# Patient Record
Sex: Female | Born: 1950
Health system: Southern US, Community
[De-identification: ages and names within clinical notes are randomized; demographics above are authoritative.]

## PROBLEM LIST (undated history)

## (undated) DIAGNOSIS — R011 Cardiac murmur, unspecified: Secondary | ICD-10-CM

## (undated) DIAGNOSIS — I10 Essential (primary) hypertension: Secondary | ICD-10-CM

## (undated) DIAGNOSIS — I480 Paroxysmal atrial fibrillation: Secondary | ICD-10-CM

## (undated) DIAGNOSIS — Z860100 Personal history of colon polyps, unspecified: Secondary | ICD-10-CM

## (undated) DIAGNOSIS — C189 Malignant neoplasm of colon, unspecified: Secondary | ICD-10-CM

## (undated) DIAGNOSIS — E785 Hyperlipidemia, unspecified: Secondary | ICD-10-CM

## (undated) DIAGNOSIS — R945 Abnormal results of liver function studies: Secondary | ICD-10-CM

## (undated) HISTORY — DX: Abnormal results of liver function studies: R94.5

## (undated) HISTORY — PX: COLON SURGERY: SHX602

## (undated) HISTORY — PX: APPENDECTOMY: SHX54

## (undated) HISTORY — DX: Paroxysmal atrial fibrillation: I48.0

## (undated) HISTORY — DX: Cardiac murmur, unspecified: R01.1

## (undated) HISTORY — DX: Essential (primary) hypertension: I10

## (undated) HISTORY — PX: CORONARY ANGIOPLASTY: SHX604

## (undated) HISTORY — DX: Hyperlipidemia, unspecified: E78.5

## (undated) HISTORY — DX: Malignant neoplasm of colon, unspecified: C18.9

---
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
## 1982-05-05 HISTORY — PX: ABDOMINAL HYSTERECTOMY: SHX81

## 1999-05-06 HISTORY — PX: COLONOSCOPY W/ POLYPECTOMY: SHX1380

## 2004-06-25 ENCOUNTER — Ambulatory Visit: Payer: Self-pay | Admitting: Internal Medicine

## 2005-08-27 ENCOUNTER — Ambulatory Visit: Payer: Self-pay | Admitting: Internal Medicine

## 2005-09-19 ENCOUNTER — Ambulatory Visit: Payer: Self-pay | Admitting: Oncology

## 2005-09-26 ENCOUNTER — Ambulatory Visit: Payer: Self-pay | Admitting: Oncology

## 2005-10-03 ENCOUNTER — Ambulatory Visit: Payer: Self-pay | Admitting: Oncology

## 2005-10-14 ENCOUNTER — Ambulatory Visit: Payer: Self-pay | Admitting: Gastroenterology

## 2005-11-02 ENCOUNTER — Ambulatory Visit: Payer: Self-pay | Admitting: Oncology

## 2005-12-03 ENCOUNTER — Inpatient Hospital Stay: Payer: Self-pay | Admitting: Surgery

## 2006-01-19 ENCOUNTER — Ambulatory Visit: Payer: Self-pay | Admitting: Oncology

## 2006-02-02 ENCOUNTER — Ambulatory Visit: Payer: Self-pay | Admitting: Oncology

## 2006-07-16 ENCOUNTER — Ambulatory Visit: Payer: Self-pay | Admitting: Oncology

## 2006-08-04 ENCOUNTER — Ambulatory Visit: Payer: Self-pay | Admitting: Oncology

## 2006-09-01 ENCOUNTER — Ambulatory Visit: Payer: Self-pay | Admitting: Internal Medicine

## 2007-01-04 ENCOUNTER — Ambulatory Visit: Payer: Self-pay | Admitting: Oncology

## 2007-01-14 ENCOUNTER — Ambulatory Visit: Payer: Self-pay | Admitting: Oncology

## 2007-02-03 ENCOUNTER — Ambulatory Visit: Payer: Self-pay | Admitting: Oncology

## 2007-07-04 ENCOUNTER — Ambulatory Visit: Payer: Self-pay | Admitting: Oncology

## 2007-07-12 ENCOUNTER — Ambulatory Visit: Payer: Self-pay | Admitting: Internal Medicine

## 2007-07-13 ENCOUNTER — Ambulatory Visit: Payer: Self-pay | Admitting: Oncology

## 2007-08-04 ENCOUNTER — Ambulatory Visit: Payer: Self-pay | Admitting: Oncology

## 2007-10-04 ENCOUNTER — Ambulatory Visit: Payer: Self-pay | Admitting: Oncology

## 2007-11-03 ENCOUNTER — Ambulatory Visit: Payer: Self-pay | Admitting: Oncology

## 2007-11-12 ENCOUNTER — Ambulatory Visit: Payer: Self-pay | Admitting: Internal Medicine

## 2007-12-13 ENCOUNTER — Ambulatory Visit: Payer: Self-pay | Admitting: Gastroenterology

## 2008-02-02 ENCOUNTER — Ambulatory Visit: Payer: Self-pay | Admitting: Oncology

## 2008-02-03 ENCOUNTER — Ambulatory Visit: Payer: Self-pay | Admitting: Oncology

## 2008-09-02 ENCOUNTER — Ambulatory Visit: Payer: Self-pay | Admitting: Oncology

## 2008-09-07 ENCOUNTER — Ambulatory Visit: Payer: Self-pay | Admitting: Oncology

## 2008-10-03 ENCOUNTER — Ambulatory Visit: Payer: Self-pay | Admitting: Oncology

## 2008-11-14 ENCOUNTER — Ambulatory Visit: Payer: Self-pay | Admitting: Internal Medicine

## 2009-03-05 ENCOUNTER — Ambulatory Visit: Payer: Self-pay | Admitting: Oncology

## 2009-03-15 ENCOUNTER — Ambulatory Visit: Payer: Self-pay | Admitting: Oncology

## 2009-04-04 ENCOUNTER — Ambulatory Visit: Payer: Self-pay | Admitting: Oncology

## 2009-09-02 ENCOUNTER — Ambulatory Visit: Payer: Self-pay | Admitting: Oncology

## 2009-09-12 ENCOUNTER — Ambulatory Visit: Payer: Self-pay | Admitting: Oncology

## 2009-10-03 ENCOUNTER — Ambulatory Visit: Payer: Self-pay | Admitting: Oncology

## 2009-11-16 ENCOUNTER — Ambulatory Visit: Payer: Self-pay | Admitting: Internal Medicine

## 2009-11-29 ENCOUNTER — Ambulatory Visit: Payer: Self-pay | Admitting: Internal Medicine

## 2010-01-18 ENCOUNTER — Ambulatory Visit: Payer: Self-pay | Admitting: Gastroenterology

## 2010-01-21 LAB — PATHOLOGY REPORT

## 2010-03-15 ENCOUNTER — Ambulatory Visit: Payer: Self-pay | Admitting: Oncology

## 2010-03-17 LAB — CEA: CEA: 4.4 ng/mL (ref 0.0–4.7)

## 2010-04-04 ENCOUNTER — Ambulatory Visit: Payer: Self-pay | Admitting: Oncology

## 2010-09-13 ENCOUNTER — Ambulatory Visit: Payer: Self-pay | Admitting: Oncology

## 2010-09-14 LAB — CEA: CEA: 5.1 ng/mL — ABNORMAL HIGH (ref 0.0–4.7)

## 2010-10-04 ENCOUNTER — Ambulatory Visit: Payer: Self-pay | Admitting: Oncology

## 2010-11-18 ENCOUNTER — Ambulatory Visit: Payer: Self-pay | Admitting: Internal Medicine

## 2010-11-18 LAB — HM MAMMOGRAPHY

## 2011-09-15 ENCOUNTER — Ambulatory Visit: Payer: Self-pay | Admitting: Oncology

## 2011-09-15 LAB — HEPATIC FUNCTION PANEL A (ARMC)
Albumin: 3.9 g/dL (ref 3.4–5.0)
Alkaline Phosphatase: 84 U/L (ref 50–136)
Bilirubin, Direct: 0.1 mg/dL (ref 0.00–0.20)
Bilirubin,Total: 0.5 mg/dL (ref 0.2–1.0)
SGOT(AST): 20 U/L (ref 15–37)
SGPT (ALT): 46 U/L
Total Protein: 7.2 g/dL (ref 6.4–8.2)

## 2011-09-16 LAB — CEA: CEA: 4.2 ng/mL (ref 0.0–4.7)

## 2011-10-04 ENCOUNTER — Ambulatory Visit: Payer: Self-pay | Admitting: Oncology

## 2011-11-19 ENCOUNTER — Ambulatory Visit: Payer: Self-pay | Admitting: Internal Medicine

## 2012-06-25 ENCOUNTER — Encounter: Payer: Self-pay | Admitting: *Deleted

## 2012-06-25 DIAGNOSIS — C189 Malignant neoplasm of colon, unspecified: Secondary | ICD-10-CM

## 2012-06-28 ENCOUNTER — Ambulatory Visit (INDEPENDENT_AMBULATORY_CARE_PROVIDER_SITE_OTHER): Payer: BC Managed Care – PPO | Admitting: Internal Medicine

## 2012-06-28 ENCOUNTER — Encounter: Payer: Self-pay | Admitting: Internal Medicine

## 2012-06-28 VITALS — BP 120/74 | HR 94 | Temp 98.2°F | Ht 63.0 in | Wt 167.2 lb

## 2012-06-28 DIAGNOSIS — I10 Essential (primary) hypertension: Secondary | ICD-10-CM

## 2012-06-28 DIAGNOSIS — D72819 Decreased white blood cell count, unspecified: Secondary | ICD-10-CM

## 2012-06-28 DIAGNOSIS — E7800 Pure hypercholesterolemia, unspecified: Secondary | ICD-10-CM

## 2012-06-28 DIAGNOSIS — K769 Liver disease, unspecified: Secondary | ICD-10-CM

## 2012-06-28 DIAGNOSIS — R945 Abnormal results of liver function studies: Secondary | ICD-10-CM

## 2012-07-05 ENCOUNTER — Other Ambulatory Visit (INDEPENDENT_AMBULATORY_CARE_PROVIDER_SITE_OTHER): Payer: BC Managed Care – PPO

## 2012-07-05 DIAGNOSIS — R945 Abnormal results of liver function studies: Secondary | ICD-10-CM

## 2012-07-05 DIAGNOSIS — I10 Essential (primary) hypertension: Secondary | ICD-10-CM

## 2012-07-05 DIAGNOSIS — K769 Liver disease, unspecified: Secondary | ICD-10-CM

## 2012-07-05 DIAGNOSIS — E7800 Pure hypercholesterolemia, unspecified: Secondary | ICD-10-CM

## 2012-07-05 LAB — HEPATIC FUNCTION PANEL
ALT: 30 U/L (ref 0–35)
AST: 23 U/L (ref 0–37)
Albumin: 4.1 g/dL (ref 3.5–5.2)
Alkaline Phosphatase: 59 U/L (ref 39–117)
Bilirubin, Direct: 0.1 mg/dL (ref 0.0–0.3)
Total Bilirubin: 1.2 mg/dL (ref 0.3–1.2)
Total Protein: 7.3 g/dL (ref 6.0–8.3)

## 2012-07-05 LAB — BASIC METABOLIC PANEL
BUN: 14 mg/dL (ref 6–23)
CO2: 26 mEq/L (ref 19–32)
Calcium: 9 mg/dL (ref 8.4–10.5)
Chloride: 103 mEq/L (ref 96–112)
Creatinine, Ser: 0.8 mg/dL (ref 0.4–1.2)
GFR: 97.48 mL/min (ref >60.00)
Glucose, Bld: 102 mg/dL — ABNORMAL HIGH (ref 70–99)
Potassium: 4.4 mEq/L (ref 3.5–5.1)
Sodium: 138 mEq/L (ref 135–145)

## 2012-07-05 LAB — LDL CHOLESTEROL, DIRECT: Direct LDL: 142 mg/dL

## 2012-07-05 LAB — LIPID PANEL
Cholesterol: 216 mg/dL — ABNORMAL HIGH (ref 0–200)
HDL: 54 mg/dL (ref >39.00)
Total CHOL/HDL Ratio: 4
Triglycerides: 102 mg/dL (ref 0.0–149.0)
VLDL: 20.4 mg/dL (ref 0.0–40.0)

## 2012-07-05 LAB — TSH: TSH: 1.12 u[IU]/mL (ref 0.35–5.50)

## 2012-07-07 ENCOUNTER — Other Ambulatory Visit: Payer: Self-pay | Admitting: Internal Medicine

## 2012-07-07 DIAGNOSIS — R945 Abnormal results of liver function studies: Secondary | ICD-10-CM

## 2012-07-07 DIAGNOSIS — E7800 Pure hypercholesterolemia, unspecified: Secondary | ICD-10-CM

## 2012-07-07 DIAGNOSIS — I10 Essential (primary) hypertension: Secondary | ICD-10-CM

## 2012-07-07 NOTE — Progress Notes (Signed)
Order placed for follow up labs 

## 2012-07-11 ENCOUNTER — Encounter: Payer: Self-pay | Admitting: Internal Medicine

## 2012-07-11 DIAGNOSIS — D72819 Decreased white blood cell count, unspecified: Secondary | ICD-10-CM | POA: Insufficient documentation

## 2012-07-11 DIAGNOSIS — I10 Essential (primary) hypertension: Secondary | ICD-10-CM | POA: Insufficient documentation

## 2012-07-11 DIAGNOSIS — E7800 Pure hypercholesterolemia, unspecified: Secondary | ICD-10-CM | POA: Insufficient documentation

## 2012-07-11 DIAGNOSIS — R945 Abnormal results of liver function studies: Secondary | ICD-10-CM | POA: Insufficient documentation

## 2012-07-11 NOTE — Progress Notes (Signed)
Subjective:    [name]: Susan Weeks, female    DOB: 1950-12-14, 62 y.o.   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  She comes in today to follow up on these issues as well as for a complete physical exam.  She states she is doing relatively well.  Previously went to acute care.  Respiratory symptoms have resolved.  Breathing stable.  No chest pain or tightness.  No acid reflux.  Bowels stable.  Seeing Dr Doylene Canning.  Has follow up in 5/14.  Seeing him q year now.     Past Medical History  Diagnosis Date  . Hypertension   . Hyperlipidemia   . Colon cancer     adenocarcinoma of the cecum s/p partial colon resection  . Abnormal liver function     Current Outpatient Prescriptions on File Prior to Visit  Medication Sig Dispense Refill  . amLODipine (NORVASC) 5 MG tablet Take 5 mg by mouth daily.      [name] lisinopril (PRINIVIL,ZESTRIL) 30 MG tablet Take 30 mg by mouth daily.      . vitamin E 400 UNIT capsule Take 400 Units by mouth 2 (two) times daily.      . calcium carbonate (OS-CAL) 600 MG TABS Take 600 mg by mouth 2 (two) times daily with a meal.       No current facility-administered medications on file prior to visit.    Review of Systems Patient denies any headache, lightheadedness or dizziness.  No significant sinus or allergy symptoms now.  No chest pain, tightness or palpitations.  No increased shortness of breath, cough or congestion.  No nausea or vomiting.  No acid reflux.  No abdominal pain or cramping.  No bowel change, such as diarrhea, constipation, BRBPR or melana.  No urine change.  Overall she feels she is doing relatively well.       Objective:   Physical Exam Filed Vitals:   06/28/12 1416  BP: 120/74  Pulse: 94  Temp: 98.2 F (36.8 C)   Blood pressure recheck:  122/78, pulse 68  62 year old female in no acute distress.   HEENT:  Nares-  clear.  Oropharynx - without lesions. NECK:  Supple.  Nontender.  No audible bruit.  HEART:  Appears to be regular. LUNGS:  No crackles or wheezing audible.  Respirations even and unlabored.  RADIAL PULSE:  Equal bilaterally.    BREASTS:  No nipple discharge or nipple retraction present.  Could not appreciate any distinct nodules or axillary adenopathy.  ABDOMEN:  Soft, nontender.  Bowel sounds present and normal.  No audible abdominal bruit.  GU:  Normal external genitalia.  Vaginal vault without lesions.  S/p hysterectomy.   Could not appreciate any adnexal masses or tenderness.   RECTAL:  Heme negative.   EXTREMITIES:  No increased edema present.  DP pulses palpable and equal bilaterally.          Assessment & Plan:  CARDIOVASCULAR.  Has a known murmur.  ECHO 09/01/06 revealed EF 55%, moderate mitral regurgitation and mild tricuspid insufficiency.  Asymptomatic.    HEALTH MAINTENANCE.  Physical today.  She is s/p hysterectomy.  Colonoscopy 01/18/10 - fair prep. Four 1-2 mm polyps in the rectosigmoid colon and one 2mm polyp in the rectum.  Pathology negative for dysplasia or malignancy.  Due follow up colonoscopy (per report) in 01/2013.  Mammogram 11/19/11 [facility].

## 2012-07-11 NOTE — Assessment & Plan Note (Signed)
CBC followed by Dr Choksi.  Obtain records.    

## 2012-07-11 NOTE — Assessment & Plan Note (Signed)
Low cholesterol diet and exercise.  Check lipid panel.   

## 2012-07-11 NOTE — Assessment & Plan Note (Signed)
Recheck liver panel.  

## 2012-07-11 NOTE — Assessment & Plan Note (Signed)
Blood pressure is doing well.  Same medication regimen.  Check metabolic panel.  

## 2012-07-15 ENCOUNTER — Other Ambulatory Visit: Payer: Self-pay | Admitting: *Deleted

## 2012-07-16 MED ORDER — AMLODIPINE BESYLATE 5 MG PO TABS: 5.0000 mg | ORAL_TABLET | Freq: Every day | ORAL | Status: DC

## 2012-07-16 NOTE — Telephone Encounter (Signed)
Sent in to pharmacy.  

## 2012-09-28 ENCOUNTER — Other Ambulatory Visit: Payer: Self-pay | Admitting: *Deleted

## 2012-09-28 ENCOUNTER — Telehealth: Payer: Self-pay | Admitting: *Deleted

## 2012-09-28 MED ORDER — LISINOPRIL 30 MG PO TABS: 30.0000 mg | ORAL_TABLET | Freq: Every day | ORAL | Status: DC

## 2012-09-28 NOTE — Telephone Encounter (Signed)
Lisinopril , patient states needs refill last OV 06/28/12

## 2012-09-28 NOTE — Telephone Encounter (Signed)
Lisinopril refilled x 6 mths-sent to [facility]

## 2012-10-03 ENCOUNTER — Ambulatory Visit: Payer: Self-pay | Admitting: Oncology

## 2012-10-21 LAB — CBC CANCER CENTER
Basophil #: 0 x10 3/mm (ref 0.0–0.1)
Basophil %: 0.9 %
Eosinophil #: 0.1 x10 3/mm (ref 0.0–0.7)
Eosinophil %: 1.1 %
HCT: 45.5 % (ref 35.0–47.0)
HGB: 15.5 g/dL (ref 12.0–16.0)
Lymphocyte #: 1.8 x10 3/mm (ref 1.0–3.6)
Lymphocyte %: 38.9 %
MCH: 29 pg (ref 26.0–34.0)
MCHC: 34 g/dL (ref 32.0–36.0)
MCV: 85 fL (ref 80–100)
Monocyte #: 0.3 x10 3/mm (ref 0.2–0.9)
Monocyte %: 6.8 %
Neutrophil #: 2.4 x10 3/mm (ref 1.4–6.5)
Neutrophil %: 52.3 %
Platelet: 228 x10 3/mm (ref 150–440)
RBC: 5.34 10*6/uL — ABNORMAL HIGH (ref 3.80–5.20)
RDW: 13.3 % (ref 11.5–14.5)
WBC: 4.5 x10 3/mm (ref 3.6–11.0)

## 2012-10-21 LAB — COMPREHENSIVE METABOLIC PANEL
Albumin: 4.1 g/dL (ref 3.4–5.0)
Alkaline Phosphatase: 102 U/L (ref 50–136)
Anion Gap: 6 — ABNORMAL LOW (ref 7–16)
BUN: 18 mg/dL (ref 7–18)
Bilirubin,Total: 0.6 mg/dL (ref 0.2–1.0)
Calcium, Total: 9.2 mg/dL (ref 8.5–10.1)
Chloride: 105 mmol/L (ref 98–107)
Co2: 31 mmol/L (ref 21–32)
Creatinine: 0.98 mg/dL (ref 0.60–1.30)
EGFR (African American): >60
EGFR (Non-African Amer.): >60
Glucose: 61 mg/dL — ABNORMAL LOW (ref 65–99)
Osmolality: 283 (ref 275–301)
Potassium: 4.2 mmol/L (ref 3.5–5.1)
SGOT(AST): 35 U/L (ref 15–37)
SGPT (ALT): 94 U/L — ABNORMAL HIGH (ref 12–78)
Sodium: 142 mmol/L (ref 136–145)
Total Protein: 7.8 g/dL (ref 6.4–8.2)

## 2012-10-22 LAB — CEA: CEA: 5.3 ng/mL — ABNORMAL HIGH (ref 0.0–4.7)

## 2012-11-01 ENCOUNTER — Other Ambulatory Visit (INDEPENDENT_AMBULATORY_CARE_PROVIDER_SITE_OTHER): Payer: BC Managed Care – PPO

## 2012-11-01 DIAGNOSIS — I10 Essential (primary) hypertension: Secondary | ICD-10-CM

## 2012-11-01 DIAGNOSIS — K769 Liver disease, unspecified: Secondary | ICD-10-CM

## 2012-11-01 DIAGNOSIS — E7800 Pure hypercholesterolemia, unspecified: Secondary | ICD-10-CM

## 2012-11-01 DIAGNOSIS — R945 Abnormal results of liver function studies: Secondary | ICD-10-CM

## 2012-11-01 LAB — LIPID PANEL
Cholesterol: 221 mg/dL — ABNORMAL HIGH (ref 0–200)
HDL: 58.4 mg/dL (ref >39.00)
Total CHOL/HDL Ratio: 4
Triglycerides: 97 mg/dL (ref 0.0–149.0)
VLDL: 19.4 mg/dL (ref 0.0–40.0)

## 2012-11-01 LAB — BASIC METABOLIC PANEL
BUN: 16 mg/dL (ref 6–23)
CO2: 23 mEq/L (ref 19–32)
Calcium: 9.1 mg/dL (ref 8.4–10.5)
Chloride: 107 mEq/L (ref 96–112)
Creatinine, Ser: 0.8 mg/dL (ref 0.4–1.2)
GFR: 89.3 mL/min (ref >60.00)
Glucose, Bld: 111 mg/dL — ABNORMAL HIGH (ref 70–99)
Potassium: 4.5 mEq/L (ref 3.5–5.1)
Sodium: 140 mEq/L (ref 135–145)

## 2012-11-01 LAB — HEPATIC FUNCTION PANEL
ALT: 52 U/L — ABNORMAL HIGH (ref 0–35)
AST: 30 U/L (ref 0–37)
Albumin: 4.1 g/dL (ref 3.5–5.2)
Alkaline Phosphatase: 65 U/L (ref 39–117)
Bilirubin, Direct: 0.1 mg/dL (ref 0.0–0.3)
Total Bilirubin: 0.8 mg/dL (ref 0.3–1.2)
Total Protein: 7 g/dL (ref 6.0–8.3)

## 2012-11-01 LAB — LDL CHOLESTEROL, DIRECT: Direct LDL: 144.2 mg/dL

## 2012-11-02 ENCOUNTER — Ambulatory Visit: Payer: Self-pay | Admitting: Oncology

## 2012-11-02 ENCOUNTER — Ambulatory Visit (INDEPENDENT_AMBULATORY_CARE_PROVIDER_SITE_OTHER): Payer: BC Managed Care – PPO | Admitting: Internal Medicine

## 2012-11-02 ENCOUNTER — Encounter: Payer: Self-pay | Admitting: Internal Medicine

## 2012-11-02 VITALS — BP 120/80 | HR 80 | Temp 98.4°F | Ht 63.0 in | Wt 167.0 lb

## 2012-11-02 DIAGNOSIS — R945 Abnormal results of liver function studies: Secondary | ICD-10-CM

## 2012-11-02 DIAGNOSIS — E7800 Pure hypercholesterolemia, unspecified: Secondary | ICD-10-CM

## 2012-11-02 DIAGNOSIS — D72819 Decreased white blood cell count, unspecified: Secondary | ICD-10-CM

## 2012-11-02 DIAGNOSIS — I10 Essential (primary) hypertension: Secondary | ICD-10-CM

## 2012-11-02 DIAGNOSIS — K769 Liver disease, unspecified: Secondary | ICD-10-CM

## 2012-11-02 DIAGNOSIS — R7989 Other specified abnormal findings of blood chemistry: Secondary | ICD-10-CM

## 2012-11-03 ENCOUNTER — Encounter: Payer: Self-pay | Admitting: Internal Medicine

## 2012-11-03 NOTE — Assessment & Plan Note (Signed)
Liver panel just checked and revealed a slightly elevated ALT (52).  Recheck liver panel to confirm stable.

## 2012-11-03 NOTE — Assessment & Plan Note (Signed)
Low cholesterol diet and exercise.  Lipid panel just checked and revealed total cholesterol 221, triglycerides 97, HDL 58 and LDL 144.  Had elevated liver function with previous statin.  Hold on medication.  Diet and exercise.  Follow.

## 2012-11-03 NOTE — Assessment & Plan Note (Signed)
CBC followed by Dr Choksi.  Obtain records.    

## 2012-11-03 NOTE — Progress Notes (Signed)
  Subjective:    [name]: Susan Weeks, female    DOB: 1950/09/01, 62 y.o.   MRN: [id number]  HPI 62 year old female with past history of hypertension, hypercholesterolemia and partial colon resection secondary to adenocarcinoma of the cecum.  She comes in today for a scheduled follow up.  She states she is doing relatively well.  Breathing stable. No increased cough or congestion.  No chest pain or tightness.  No acid reflux.  Bowels stable.  Is walking.  Just saw GI.  Is planning for f/u colonoscopy in 9/14.  Just saw Dr Doylene Canning.  States everything checked out fine.       Past Medical History  Diagnosis Date  . Hypertension   . Hyperlipidemia   . Colon cancer     adenocarcinoma of the cecum s/p partial colon resection  . Abnormal liver function     Current Outpatient Prescriptions on File Prior to Visit  Medication Sig Dispense Refill  . amLODipine (NORVASC) 5 MG tablet Take 1 tablet (5 mg total) by mouth daily.  30 tablet  5  . calcium carbonate (OS-CAL) 600 MG TABS Take 600 mg by mouth 2 (two) times daily with a meal.      . lisinopril (PRINIVIL,ZESTRIL) 30 MG tablet Take 1 tablet (30 mg total) by mouth daily.  30 tablet  5  . vitamin E 400 UNIT capsule Take 400 Units by mouth 2 (two) times daily.       No current facility-administered medications on file prior to visit.    Review of Systems Patient denies any headache, lightheadedness or dizziness.  No significant sinus or allergy symptoms now.  No chest pain, tightness or palpitations.  No increased shortness of breath, cough or congestion.  No nausea or vomiting.  No acid reflux.  No abdominal pain or cramping.  No bowel change, such as diarrhea, constipation, BRBPR or melana.  No urine change.  Overall she feels she is doing relatively well.       Objective:   Physical Exam  Filed Vitals:   11/02/12 1017  BP: 120/80  Pulse: 80  Temp: 98.4 F (36.9 C)   Pulse recheck 51  62 year old female in no acute distress.    HEENT:  Nares- clear.  Oropharynx - without lesions. NECK:  Supple.  Nontender.  No audible bruit.  HEART:  Appears to be regular. LUNGS:  No crackles or wheezing audible.  Respirations even and unlabored.  RADIAL PULSE:  Equal bilaterally. ABDOMEN:  Soft, nontender.  Bowel sounds present and normal.  No audible abdominal bruit.   EXTREMITIES:  No increased edema present.  DP pulses palpable and equal bilaterally.          Assessment & Plan:  CARDIOVASCULAR.  Has a known murmur.  ECHO 09/01/06 revealed EF 55%, moderate mitral regurgitation and mild tricuspid insufficiency.  Asymptomatic.    HEALTH MAINTENANCE.  Physical last visit.  She is s/p hysterectomy.  Colonoscopy 01/18/10 - fair prep. Four 1-2 mm polyps in the rectosigmoid colon and one 2mm polyp in the rectum.  Pathology negative for dysplasia or malignancy.  Due follow up colonoscopy (per report) in 01/2013.  Scheduled. Mammogram 11/19/11 [facility].  Scheduled for a follow up mammogram.

## 2012-11-03 NOTE — Assessment & Plan Note (Signed)
Blood pressure is doing well.  Same medication regimen.  Follow metabolic panel.  

## 2012-11-23 ENCOUNTER — Ambulatory Visit: Payer: Self-pay | Admitting: Internal Medicine

## 2012-11-23 LAB — HM MAMMOGRAPHY

## 2012-12-03 ENCOUNTER — Other Ambulatory Visit: Payer: BC Managed Care – PPO

## 2012-12-03 ENCOUNTER — Other Ambulatory Visit (INDEPENDENT_AMBULATORY_CARE_PROVIDER_SITE_OTHER): Payer: BC Managed Care – PPO

## 2012-12-03 DIAGNOSIS — R7989 Other specified abnormal findings of blood chemistry: Secondary | ICD-10-CM

## 2012-12-03 LAB — HEPATIC FUNCTION PANEL
ALT: 26 U/L (ref 0–35)
AST: 20 U/L (ref 0–37)
Albumin: 4.5 g/dL (ref 3.5–5.2)
Alkaline Phosphatase: 63 U/L (ref 39–117)
Bilirubin, Direct: 0.2 mg/dL (ref 0.0–0.3)
Indirect Bilirubin: 0.8 mg/dL (ref 0.0–0.9)
Total Bilirubin: 1 mg/dL (ref 0.3–1.2)
Total Protein: 7.1 g/dL (ref 6.0–8.3)

## 2012-12-04 ENCOUNTER — Other Ambulatory Visit: Payer: Self-pay | Admitting: Internal Medicine

## 2012-12-04 DIAGNOSIS — R945 Abnormal results of liver function studies: Secondary | ICD-10-CM

## 2012-12-04 DIAGNOSIS — R7989 Other specified abnormal findings of blood chemistry: Secondary | ICD-10-CM

## 2012-12-04 DIAGNOSIS — E7800 Pure hypercholesterolemia, unspecified: Secondary | ICD-10-CM

## 2012-12-04 DIAGNOSIS — I10 Essential (primary) hypertension: Secondary | ICD-10-CM

## 2012-12-04 NOTE — Progress Notes (Signed)
Order placed for f/u labs.  

## 2012-12-13 ENCOUNTER — Encounter: Payer: Self-pay | Admitting: Internal Medicine

## 2013-01-11 ENCOUNTER — Other Ambulatory Visit: Payer: Self-pay | Admitting: Internal Medicine

## 2013-01-11 NOTE — Telephone Encounter (Signed)
Eprescribed.

## 2013-01-21 ENCOUNTER — Ambulatory Visit: Payer: Self-pay | Admitting: Gastroenterology

## 2013-02-04 ENCOUNTER — Ambulatory Visit: Payer: Self-pay | Admitting: Gastroenterology

## 2013-02-15 DIAGNOSIS — Z85038 Personal history of other malignant neoplasm of large intestine: Secondary | ICD-10-CM | POA: Insufficient documentation

## 2013-03-01 ENCOUNTER — Encounter: Payer: Self-pay | Admitting: Internal Medicine

## 2013-03-02 ENCOUNTER — Other Ambulatory Visit (INDEPENDENT_AMBULATORY_CARE_PROVIDER_SITE_OTHER): Payer: BC Managed Care – PPO

## 2013-03-02 DIAGNOSIS — R7989 Other specified abnormal findings of blood chemistry: Secondary | ICD-10-CM

## 2013-03-02 DIAGNOSIS — E7800 Pure hypercholesterolemia, unspecified: Secondary | ICD-10-CM

## 2013-03-02 DIAGNOSIS — I10 Essential (primary) hypertension: Secondary | ICD-10-CM

## 2013-03-02 DIAGNOSIS — R945 Abnormal results of liver function studies: Secondary | ICD-10-CM

## 2013-03-02 LAB — BASIC METABOLIC PANEL
BUN: 20 mg/dL (ref 6–23)
CO2: 28 mEq/L (ref 19–32)
Calcium: 9.2 mg/dL (ref 8.4–10.5)
Chloride: 104 mEq/L (ref 96–112)
Creatinine, Ser: 0.8 mg/dL (ref 0.4–1.2)
GFR: 94.44 mL/min (ref >60.00)
Glucose, Bld: 103 mg/dL — ABNORMAL HIGH (ref 70–99)
Potassium: 4.7 mEq/L (ref 3.5–5.1)
Sodium: 140 mEq/L (ref 135–145)

## 2013-03-02 LAB — LIPID PANEL
Cholesterol: 223 mg/dL — ABNORMAL HIGH (ref 0–200)
HDL: 58.1 mg/dL (ref >39.00)
Total CHOL/HDL Ratio: 4
Triglycerides: 79 mg/dL (ref 0.0–149.0)
VLDL: 15.8 mg/dL (ref 0.0–40.0)

## 2013-03-02 LAB — HEPATIC FUNCTION PANEL
ALT: 36 U/L — ABNORMAL HIGH (ref 0–35)
AST: 23 U/L (ref 0–37)
Albumin: 4.2 g/dL (ref 3.5–5.2)
Alkaline Phosphatase: 61 U/L (ref 39–117)
Bilirubin, Direct: 0.1 mg/dL (ref 0.0–0.3)
Total Bilirubin: 0.9 mg/dL (ref 0.3–1.2)
Total Protein: 7.1 g/dL (ref 6.0–8.3)

## 2013-03-02 LAB — LDL CHOLESTEROL, DIRECT: Direct LDL: 150.8 mg/dL

## 2013-03-07 ENCOUNTER — Encounter: Payer: Self-pay | Admitting: Internal Medicine

## 2013-03-07 ENCOUNTER — Ambulatory Visit (INDEPENDENT_AMBULATORY_CARE_PROVIDER_SITE_OTHER): Payer: BC Managed Care – PPO | Admitting: Internal Medicine

## 2013-03-07 VITALS — BP 120/90 | HR 80 | Temp 98.5°F | Ht 63.0 in | Wt 169.0 lb

## 2013-03-07 DIAGNOSIS — K769 Liver disease, unspecified: Secondary | ICD-10-CM

## 2013-03-07 DIAGNOSIS — D72819 Decreased white blood cell count, unspecified: Secondary | ICD-10-CM

## 2013-03-07 DIAGNOSIS — R945 Abnormal results of liver function studies: Secondary | ICD-10-CM

## 2013-03-07 DIAGNOSIS — I10 Essential (primary) hypertension: Secondary | ICD-10-CM

## 2013-03-07 DIAGNOSIS — E7800 Pure hypercholesterolemia, unspecified: Secondary | ICD-10-CM

## 2013-03-07 DIAGNOSIS — C189 Malignant neoplasm of colon, unspecified: Secondary | ICD-10-CM

## 2013-03-12 ENCOUNTER — Encounter: Payer: Self-pay | Admitting: Internal Medicine

## 2013-03-12 NOTE — Assessment & Plan Note (Signed)
Liver panel just checked and revealed a slightly elevated ALT (36).  Follow liver panel to confirm stable.  Obtain records from previous abdominal ultrasound.

## 2013-03-12 NOTE — Assessment & Plan Note (Signed)
Low cholesterol diet and exercise.  Lipid panel just checked and revealed total cholesterol 223, triglycerides 79, HDL 58 and LDL 150.  Had elevated liver function with previous statin.  Hold on medication.  Diet and exercise.  Follow.

## 2013-03-12 NOTE — Assessment & Plan Note (Signed)
CBC followed by Dr Choksi.  Obtain records.    

## 2013-03-12 NOTE — Progress Notes (Signed)
  Subjective:    [name]: Susan Weeks, female    DOB: 1951-01-08, 62 y.o.   MRN: [id number]  HPI 62 year old female with past history of hypertension, hypercholesterolemia and partial colon resection secondary to adenocarcinoma of the cecum.  She comes in today for a scheduled follow up.  She states she is doing relatively well.  Breathing stable. No increased cough or congestion.  No chest pain or tightness.  No acid reflux.  Bowels stable.  Is walking.  Just saw GI.  Had colonoscopy in 10/3//14.  No polyps.  Recommended follow up colonoscopy in three years.  Sees Dr Doylene Canning.  States everything checked out fine.  Overall feels good.       Past Medical History  Diagnosis Date  . Hypertension   . Hyperlipidemia   . Colon cancer     adenocarcinoma of the cecum s/p partial colon resection  . Abnormal liver function     Current Outpatient Prescriptions on File Prior to Visit  Medication Sig Dispense Refill  . amLODipine (NORVASC) 5 MG tablet TAKE ONE TABLET BY MOUTH ONCE DAILY  30 tablet  3  . calcium carbonate (OS-CAL) 600 MG TABS Take 600 mg by mouth 2 (two) times daily with a meal.      . lisinopril (PRINIVIL,ZESTRIL) 30 MG tablet Take 1 tablet (30 mg total) by mouth daily.  30 tablet  5  . vitamin E 400 UNIT capsule Take 400 Units by mouth daily.        No current facility-administered medications on file prior to visit.    Review of Systems Patient denies any headache, lightheadedness or dizziness.  No significant sinus or allergy symptoms now.  No chest pain, tightness or palpitations.  No increased shortness of breath, cough or congestion.  No nausea or vomiting.  No acid reflux.  No abdominal pain or cramping.  No bowel change, such as diarrhea, constipation, BRBPR or melana.  No urine change.  Overall she feels she is doing relatively well.       Objective:   Physical Exam  Filed Vitals:   03/07/13 1634  BP: 120/90  Pulse: 80  Temp: 98.5 F (36.9 C)   Blood pressure  recheck:  7/17  62 year old female in no acute distress.   HEENT:  Nares- clear.  Oropharynx - without lesions. NECK:  Supple.  Nontender.  No audible bruit.  HEART:  Appears to be regular. LUNGS:  No crackles or wheezing audible.  Respirations even and unlabored.  RADIAL PULSE:  Equal bilaterally. ABDOMEN:  Soft, nontender.  Bowel sounds present and normal.  No audible abdominal bruit.   EXTREMITIES:  No increased edema present.  DP pulses palpable and equal bilaterally.          Assessment & Plan:  CARDIOVASCULAR.  Has a known murmur.  ECHO 09/01/06 revealed EF 55%, moderate mitral regurgitation and mild tricuspid insufficiency.  Asymptomatic.    HEALTH MAINTENANCE.  Physical 06/28/12.   She is s/p hysterectomy.  Colonoscopy 01/18/10 - fair prep. Four 1-2 mm polyps in the rectosigmoid colon and one 2mm polyp in the rectum.  Pathology negative for dysplasia or malignancy.  Follow up colonoscopy 02/04/13 - no polypd.  Recommended f/u colonoscopy in three years.   Mammogram 11/23/12 [facility] I.

## 2013-03-12 NOTE — Assessment & Plan Note (Signed)
Colonoscopy as outlined.  Followed by Dr Doylene Canning.

## 2013-03-12 NOTE — Assessment & Plan Note (Signed)
Blood pressure is doing well.  Same medication regimen.  Follow metabolic panel.  

## 2013-03-26 ENCOUNTER — Other Ambulatory Visit: Payer: Self-pay | Admitting: Internal Medicine

## 2013-03-29 ENCOUNTER — Other Ambulatory Visit: Payer: Self-pay | Admitting: Internal Medicine

## 2013-03-29 NOTE — Telephone Encounter (Signed)
Eprescribed.

## 2013-04-07 ENCOUNTER — Encounter: Payer: Self-pay | Admitting: Internal Medicine

## 2013-04-07 ENCOUNTER — Ambulatory Visit (INDEPENDENT_AMBULATORY_CARE_PROVIDER_SITE_OTHER): Payer: BC Managed Care – PPO | Admitting: Internal Medicine

## 2013-04-07 ENCOUNTER — Encounter: Payer: Self-pay | Admitting: *Deleted

## 2013-04-07 ENCOUNTER — Encounter (INDEPENDENT_AMBULATORY_CARE_PROVIDER_SITE_OTHER): Payer: Self-pay

## 2013-04-07 VITALS — BP 110/80 | HR 75 | Temp 98.5°F | Ht 63.0 in | Wt 166.5 lb

## 2013-04-07 DIAGNOSIS — M79609 Pain in unspecified limb: Secondary | ICD-10-CM

## 2013-04-07 DIAGNOSIS — I10 Essential (primary) hypertension: Secondary | ICD-10-CM

## 2013-04-07 DIAGNOSIS — M898X1 Other specified disorders of bone, shoulder: Secondary | ICD-10-CM

## 2013-04-07 NOTE — Progress Notes (Signed)
Pre-visit discussion using our clinic review tool. No additional management support is needed unless otherwise documented below in the visit note.  

## 2013-04-10 ENCOUNTER — Encounter: Payer: Self-pay | Admitting: Internal Medicine

## 2013-04-10 DIAGNOSIS — M898X1 Other specified disorders of bone, shoulder: Secondary | ICD-10-CM | POA: Insufficient documentation

## 2013-04-10 NOTE — Progress Notes (Signed)
Subjective:    [name]: Susan Weeks, female    DOB: 1950-12-28, 62 y.o.   MRN: [id number]  Neck Pain   62 year old female with past history of hypertension, hypercholesterolemia and partial colon resection secondary to adenocarcinoma of the cecum.  She comes in today as a work in with concerns regarding some pain over the right clavicle.   She states she is doing relatively well.  Breathing stable. No increased cough or congestion.  No chest pain or tightness.  No acid reflux.  Bowels stable.  Is walking. She has been doing a different job at work.  Lifting her arms and using them more.  Some discomfort in her right shoulder and neck.  Minimal.  She comes in because she noticed some tenderness to palpation over the right clavicle (proximal end).  Just notices the tenderness to palpation.  No rash.  No fever.  Otherwise doing well.  Has adjusted her diet.       Past Medical History  Diagnosis Date  . Hypertension   . Hyperlipidemia   . Colon cancer     adenocarcinoma of the cecum s/p partial colon resection  . Abnormal liver function     Current Outpatient Prescriptions on File Prior to Visit  Medication Sig Dispense Refill  . amLODipine (NORVASC) 5 MG tablet TAKE ONE TABLET BY MOUTH ONCE DAILY  30 tablet  3  . calcium carbonate (OS-CAL) 600 MG TABS Take 600 mg by mouth 2 (two) times daily with a meal.      . lisinopril (PRINIVIL,ZESTRIL) 30 MG tablet TAKE ONE TABLET BY MOUTH ONCE DAILY  30 tablet  5  . vitamin E 400 UNIT capsule Take 400 Units by mouth daily.        No current facility-administered medications on file prior to visit.    Review of Systems  Musculoskeletal: Positive for neck pain.  Patient denies any headache, lightheadedness or dizziness.  No significant sinus or allergy symptoms.   No chest pain, tightness or palpitations.  No increased shortness of breath, cough or congestion.  No nausea or vomiting.  No acid reflux.  No abdominal pain or cramping.  Pain over the  right clavicle as outlined.       Objective:   Physical Exam  Filed Vitals:   04/07/13 1454  BP: 110/80  Pulse: 75  Temp: 98.5 F (36.9 C)   Blood pressure recheck:  24/59  62 year old female in no acute distress.   HEENT:  Nares- clear.  Oropharynx - without lesions. NECK:  Supple.  Nontender.  No audible bruit.  HEART:  Appears to be regular. LUNGS:  No crackles or wheezing audible.  Respirations even and unlabored.  RADIAL PULSE:  Equal bilaterally. MSK:  Minimal discomfort with raising her arm - abduction and full extension.  Minimal.  No limited rom.  Minimal tenderness to palpation over the proximal end of the clavicle.  No increased erythema or warmth.           Assessment & Plan:  CARDIOVASCULAR.  Has a known murmur.  ECHO 09/01/06 revealed EF 55%, moderate mitral regurgitation and mild tricuspid insufficiency.  Asymptomatic.    HEALTH MAINTENANCE.  Physical 06/28/12.   She is s/p hysterectomy.  Colonoscopy 01/18/10 - fair prep. Four 1-2 mm polyps in the rectosigmoid colon and one 2mm polyp in the rectum.  Pathology negative for dysplasia or malignancy.  Follow up colonoscopy 02/04/13 - no polypd.  Recommended f/u colonoscopy in three years.  Mammogram 11/23/12 [facility] I.

## 2013-04-10 NOTE — Assessment & Plan Note (Signed)
Blood pressure is doing well.  Same medication regimen.  Follow metabolic panel.  

## 2013-04-10 NOTE — Assessment & Plan Note (Signed)
No known injury or trauma.  Minimal pain with palpation.  See exam.  Tylenol as directed.  She will call with update over the next 7-10 days.  If persistent pain, will need further w/up and evaluation.

## 2013-05-17 ENCOUNTER — Other Ambulatory Visit: Payer: Self-pay | Admitting: Internal Medicine

## 2013-07-04 ENCOUNTER — Other Ambulatory Visit (INDEPENDENT_AMBULATORY_CARE_PROVIDER_SITE_OTHER): Payer: BC Managed Care – PPO

## 2013-07-04 ENCOUNTER — Telehealth: Payer: Self-pay | Admitting: *Deleted

## 2013-07-04 DIAGNOSIS — E7800 Pure hypercholesterolemia, unspecified: Secondary | ICD-10-CM

## 2013-07-04 DIAGNOSIS — R945 Abnormal results of liver function studies: Secondary | ICD-10-CM

## 2013-07-04 DIAGNOSIS — I10 Essential (primary) hypertension: Secondary | ICD-10-CM

## 2013-07-04 DIAGNOSIS — K769 Liver disease, unspecified: Secondary | ICD-10-CM

## 2013-07-04 DIAGNOSIS — D72819 Decreased white blood cell count, unspecified: Secondary | ICD-10-CM

## 2013-07-04 NOTE — Telephone Encounter (Signed)
What labs and dx?  

## 2013-07-05 ENCOUNTER — Telehealth: Payer: Self-pay | Admitting: *Deleted

## 2013-07-05 DIAGNOSIS — C189 Malignant neoplasm of colon, unspecified: Secondary | ICD-10-CM

## 2013-07-05 DIAGNOSIS — E7800 Pure hypercholesterolemia, unspecified: Secondary | ICD-10-CM

## 2013-07-05 DIAGNOSIS — R945 Abnormal results of liver function studies: Secondary | ICD-10-CM

## 2013-07-05 DIAGNOSIS — D72819 Decreased white blood cell count, unspecified: Secondary | ICD-10-CM

## 2013-07-05 DIAGNOSIS — I10 Essential (primary) hypertension: Secondary | ICD-10-CM

## 2013-07-05 LAB — CBC WITH DIFFERENTIAL/PLATELET
Basophils Absolute: 0 10*3/uL (ref 0.0–0.1)
Basophils Relative: 0.2 % (ref 0.0–3.0)
Eosinophils Absolute: 0 10*3/uL (ref 0.0–0.7)
Eosinophils Relative: 0.8 % (ref 0.0–5.0)
HCT: 46.7 % — ABNORMAL HIGH (ref 36.0–46.0)
Hemoglobin: 15.5 g/dL — ABNORMAL HIGH (ref 12.0–15.0)
Lymphocytes Relative: 39.4 % (ref 12.0–46.0)
Lymphs Abs: 1.7 10*3/uL (ref 0.7–4.0)
MCHC: 33.2 g/dL (ref 30.0–36.0)
MCV: 88 fl (ref 78.0–100.0)
Monocytes Absolute: 0.2 10*3/uL (ref 0.1–1.0)
Monocytes Relative: 5.5 % (ref 3.0–12.0)
Neutro Abs: 2.4 10*3/uL (ref 1.4–7.7)
Neutrophils Relative %: 54.1 % (ref 43.0–77.0)
Platelets: 278 10*3/uL (ref 150.0–400.0)
RBC: 5.3 Mil/uL — ABNORMAL HIGH (ref 3.87–5.11)
RDW: 13.7 % (ref 11.5–14.6)
WBC: 4.4 10*3/uL — ABNORMAL LOW (ref 4.5–10.5)

## 2013-07-05 LAB — LIPID PANEL
Cholesterol: 213 mg/dL — ABNORMAL HIGH (ref 0–200)
HDL: 53.3 mg/dL (ref >39.00)
LDL Cholesterol: 137 mg/dL — ABNORMAL HIGH (ref 0–99)
Total CHOL/HDL Ratio: 4
Triglycerides: 114 mg/dL (ref 0.0–149.0)
VLDL: 22.8 mg/dL (ref 0.0–40.0)

## 2013-07-05 LAB — HEPATIC FUNCTION PANEL
ALT: 43 U/L — ABNORMAL HIGH (ref 0–35)
AST: 22 U/L (ref 0–37)
Albumin: 4.2 g/dL (ref 3.5–5.2)
Alkaline Phosphatase: 66 U/L (ref 39–117)
Bilirubin, Direct: 0.1 mg/dL (ref 0.0–0.3)
Total Bilirubin: 0.9 mg/dL (ref 0.3–1.2)
Total Protein: 7 g/dL (ref 6.0–8.3)

## 2013-07-05 LAB — BASIC METABOLIC PANEL
BUN: 16 mg/dL (ref 6–23)
CO2: 29 mEq/L (ref 19–32)
Calcium: 9.4 mg/dL (ref 8.4–10.5)
Chloride: 104 mEq/L (ref 96–112)
Creatinine, Ser: 0.8 mg/dL (ref 0.4–1.2)
GFR: 87.89 mL/min (ref >60.00)
Glucose, Bld: 85 mg/dL (ref 70–99)
Potassium: 5 mEq/L (ref 3.5–5.1)
Sodium: 139 mEq/L (ref 135–145)

## 2013-07-05 LAB — TSH: TSH: 1.45 u[IU]/mL (ref 0.35–5.50)

## 2013-07-05 NOTE — Telephone Encounter (Signed)
What labs and dx?  

## 2013-07-05 NOTE — Telephone Encounter (Signed)
Orders placed for labs

## 2013-07-06 ENCOUNTER — Encounter: Payer: Self-pay | Admitting: *Deleted

## 2013-07-20 ENCOUNTER — Other Ambulatory Visit: Payer: BC Managed Care – PPO

## 2013-07-25 ENCOUNTER — Ambulatory Visit (INDEPENDENT_AMBULATORY_CARE_PROVIDER_SITE_OTHER): Payer: BC Managed Care – PPO | Admitting: Internal Medicine

## 2013-07-25 ENCOUNTER — Encounter: Payer: Self-pay | Admitting: Internal Medicine

## 2013-07-25 VITALS — BP 110/70 | HR 85 | Temp 98.1°F | Ht 63.0 in | Wt 168.2 lb

## 2013-07-25 DIAGNOSIS — K769 Liver disease, unspecified: Secondary | ICD-10-CM

## 2013-07-25 DIAGNOSIS — D72819 Decreased white blood cell count, unspecified: Secondary | ICD-10-CM

## 2013-07-25 DIAGNOSIS — E7800 Pure hypercholesterolemia, unspecified: Secondary | ICD-10-CM

## 2013-07-25 DIAGNOSIS — I10 Essential (primary) hypertension: Secondary | ICD-10-CM

## 2013-07-25 DIAGNOSIS — C189 Malignant neoplasm of colon, unspecified: Secondary | ICD-10-CM

## 2013-07-25 DIAGNOSIS — R945 Abnormal results of liver function studies: Secondary | ICD-10-CM

## 2013-07-25 NOTE — Assessment & Plan Note (Addendum)
Colonoscopy as outlined.  Followed by Dr Choksi.  Recommended f/u colonoscopy in three years.  Last 02/04/13.    

## 2013-07-25 NOTE — Assessment & Plan Note (Addendum)
Low cholesterol diet and exercise.  Lipid panel just checked and revealed total cholesterol 213, triglycerides 114, HDL 53 and LDL 137.  Had elevated liver function with previous statin.  Hold on medication.  Diet and exercise.  Cholesterol slightly improved.  Follow.

## 2013-07-25 NOTE — Assessment & Plan Note (Addendum)
Liver panel just checked and revealed a slightly elevated ALT (43).  Follow liver panel to confirm stable.  Schedule a f/u abdominal ultrasound.

## 2013-07-25 NOTE — Progress Notes (Signed)
Subjective:    [name]: Susan Weeks, female    DOB: May 17, 1950, 63 y.o.   MRN: [id number]  HPI 63 year old female with past history of hypertension, hypercholesterolemia and partial colon resection secondary to adenocarcinoma of the cecum.  She comes in today to follow up on these issues as well as for a complete physical exam.   She states she is doing relatively well.  Breathing stable.  No increased cough or congestion.  No chest pain or tightness.  No acid reflux.  Bowels stable.  Is walking.  Just saw GI.  Had colonoscopy in 10/3//14.  No polyps.  Recommended follow up colonoscopy in three years.  Sees Dr Oliva Bustard.  Last seen in May 2014.  Overall feels good.  Blood pressure has been doing well.       Past Medical History  Diagnosis Date  . Hypertension   . Hyperlipidemia   . Colon cancer     adenocarcinoma of the cecum s/p partial colon resection  . Abnormal liver function     Current Outpatient Prescriptions on File Prior to Visit  Medication Sig Dispense Refill  . amLODipine (NORVASC) 5 MG tablet TAKE ONE TABLET BY MOUTH ONCE DAILY  30 tablet  5  . calcium carbonate (OS-CAL) 600 MG TABS Take 600 mg by mouth 2 (two) times daily with a meal.      . lisinopril (PRINIVIL,ZESTRIL) 30 MG tablet TAKE ONE TABLET BY MOUTH ONCE DAILY  30 tablet  5  . vitamin E 400 UNIT capsule Take 400 Units by mouth daily.        No current facility-administered medications on file prior to visit.    Review of Systems Patient denies any headache, lightheadedness or dizziness.  No significant sinus or allergy symptoms now.  No chest pain, tightness or palpitations.  No increased shortness of breath, cough or congestion.  No nausea or vomiting.  No acid reflux.  No abdominal pain or cramping.  No bowel change, such as diarrhea, constipation, BRBPR or melana.  No urine change.  Overall she feels she is doing relatively well.       Objective:   Physical Exam  Filed Vitals:   07/25/13 1335  BP:  110/70  Pulse: 85  Temp: 98.1 F (36.7 C)   Blood pressure recheck:   3/82  63 year old female in no acute distress.   HEENT:  Nares- clear.  Oropharynx - without lesions. NECK:  Supple.  Nontender.  No audible bruit.  HEART:  Appears to be regular. LUNGS:  No crackles or wheezing audible.  Respirations even and unlabored.  RADIAL PULSE:  Equal bilaterally.    BREASTS:  No nipple discharge or nipple retraction present.  Could not appreciate any distinct nodules or axillary adenopathy.  ABDOMEN:  Soft, nontender.  Bowel sounds present and normal.  No audible abdominal bruit.  GU:  Not performed.    EXTREMITIES:  No increased edema present.  DP pulses palpable and equal bilaterally.         Assessment & Plan:  CARDIOVASCULAR.  Has a known murmur.  ECHO 09/01/06 revealed EF 55%, moderate mitral regurgitation and mild tricuspid insufficiency.  Asymptomatic.    HEALTH MAINTENANCE.  Physical today.   She is s/p hysterectomy.  Colonoscopy 01/18/10 - fair prep. Four 1-2 mm polyps in the rectosigmoid colon and one 43mm polyp in the rectum.  Pathology negative for dysplasia or malignancy.  Follow up colonoscopy 02/04/13 - no polyp.  Recommended f/u colonoscopy  in three years.   Mammogram 11/23/12 [facility] I.

## 2013-07-25 NOTE — Assessment & Plan Note (Addendum)
Blood pressure is doing well.  Same medication regimen.  Follow metabolic panel.  

## 2013-07-25 NOTE — Progress Notes (Signed)
Pre-visit discussion using our clinic review tool. No additional management support is needed unless otherwise documented below in the visit note.  

## 2013-07-25 NOTE — Assessment & Plan Note (Addendum)
CBC followed by Dr Oliva Bustard.  Obtain records.

## 2013-07-30 ENCOUNTER — Encounter: Payer: Self-pay | Admitting: Internal Medicine

## 2013-08-05 ENCOUNTER — Ambulatory Visit: Payer: Self-pay | Admitting: Internal Medicine

## 2013-08-17 ENCOUNTER — Encounter: Payer: Self-pay | Admitting: Internal Medicine

## 2013-08-17 ENCOUNTER — Telehealth: Payer: Self-pay | Admitting: *Deleted

## 2013-08-17 DIAGNOSIS — R945 Abnormal results of liver function studies: Secondary | ICD-10-CM

## 2013-08-17 NOTE — Telephone Encounter (Signed)
Left voicemail to notify patient that her Abdominal U/S from 08/05/13 was normal.

## 2013-10-21 ENCOUNTER — Ambulatory Visit: Payer: Self-pay | Admitting: Oncology

## 2013-10-24 LAB — CBC CANCER CENTER
Basophil #: 0.1 x10 3/mm (ref 0.0–0.1)
Basophil %: 1.1 %
Eosinophil #: 0.1 x10 3/mm (ref 0.0–0.7)
Eosinophil %: 1.3 %
HCT: 45 % (ref 35.0–47.0)
HGB: 15.3 g/dL (ref 12.0–16.0)
Lymphocyte #: 1.8 x10 3/mm (ref 1.0–3.6)
Lymphocyte %: 37.4 %
MCH: 29.5 pg (ref 26.0–34.0)
MCHC: 33.9 g/dL (ref 32.0–36.0)
MCV: 87 fL (ref 80–100)
Monocyte #: 0.3 x10 3/mm (ref 0.2–0.9)
Monocyte %: 5.9 %
Neutrophil #: 2.6 x10 3/mm (ref 1.4–6.5)
Neutrophil %: 54.3 %
Platelet: 231 x10 3/mm (ref 150–440)
RBC: 5.17 10*6/uL (ref 3.80–5.20)
RDW: 13.1 % (ref 11.5–14.5)
WBC: 4.8 x10 3/mm (ref 3.6–11.0)

## 2013-10-24 LAB — COMPREHENSIVE METABOLIC PANEL
Albumin: 4 g/dL (ref 3.4–5.0)
Alkaline Phosphatase: 80 U/L
Anion Gap: 7 (ref 7–16)
BUN: 19 mg/dL — ABNORMAL HIGH (ref 7–18)
Bilirubin,Total: 0.4 mg/dL (ref 0.2–1.0)
Calcium, Total: 9.4 mg/dL (ref 8.5–10.1)
Chloride: 104 mmol/L (ref 98–107)
Co2: 30 mmol/L (ref 21–32)
Creatinine: 0.96 mg/dL (ref 0.60–1.30)
EGFR (African American): >60
EGFR (Non-African Amer.): >60
Glucose: 97 mg/dL (ref 65–99)
Osmolality: 283 (ref 275–301)
Potassium: 4.1 mmol/L (ref 3.5–5.1)
SGOT(AST): 20 U/L (ref 15–37)
SGPT (ALT): 49 U/L (ref 12–78)
Sodium: 141 mmol/L (ref 136–145)
Total Protein: 7.7 g/dL (ref 6.4–8.2)

## 2013-10-25 LAB — CANCER ANTIGEN 27.29: CA 27.29: 20.1 U/mL (ref 0.0–38.6)

## 2013-10-27 ENCOUNTER — Other Ambulatory Visit: Payer: Self-pay | Admitting: Internal Medicine

## 2013-10-28 ENCOUNTER — Telehealth: Payer: Self-pay | Admitting: Internal Medicine

## 2013-10-28 ENCOUNTER — Telehealth: Payer: Self-pay

## 2013-10-28 ENCOUNTER — Other Ambulatory Visit: Payer: Self-pay | Admitting: *Deleted

## 2013-10-28 DIAGNOSIS — Z1239 Encounter for other screening for malignant neoplasm of breast: Secondary | ICD-10-CM

## 2013-10-28 MED ORDER — AMLODIPINE BESYLATE 5 MG PO TABS: ORAL_TABLET | ORAL | Status: DC

## 2013-10-28 MED ORDER — LISINOPRIL 30 MG PO TABS: ORAL_TABLET | ORAL | Status: DC

## 2013-10-28 NOTE — Telephone Encounter (Signed)
lisinopril (PRINIVIL,ZESTRIL) 30 MG tablet  amLODipine (NORVASC) 5 MG tablet

## 2013-10-28 NOTE — Telephone Encounter (Signed)
Order placed for mammogram with notation to schedule for 11/30/13.

## 2013-10-28 NOTE — Telephone Encounter (Signed)
The patient called and is hoping to schedule a mammogram for July 29th.  Can the order be placed in her chart?   Thanks!

## 2013-10-28 NOTE — Telephone Encounter (Signed)
Rx refilled until September

## 2013-10-31 LAB — CEA: CEA: 5 ng/mL — ABNORMAL HIGH (ref 0.0–4.7)

## 2013-11-02 ENCOUNTER — Other Ambulatory Visit (INDEPENDENT_AMBULATORY_CARE_PROVIDER_SITE_OTHER): Payer: BC Managed Care – PPO

## 2013-11-02 ENCOUNTER — Ambulatory Visit: Payer: Self-pay | Admitting: Oncology

## 2013-11-02 DIAGNOSIS — R945 Abnormal results of liver function studies: Secondary | ICD-10-CM

## 2013-11-02 DIAGNOSIS — E7800 Pure hypercholesterolemia, unspecified: Secondary | ICD-10-CM

## 2013-11-02 DIAGNOSIS — K769 Liver disease, unspecified: Secondary | ICD-10-CM

## 2013-11-02 DIAGNOSIS — I10 Essential (primary) hypertension: Secondary | ICD-10-CM

## 2013-11-02 LAB — BASIC METABOLIC PANEL
BUN: 16 mg/dL (ref 6–23)
CO2: 26 mEq/L (ref 19–32)
Calcium: 9.3 mg/dL (ref 8.4–10.5)
Chloride: 106 mEq/L (ref 96–112)
Creatinine, Ser: 0.7 mg/dL (ref 0.4–1.2)
GFR: 101.62 mL/min (ref >60.00)
Glucose, Bld: 106 mg/dL — ABNORMAL HIGH (ref 70–99)
Potassium: 4 mEq/L (ref 3.5–5.1)
Sodium: 141 mEq/L (ref 135–145)

## 2013-11-02 LAB — HEPATIC FUNCTION PANEL
ALT: 46 U/L — ABNORMAL HIGH (ref 0–35)
AST: 32 U/L (ref 0–37)
Albumin: 4.2 g/dL (ref 3.5–5.2)
Alkaline Phosphatase: 64 U/L (ref 39–117)
Bilirubin, Direct: 0.1 mg/dL (ref 0.0–0.3)
Total Bilirubin: 0.7 mg/dL (ref 0.2–1.2)
Total Protein: 7.1 g/dL (ref 6.0–8.3)

## 2013-11-02 LAB — LIPID PANEL
Cholesterol: 243 mg/dL — ABNORMAL HIGH (ref 0–200)
HDL: 65.3 mg/dL (ref >39.00)
LDL Cholesterol: 159 mg/dL — ABNORMAL HIGH (ref 0–99)
NonHDL: 177.7
Total CHOL/HDL Ratio: 4
Triglycerides: 94 mg/dL (ref 0.0–149.0)
VLDL: 18.8 mg/dL (ref 0.0–40.0)

## 2013-11-03 ENCOUNTER — Encounter: Payer: Self-pay | Admitting: *Deleted

## 2013-11-25 ENCOUNTER — Ambulatory Visit: Payer: BC Managed Care – PPO | Admitting: Internal Medicine

## 2013-11-28 ENCOUNTER — Ambulatory Visit: Payer: Self-pay | Admitting: Internal Medicine

## 2013-11-28 LAB — HM MAMMOGRAPHY: HM Mammogram: NEGATIVE

## 2013-11-29 ENCOUNTER — Encounter: Payer: Self-pay | Admitting: Internal Medicine

## 2014-02-07 ENCOUNTER — Ambulatory Visit (INDEPENDENT_AMBULATORY_CARE_PROVIDER_SITE_OTHER): Payer: BC Managed Care – PPO | Admitting: Internal Medicine

## 2014-02-07 ENCOUNTER — Encounter: Payer: Self-pay | Admitting: Internal Medicine

## 2014-02-07 VITALS — BP 102/80 | HR 90 | Temp 98.6°F | Ht 63.0 in | Wt 172.5 lb

## 2014-02-07 DIAGNOSIS — I10 Essential (primary) hypertension: Secondary | ICD-10-CM

## 2014-02-07 DIAGNOSIS — C189 Malignant neoplasm of colon, unspecified: Secondary | ICD-10-CM

## 2014-02-07 DIAGNOSIS — K7689 Other specified diseases of liver: Secondary | ICD-10-CM

## 2014-02-07 DIAGNOSIS — E7800 Pure hypercholesterolemia, unspecified: Secondary | ICD-10-CM

## 2014-02-07 DIAGNOSIS — R945 Abnormal results of liver function studies: Secondary | ICD-10-CM

## 2014-02-07 DIAGNOSIS — R252 Cramp and spasm: Secondary | ICD-10-CM

## 2014-02-07 DIAGNOSIS — D72819 Decreased white blood cell count, unspecified: Secondary | ICD-10-CM

## 2014-02-07 NOTE — Progress Notes (Signed)
Pre visit review using our clinic review tool, if applicable. No additional management support is needed unless otherwise documented below in the visit note. 

## 2014-02-08 ENCOUNTER — Other Ambulatory Visit: Payer: Self-pay | Admitting: Internal Medicine

## 2014-02-08 DIAGNOSIS — I10 Essential (primary) hypertension: Secondary | ICD-10-CM

## 2014-02-08 LAB — BASIC METABOLIC PANEL
BUN: 20 mg/dL (ref 6–23)
CO2: 27 mEq/L (ref 19–32)
Calcium: 9.5 mg/dL (ref 8.4–10.5)
Chloride: 104 mEq/L (ref 96–112)
Creatinine, Ser: 1.1 mg/dL (ref 0.4–1.2)
GFR: 63.59 mL/min (ref >60.00)
Glucose, Bld: 87 mg/dL (ref 70–99)
Potassium: 4.8 mEq/L (ref 3.5–5.1)
Sodium: 139 mEq/L (ref 135–145)

## 2014-02-08 LAB — MAGNESIUM: Magnesium: 2.4 mg/dL (ref 1.5–2.5)

## 2014-02-08 NOTE — Progress Notes (Signed)
Order placed for f/u lab.   

## 2014-02-09 ENCOUNTER — Encounter: Payer: Self-pay | Admitting: *Deleted

## 2014-02-10 ENCOUNTER — Other Ambulatory Visit: Payer: Self-pay | Admitting: Internal Medicine

## 2014-02-12 ENCOUNTER — Encounter: Payer: Self-pay | Admitting: Internal Medicine

## 2014-02-12 DIAGNOSIS — R252 Cramp and spasm: Secondary | ICD-10-CM | POA: Insufficient documentation

## 2014-02-12 NOTE — Assessment & Plan Note (Signed)
Colonoscopy as outlined.  Followed by Dr Oliva Bustard.  Recommended f/u colonoscopy in three years.  Last 02/04/13.

## 2014-02-12 NOTE — Assessment & Plan Note (Signed)
Low cholesterol diet and exercise.  Lipid panel just checked and revealed total cholesterol 243, triglycerides 94, HDL 65 and LDL 159.  Had elevated liver function with previous statin.  Hold on medication.  Diet and exercise.  Follow.

## 2014-02-12 NOTE — Assessment & Plan Note (Signed)
Liver panel just checked and revealed a slightly elevated ALT (46).  Follow liver panel to confirm stable.  Abdominal ultrasound 08/05/13 - negative.

## 2014-02-12 NOTE — Assessment & Plan Note (Signed)
Blood pressure is doing well.  Same medication regimen.  Follow metabolic panel.  

## 2014-02-12 NOTE — Assessment & Plan Note (Signed)
CBC followed by Dr Oliva Bustard.

## 2014-02-12 NOTE — Assessment & Plan Note (Signed)
Stay hydrated.  Stretches.  Support hose.  Support shoes.  Check electrolytes, calcium and magnesium.

## 2014-02-12 NOTE — Progress Notes (Signed)
  Subjective:    [name]: Susan Weeks, female    DOB: 1950/06/28, 63 y.o.   MRN: [id number]  HPI 63 year old female with past history of hypertension, hypercholesterolemia and partial colon resection secondary to adenocarcinoma of the cecum.  She comes in today for a scheduled follow up.  She states she is doing relatively well.  Breathing stable.  No increased cough or congestion.  No chest pain or tightness.  No acid reflux.  Bowels stable.  Is walking.  Followed by GI.  Had colonoscopy in 10/3//14.  No polyps.  Recommended follow up colonoscopy in three years.  Sees Dr Oliva Bustard.  Overall feels good.  Blood pressure has been doing well.  Has noticed cramps in her thighs.  Had 2-3 days last week.  Discussed stretches and staying hydrated.  No other leg pain.       Past Medical History  Diagnosis Date  . Hypertension   . Hyperlipidemia   . Colon cancer     adenocarcinoma of the cecum s/p partial colon resection  . Abnormal liver function     Current Outpatient Prescriptions on File Prior to Visit  Medication Sig Dispense Refill  . calcium carbonate (OS-CAL) 600 MG TABS Take 600 mg by mouth 2 (two) times daily with a meal.      . lisinopril (PRINIVIL,ZESTRIL) 30 MG tablet TAKE ONE TABLET BY MOUTH ONCE DAILY  30 tablet  2  . vitamin E 400 UNIT capsule Take 400 Units by mouth daily.        No current facility-administered medications on file prior to visit.    Review of Systems Patient denies any headache, lightheadedness or dizziness.  No significant sinus or allergy symptoms now.  No chest pain, tightness or palpitations.  No increased shortness of breath, cough or congestion.  No nausea or vomiting.  No acid reflux.  No abdominal pain or cramping.  No bowel change, such as diarrhea, constipation, BRBPR or melana.  No urine change.  Overall she feels she is doing relatively well.  Thigh cramps as outlined.   Not constant.       Objective:   Physical Exam  Filed Vitals:   02/07/14  1515  BP: 102/80  Pulse: 90  Temp: 98.6 F (37 C)   Blood pressure recheck:   17/80  63 year old female in no acute distress.   HEENT:  Nares- clear.  Oropharynx - without lesions. NECK:  Supple.  Nontender.  No audible bruit.  HEART:  Appears to be regular. LUNGS:  No crackles or wheezing audible.  Respirations even and unlabored.  RADIAL PULSE:  Equal bilaterally.   ABDOMEN:  Soft, nontender.  Bowel sounds present and normal.  No audible abdominal bruit.    EXTREMITIES:  No increased edema present.  DP pulses palpable and equal bilaterally.     MSK:  No pain to palpation over her legs.       Assessment & Plan:  CARDIOVASCULAR.  Has a known murmur.  ECHO 09/01/06 revealed EF 55%, moderate mitral regurgitation and mild tricuspid insufficiency.  Asymptomatic.    HEALTH MAINTENANCE.  Physical 07/25/13.    She is s/p hysterectomy.  Colonoscopy 01/18/10 - fair prep. Four 1-2 mm polyps in the rectosigmoid colon and one 63mm polyp in the rectum.  Pathology negative for dysplasia or malignancy.  Follow up colonoscopy 02/04/13 - no polyp.  Recommended f/u colonoscopy in three years.   Mammogram 11/28/13- Birads I.

## 2014-02-15 ENCOUNTER — Other Ambulatory Visit: Payer: Self-pay | Admitting: Internal Medicine

## 2014-03-01 ENCOUNTER — Telehealth: Payer: Self-pay | Admitting: Internal Medicine

## 2014-03-01 NOTE — Telephone Encounter (Signed)
Patient Information:  Caller Name: Susan Weeks  Phone: [phone]  Patient: Susan Weeks  Gender: Female  DOB: 18-Aug-1950  Age: 63 Years  PCP: [name]  Office Follow Up:  Does the office need to follow up with this patient?: No  Instructions For The Office: N/A   Symptoms  Reason For Call & Symptoms: Pt reports she has nausea and diarrhea.  Reviewed Health History In EMR: Yes  Reviewed Medications In EMR: Yes  Reviewed Allergies In EMR: Yes  Reviewed Surgeries / Procedures: Yes  Date of Onset of Symptoms: 03/01/2014  Guideline(s) Used:  Diarrhea  Disposition Per Guideline:   Home Care  Reason For Disposition Reached:   Mild diarrhea  Advice Given:  Reassurance:  In healthy adults, new-onset diarrhea is usually caused by a viral infection of the intestines, which you can treat at home. Diarrhea is the body's way of getting rid of the infection. Here are some tips on how to keep ahead of the fluid losses.  Here is some care advice that should help.  Fluids:  Drink more fluids, at least 8-10 glasses (8 oz or 240 ml) daily.  For example: sports drinks, diluted fruit juices, soft drinks.  Supplement this with saltine crackers or soups to make certain that you are getting sufficient fluid and salt to meet your body's needs.  Avoid caffeinated beverages (Reason: caffeine is mildly dehydrating).  Nutrition:  Maintaining some food intake during episodes of diarrhea is important.  Other acceptable foods include: bananas, yogurt, crackers, soup.  As your stools return to normal consistency, resume a normal diet.  Expected Course:  Viral diarrhea lasts 4-7 days. Always worse on days 1 and 2.  Call Back If:  Signs of dehydration occur (e.g., no urine for more than 12 hours, very dry mouth, lightheaded, etc.)  Diarrhea lasts over 7 days  You become worse.  Patient Will Follow Care Advice:  YES

## 2014-03-02 ENCOUNTER — Encounter: Payer: Self-pay | Admitting: Internal Medicine

## 2014-03-02 ENCOUNTER — Telehealth: Payer: Self-pay | Admitting: Internal Medicine

## 2014-03-02 NOTE — Telephone Encounter (Signed)
Pt states it's ok to reveal to her employer that she called yesterday in regards to her symptoms and was told she has a viral infection. Pt stopped by the office.

## 2014-03-13 ENCOUNTER — Other Ambulatory Visit (INDEPENDENT_AMBULATORY_CARE_PROVIDER_SITE_OTHER): Payer: BC Managed Care – PPO

## 2014-03-13 ENCOUNTER — Other Ambulatory Visit: Payer: BC Managed Care – PPO

## 2014-03-13 DIAGNOSIS — I10 Essential (primary) hypertension: Secondary | ICD-10-CM

## 2014-03-13 NOTE — Addendum Note (Signed)
Addended by: Johnsie Cancel on: 03/13/2014 03:17 PM   Modules accepted: Orders

## 2014-03-14 LAB — BASIC METABOLIC PANEL
BUN: 20 mg/dL (ref 6–23)
CO2: 27 mEq/L (ref 19–32)
Calcium: 9.6 mg/dL (ref 8.4–10.5)
Chloride: 107 mEq/L (ref 96–112)
Creatinine, Ser: 1 mg/dL (ref 0.4–1.2)
GFR: 74.28 mL/min (ref >60.00)
Glucose, Bld: 119 mg/dL — ABNORMAL HIGH (ref 70–99)
Potassium: 4.3 mEq/L (ref 3.5–5.1)
Sodium: 144 mEq/L (ref 135–145)

## 2014-03-16 ENCOUNTER — Encounter: Payer: Self-pay | Admitting: *Deleted

## 2014-04-12 ENCOUNTER — Other Ambulatory Visit: Payer: Self-pay | Admitting: Internal Medicine

## 2014-04-26 ENCOUNTER — Other Ambulatory Visit: Payer: Self-pay | Admitting: Internal Medicine

## 2014-04-27 ENCOUNTER — Other Ambulatory Visit: Payer: Self-pay | Admitting: Internal Medicine

## 2014-05-02 ENCOUNTER — Ambulatory Visit: Payer: BC Managed Care – PPO | Admitting: Internal Medicine

## 2014-06-12 ENCOUNTER — Ambulatory Visit (INDEPENDENT_AMBULATORY_CARE_PROVIDER_SITE_OTHER): Payer: BLUE CROSS/BLUE SHIELD | Admitting: Nurse Practitioner

## 2014-06-12 ENCOUNTER — Encounter: Payer: Self-pay | Admitting: Nurse Practitioner

## 2014-06-12 VITALS — BP 110/72 | HR 87 | Temp 97.6°F | Resp 14 | Ht 63.0 in | Wt 173.8 lb

## 2014-06-12 DIAGNOSIS — R21 Rash and other nonspecific skin eruption: Secondary | ICD-10-CM

## 2014-06-12 MED ORDER — TRIAMCINOLONE ACETONIDE 0.025 % EX OINT: 1.0000 "application " | TOPICAL_OINTMENT | Freq: Two times a day (BID) | CUTANEOUS | Status: DC

## 2014-06-12 NOTE — Progress Notes (Signed)
Pre visit review using our clinic review tool, if applicable. No additional management support is needed unless otherwise documented below in the visit note. 

## 2014-06-12 NOTE — Assessment & Plan Note (Signed)
Worsening. Rx for kenalog 0.025% ointment she can apply up to twice daily. FU failure to improve/worsening.

## 2014-06-12 NOTE — Patient Instructions (Signed)
Try the ointment- a thin layer up to twice a day on the spots that are most bothersome.   Call us if failure to improve or worsening.

## 2014-06-12 NOTE — Progress Notes (Signed)
Subjective:    [name]: Susan Weeks, female    DOB: 1951-03-26, 64 y.o.   MRN: [id number]  HPI  Susan Weeks is a 64 yo female with a CC of rash on arms and thighs.   1) Began in Dec. With Left arm itching, spreading from elbow down, right arm came later and was less rashy, bumpy, and itchy.  Treatment to date:  Changed lotions- trying to find one that was helpful Alcohol on spots- burns with alcohol  Vasaline- helpful   No rashes on anyone else in the family No detergent changes Washed new microfiber sheets that recently bought No change of medications  No changes to anything at work  No new supplements or dietary changes  not affected by hot shower   Review of Systems  Constitutional: Negative for fever, chills, diaphoresis and fatigue.  Respiratory: Negative for chest tightness, shortness of breath and wheezing.   Cardiovascular: Negative for chest pain, palpitations and leg swelling.  Gastrointestinal: Negative for nausea, vomiting, diarrhea and rectal pain.  Skin: Positive for rash.  Neurological: Negative for dizziness, weakness, numbness and headaches.  Psychiatric/Behavioral: The patient is not nervous/anxious.    Past Medical History  Diagnosis Date  . Hypertension   . Hyperlipidemia   . Colon cancer     adenocarcinoma of the cecum s/p partial colon resection  . Abnormal liver function     History   Social History  . Marital Status: Married    Spouse Name: N/A    Number of Children: 1  . Years of Education: N/A   Occupational History  . Not on file.   Social History Main Topics  . Smoking status: Never Smoker   . Smokeless tobacco: Never Used  . Alcohol Use: No  . Drug Use: No  . Sexual Activity: Not on file   Other Topics Concern  . Not on file   Social History Narrative    Past Surgical History  Procedure Laterality Date  . Appendectomy    . Abdominal hysterectomy  1984    secondary to bleeding and fibroid tumors    Family  History  Problem Relation Age of Onset  . Stroke Mother   . Hypertension Mother   . Cancer Father     Prostate   . Heart disease Brother     myocardial infarction  . Hypertension Brother   . Hypertension Sister     x4    Allergies  Allergen Reactions  . Z-Pak [Azithromycin] Other (See Comments)    Caused Emesis    Current Outpatient Prescriptions on File Prior to Visit  Medication Sig Dispense Refill  . amLODipine (NORVASC) 5 MG tablet TAKE ONE TABLET BY MOUTH ONCE DAILY 30 tablet 0  . amLODipine (NORVASC) 5 MG tablet TAKE ONE TABLET BY MOUTH ONCE DAILY 30 tablet 5  . calcium carbonate (OS-CAL) 600 MG TABS Take 600 mg by mouth 2 (two) times daily with a meal.    . lisinopril (PRINIVIL,ZESTRIL) 30 MG tablet TAKE ONE TABLET BY MOUTH ONCE DAILY 30 tablet 0  . lisinopril (PRINIVIL,ZESTRIL) 30 MG tablet TAKE ONE TABLET BY MOUTH ONCE DAILY 30 tablet 5  . vitamin E 400 UNIT capsule Take 400 Units by mouth daily.      No current facility-administered medications on file prior to visit.      Objective:   Physical Exam  Constitutional: She is oriented to person, place, and time. She appears well-developed and well-nourished. No distress.  BP 110/72  mmHg  Pulse 87  Temp(Src) 97.6 F (36.4 C) (Oral)  Resp 14  Ht 5\' 3"  (1.6 m)  Wt 173 lb 12.8 oz (78.835 kg)  BMI 30.79 kg/m2  SpO2 95%  LMP 06/28/1982   HENT:  Head: Normocephalic and atraumatic.  Right Ear: External ear normal.  Left Ear: External ear normal.  Mouth/Throat: No oropharyngeal exudate.  Eyes: Conjunctivae and EOM are normal. Pupils are equal, round, and reactive to light. Right eye exhibits no discharge. Left eye exhibits no discharge. No scleral icterus.  Neck: Normal range of motion. Neck supple. No thyromegaly present.  Cardiovascular: Normal rate, regular rhythm, normal heart sounds and intact distal pulses.  Exam reveals no gallop and no friction rub.   No murmur heard. Pulmonary/Chest: Effort normal and  breath sounds normal. No respiratory distress. She has no wheezes. She has no rales. She exhibits no tenderness.  Lymphadenopathy:    She has no cervical adenopathy.  Neurological: She is alert and oriented to person, place, and time. No cranial nerve deficit. She exhibits normal muscle tone. Coordination normal.  Skin: Skin is warm and dry. Rash noted. She is not diaphoretic.     Psychiatric: She has a normal mood and affect. Her behavior is normal. Judgment and thought content normal.      Assessment & Plan:

## 2014-07-05 ENCOUNTER — Telehealth: Payer: Self-pay

## 2014-07-05 NOTE — Telephone Encounter (Signed)
The patient called and is hoping to get a refill of triamcinolone cream.  She states the cream is helping your rash, but she is out of the medicine.   [facility] [address]  Pt callback [phone]

## 2014-07-06 ENCOUNTER — Other Ambulatory Visit: Payer: Self-pay | Admitting: *Deleted

## 2014-07-06 MED ORDER — TRIAMCINOLONE ACETONIDE 0.025 % EX OINT: 1.0000 "application " | TOPICAL_OINTMENT | Freq: Two times a day (BID) | CUTANEOUS | Status: DC

## 2014-07-11 NOTE — Telephone Encounter (Signed)
Late entry-Completed on 07/06/14

## 2014-07-27 ENCOUNTER — Telehealth: Payer: Self-pay | Admitting: Internal Medicine

## 2014-07-27 NOTE — Telephone Encounter (Signed)
Form placed in red folder  

## 2014-07-27 NOTE — Telephone Encounter (Signed)
Forms needing to be filled out for [employer] benefits. Forms in Dr. Bary Leriche box.

## 2014-07-30 NOTE — Telephone Encounter (Signed)
Form in your box.  I marked the procedures and tests that have been performed, but there is nothing for me to sign.  She completes the forms.

## 2014-07-31 NOTE — Telephone Encounter (Signed)
Forms place up front for pick up & pt notified

## 2014-08-18 ENCOUNTER — Other Ambulatory Visit: Payer: Self-pay | Admitting: Internal Medicine

## 2014-08-18 DIAGNOSIS — Z1231 Encounter for screening mammogram for malignant neoplasm of breast: Secondary | ICD-10-CM

## 2014-08-21 ENCOUNTER — Encounter: Payer: Self-pay | Admitting: Internal Medicine

## 2014-08-21 ENCOUNTER — Telehealth: Payer: Self-pay | Admitting: *Deleted

## 2014-08-21 ENCOUNTER — Other Ambulatory Visit (INDEPENDENT_AMBULATORY_CARE_PROVIDER_SITE_OTHER): Payer: BLUE CROSS/BLUE SHIELD

## 2014-08-21 DIAGNOSIS — D72819 Decreased white blood cell count, unspecified: Secondary | ICD-10-CM | POA: Diagnosis not present

## 2014-08-21 DIAGNOSIS — E7800 Pure hypercholesterolemia, unspecified: Secondary | ICD-10-CM

## 2014-08-21 DIAGNOSIS — I10 Essential (primary) hypertension: Secondary | ICD-10-CM | POA: Diagnosis not present

## 2014-08-21 DIAGNOSIS — K7689 Other specified diseases of liver: Secondary | ICD-10-CM | POA: Diagnosis not present

## 2014-08-21 DIAGNOSIS — R945 Abnormal results of liver function studies: Secondary | ICD-10-CM

## 2014-08-21 LAB — HEPATIC FUNCTION PANEL
ALT: 34 U/L (ref 0–35)
AST: 21 U/L (ref 0–37)
Albumin: 4.3 g/dL (ref 3.5–5.2)
Alkaline Phosphatase: 62 U/L (ref 39–117)
Bilirubin, Direct: 0.1 mg/dL (ref 0.0–0.3)
Total Bilirubin: 0.7 mg/dL (ref 0.2–1.2)
Total Protein: 6.9 g/dL (ref 6.0–8.3)

## 2014-08-21 LAB — CBC WITH DIFFERENTIAL/PLATELET
Basophils Absolute: 0 10*3/uL (ref 0.0–0.1)
Basophils Relative: 0.4 % (ref 0.0–3.0)
Eosinophils Absolute: 0 10*3/uL (ref 0.0–0.7)
Eosinophils Relative: 1.4 % (ref 0.0–5.0)
HCT: 45.2 % (ref 36.0–46.0)
Hemoglobin: 15.5 g/dL — ABNORMAL HIGH (ref 12.0–15.0)
Lymphocytes Relative: 40.1 % (ref 12.0–46.0)
Lymphs Abs: 1.4 10*3/uL (ref 0.7–4.0)
MCHC: 34.3 g/dL (ref 30.0–36.0)
MCV: 85.1 fl (ref 78.0–100.0)
Monocytes Absolute: 0.2 10*3/uL (ref 0.1–1.0)
Monocytes Relative: 5.6 % (ref 3.0–12.0)
Neutro Abs: 1.8 10*3/uL (ref 1.4–7.7)
Neutrophils Relative %: 52.5 % (ref 43.0–77.0)
Platelets: 230 10*3/uL (ref 150.0–400.0)
RBC: 5.3 Mil/uL — ABNORMAL HIGH (ref 3.87–5.11)
RDW: 13.1 % (ref 11.5–15.5)
WBC: 3.4 10*3/uL — ABNORMAL LOW (ref 4.0–10.5)

## 2014-08-21 LAB — BASIC METABOLIC PANEL
BUN: 19 mg/dL (ref 6–23)
CO2: 29 mEq/L (ref 19–32)
Calcium: 9.3 mg/dL (ref 8.4–10.5)
Chloride: 104 mEq/L (ref 96–112)
Creatinine, Ser: 0.86 mg/dL (ref 0.40–1.20)
GFR: 85.23 mL/min (ref >60.00)
Glucose, Bld: 104 mg/dL — ABNORMAL HIGH (ref 70–99)
Potassium: 4.4 mEq/L (ref 3.5–5.1)
Sodium: 137 mEq/L (ref 135–145)

## 2014-08-21 LAB — LIPID PANEL
Cholesterol: 223 mg/dL — ABNORMAL HIGH (ref 0–200)
HDL: 53.4 mg/dL (ref >39.00)
LDL Cholesterol: 148 mg/dL — ABNORMAL HIGH (ref 0–99)
NonHDL: 169.6
Total CHOL/HDL Ratio: 4
Triglycerides: 108 mg/dL (ref 0.0–149.0)
VLDL: 21.6 mg/dL (ref 0.0–40.0)

## 2014-08-21 LAB — TSH: TSH: 1.49 u[IU]/mL (ref 0.35–4.50)

## 2014-08-21 NOTE — Telephone Encounter (Signed)
Orders placed for labs

## 2014-08-21 NOTE — Telephone Encounter (Signed)
Labs and dx?  

## 2014-08-23 ENCOUNTER — Encounter: Payer: Self-pay | Admitting: Internal Medicine

## 2014-08-23 ENCOUNTER — Ambulatory Visit (INDEPENDENT_AMBULATORY_CARE_PROVIDER_SITE_OTHER): Payer: BLUE CROSS/BLUE SHIELD | Admitting: Internal Medicine

## 2014-08-23 VITALS — BP 120/80 | HR 76 | Temp 98.4°F | Ht 63.0 in | Wt 172.1 lb

## 2014-08-23 DIAGNOSIS — C189 Malignant neoplasm of colon, unspecified: Secondary | ICD-10-CM | POA: Diagnosis not present

## 2014-08-23 DIAGNOSIS — I10 Essential (primary) hypertension: Secondary | ICD-10-CM | POA: Diagnosis not present

## 2014-08-23 DIAGNOSIS — R21 Rash and other nonspecific skin eruption: Secondary | ICD-10-CM | POA: Diagnosis not present

## 2014-08-23 DIAGNOSIS — K7689 Other specified diseases of liver: Secondary | ICD-10-CM | POA: Diagnosis not present

## 2014-08-23 DIAGNOSIS — D72819 Decreased white blood cell count, unspecified: Secondary | ICD-10-CM

## 2014-08-23 DIAGNOSIS — R945 Abnormal results of liver function studies: Secondary | ICD-10-CM

## 2014-08-23 DIAGNOSIS — Z0000 Encounter for general adult medical examination without abnormal findings: Secondary | ICD-10-CM

## 2014-08-23 DIAGNOSIS — E7800 Pure hypercholesterolemia, unspecified: Secondary | ICD-10-CM

## 2014-08-23 MED ORDER — LISINOPRIL 30 MG PO TABS: 30.0000 mg | ORAL_TABLET | Freq: Every day | ORAL | Status: DC

## 2014-08-23 MED ORDER — AMLODIPINE BESYLATE 5 MG PO TABS: 5.0000 mg | ORAL_TABLET | Freq: Every day | ORAL | Status: DC

## 2014-08-23 NOTE — Progress Notes (Signed)
[name]: Susan Weeks, female   DOB: Jun 13, 1950, 64 y.o.   MRN: [id number]   Subjective:    [name]: Susan Weeks, female    DOB: 02/15/1951, 64 y.o.   MRN: [id number]  HPI  Patient here for her physical exam.  Has had a rash on her arms and legs.  Was seen here on 06/12/14 and given triamcinolone.  Rash persistent despite triamcinolone.  Some itching, but no significant itching.  No fever.  No facial or trunk involvement.  Stays active.  No cardiac symptoms with increased activity or exertion.  Breathing stable.  Bowels stable.     Past Medical History  Diagnosis Date  . Hypertension   . Hyperlipidemia   . Colon cancer     adenocarcinoma of the cecum s/p partial colon resection  . Abnormal liver function     Outpatient Encounter Prescriptions as of 08/23/2014  Medication Sig  . amLODipine (NORVASC) 5 MG tablet Take 1 tablet (5 mg total) by mouth daily.  . calcium carbonate (OS-CAL) 600 MG TABS Take 600 mg by mouth 2 (two) times daily with a meal.  . lisinopril (PRINIVIL,ZESTRIL) 30 MG tablet Take 1 tablet (30 mg total) by mouth daily.  [name] triamcinolone (KENALOG) 0.025 % ointment Apply 1 application topically 2 (two) times daily.  . vitamin E 400 UNIT capsule Take 400 Units by mouth daily.   . [DISCONTINUED] amLODipine (NORVASC) 5 MG tablet TAKE ONE TABLET BY MOUTH ONCE DAILY  . [DISCONTINUED] amLODipine (NORVASC) 5 MG tablet TAKE ONE TABLET BY MOUTH ONCE DAILY  . [DISCONTINUED] lisinopril (PRINIVIL,ZESTRIL) 30 MG tablet TAKE ONE TABLET BY MOUTH ONCE DAILY  . [DISCONTINUED] lisinopril (PRINIVIL,ZESTRIL) 30 MG tablet TAKE ONE TABLET BY MOUTH ONCE DAILY    Review of Systems  Constitutional: Negative for appetite change and unexpected weight change.  HENT: Negative for congestion, sinus pressure and sore throat.   Eyes: Negative for pain and visual disturbance.  Respiratory: Negative for cough, chest tightness and shortness of breath.   Cardiovascular: Negative for chest pain,  palpitations and leg swelling.  Gastrointestinal: Negative for nausea, vomiting, abdominal pain and diarrhea.  Genitourinary: Negative for frequency and difficulty urinating.  Musculoskeletal: Negative for back pain and joint swelling.  Skin: Positive for rash (persistent rash arms and legs.  some mild intermittent itching. ). Negative for color change.  Neurological: Negative for dizziness, light-headedness and headaches.  Hematological: Negative for adenopathy. Does not bruise/bleed easily.  Psychiatric/Behavioral: Negative for dysphoric mood and agitation.       Objective:    Physical Exam  Constitutional: She is oriented to person, place, and time. She appears well-developed and well-nourished.  HENT:  Nose: Nose normal.  Mouth/Throat: Oropharynx is clear and moist.  Eyes: Right eye exhibits no discharge. Left eye exhibits no discharge. No scleral icterus.  Neck: Neck supple. No thyromegaly present.  Cardiovascular: Normal rate and regular rhythm.   Pulmonary/Chest: Breath sounds normal. No accessory muscle usage. No tachypnea. No respiratory distress. She has no decreased breath sounds. She has no wheezes. She has no rhonchi. Right breast exhibits no inverted nipple, no mass, no nipple discharge and no tenderness (no axillary adenopathy). Left breast exhibits no inverted nipple, no mass, no nipple discharge and no tenderness (no axilarry adenopathy).  Abdominal: Soft. Bowel sounds are normal. There is no tenderness.  Musculoskeletal: She exhibits no edema or tenderness.  Lymphadenopathy:    She has no cervical adenopathy.  Neurological: She is alert and oriented to person, place,  and time.  Skin: Skin is warm. No rash noted.  Psychiatric: She has a normal mood and affect. Her behavior is normal.    BP 120/80 mmHg  Pulse 76  Temp(Src) 98.4 F (36.9 C) (Oral)  Ht 5\' 3"  (1.6 m)  Wt 172 lb 2 oz (78.075 kg)  BMI 30.50 kg/m2  SpO2 98%  LMP 06/28/1982 Wt Readings from Last 3  Encounters:  08/23/14 172 lb 2 oz (78.075 kg)  06/12/14 173 lb 12.8 oz (78.835 kg)  02/07/14 172 lb 8 oz (78.245 kg)     Lab Results  Component Value Date   WBC 3.4* 08/21/2014   HGB 15.5* 08/21/2014   HCT 45.2 08/21/2014   PLT 230.0 08/21/2014   GLUCOSE 104* 08/21/2014   CHOL 223* 08/21/2014   TRIG 108.0 08/21/2014   HDL 53.40 08/21/2014   LDLDIRECT 150.8 03/02/2013   LDLCALC 148* 08/21/2014   ALT 34 08/21/2014   AST 21 08/21/2014   NA 137 08/21/2014   K 4.4 08/21/2014   CL 104 08/21/2014   CREATININE 0.86 08/21/2014   BUN 19 08/21/2014   CO2 29 08/21/2014   TSH 1.49 08/21/2014       Assessment & Plan:   Problem List Items Addressed This Visit    Abnormal liver function    Abdominal ultrasound 08/05/13 - negative.  Recent liver panel wnl.       Relevant Orders   Hepatic function panel   Colon cancer    Colonoscopy 02/04/13 - diverticulosis.  Recommended f/u colonoscopy in 3 years.        Essential hypertension, benign    Blood pressure doing well.  Same medication regimen.  Follow pressures.  Follow metabolic panel.       Relevant Medications   amLODipine (NORVASC) 5 MG tablet   lisinopril (PRINIVIL,ZESTRIL) 30 MG tablet   Other Relevant Orders   Basic metabolic panel   Health care maintenance    Physical today.  Mammogram 11/28/13 - birads I.  Colonoscopy 02/08/13.  Recommended f/u colonoscopy in 3 years.        Leukopenia    Cbc followed by Dr Oliva Bustard.       Pure hypercholesterolemia    Low cholesterol diet and exercise.  Follow lipid panel.  LDL recently improved some.        Relevant Medications   amLODipine (NORVASC) 5 MG tablet   lisinopril (PRINIVIL,ZESTRIL) 30 MG tablet   Other Relevant Orders   Lipid panel   Rash and nonspecific skin eruption    Unclear etiology.  Some mild itching intermittently.  Did not resolve with triamcinolone cream.  Refer to dermatology.         Other Visit Diagnoses    Rash    -  Primary    Relevant Orders     Ambulatory referral to Dermatology      I spent 25 minutes with the patient and more than 50% of the time was spent in consultation regarding the above.     [name], MD

## 2014-08-23 NOTE — Progress Notes (Signed)
Pre visit review using our clinic review tool, if applicable. No additional management support is needed unless otherwise documented below in the visit note. 

## 2014-08-23 NOTE — Assessment & Plan Note (Signed)
Blood pressure doing well.  Same medication regimen.  Follow pressures.  Follow metabolic panel.   

## 2014-08-23 NOTE — Assessment & Plan Note (Signed)
Physical today.  Mammogram 11/28/13 - birads I.  Colonoscopy 02/08/13.  Recommended f/u colonoscopy in 3 years.

## 2014-08-23 NOTE — Assessment & Plan Note (Signed)
Low cholesterol diet and exercise.  Follow lipid panel.  LDL recently improved some.

## 2014-08-23 NOTE — Assessment & Plan Note (Signed)
Colonoscopy 02/04/13 - diverticulosis.  Recommended f/u colonoscopy in 3 years.

## 2014-08-23 NOTE — Assessment & Plan Note (Signed)
Unclear etiology.  Some mild itching intermittently.  Did not resolve with triamcinolone cream.  Refer to dermatology.

## 2014-08-23 NOTE — Assessment & Plan Note (Signed)
Abdominal ultrasound 08/05/13 - negative.  Recent liver panel wnl.

## 2014-08-23 NOTE — Assessment & Plan Note (Signed)
Cbc followed by Dr Oliva Bustard.

## 2014-10-27 ENCOUNTER — Other Ambulatory Visit: Payer: Self-pay | Admitting: *Deleted

## 2014-10-27 DIAGNOSIS — C189 Malignant neoplasm of colon, unspecified: Secondary | ICD-10-CM

## 2014-10-30 ENCOUNTER — Inpatient Hospital Stay: Payer: BLUE CROSS/BLUE SHIELD | Attending: Oncology

## 2014-10-30 ENCOUNTER — Inpatient Hospital Stay (HOSPITAL_BASED_OUTPATIENT_CLINIC_OR_DEPARTMENT_OTHER): Payer: BLUE CROSS/BLUE SHIELD | Admitting: Oncology

## 2014-10-30 VITALS — BP 138/82 | HR 89 | Temp 97.3°F | Wt 174.2 lb

## 2014-10-30 DIAGNOSIS — Z79899 Other long term (current) drug therapy: Secondary | ICD-10-CM

## 2014-10-30 DIAGNOSIS — R945 Abnormal results of liver function studies: Secondary | ICD-10-CM | POA: Insufficient documentation

## 2014-10-30 DIAGNOSIS — Z85038 Personal history of other malignant neoplasm of large intestine: Secondary | ICD-10-CM

## 2014-10-30 DIAGNOSIS — I10 Essential (primary) hypertension: Secondary | ICD-10-CM

## 2014-10-30 DIAGNOSIS — E785 Hyperlipidemia, unspecified: Secondary | ICD-10-CM | POA: Diagnosis not present

## 2014-10-30 DIAGNOSIS — C189 Malignant neoplasm of colon, unspecified: Secondary | ICD-10-CM

## 2014-10-30 LAB — CBC WITH DIFFERENTIAL/PLATELET
Basophils Absolute: 0.1 10*3/uL (ref 0–0.1)
Basophils Relative: 1 %
Eosinophils Absolute: 0.1 10*3/uL (ref 0–0.7)
Eosinophils Relative: 1 %
HCT: 46.3 % (ref 35.0–47.0)
Hemoglobin: 15.1 g/dL (ref 12.0–16.0)
Lymphocytes Relative: 35 %
Lymphs Abs: 1.7 10*3/uL (ref 1.0–3.6)
MCH: 28.4 pg (ref 26.0–34.0)
MCHC: 32.7 g/dL (ref 32.0–36.0)
MCV: 86.9 fL (ref 80.0–100.0)
Monocytes Absolute: 0.4 10*3/uL (ref 0.2–0.9)
Monocytes Relative: 8 %
Neutro Abs: 2.6 10*3/uL (ref 1.4–6.5)
Neutrophils Relative %: 55 %
Platelets: 237 10*3/uL (ref 150–440)
RBC: 5.32 MIL/uL — ABNORMAL HIGH (ref 3.80–5.20)
RDW: 13.4 % (ref 11.5–14.5)
WBC: 4.8 10*3/uL (ref 3.6–11.0)

## 2014-10-30 LAB — COMPREHENSIVE METABOLIC PANEL
ALT: 42 U/L (ref 14–54)
AST: 26 U/L (ref 15–41)
Albumin: 4.3 g/dL (ref 3.5–5.0)
Alkaline Phosphatase: 73 U/L (ref 38–126)
Anion gap: 4 — ABNORMAL LOW (ref 5–15)
BUN: 19 mg/dL (ref 6–20)
CO2: 32 mmol/L (ref 22–32)
Calcium: 9.3 mg/dL (ref 8.9–10.3)
Chloride: 103 mmol/L (ref 101–111)
Creatinine, Ser: 0.91 mg/dL (ref 0.44–1.00)
GFR calc Af Amer: >60 mL/min (ref >60)
GFR calc non Af Amer: >60 mL/min (ref >60)
Glucose, Bld: 105 mg/dL — ABNORMAL HIGH (ref 65–99)
Potassium: 4.3 mmol/L (ref 3.5–5.1)
Sodium: 139 mmol/L (ref 135–145)
Total Bilirubin: 0.5 mg/dL (ref 0.3–1.2)
Total Protein: 7.6 g/dL (ref 6.5–8.1)

## 2014-10-30 NOTE — Progress Notes (Signed)
Patient does not have living will.  Former smoker. 

## 2014-10-31 ENCOUNTER — Telehealth: Payer: Self-pay | Admitting: *Deleted

## 2014-10-31 LAB — CEA: CEA: 5.1 ng/mL — ABNORMAL HIGH (ref 0.0–4.7)

## 2014-10-31 NOTE — Telephone Encounter (Signed)
CEA WNL, okay to discharge pt from Dr. Oliva Bustard care. Pt needs to continue routine followup with PCP.

## 2014-10-31 NOTE — Telephone Encounter (Signed)
Called patient and left message that CEA was within normal limits.  Dr. Oliva Bustard is discharging patient and is advising her to follow up with her PCP.

## 2014-11-03 ENCOUNTER — Encounter: Payer: Self-pay | Admitting: Oncology

## 2014-11-03 NOTE — Progress Notes (Signed)
Susan Weeks @ [facility] [phone]  Fax:(336) [place]: 1950/09/20  MR#: [id number]  IHK#:[id number]  Patient Care Team: [name], MD as PCP - General (Internal Medicine)  CHIEF COMPLAINT:  Chief Complaint  Patient presents with  . Follow-up    Oncology History   Previous history of carcinoma of colon status post resection, has an intermittent rising CEA.  Status post resection, no evidence of malignancy. Patient had exploratory laparotomy because of abnormal PET scan following rising CEA and was negative for any malignancy. CEA  remains stable in the range of 5.0        Colon cancer   02/15/2013 Initial Diagnosis Colon cancer    No flowsheet data found.  INTERVAL HISTORY:  64 year old lady came today further follow-up regarding carcinoma of colon.  Is rising CEA.  Appetite has been stable.  No rectal bleeding.  Patient is getting regular colonoscopy. Patient CEA remains stable in the range of 5.0 for several years now.  Previously patient had a PET scan done as well as exploratory laparotomy and there was no evidence of recurrent or progressive disease.  REVIEW OF SYSTEMS:   GENERAL:  Feels good.  Active.  No fevers, sweats or weight loss. PERFORMANCE STATUS (ECOG): 0 HEENT:  No visual changes, runny nose, sore throat, mouth sores or tenderness. Lungs: No shortness of breath or cough.  No hemoptysis. Cardiac:  No chest pain, palpitations, orthopnea, or PND. GI:  No nausea, vomiting, diarrhea, constipation, melena or hematochezia. GU:  No urgency, frequency, dysuria, or hematuria. Musculoskeletal:  No back pain.  No joint pain.  No muscle tenderness. Extremities:  No pain or swelling. Skin:  No rashes or skin changes. Neuro:  No headache, numbness or weakness, balance or coordination issues. Endocrine:  No diabetes, thyroid issues, hot flashes or night sweats. Psych:  No mood changes, depression or anxiety. Pain:  No focal  pain. Review of systems:  All other systems reviewed and found to be negative. As per HPI. Otherwise, a complete review of systems is negatve.  PAST MEDICAL HISTORY: Past Medical History  Diagnosis Date  . Hypertension   . Hyperlipidemia   . Colon cancer     adenocarcinoma of the cecum s/p partial colon resection  . Abnormal liver function     PAST SURGICAL HISTORY: Past Surgical History  Procedure Laterality Date  . Appendectomy    . Abdominal hysterectomy  1984    secondary to bleeding and fibroid tumors    FAMILY HISTORY Family History  Problem Relation Age of Onset  . Stroke Mother   . Hypertension Mother   . Cancer Father     Prostate   . Heart disease Brother     myocardial infarction  . Hypertension Brother   . Hypertension Sister     x4    ADVANCED DIRECTIVES:   patient does have   LIVING WILL  HEALTH MAINTENANCE: History  Substance Use Topics  . Smoking status: Never Smoker   . Smokeless tobacco: Never Used  . Alcohol Use: No      Allergies  Allergen Reactions  . Z-Pak [Azithromycin] Other (See Comments)    Caused Emesis    Current Outpatient Prescriptions  Medication Sig Dispense Refill  . amLODipine (NORVASC) 5 MG tablet Take 1 tablet (5 mg total) by mouth daily. 90 tablet 1  . calcium carbonate (OS-CAL) 600 MG TABS Take 600 mg by mouth 2 (two) times daily with a meal.    .  fexofenadine (ALLEGRA) 30 MG tablet Take 30 mg by mouth 2 (two) times daily.    [name] lisinopril (PRINIVIL,ZESTRIL) 30 MG tablet Take 1 tablet (30 mg total) by mouth daily. 90 tablet 1  . vitamin E 400 UNIT capsule Take 400 Units by mouth daily.     [name] triamcinolone (KENALOG) 0.025 % ointment Apply 1 application topically 2 (two) times daily. (Patient not taking: Reported on 10/30/2014) 15 g 1   No current facility-administered medications for this visit.    OBJECTIVE:  Filed Vitals:   10/30/14 1548  BP: 138/82  Pulse: 89  Temp: 97.3 F (36.3 C)     Body mass index is  30.86 kg/(m^2).    ECOG FS:0 - Asymptomatic  PHYSICAL EXAM: GENERAL:  Well developed, well nourished, sitting comfortably in the exam room in no acute distress. MENTAL STATUS:  Alert and oriented to person, place and time. HEAD:    Normocephalic, atraumatic, face symmetric, no Cushingoid features. EYES:  .  Pupils equal round and reactive to light and accomodation.  No conjunctivitis or scleral icterus. ENT:  Oropharynx clear without lesion.  Tongue normal. Mucous membranes moist.  RESPIRATORY:  Clear to auscultation without rales, wheezes or rhonchi. CARDIOVASCULAR:  Regular rate and rhythm without murmur, rub or gallop.  ABDOMEN:  Soft, non-tender, with active bowel sounds, and no hepatosplenomegaly.  No masses. BACK:  No CVA tenderness.  No tenderness on percussion of the back or rib cage. SKIN:  No rashes, ulcers or lesions. EXTREMITIES: No edema, no skin discoloration or tenderness.  No palpable cords. LYMPH NODES: No palpable cervical, supraclavicular, axillary or inguinal adenopathy  NEUROLOGICAL: Unremarkable. PSYCH:  Appropriate.   LAB RESULTS:  Appointment on 10/30/2014  Component Date Value Ref Range Status  . WBC 10/30/2014 4.8  3.6 - 11.0 K/uL Final  . RBC 10/30/2014 5.32* 3.80 - 5.20 MIL/uL Final  . Hemoglobin 10/30/2014 15.1  12.0 - 16.0 g/dL Final  . HCT 10/30/2014 46.3  35.0 - 47.0 % Final  . MCV 10/30/2014 86.9  80.0 - 100.0 fL Final  . MCH 10/30/2014 28.4  26.0 - 34.0 pg Final  . MCHC 10/30/2014 32.7  32.0 - 36.0 g/dL Final  . RDW 10/30/2014 13.4  11.5 - 14.5 % Final  . Platelets 10/30/2014 237  150 - 440 K/uL Final  . Neutrophils Relative % 10/30/2014 55   Final  . Neutro Abs 10/30/2014 2.6  1.4 - 6.5 K/uL Final  . Lymphocytes Relative 10/30/2014 35   Final  . Lymphs Abs 10/30/2014 1.7  1.0 - 3.6 K/uL Final  . Monocytes Relative 10/30/2014 8   Final  . Monocytes Absolute 10/30/2014 0.4  0.2 - 0.9 K/uL Final  . Eosinophils Relative 10/30/2014 1   Final  .  Eosinophils Absolute 10/30/2014 0.1  0 - 0.7 K/uL Final  . Basophils Relative 10/30/2014 1   Final  . Basophils Absolute 10/30/2014 0.1  0 - 0.1 K/uL Final  . Sodium 10/30/2014 139  135 - 145 mmol/L Final  . Potassium 10/30/2014 4.3  3.5 - 5.1 mmol/L Final  . Chloride 10/30/2014 103  101 - 111 mmol/L Final  . CO2 10/30/2014 32  22 - 32 mmol/L Final  . Glucose, Bld 10/30/2014 105* 65 - 99 mg/dL Final  . BUN 10/30/2014 19  6 - 20 mg/dL Final  . Creatinine, Ser 10/30/2014 0.91  0.44 - 1.00 mg/dL Final  . Calcium 10/30/2014 9.3  8.9 - 10.3 mg/dL Final  . Total Protein 10/30/2014 7.6  6.5 - 8.1 g/dL Final  . Albumin 10/30/2014 4.3  3.5 - 5.0 g/dL Final  . AST 10/30/2014 26  15 - 41 U/L Final  . ALT 10/30/2014 42  14 - 54 U/L Final  . Alkaline Phosphatase 10/30/2014 73  38 - 126 U/L Final  . Total Bilirubin 10/30/2014 0.5  0.3 - 1.2 mg/dL Final  . GFR calc non Af Amer 10/30/2014 >60  >60 mL/min Final  . GFR calc Af Amer 10/30/2014 >60  >60 mL/min Final   Comment: (NOTE) The eGFR has been calculated using the CKD EPI equation. This calculation has not been validated in all clinical situations. eGFR's persistently <60 mL/min signify possible Chronic Kidney Disease.   . Anion gap 10/30/2014 4* 5 - 15 Final  . CEA 10/30/2014 5.1* 0.0 - 4.7 ng/mL Final   Comment: (NOTE)       Roche ECLIA methodology       Nonsmokers  <3.9                                     Smokers     <5.6 Performed At: [facility] [place], [place] [id number] Lindon Romp MD TM:[id number]       STUDIES: No results found.  ASSESSMENT: Carcinoma: Status post resection CEA remains stable No evidence of recurrent disease  MEDICAL DECISION MAKING:  All lab data has been reviewed.  As CEA has remained stable over pleural many years patient was discharged from my care and will be followed by primary care physician Regular mammogram as well as colonoscopy would be planned by primary care  physician.  Patient expressed understanding and was in agreement with this plan. She also understands that She can call clinic at any time with any questions, concerns, or complaints.    No matching staging information was found for the patient.  Forest Gleason, MD   11/03/2014 9:33 AM

## 2014-11-30 ENCOUNTER — Ambulatory Visit
Admission: RE | Admit: 2014-11-30 | Discharge: 2014-11-30 | Disposition: A | Discharge status: Home / Self Care | Payer: BLUE CROSS/BLUE SHIELD | Source: Ambulatory Visit | Attending: Internal Medicine | Admitting: Internal Medicine

## 2014-11-30 DIAGNOSIS — Z1231 Encounter for screening mammogram for malignant neoplasm of breast: Secondary | ICD-10-CM | POA: Insufficient documentation

## 2014-11-30 DIAGNOSIS — Z1239 Encounter for other screening for malignant neoplasm of breast: Secondary | ICD-10-CM

## 2015-02-19 ENCOUNTER — Other Ambulatory Visit: Payer: BLUE CROSS/BLUE SHIELD

## 2015-02-19 ENCOUNTER — Other Ambulatory Visit (INDEPENDENT_AMBULATORY_CARE_PROVIDER_SITE_OTHER): Payer: BLUE CROSS/BLUE SHIELD

## 2015-02-19 DIAGNOSIS — E7800 Pure hypercholesterolemia, unspecified: Secondary | ICD-10-CM | POA: Diagnosis not present

## 2015-02-19 DIAGNOSIS — K7689 Other specified diseases of liver: Secondary | ICD-10-CM

## 2015-02-19 DIAGNOSIS — R945 Abnormal results of liver function studies: Secondary | ICD-10-CM

## 2015-02-19 DIAGNOSIS — I10 Essential (primary) hypertension: Secondary | ICD-10-CM | POA: Diagnosis not present

## 2015-02-19 LAB — BASIC METABOLIC PANEL
BUN: 16 mg/dL (ref 6–23)
CO2: 28 mEq/L (ref 19–32)
Calcium: 9.5 mg/dL (ref 8.4–10.5)
Chloride: 105 mEq/L (ref 96–112)
Creatinine, Ser: 0.9 mg/dL (ref 0.40–1.20)
GFR: 80.75 mL/min (ref >60.00)
Glucose, Bld: 105 mg/dL — ABNORMAL HIGH (ref 70–99)
Potassium: 4.1 mEq/L (ref 3.5–5.1)
Sodium: 142 mEq/L (ref 135–145)

## 2015-02-19 LAB — HEPATIC FUNCTION PANEL
ALT: 25 U/L (ref 0–35)
AST: 21 U/L (ref 0–37)
Albumin: 4.4 g/dL (ref 3.5–5.2)
Alkaline Phosphatase: 55 U/L (ref 39–117)
Bilirubin, Direct: 0.1 mg/dL (ref 0.0–0.3)
Total Bilirubin: 0.9 mg/dL (ref 0.2–1.2)
Total Protein: 7.4 g/dL (ref 6.0–8.3)

## 2015-02-19 LAB — LIPID PANEL
Cholesterol: 233 mg/dL — ABNORMAL HIGH (ref 0–200)
HDL: 56.9 mg/dL (ref >39.00)
LDL Cholesterol: 156 mg/dL — ABNORMAL HIGH (ref 0–99)
NonHDL: 176.18
Total CHOL/HDL Ratio: 4
Triglycerides: 100 mg/dL (ref 0.0–149.0)
VLDL: 20 mg/dL (ref 0.0–40.0)

## 2015-02-22 ENCOUNTER — Encounter: Payer: Self-pay | Admitting: Internal Medicine

## 2015-02-22 ENCOUNTER — Ambulatory Visit (INDEPENDENT_AMBULATORY_CARE_PROVIDER_SITE_OTHER): Payer: BLUE CROSS/BLUE SHIELD | Admitting: Internal Medicine

## 2015-02-22 VITALS — BP 110/70 | HR 85 | Temp 98.2°F | Resp 18 | Ht 63.0 in | Wt 174.4 lb

## 2015-02-22 DIAGNOSIS — D72819 Decreased white blood cell count, unspecified: Secondary | ICD-10-CM

## 2015-02-22 DIAGNOSIS — K7689 Other specified diseases of liver: Secondary | ICD-10-CM

## 2015-02-22 DIAGNOSIS — R739 Hyperglycemia, unspecified: Secondary | ICD-10-CM

## 2015-02-22 DIAGNOSIS — R945 Abnormal results of liver function studies: Secondary | ICD-10-CM

## 2015-02-22 DIAGNOSIS — I10 Essential (primary) hypertension: Secondary | ICD-10-CM | POA: Diagnosis not present

## 2015-02-22 DIAGNOSIS — C189 Malignant neoplasm of colon, unspecified: Secondary | ICD-10-CM | POA: Diagnosis not present

## 2015-02-22 DIAGNOSIS — E7800 Pure hypercholesterolemia, unspecified: Secondary | ICD-10-CM

## 2015-02-22 MED ORDER — LISINOPRIL 30 MG PO TABS: 30.0000 mg | ORAL_TABLET | Freq: Every day | ORAL | Status: DC

## 2015-02-22 NOTE — Progress Notes (Signed)
[name]: Susan Weeks, female   DOB: Mar 08, 1951, 64 y.o.   MRN: [id number]   Subjective:    [name]: Susan Weeks, female    DOB: May 01, 1951, 64 y.o.   MRN: [id number]  HPI  Patient with past history of hypertension, colon cancer and hypercholesterolemia who comes in today to follow up on these issues.  She saw Dr Oliva Bustard in 10/2014.  He released her.  CEA stable at 5 for years.  Last colonoscopy 2014.  No bowel issues.  Due colonoscopy next year.  Eating and drinking well.  Just recently adjusted her diet.  Also has stopped soft drinks.  Drinking water.  Started walking.   Planning to do more.  No cardiac symptoms with increased activity or exertion.  No sob.  No acid reflux.  No abdominal pain or cramping.  Had a question about skin change at her ankles.  Appears to be c/w stasis changes.  Discussed compression hose.     Past Medical History  Diagnosis Date  . Hypertension   . Hyperlipidemia   . Colon cancer ([place])     adenocarcinoma of the cecum s/p partial colon resection  . Abnormal liver function    Past Surgical History  Procedure Laterality Date  . Appendectomy    . Abdominal hysterectomy  1984    secondary to bleeding and fibroid tumors   Family History  Problem Relation Age of Onset  . Stroke Mother   . Hypertension Mother   . Cancer Father     Prostate   . Heart disease Brother     myocardial infarction  . Hypertension Brother   . Hypertension Sister     x4   Social History   Social History  . Marital Status: Married    Spouse Name: N/A  . Number of Children: 1  . Years of Education: N/A   Social History Main Topics  . Smoking status: Never Smoker   . Smokeless tobacco: Never Used  . Alcohol Use: No  . Drug Use: No  . Sexual Activity: Not Asked   Other Topics Concern  . None   Social History Narrative    Outpatient Encounter Prescriptions as of 02/22/2015  Medication Sig  . amLODipine (NORVASC) 5 MG tablet Take 1 tablet (5 mg total) by  mouth daily.  . calcium carbonate (OS-CAL) 600 MG TABS Take 600 mg by mouth 2 (two) times daily with a meal.  . fexofenadine (ALLEGRA) 30 MG tablet Take 30 mg by mouth daily.   [name] lisinopril (PRINIVIL,ZESTRIL) 30 MG tablet Take 1 tablet (30 mg total) by mouth daily.  . vitamin E 400 UNIT capsule Take 400 Units by mouth daily.   . [DISCONTINUED] lisinopril (PRINIVIL,ZESTRIL) 30 MG tablet Take 1 tablet (30 mg total) by mouth daily.  . [DISCONTINUED] triamcinolone (KENALOG) 0.025 % ointment Apply 1 application topically 2 (two) times daily. (Patient not taking: Reported on 10/30/2014)   No facility-administered encounter medications on file as of 02/22/2015.    Review of Systems  Constitutional: Negative for appetite change and unexpected weight change.       Has adjusted her diet and has started exercising.   HENT: Negative for congestion and sinus pressure.   Eyes: Negative for discharge and visual disturbance.  Respiratory: Negative for cough, chest tightness and shortness of breath.   Cardiovascular: Negative for chest pain, palpitations and leg swelling.  Gastrointestinal: Negative for nausea, vomiting, abdominal pain and diarrhea.  Genitourinary: Negative for dysuria and difficulty urinating.  Musculoskeletal: Negative for back pain and joint swelling.  Skin:       Changes at her ankles that appear to be c/w stasis changes.  No increased erythema.    Neurological: Negative for dizziness, light-headedness and headaches.  Hematological: Negative for adenopathy. Does not bruise/bleed easily.  Psychiatric/Behavioral: Negative for dysphoric mood and agitation.       Objective:     Blood pressure rechecked by me:  122/78  Physical Exam  Constitutional: She appears well-developed and well-nourished. No distress.  HENT:  Nose: Nose normal.  Mouth/Throat: Oropharynx is clear and moist.  Eyes: Conjunctivae are normal. Right eye exhibits no discharge. Left eye exhibits no discharge.    Neck: Neck supple. No thyromegaly present.  Cardiovascular: Normal rate and regular rhythm.   Pulmonary/Chest: Breath sounds normal. No respiratory distress. She has no wheezes.  Abdominal: Soft. Bowel sounds are normal. There is no tenderness.  Musculoskeletal: She exhibits no edema or tenderness.  Lymphadenopathy:    She has no cervical adenopathy.  Skin: No erythema.  Changes at her ankles that appear to be c/w stasis changes.   Psychiatric: She has a normal mood and affect. Her behavior is normal.    BP 110/70 mmHg  Pulse 85  Temp(Src) 98.2 F (36.8 C) (Oral)  Resp 18  Ht 5' 3"  (1.6 m)  Wt 174 lb 6 oz (79.096 kg)  BMI 30.90 kg/m2  SpO2 94%  LMP 06/28/1982 Wt Readings from Last 3 Encounters:  02/22/15 174 lb 6 oz (79.096 kg)  10/30/14 174 lb 2.6 oz (79 kg)  08/23/14 172 lb 2 oz (78.075 kg)     Lab Results  Component Value Date   WBC 4.8 10/30/2014   HGB 15.1 10/30/2014   HCT 46.3 10/30/2014   PLT 237 10/30/2014   GLUCOSE 105* 02/19/2015   CHOL 233* 02/19/2015   TRIG 100.0 02/19/2015   HDL 56.90 02/19/2015   LDLDIRECT 150.8 03/02/2013   LDLCALC 156* 02/19/2015   ALT 25 02/19/2015   AST 21 02/19/2015   NA 142 02/19/2015   K 4.1 02/19/2015   CL 105 02/19/2015   CREATININE 0.90 02/19/2015   BUN 16 02/19/2015   CO2 28 02/19/2015   TSH 1.49 08/21/2014    Mm Digital Screening Bilateral  11/30/2014  CLINICAL DATA:  Screening. EXAM: DIGITAL SCREENING BILATERAL MAMMOGRAM WITH CAD COMPARISON:  Previous exam(s). ACR Breast Density Category b: There are scattered areas of fibroglandular density. FINDINGS: There are no findings suspicious for malignancy. Images were processed with CAD. IMPRESSION: No mammographic evidence of malignancy. A result letter of this screening mammogram will be mailed directly to the patient. RECOMMENDATION: Screening mammogram in one year. (Code:SM-B-01Y) BI-RADS CATEGORY  1: Negative. Electronically Signed   By: Nolon Nations M.D.   On:  11/30/2014 12:10       Assessment & Plan:   Problem List Items Addressed This Visit    Abnormal liver function    Abdominal ultrasound 08/05/13 - negative.  Recent liver function tests wnl.  Previous elevated liver enzymes noted with taking statin.  Follow.  Will hold on starting a statin medication.        Relevant Orders   Hepatic function panel   Colon cancer ([place])    Colonoscopy 02/04/13 - diverticulosis.  Recommended f/u colonoscopy in three years.  Was being followed by Dr Oliva Bustard.  Has released her.  CEA level at 5 - for years.        Essential hypertension, benign - Primary  Blood pressure under good control.  Continue same medication regimen.  Follow pressures.  Follow metabolic panel.        Relevant Medications   lisinopril (PRINIVIL,ZESTRIL) 30 MG tablet   Other Relevant Orders   Basic metabolic panel   Hyperglycemia    Low carb diet and exercise.  Sugar has been elevated on last several checks.  Follow met b and a1c.        Relevant Orders   Hemoglobin A1c   Leukopenia    White count 10/30/14 - wnl - 4.8.  Follow.        Pure hypercholesterolemia    Discussed recent cholesterol results.  Discussed starting cholesterol medication.  Previously felt to have elevated liver tests related to statin medication.  Will hold on starting cholesterol medication.  She has already made some adjustments in her diet.  Has started walking.  Continue diet and exercise.  Follow.   Lab Results  Component Value Date   CHOL 233* 02/19/2015   HDL 56.90 02/19/2015   LDLCALC 156* 02/19/2015   LDLDIRECT 150.8 03/02/2013   TRIG 100.0 02/19/2015   CHOLHDL 4 02/19/2015        Relevant Medications   lisinopril (PRINIVIL,ZESTRIL) 30 MG tablet   Other Relevant Orders   Lipid panel       [name], MD

## 2015-02-22 NOTE — Progress Notes (Signed)
Pre-visit discussion using our clinic review tool. No additional management support is needed unless otherwise documented below in the visit note.  

## 2015-02-25 ENCOUNTER — Encounter: Payer: Self-pay | Admitting: Internal Medicine

## 2015-02-25 DIAGNOSIS — R739 Hyperglycemia, unspecified: Secondary | ICD-10-CM | POA: Insufficient documentation

## 2015-02-25 NOTE — Assessment & Plan Note (Signed)
Low carb diet and exercise.  Sugar has been elevated on last several checks.  Follow met b and a1c.

## 2015-02-25 NOTE — Assessment & Plan Note (Signed)
Discussed recent cholesterol results.  Discussed starting cholesterol medication.  Previously felt to have elevated liver tests related to statin medication.  Will hold on starting cholesterol medication.  She has already made some adjustments in her diet.  Has started walking.  Continue diet and exercise.  Follow.   Lab Results  Component Value Date   CHOL 233* 02/19/2015   HDL 56.90 02/19/2015   LDLCALC 156* 02/19/2015   LDLDIRECT 150.8 03/02/2013   TRIG 100.0 02/19/2015   CHOLHDL 4 02/19/2015

## 2015-02-25 NOTE — Assessment & Plan Note (Signed)
Abdominal ultrasound 08/05/13 - negative.  Recent liver function tests wnl.  Previous elevated liver enzymes noted with taking statin.  Follow.  Will hold on starting a statin medication.

## 2015-02-25 NOTE — Assessment & Plan Note (Signed)
Colonoscopy 02/04/13 - diverticulosis.  Recommended f/u colonoscopy in three years.  Was being followed by Dr Oliva Bustard.  Has released her.  CEA level at 5 - for years.

## 2015-02-25 NOTE — Assessment & Plan Note (Signed)
White count 10/30/14 - wnl - 4.8.  Follow.

## 2015-02-25 NOTE — Assessment & Plan Note (Signed)
Blood pressure under good control.  Continue same medication regimen.  Follow pressures.  Follow metabolic panel.   

## 2015-04-06 ENCOUNTER — Other Ambulatory Visit: Payer: Self-pay | Admitting: Internal Medicine

## 2015-07-10 ENCOUNTER — Other Ambulatory Visit: Payer: Self-pay | Admitting: Internal Medicine

## 2015-08-01 ENCOUNTER — Other Ambulatory Visit: Payer: Self-pay | Admitting: Internal Medicine

## 2015-08-01 DIAGNOSIS — Z1231 Encounter for screening mammogram for malignant neoplasm of breast: Secondary | ICD-10-CM

## 2015-08-27 ENCOUNTER — Other Ambulatory Visit (INDEPENDENT_AMBULATORY_CARE_PROVIDER_SITE_OTHER): Payer: BLUE CROSS/BLUE SHIELD

## 2015-08-27 DIAGNOSIS — E7800 Pure hypercholesterolemia, unspecified: Secondary | ICD-10-CM | POA: Diagnosis not present

## 2015-08-27 DIAGNOSIS — I10 Essential (primary) hypertension: Secondary | ICD-10-CM

## 2015-08-27 DIAGNOSIS — R739 Hyperglycemia, unspecified: Secondary | ICD-10-CM | POA: Diagnosis not present

## 2015-08-27 DIAGNOSIS — R945 Abnormal results of liver function studies: Secondary | ICD-10-CM

## 2015-08-27 DIAGNOSIS — K7689 Other specified diseases of liver: Secondary | ICD-10-CM | POA: Diagnosis not present

## 2015-08-27 LAB — BASIC METABOLIC PANEL
BUN: 17 mg/dL (ref 6–23)
CO2: 29 mEq/L (ref 19–32)
Calcium: 9.3 mg/dL (ref 8.4–10.5)
Chloride: 103 mEq/L (ref 96–112)
Creatinine, Ser: 0.79 mg/dL (ref 0.40–1.20)
GFR: 93.7 mL/min (ref >60.00)
Glucose, Bld: 107 mg/dL — ABNORMAL HIGH (ref 70–99)
Potassium: 4.2 mEq/L (ref 3.5–5.1)
Sodium: 138 mEq/L (ref 135–145)

## 2015-08-27 LAB — HEPATIC FUNCTION PANEL
ALT: 30 U/L (ref 0–35)
AST: 21 U/L (ref 0–37)
Albumin: 4.3 g/dL (ref 3.5–5.2)
Alkaline Phosphatase: 58 U/L (ref 39–117)
Bilirubin, Direct: 0.1 mg/dL (ref 0.0–0.3)
Total Bilirubin: 0.7 mg/dL (ref 0.2–1.2)
Total Protein: 7.3 g/dL (ref 6.0–8.3)

## 2015-08-27 LAB — LIPID PANEL
Cholesterol: 238 mg/dL — ABNORMAL HIGH (ref 0–200)
HDL: 60 mg/dL (ref >39.00)
LDL Cholesterol: 157 mg/dL — ABNORMAL HIGH (ref 0–99)
NonHDL: 178.46
Total CHOL/HDL Ratio: 4
Triglycerides: 109 mg/dL (ref 0.0–149.0)
VLDL: 21.8 mg/dL (ref 0.0–40.0)

## 2015-08-27 LAB — HEMOGLOBIN A1C: Hgb A1c MFr Bld: 5.9 % (ref 4.6–6.5)

## 2015-08-28 ENCOUNTER — Encounter: Payer: Self-pay | Admitting: Internal Medicine

## 2015-08-28 ENCOUNTER — Telehealth: Payer: Self-pay | Admitting: *Deleted

## 2015-08-28 ENCOUNTER — Ambulatory Visit (INDEPENDENT_AMBULATORY_CARE_PROVIDER_SITE_OTHER): Payer: BLUE CROSS/BLUE SHIELD | Admitting: Internal Medicine

## 2015-08-28 VITALS — BP 134/86 | HR 82 | Temp 98.3°F | Ht 63.0 in | Wt 170.2 lb

## 2015-08-28 DIAGNOSIS — R739 Hyperglycemia, unspecified: Secondary | ICD-10-CM

## 2015-08-28 DIAGNOSIS — K7689 Other specified diseases of liver: Secondary | ICD-10-CM | POA: Diagnosis not present

## 2015-08-28 DIAGNOSIS — I10 Essential (primary) hypertension: Secondary | ICD-10-CM

## 2015-08-28 DIAGNOSIS — L989 Disorder of the skin and subcutaneous tissue, unspecified: Secondary | ICD-10-CM

## 2015-08-28 DIAGNOSIS — Z0000 Encounter for general adult medical examination without abnormal findings: Secondary | ICD-10-CM

## 2015-08-28 DIAGNOSIS — R945 Abnormal results of liver function studies: Secondary | ICD-10-CM

## 2015-08-28 DIAGNOSIS — C189 Malignant neoplasm of colon, unspecified: Secondary | ICD-10-CM

## 2015-08-28 DIAGNOSIS — E7800 Pure hypercholesterolemia, unspecified: Secondary | ICD-10-CM

## 2015-08-28 NOTE — Telephone Encounter (Signed)
Patient would like to get her labs, done over at [facility] in October before her follow up, however she stated that [facility] could not see her orders. Please advise

## 2015-08-28 NOTE — Telephone Encounter (Signed)
I have  Placed the order for the labs.  Per phone message, these need to be scheduled at [facility].  See phone message.   Thanks

## 2015-08-28 NOTE — Progress Notes (Signed)
Pre visit review using our clinic review tool, if applicable. No additional management support is needed unless otherwise documented below in the visit note. 

## 2015-08-28 NOTE — Progress Notes (Signed)
[name]: LOGEN FOWLE, female   DOB: [birth year], 65 y.o.   MRN: [id number]   Subjective:    [name]: [name], female    DOB: Aug 25, 1950, 65 y.o.   MRN: [id number]  HPI  Patient here for her physical exam.  She has adjusted her diet.  We discussed her labs.  Discussed decreased carbohydrate intake.  Information given.  Discussed her cholesterol.  She is not exercising.  Plans to start.  Has tried statin medication previously and had increased LFTs with the statin.  She does try to stay active.  No cardiac symptoms with increased activity or exertion.  No sob.  No acid reflux.  No abdominal pain or cramping.  Bowels stable.  Due colonoscopy in 02/2016.  Already scheduled for her mammogram.     Past Medical History  Diagnosis Date  . Hypertension   . Hyperlipidemia   . Colon cancer ([place])     adenocarcinoma of the cecum s/p partial colon resection  . Abnormal liver function    Past Surgical History  Procedure Laterality Date  . Appendectomy    . Abdominal hysterectomy  1984    secondary to bleeding and fibroid tumors   Family History  Problem Relation Age of Onset  . Stroke Mother   . Hypertension Mother   . Cancer Father     Prostate   . Heart disease Brother     myocardial infarction  . Hypertension Brother   . Hypertension Sister     x4   Social History   Social History  . Marital Status: Married    Spouse Name: [name]  . Number of Children: 1  . Years of Education: HS   Occupational History  .  Other    [employer]   Social History Main Topics  . Smoking status: Never Smoker   . Smokeless tobacco: Never Used  . Alcohol Use: No  . Drug Use: No  . Sexual Activity: Not Asked   Other Topics Concern  . None   Social History Narrative    Outpatient Encounter Prescriptions as of 08/28/2015  Medication Sig  . amLODipine (NORVASC) 5 MG tablet TAKE ONE TABLET BY MOUTH ONCE DAILY  . calcium carbonate (OS-CAL) 600 MG TABS Take 600 mg by mouth  daily.   [name] lisinopril (PRINIVIL,ZESTRIL) 30 MG tablet Take 1 tablet (30 mg total) by mouth daily.  . vitamin E 400 UNIT capsule Take 400 Units by mouth daily.   . [DISCONTINUED] fexofenadine (ALLEGRA) 30 MG tablet Take 30 mg by mouth daily. Reported on 08/28/2015   No facility-administered encounter medications on file as of 08/28/2015.    Review of Systems  Constitutional: Negative for appetite change and unexpected weight change.       She has adjusted her diet.  Has lost some weight.   HENT: Negative for congestion, sinus pressure and sore throat.   Eyes: Negative for pain and visual disturbance.  Respiratory: Negative for cough, chest tightness and shortness of breath.   Cardiovascular: Negative for chest pain, palpitations and leg swelling.  Gastrointestinal: Negative for nausea, vomiting, abdominal pain and diarrhea.  Genitourinary: Negative for dysuria and difficulty urinating.  Musculoskeletal: Negative for back pain and joint swelling.  Skin: Negative for color change and rash.  Neurological: Negative for dizziness, light-headedness and headaches.  Hematological: Negative for adenopathy. Does not bruise/bleed easily.  Psychiatric/Behavioral: Negative for dysphoric mood and agitation.       Objective:     Blood pressure  rechecked by me:  128/82  Physical Exam  Constitutional: She is oriented to person, place, and time. She appears well-developed and well-nourished. No distress.  HENT:  Nose: Nose normal.  Mouth/Throat: Oropharynx is clear and moist.  Eyes: Right eye exhibits no discharge. Left eye exhibits no discharge. No scleral icterus.  Neck: Neck supple. No thyromegaly present.  Cardiovascular: Normal rate and regular rhythm.   Pulmonary/Chest: Breath sounds normal. No accessory muscle usage. No tachypnea. No respiratory distress. She has no decreased breath sounds. She has no wheezes. She has no rhonchi. Right breast exhibits no inverted nipple, no mass, no nipple  discharge and no tenderness (no axillary adenopathy). Left breast exhibits no inverted nipple, no mass, no nipple discharge and no tenderness (no axilarry adenopathy).  Abdominal: Soft. Bowel sounds are normal. There is no tenderness.  Musculoskeletal: She exhibits no edema or tenderness.  Lymphadenopathy:    She has no cervical adenopathy.  Neurological: She is alert and oriented to person, place, and time.  Skin: Skin is warm. No rash noted. No erythema.  Psychiatric: She has a normal mood and affect. Her behavior is normal.    BP 134/86 mmHg  Pulse 82  Temp(Src) 98.3 F (36.8 C) (Oral)  Ht 5' 3"  (1.6 m)  Wt 170 lb 3.2 oz (77.202 kg)  BMI 30.16 kg/m2  SpO2 97%  LMP 06/28/1982 Wt Readings from Last 3 Encounters:  08/28/15 170 lb 3.2 oz (77.202 kg)  02/22/15 174 lb 6 oz (79.096 kg)  10/30/14 174 lb 2.6 oz (79 kg)     Lab Results  Component Value Date   WBC 4.8 10/30/2014   HGB 15.1 10/30/2014   HCT 46.3 10/30/2014   PLT 237 10/30/2014   GLUCOSE 107* 08/27/2015   CHOL 238* 08/27/2015   TRIG 109.0 08/27/2015   HDL 60.00 08/27/2015   LDLDIRECT 150.8 03/02/2013   LDLCALC 157* 08/27/2015   ALT 30 08/27/2015   AST 21 08/27/2015   NA 138 08/27/2015   K 4.2 08/27/2015   CL 103 08/27/2015   CREATININE 0.79 08/27/2015   BUN 17 08/27/2015   CO2 29 08/27/2015   TSH 1.49 08/21/2014   HGBA1C 5.9 08/27/2015    Mm Digital Screening Bilateral  11/30/2014  CLINICAL DATA:  Screening. EXAM: DIGITAL SCREENING BILATERAL MAMMOGRAM WITH CAD COMPARISON:  Previous exam(s). ACR Breast Density Category b: There are scattered areas of fibroglandular density. FINDINGS: There are no findings suspicious for malignancy. Images were processed with CAD. IMPRESSION: No mammographic evidence of malignancy. A result letter of this screening mammogram will be mailed directly to the patient. RECOMMENDATION: Screening mammogram in one year. (Code:SM-B-01Y) BI-RADS CATEGORY  1: Negative. Electronically  Signed   By: Nolon Nations M.D.   On: 11/30/2014 12:10       Assessment & Plan:   Problem List Items Addressed This Visit    Abnormal liver function    Abdominal ultrasound 08/05/13 - negative.  Last liver panel wnl.  Previous elevated liver enzymes with statin therapy.  Recheck liver panel next labs.      Relevant Orders   Hepatic function panel   Colon cancer ([place])    Colonoscopy 02/04/13.  Recommended a f/u colonoscopy in three years.       Essential hypertension, benign    Blood pressure under good control.  Continue same medication regimen.  Follow pressures.  Follow metabolic panel.        Relevant Orders   CBC with Differential/Platelet   Basic metabolic panel  TSH   Health care maintenance    Physical today 08/28/15.  S/p hysterectomy.  Mammogram 11/30/14 - Birads I.  Already scheduled for mammogram 12/04/15.  Colonoscopy 02/2013.  Due f/u 02/2016.        Hyperglycemia    Low carb diet and exercise.  Follow met b and a1c.       Relevant Orders   Hemoglobin A1c   Pure hypercholesterolemia    Low cholesterol diet and exercise.  Follow lipid panel.  Has a history of previous increased LFTs with statin medication.        Relevant Orders   Lipid panel    Other Visit Diagnoses    Routine general medical examination at a health care facility    -  Primary    Skin lesion        Relevant Orders    Ambulatory referral to Dermatology        [name], MD

## 2015-08-28 NOTE — Telephone Encounter (Signed)
Please place future orders-then send back to front staff. I am not able to schedule labs at [facility]. They will need to schedule them. Pt wants lab appt on 02/26/16 between 7 & 7:30.

## 2015-08-28 NOTE — Assessment & Plan Note (Signed)
Physical today 08/28/15.  S/p hysterectomy.  Mammogram 11/30/14 - Birads I.  Already scheduled for mammogram 12/04/15.  Colonoscopy 02/2013.  Due f/u 02/2016.

## 2015-09-10 ENCOUNTER — Encounter: Payer: Self-pay | Admitting: Internal Medicine

## 2015-09-10 NOTE — Assessment & Plan Note (Signed)
Low cholesterol diet and exercise.  Follow lipid panel.  Has a history of previous increased LFTs with statin medication.

## 2015-09-10 NOTE — Assessment & Plan Note (Signed)
Colonoscopy 02/04/13.  Recommended a f/u colonoscopy in three years.

## 2015-09-10 NOTE — Assessment & Plan Note (Signed)
Low carb diet and exercise.  Follow met b and a1c.  

## 2015-09-10 NOTE — Assessment & Plan Note (Signed)
Blood pressure under good control.  Continue same medication regimen.  Follow pressures.  Follow metabolic panel.   

## 2015-09-10 NOTE — Assessment & Plan Note (Signed)
Abdominal ultrasound 08/05/13 - negative.  Last liver panel wnl.  Previous elevated liver enzymes with statin therapy.  Recheck liver panel next labs.

## 2015-09-28 DIAGNOSIS — H6123 Impacted cerumen, bilateral: Secondary | ICD-10-CM | POA: Diagnosis not present

## 2015-10-22 DIAGNOSIS — L720 Epidermal cyst: Secondary | ICD-10-CM | POA: Diagnosis not present

## 2015-10-22 DIAGNOSIS — L821 Other seborrheic keratosis: Secondary | ICD-10-CM | POA: Diagnosis not present

## 2015-10-22 DIAGNOSIS — D1800 Hemangioma unspecified site: Secondary | ICD-10-CM | POA: Diagnosis not present

## 2015-11-20 DIAGNOSIS — L720 Epidermal cyst: Secondary | ICD-10-CM | POA: Diagnosis not present

## 2015-11-20 DIAGNOSIS — D485 Neoplasm of uncertain behavior of skin: Secondary | ICD-10-CM | POA: Diagnosis not present

## 2015-12-04 ENCOUNTER — Ambulatory Visit
Admission: RE | Admit: 2015-12-04 | Discharge: 2015-12-04 | Disposition: A | Discharge status: Home / Self Care | Payer: BLUE CROSS/BLUE SHIELD | Source: Ambulatory Visit | Attending: Internal Medicine | Admitting: Internal Medicine

## 2015-12-04 ENCOUNTER — Other Ambulatory Visit: Payer: Self-pay | Admitting: Internal Medicine

## 2015-12-04 DIAGNOSIS — R928 Other abnormal and inconclusive findings on diagnostic imaging of breast: Secondary | ICD-10-CM

## 2015-12-04 DIAGNOSIS — Z1231 Encounter for screening mammogram for malignant neoplasm of breast: Secondary | ICD-10-CM

## 2015-12-05 ENCOUNTER — Other Ambulatory Visit: Payer: Self-pay | Admitting: Internal Medicine

## 2015-12-06 ENCOUNTER — Other Ambulatory Visit: Payer: Self-pay | Admitting: Internal Medicine

## 2015-12-06 DIAGNOSIS — R928 Other abnormal and inconclusive findings on diagnostic imaging of breast: Secondary | ICD-10-CM

## 2015-12-10 ENCOUNTER — Ambulatory Visit
Admission: RE | Admit: 2015-12-10 | Discharge: 2015-12-10 | Disposition: A | Discharge status: Home / Self Care | Payer: BLUE CROSS/BLUE SHIELD | Source: Ambulatory Visit | Attending: Internal Medicine | Admitting: Internal Medicine

## 2015-12-10 DIAGNOSIS — R928 Other abnormal and inconclusive findings on diagnostic imaging of breast: Secondary | ICD-10-CM

## 2016-01-05 ENCOUNTER — Other Ambulatory Visit: Payer: Self-pay | Admitting: Internal Medicine

## 2016-02-26 ENCOUNTER — Other Ambulatory Visit (INDEPENDENT_AMBULATORY_CARE_PROVIDER_SITE_OTHER): Payer: BLUE CROSS/BLUE SHIELD

## 2016-02-26 DIAGNOSIS — K7689 Other specified diseases of liver: Secondary | ICD-10-CM

## 2016-02-26 DIAGNOSIS — I10 Essential (primary) hypertension: Secondary | ICD-10-CM

## 2016-02-26 DIAGNOSIS — R739 Hyperglycemia, unspecified: Secondary | ICD-10-CM | POA: Diagnosis not present

## 2016-02-26 DIAGNOSIS — E7800 Pure hypercholesterolemia, unspecified: Secondary | ICD-10-CM

## 2016-02-26 DIAGNOSIS — R945 Abnormal results of liver function studies: Secondary | ICD-10-CM

## 2016-02-26 LAB — BASIC METABOLIC PANEL
BUN: 21 mg/dL (ref 6–23)
CO2: 30 mEq/L (ref 19–32)
Calcium: 9.3 mg/dL (ref 8.4–10.5)
Chloride: 104 mEq/L (ref 96–112)
Creatinine, Ser: 0.9 mg/dL (ref 0.40–1.20)
GFR: 80.49 mL/min (ref >60.00)
Glucose, Bld: 101 mg/dL — ABNORMAL HIGH (ref 70–99)
Potassium: 4.4 mEq/L (ref 3.5–5.1)
Sodium: 138 mEq/L (ref 135–145)

## 2016-02-26 LAB — CBC WITH DIFFERENTIAL/PLATELET
Basophils Absolute: 0 10*3/uL (ref 0.0–0.1)
Basophils Relative: 0.6 % (ref 0.0–3.0)
Eosinophils Absolute: 0.1 10*3/uL (ref 0.0–0.7)
Eosinophils Relative: 1.8 % (ref 0.0–5.0)
HCT: 44.8 % (ref 36.0–46.0)
Hemoglobin: 15.1 g/dL — ABNORMAL HIGH (ref 12.0–15.0)
Lymphocytes Relative: 36.2 % (ref 12.0–46.0)
Lymphs Abs: 1.5 10*3/uL (ref 0.7–4.0)
MCHC: 33.7 g/dL (ref 30.0–36.0)
MCV: 86 fl (ref 78.0–100.0)
Monocytes Absolute: 0.3 10*3/uL (ref 0.1–1.0)
Monocytes Relative: 6.6 % (ref 3.0–12.0)
Neutro Abs: 2.2 10*3/uL (ref 1.4–7.7)
Neutrophils Relative %: 54.8 % (ref 43.0–77.0)
Platelets: 234 10*3/uL (ref 150.0–400.0)
RBC: 5.21 Mil/uL — ABNORMAL HIGH (ref 3.87–5.11)
RDW: 13.5 % (ref 11.5–15.5)
WBC: 4.1 10*3/uL (ref 4.0–10.5)

## 2016-02-26 LAB — LIPID PANEL
Cholesterol: 219 mg/dL — ABNORMAL HIGH (ref 0–200)
HDL: 58.7 mg/dL (ref >39.00)
LDL Cholesterol: 142 mg/dL — ABNORMAL HIGH (ref 0–99)
NonHDL: 160.26
Total CHOL/HDL Ratio: 4
Triglycerides: 91 mg/dL (ref 0.0–149.0)
VLDL: 18.2 mg/dL (ref 0.0–40.0)

## 2016-02-26 LAB — HEPATIC FUNCTION PANEL
ALT: 35 U/L (ref 0–35)
AST: 24 U/L (ref 0–37)
Albumin: 4.4 g/dL (ref 3.5–5.2)
Alkaline Phosphatase: 57 U/L (ref 39–117)
Bilirubin, Direct: 0.2 mg/dL (ref 0.0–0.3)
Total Bilirubin: 0.7 mg/dL (ref 0.2–1.2)
Total Protein: 7.1 g/dL (ref 6.0–8.3)

## 2016-02-26 LAB — TSH: TSH: 0.94 u[IU]/mL (ref 0.35–4.50)

## 2016-02-26 LAB — HEMOGLOBIN A1C: Hgb A1c MFr Bld: 5.7 % (ref 4.6–6.5)

## 2016-02-27 ENCOUNTER — Encounter: Payer: Self-pay | Admitting: Internal Medicine

## 2016-02-27 ENCOUNTER — Ambulatory Visit (INDEPENDENT_AMBULATORY_CARE_PROVIDER_SITE_OTHER): Payer: BLUE CROSS/BLUE SHIELD | Admitting: Internal Medicine

## 2016-02-27 DIAGNOSIS — R739 Hyperglycemia, unspecified: Secondary | ICD-10-CM

## 2016-02-27 DIAGNOSIS — C189 Malignant neoplasm of colon, unspecified: Secondary | ICD-10-CM

## 2016-02-27 DIAGNOSIS — E7800 Pure hypercholesterolemia, unspecified: Secondary | ICD-10-CM

## 2016-02-27 DIAGNOSIS — K7689 Other specified diseases of liver: Secondary | ICD-10-CM

## 2016-02-27 DIAGNOSIS — I10 Essential (primary) hypertension: Secondary | ICD-10-CM

## 2016-02-27 DIAGNOSIS — R945 Abnormal results of liver function studies: Secondary | ICD-10-CM

## 2016-02-27 NOTE — Progress Notes (Signed)
[name]: Susan Weeks, female   DOB: [birth year], 65 y.o.   MRN: [id number]   Subjective:    [name]: Susan Weeks, female    DOB: May 23, 1950, 65 y.o.   MRN: [id number]  HPI  Patient here for a scheduled follow up.  States she is doing well.  Tries to stay active.  Does report noticing at the end of her shift at work some cramping in her left buttock.  Notices when she bends over.  Does not notice through the day.  Does not notice if she has not been working.  No persistent pain.  No pain radiating down her leg.  No numbness or tingling.  Breathing stable.  No nausea or vomiting.  Bowels stable.     Past Medical History:  Diagnosis Date  . Abnormal liver function   . Colon cancer ([place])    adenocarcinoma of the cecum s/p partial colon resection  . Hyperlipidemia   . Hypertension    Past Surgical History:  Procedure Laterality Date  . ABDOMINAL HYSTERECTOMY  1984   secondary to bleeding and fibroid tumors  . APPENDECTOMY     Family History  Problem Relation Age of Onset  . Stroke Mother   . Hypertension Mother   . Cancer Father     Prostate   . Heart disease Brother     myocardial infarction  . Hypertension Brother   . Hypertension Sister     x4  . Breast cancer Neg Hx    Social History   Social History  . Marital status: Married    Spouse name: [name]  . Number of children: 1  . Years of education: HS   Occupational History  .  Other    [employer]   Social History Main Topics  . Smoking status: Never Smoker  . Smokeless tobacco: Never Used  . Alcohol use No  . Drug use: No  . Sexual activity: Not Asked   Other Topics Concern  . None   Social History Narrative  . None    Outpatient Encounter Prescriptions as of 02/27/2016  Medication Sig  . amLODipine (NORVASC) 5 MG tablet TAKE ONE TABLET BY MOUTH ONCE DAILY  . calcium carbonate (OS-CAL) 600 MG TABS Take 600 mg by mouth daily.   [name] lisinopril (PRINIVIL,ZESTRIL) 30 MG tablet Take 1 tablet  (30 mg total) by mouth daily.  . vitamin E 400 UNIT capsule Take 400 Units by mouth daily.    No facility-administered encounter medications on file as of 02/27/2016.     Review of Systems  Constitutional: Negative for appetite change and unexpected weight change.  HENT: Negative for congestion and sinus pressure.   Respiratory: Negative for cough, chest tightness and shortness of breath.   Cardiovascular: Negative for chest pain, palpitations and leg swelling.  Gastrointestinal: Negative for abdominal pain, diarrhea, nausea and vomiting.  Genitourinary: Negative for difficulty urinating and dysuria.  Musculoskeletal: Negative for myalgias.       Some cramping - left buttock.  Noticed after working all day.   Skin: Negative for color change and rash.  Neurological: Negative for dizziness, light-headedness and headaches.  Psychiatric/Behavioral: Negative for agitation and dysphoric mood.       Objective:     Blood pressure rechecked by me:  132/82  Physical Exam  Constitutional: She appears well-developed and well-nourished. No distress.  HENT:  Nose: Nose normal.  Mouth/Throat: Oropharynx is clear and moist.  Neck: Neck supple. No thyromegaly present.  Cardiovascular:  Normal rate and regular rhythm.   Pulmonary/Chest: Breath sounds normal. No respiratory distress. She has no wheezes.  Abdominal: Soft. Bowel sounds are normal. There is no tenderness.  Musculoskeletal: She exhibits no edema or tenderness.  No pain to palpation over her buttock.  No pain with straight leg raise.  No motor weakness.    Lymphadenopathy:    She has no cervical adenopathy.  Skin: No rash noted. No erythema.  Psychiatric: She has a normal mood and affect. Her behavior is normal.    BP 122/80   Pulse 83   Temp 98.5 F (36.9 C) (Oral)   Ht 5\' 3"  (1.6 m)   Wt 168 lb 12.8 oz (76.6 kg)   LMP 06/28/1982   SpO2 95%   BMI 29.90 kg/m  Wt Readings from Last 3 Encounters:  02/27/16 168 lb 12.8 oz  (76.6 kg)  08/28/15 170 lb 3.2 oz (77.2 kg)  02/22/15 174 lb 6 oz (79.1 kg)     Lab Results  Component Value Date   WBC 4.1 02/26/2016   HGB 15.1 (H) 02/26/2016   HCT 44.8 02/26/2016   PLT 234.0 02/26/2016   GLUCOSE 101 (H) 02/26/2016   CHOL 219 (H) 02/26/2016   TRIG 91.0 02/26/2016   HDL 58.70 02/26/2016   LDLDIRECT 150.8 03/02/2013   LDLCALC 142 (H) 02/26/2016   ALT 35 02/26/2016   AST 24 02/26/2016   NA 138 02/26/2016   K 4.4 02/26/2016   CL 104 02/26/2016   CREATININE 0.90 02/26/2016   BUN 21 02/26/2016   CO2 30 02/26/2016   TSH 0.94 02/26/2016   HGBA1C 5.7 02/26/2016    Mm Diag Breast Tomo Uni Left  Result Date: 12/10/2015 CLINICAL DATA:  Patient returns today to evaluate a possible left breast distortion questioned on recent screening mammogram. EXAM: 2D DIGITAL DIAGNOSTIC UNILATERAL LEFT MAMMOGRAM WITH CAD AND ADJUNCT TOMO COMPARISON:  Previous exams including recent screening mammogram dated 12/04/2015. ACR Breast Density Category b: There are scattered areas of fibroglandular density. FINDINGS: On today's additional views of the left breast with spot compression and 3D tomosynthesis, there is no persistent distortion within the upper-outer quadrant indicating superimposition of normal dense fibroglandular tissues. There are no dominant masses, suspicious calcifications or secondary signs of malignancy identified in the left breast on today's exam. Mammographic images were processed with CAD. IMPRESSION: No evidence of malignancy. Patient may return to routine annual bilateral screening mammogram schedule. RECOMMENDATION: Screening mammogram in one year.(Code:SM-B-01Y) I have discussed the findings and recommendations with the patient. Results were also provided in writing at the conclusion of the visit. If applicable, a reminder letter will be sent to the patient regarding the next appointment. BI-RADS CATEGORY  1: Negative. Electronically Signed   By: 02/03/2016 M.D.   On:  12/10/2015 15:17       Assessment & Plan:   Problem List Items Addressed This Visit    Abnormal liver function    Abdominal ultrasound previously - negative.  Follow liver function tests.        Colon cancer ([facility])    Colonoscopy 02/2013.  Recommended f/u in three years.  Is scheduled f/u with GI.       Essential hypertension, benign    Blood pressure under good control.  Continue same medication regimen.  Follow pressures.  Follow metabolic panel.        Hyperglycemia    Low carb diet and exercise.  Follow met b and a1c.   Lab Results  Component  Value Date   HGBA1C 5.7 02/26/2016        Pure hypercholesterolemia    Low cholesterol diet and exercise.  Recent LDL improved some.  142 now. Discussed with her today.  Discussed another trial of statin medication.  Had increased LFTs with previous statin.  Wants to continue diet and exercise for now - given improvement.  Follow.         Other Visit Diagnoses   None.      [name], MD

## 2016-02-27 NOTE — Progress Notes (Signed)
Pre visit review using our clinic review tool, if applicable. No additional management support is needed unless otherwise documented below in the visit note. 

## 2016-03-01 ENCOUNTER — Other Ambulatory Visit: Payer: Self-pay | Admitting: Internal Medicine

## 2016-03-03 ENCOUNTER — Encounter: Payer: Self-pay | Admitting: Internal Medicine

## 2016-03-03 NOTE — Assessment & Plan Note (Signed)
Colonoscopy 02/2013.  Recommended f/u in three years.  Is scheduled f/u with GI.

## 2016-03-03 NOTE — Assessment & Plan Note (Signed)
Low carb diet and exercise.  Follow met b and a1c.   Lab Results  Component Value Date   HGBA1C 5.7 02/26/2016

## 2016-03-03 NOTE — Assessment & Plan Note (Signed)
Low cholesterol diet and exercise.  Recent LDL improved some.  142 now. Discussed with her today.  Discussed another trial of statin medication.  Had increased LFTs with previous statin.  Wants to continue diet and exercise for now - given improvement.  Follow.

## 2016-03-03 NOTE — Assessment & Plan Note (Signed)
Abdominal ultrasound previously - negative.  Follow liver function tests.

## 2016-03-03 NOTE — Assessment & Plan Note (Signed)
Blood pressure under good control.  Continue same medication regimen.  Follow pressures.  Follow metabolic panel.   

## 2016-04-04 DIAGNOSIS — Z85038 Personal history of other malignant neoplasm of large intestine: Secondary | ICD-10-CM | POA: Diagnosis not present

## 2016-04-04 DIAGNOSIS — R110 Nausea: Secondary | ICD-10-CM | POA: Diagnosis not present

## 2016-04-14 DIAGNOSIS — H6123 Impacted cerumen, bilateral: Secondary | ICD-10-CM | POA: Diagnosis not present

## 2016-06-02 ENCOUNTER — Ambulatory Visit (INDEPENDENT_AMBULATORY_CARE_PROVIDER_SITE_OTHER): Payer: BLUE CROSS/BLUE SHIELD | Admitting: Internal Medicine

## 2016-06-02 ENCOUNTER — Encounter: Payer: Self-pay | Admitting: Internal Medicine

## 2016-06-02 VITALS — BP 118/72 | HR 88 | Temp 98.6°F | Resp 16 | Ht 63.0 in | Wt 170.2 lb

## 2016-06-02 DIAGNOSIS — Z23 Encounter for immunization: Secondary | ICD-10-CM | POA: Diagnosis not present

## 2016-06-02 DIAGNOSIS — R739 Hyperglycemia, unspecified: Secondary | ICD-10-CM | POA: Diagnosis not present

## 2016-06-02 DIAGNOSIS — I10 Essential (primary) hypertension: Secondary | ICD-10-CM

## 2016-06-02 DIAGNOSIS — E7800 Pure hypercholesterolemia, unspecified: Secondary | ICD-10-CM | POA: Diagnosis not present

## 2016-06-02 DIAGNOSIS — C189 Malignant neoplasm of colon, unspecified: Secondary | ICD-10-CM

## 2016-06-02 DIAGNOSIS — K7689 Other specified diseases of liver: Secondary | ICD-10-CM

## 2016-06-02 DIAGNOSIS — R945 Abnormal results of liver function studies: Secondary | ICD-10-CM

## 2016-06-02 NOTE — Progress Notes (Signed)
Pre-visit discussion using our clinic review tool. No additional management support is needed unless otherwise documented below in the visit note.  

## 2016-06-02 NOTE — Progress Notes (Signed)
[name]: Susan Weeks, female   DOB: 1950/10/25, 66 y.o.   MRN: [id number]   Subjective:    [name]: Susan Weeks, female    DOB: 1950-08-07, 66 y.o.   MRN: [id number]  HPI  Patient here for a scheduled follow up.  She is doing well.  Tries to stay active.  No chest pain.  No sob.  No acid reflux.  She is trying to exercise some.  Plans to do more.  Is trying to watch her diet.  Discussed low carb diet.  Information given.  No back/buttock or leg pain.  No abdominal pain or cramping.  Bowels moving.     Past Medical History:  Diagnosis Date  . Abnormal liver function   . Colon cancer ([place])    adenocarcinoma of the cecum s/p partial colon resection  . Hyperlipidemia   . Hypertension    Past Surgical History:  Procedure Laterality Date  . ABDOMINAL HYSTERECTOMY  1984   secondary to bleeding and fibroid tumors  . APPENDECTOMY     Family History  Problem Relation Age of Onset  . Stroke Mother   . Hypertension Mother   . Cancer Father     Prostate   . Heart disease Brother     myocardial infarction  . Hypertension Brother   . Hypertension Sister     x4  . Breast cancer Neg Hx    Social History   Social History  . Marital status: Married    Spouse name: [name]  . Number of children: 1  . Years of education: HS   Occupational History  .  Other    [employer]   Social History Main Topics  . Smoking status: Never Smoker  . Smokeless tobacco: Never Used  . Alcohol use No  . Drug use: No  . Sexual activity: Not Asked   Other Topics Concern  . None   Social History Narrative  . None    Outpatient Encounter Prescriptions as of 06/02/2016  Medication Sig  . amLODipine (NORVASC) 5 MG tablet TAKE ONE TABLET BY MOUTH ONCE DAILY  . calcium carbonate (OS-CAL) 600 MG TABS Take 600 mg by mouth daily.   [name] lisinopril (PRINIVIL,ZESTRIL) 30 MG tablet TAKE ONE TABLET BY MOUTH ONCE DAILY  . vitamin E 400 UNIT capsule Take 400 Units by mouth daily.    No  facility-administered encounter medications on file as of 06/02/2016.     Review of Systems  Constitutional: Negative for appetite change and unexpected weight change.  HENT: Negative for congestion and sinus pressure.   Respiratory: Negative for cough, chest tightness and shortness of breath.   Cardiovascular: Negative for chest pain, palpitations and leg swelling.  Gastrointestinal: Negative for abdominal pain, diarrhea, nausea and vomiting.  Genitourinary: Negative for difficulty urinating and dysuria.  Musculoskeletal: Negative for back pain and joint swelling.  Skin: Negative for color change and rash.  Neurological: Negative for dizziness, light-headedness and headaches.  Psychiatric/Behavioral: Negative for agitation and dysphoric mood.       Objective:     Blood pressure rechecked by me:  130/78  Physical Exam  Constitutional: She appears well-developed and well-nourished. No distress.  HENT:  Nose: Nose normal.  Mouth/Throat: Oropharynx is clear and moist.  Neck: Neck supple. No thyromegaly present.  Cardiovascular: Normal rate and regular rhythm.   Pulmonary/Chest: Breath sounds normal. No respiratory distress. She has no wheezes.  Abdominal: Soft. Bowel sounds are normal. There is no tenderness.  Musculoskeletal: She  exhibits no edema or tenderness.  Lymphadenopathy:    She has no cervical adenopathy.  Skin: No rash noted. No erythema.  Psychiatric: She has a normal mood and affect. Her behavior is normal.    BP 118/72 (BP Location: Left Arm, Patient Position: Sitting, Cuff Size: Large)   Pulse 88   Temp 98.6 F (37 C) (Oral)   Resp 16   Ht 5' 3"  (1.6 m)   Wt 170 lb 3.2 oz (77.2 kg)   LMP 06/28/1982   SpO2 94%   BMI 30.15 kg/m  Wt Readings from Last 3 Encounters:  06/02/16 170 lb 3.2 oz (77.2 kg)  02/27/16 168 lb 12.8 oz (76.6 kg)  08/28/15 170 lb 3.2 oz (77.2 kg)     Lab Results  Component Value Date   WBC 4.1 02/26/2016   HGB 15.1 (H) 02/26/2016     HCT 44.8 02/26/2016   PLT 234.0 02/26/2016   GLUCOSE 101 (H) 02/26/2016   CHOL 219 (H) 02/26/2016   TRIG 91.0 02/26/2016   HDL 58.70 02/26/2016   LDLDIRECT 150.8 03/02/2013   LDLCALC 142 (H) 02/26/2016   ALT 35 02/26/2016   AST 24 02/26/2016   NA 138 02/26/2016   K 4.4 02/26/2016   CL 104 02/26/2016   CREATININE 0.90 02/26/2016   BUN 21 02/26/2016   CO2 30 02/26/2016   TSH 0.94 02/26/2016   HGBA1C 5.7 02/26/2016    Mm Diag Breast Tomo Uni Left  Result Date: 12/10/2015 CLINICAL DATA:  Patient returns today to evaluate a possible left breast distortion questioned on recent screening mammogram. EXAM: 2D DIGITAL DIAGNOSTIC UNILATERAL LEFT MAMMOGRAM WITH CAD AND ADJUNCT TOMO COMPARISON:  Previous exams including recent screening mammogram dated 12/04/2015. ACR Breast Density Category b: There are scattered areas of fibroglandular density. FINDINGS: On today's additional views of the left breast with spot compression and 3D tomosynthesis, there is no persistent distortion within the upper-outer quadrant indicating superimposition of normal dense fibroglandular tissues. There are no dominant masses, suspicious calcifications or secondary signs of malignancy identified in the left breast on today's exam. Mammographic images were processed with CAD. IMPRESSION: No evidence of malignancy. Patient may return to routine annual bilateral screening mammogram schedule. RECOMMENDATION: Screening mammogram in one year.(Code:SM-B-01Y) I have discussed the findings and recommendations with the patient. Results were also provided in writing at the conclusion of the visit. If applicable, a reminder letter will be sent to the patient regarding the next appointment. BI-RADS CATEGORY  1: Negative. Electronically Signed   By: Franki Cabot M.D.   On: 12/10/2015 15:17       Assessment & Plan:   Problem List Items Addressed This Visit    Abnormal liver function    Abdominal ultrasound previously checked -  negative.  Follow liver function tests.        Colon cancer ([place])    Colonoscopy 02/2013.  Is scheduled for f/u colonoscopy 07/2016.        Essential hypertension, benign    Blood pressure under good control.  Continue same medication regimen.  Follow pressures.  Follow metabolic panel.        Hyperglycemia    Low carb diet and exercise.  Follow met b and a1c.  She has adjusted her diet.  Discussed diet and exercise.        Pure hypercholesterolemia    Low cholesterol diet and exercise.  Had increased LFTs with previous statin.  Follow lipid panel.  Other Visit Diagnoses    Need for prophylactic vaccination against Streptococcus pneumoniae (pneumococcus)    -  Primary   Relevant Orders   Pneumococcal conjugate vaccine 13-valent (Completed)       [name], MD

## 2016-06-05 ENCOUNTER — Telehealth: Payer: Self-pay | Admitting: Radiology

## 2016-06-05 NOTE — Telephone Encounter (Signed)
Pt coming for labs Monday, please place orders. Thank you.  

## 2016-06-06 ENCOUNTER — Other Ambulatory Visit: Payer: Self-pay | Admitting: Internal Medicine

## 2016-06-06 DIAGNOSIS — I10 Essential (primary) hypertension: Secondary | ICD-10-CM

## 2016-06-06 DIAGNOSIS — E7800 Pure hypercholesterolemia, unspecified: Secondary | ICD-10-CM

## 2016-06-06 DIAGNOSIS — C189 Malignant neoplasm of colon, unspecified: Secondary | ICD-10-CM

## 2016-06-06 DIAGNOSIS — R739 Hyperglycemia, unspecified: Secondary | ICD-10-CM

## 2016-06-06 NOTE — Progress Notes (Signed)
Orders placed for labs

## 2016-06-06 NOTE — Telephone Encounter (Signed)
Orders placed for labs

## 2016-06-08 ENCOUNTER — Encounter: Payer: Self-pay | Admitting: Internal Medicine

## 2016-06-08 NOTE — Assessment & Plan Note (Signed)
Low cholesterol diet and exercise.  Had increased LFTs with previous statin.  Follow lipid panel.

## 2016-06-08 NOTE — Assessment & Plan Note (Signed)
Colonoscopy 02/2013.  Is scheduled for f/u colonoscopy 07/2016.

## 2016-06-08 NOTE — Assessment & Plan Note (Signed)
Low carb diet and exercise.  Follow met b and a1c.  She has adjusted her diet.  Discussed diet and exercise.

## 2016-06-08 NOTE — Assessment & Plan Note (Signed)
Blood pressure under good control.  Continue same medication regimen.  Follow pressures.  Follow metabolic panel.   

## 2016-06-08 NOTE — Assessment & Plan Note (Signed)
Abdominal ultrasound previously checked - negative.  Follow liver function tests.

## 2016-06-09 ENCOUNTER — Other Ambulatory Visit (INDEPENDENT_AMBULATORY_CARE_PROVIDER_SITE_OTHER): Payer: BLUE CROSS/BLUE SHIELD

## 2016-06-09 DIAGNOSIS — R739 Hyperglycemia, unspecified: Secondary | ICD-10-CM | POA: Diagnosis not present

## 2016-06-09 DIAGNOSIS — C189 Malignant neoplasm of colon, unspecified: Secondary | ICD-10-CM

## 2016-06-09 DIAGNOSIS — E7800 Pure hypercholesterolemia, unspecified: Secondary | ICD-10-CM | POA: Diagnosis not present

## 2016-06-09 DIAGNOSIS — I10 Essential (primary) hypertension: Secondary | ICD-10-CM

## 2016-06-09 LAB — HEPATIC FUNCTION PANEL
ALT: 29 U/L (ref 0–35)
AST: 20 U/L (ref 0–37)
Albumin: 4.4 g/dL (ref 3.5–5.2)
Alkaline Phosphatase: 62 U/L (ref 39–117)
Bilirubin, Direct: 0.1 mg/dL (ref 0.0–0.3)
Total Bilirubin: 0.6 mg/dL (ref 0.2–1.2)
Total Protein: 7 g/dL (ref 6.0–8.3)

## 2016-06-09 LAB — BASIC METABOLIC PANEL
BUN: 17 mg/dL (ref 6–23)
CO2: 26 mEq/L (ref 19–32)
Calcium: 9.2 mg/dL (ref 8.4–10.5)
Chloride: 106 mEq/L (ref 96–112)
Creatinine, Ser: 0.8 mg/dL (ref 0.40–1.20)
GFR: 92.13 mL/min (ref >60.00)
Glucose, Bld: 109 mg/dL — ABNORMAL HIGH (ref 70–99)
Potassium: 4.1 mEq/L (ref 3.5–5.1)
Sodium: 140 mEq/L (ref 135–145)

## 2016-06-09 LAB — LIPID PANEL
Cholesterol: 230 mg/dL — ABNORMAL HIGH (ref 0–200)
HDL: 54.3 mg/dL (ref >39.00)
LDL Cholesterol: 156 mg/dL — ABNORMAL HIGH (ref 0–99)
NonHDL: 175.25
Total CHOL/HDL Ratio: 4
Triglycerides: 98 mg/dL (ref 0.0–149.0)
VLDL: 19.6 mg/dL (ref 0.0–40.0)

## 2016-06-09 LAB — HEMOGLOBIN A1C: Hgb A1c MFr Bld: 5.8 % (ref 4.6–6.5)

## 2016-06-10 LAB — CEA: CEA: 3 ng/mL — ABNORMAL HIGH

## 2016-06-13 ENCOUNTER — Telehealth: Payer: Self-pay

## 2016-06-13 ENCOUNTER — Other Ambulatory Visit: Payer: Self-pay

## 2016-06-13 DIAGNOSIS — E7800 Pure hypercholesterolemia, unspecified: Secondary | ICD-10-CM

## 2016-06-13 MED ORDER — ROSUVASTATIN CALCIUM 5 MG PO TABS: ORAL_TABLET | ORAL | 1 refills | Status: DC

## 2016-06-13 NOTE — Telephone Encounter (Addendum)
Notified pt of lab results. Pt is okay starting Crestor. Would like rx sent to [facility] on [address].     Notes Recorded by [name], MD on 06/10/2016 at 3:07 AM EST Please call pt and notify her that her cholesterol levels have increased some when compared to previous check. Given persistent increase, I would like to try her on a cholesterol medication. (had previous held on retrying given increased liver function). If agreeable, I would like to try low dose crestor and follow liver function closely. If agreeable, I would like to start her on crestor 5mg  q Monday, Wednesday and Friday. Will need liver panel checked 3 weeks after starting the medication. Overall sugar control stable. Kidney function tests and liver function tests are wnl.

## 2016-06-13 NOTE — Telephone Encounter (Signed)
rx sent in for crestor.  Liver panel ordered.  Please schedule non fasting lab in 3 weeks and notify pt of appt date and time.

## 2016-06-17 NOTE — Telephone Encounter (Signed)
Lm on vm for pt to call back and sched non fasting labs the 2nd week in March

## 2016-07-03 ENCOUNTER — Other Ambulatory Visit: Payer: Self-pay | Admitting: Internal Medicine

## 2016-07-10 ENCOUNTER — Other Ambulatory Visit (INDEPENDENT_AMBULATORY_CARE_PROVIDER_SITE_OTHER): Payer: BLUE CROSS/BLUE SHIELD

## 2016-07-10 ENCOUNTER — Encounter: Payer: Self-pay | Admitting: Internal Medicine

## 2016-07-10 DIAGNOSIS — E7800 Pure hypercholesterolemia, unspecified: Secondary | ICD-10-CM

## 2016-07-10 LAB — HEPATIC FUNCTION PANEL
ALT: 33 U/L (ref 0–35)
AST: 24 U/L (ref 0–37)
Albumin: 4.3 g/dL (ref 3.5–5.2)
Alkaline Phosphatase: 55 U/L (ref 39–117)
Bilirubin, Direct: 0.2 mg/dL (ref 0.0–0.3)
Total Bilirubin: 0.9 mg/dL (ref 0.2–1.2)
Total Protein: 7 g/dL (ref 6.0–8.3)

## 2016-07-11 ENCOUNTER — Telehealth: Payer: Self-pay

## 2016-07-11 ENCOUNTER — Other Ambulatory Visit: Payer: Self-pay | Admitting: Internal Medicine

## 2016-07-11 DIAGNOSIS — E7800 Pure hypercholesterolemia, unspecified: Secondary | ICD-10-CM

## 2016-07-11 NOTE — Progress Notes (Signed)
Order placed for f/u liver panel.  

## 2016-07-11 NOTE — Telephone Encounter (Signed)
Left message to return call to our office.  

## 2016-07-11 NOTE — Telephone Encounter (Signed)
-----   Message from [name], MD sent at 07/11/2016  4:53 AM EST ----- Notify pt that her liver function tests are wnl.  Need to recheck in 3-4 weeks to confirm remaining normal on current medication.

## 2016-07-11 NOTE — Telephone Encounter (Signed)
Spoke to patient results given and app made.

## 2016-07-15 DIAGNOSIS — H25813 Combined forms of age-related cataract, bilateral: Secondary | ICD-10-CM | POA: Diagnosis not present

## 2016-07-25 ENCOUNTER — Encounter: Payer: Self-pay | Admitting: *Deleted

## 2016-07-28 ENCOUNTER — Encounter
Admission: RE | Disposition: A | Discharge status: Home / Self Care | Payer: Self-pay | Source: Ambulatory Visit | Attending: Gastroenterology

## 2016-07-28 ENCOUNTER — Ambulatory Visit: Payer: BLUE CROSS/BLUE SHIELD | Admitting: Anesthesiology

## 2016-07-28 ENCOUNTER — Ambulatory Visit
Admission: RE | Admit: 2016-07-28 | Discharge: 2016-07-28 | Disposition: A | Discharge status: Home / Self Care | Payer: BLUE CROSS/BLUE SHIELD | Source: Ambulatory Visit | Attending: Gastroenterology | Admitting: Gastroenterology

## 2016-07-28 ENCOUNTER — Encounter: Payer: Self-pay | Admitting: *Deleted

## 2016-07-28 DIAGNOSIS — Z85038 Personal history of other malignant neoplasm of large intestine: Secondary | ICD-10-CM | POA: Diagnosis not present

## 2016-07-28 DIAGNOSIS — Z980 Intestinal bypass and anastomosis status: Secondary | ICD-10-CM | POA: Insufficient documentation

## 2016-07-28 DIAGNOSIS — E785 Hyperlipidemia, unspecified: Secondary | ICD-10-CM | POA: Insufficient documentation

## 2016-07-28 DIAGNOSIS — Z1211 Encounter for screening for malignant neoplasm of colon: Secondary | ICD-10-CM | POA: Insufficient documentation

## 2016-07-28 DIAGNOSIS — K219 Gastro-esophageal reflux disease without esophagitis: Secondary | ICD-10-CM | POA: Diagnosis not present

## 2016-07-28 DIAGNOSIS — Z86010 Personal history of colonic polyps: Secondary | ICD-10-CM | POA: Insufficient documentation

## 2016-07-28 DIAGNOSIS — Z87891 Personal history of nicotine dependence: Secondary | ICD-10-CM | POA: Diagnosis not present

## 2016-07-28 DIAGNOSIS — I10 Essential (primary) hypertension: Secondary | ICD-10-CM | POA: Insufficient documentation

## 2016-07-28 HISTORY — PX: COLONOSCOPY WITH PROPOFOL: SHX5780

## 2016-07-28 HISTORY — DX: Personal history of colon polyps, unspecified: Z86.0100

## 2016-07-28 HISTORY — DX: Personal history of colonic polyps: Z86.010

## 2016-07-28 SURGERY — COLONOSCOPY WITH PROPOFOL
Anesthesia: General

## 2016-07-28 MED ORDER — PROPOFOL 10 MG/ML IV BOLUS
INTRAVENOUS | Status: DC | PRN
  Administered 2016-07-28: 90 mg via INTRAVENOUS

## 2016-07-28 MED ORDER — PROPOFOL 500 MG/50ML IV EMUL
INTRAVENOUS | Status: AC
  Filled 2016-07-28: qty 50

## 2016-07-28 MED ORDER — SODIUM CHLORIDE 0.9 % IV SOLN
INTRAVENOUS | Status: DC
  Administered 2016-07-28: 15:00:00 via INTRAVENOUS

## 2016-07-28 MED ORDER — SODIUM CHLORIDE 0.9 % IV SOLN: INTRAVENOUS | Status: DC

## 2016-07-28 MED ORDER — PROPOFOL 500 MG/50ML IV EMUL
INTRAVENOUS | Status: DC | PRN
  Administered 2016-07-28: 100 ug/kg/min via INTRAVENOUS

## 2016-07-28 NOTE — H&P (Signed)
Outpatient short stay form Pre-procedure 07/28/2016 4:19 PM [name] MD  Primary Physician: No primary care physician  Reason for visit:  Colonoscopy  History of present illness:  Patient is a 66 year old female presenting today for a follow-up colonoscopy. She has a personal history of colon cancer. She also had to have a revision of the anastomosis. She tolerated her prep well. She takes no aspirin or blood thinning agents.     Current Facility-Administered Medications:  .  0.9 %  sodium chloride infusion, , Intravenous, Continuous, [name], MD, Last Rate: 20 mL/hr at 07/28/16 1522 .  0.9 %  sodium chloride infusion, , Intravenous, Continuous, [name], MD  Prescriptions Prior to Admission  Medication Sig Dispense Refill Last Dose  . amLODipine (NORVASC) 5 MG tablet TAKE ONE TABLET BY MOUTH ONCE DAILY 90 tablet 1 07/28/2016 at 0600  . calcium carbonate (OS-CAL) 600 MG TABS Take 600 mg by mouth daily.    Past Week at Unknown time  . lisinopril (PRINIVIL,ZESTRIL) 30 MG tablet TAKE ONE TABLET BY MOUTH ONCE DAILY 90 tablet 3 07/28/2016 at 1200  . rosuvastatin (CRESTOR) 5 MG tablet Take one tablet by mouth q Monday, Wednesday and Friday. 30 tablet 1 07/27/2016 at Unknown time  . vitamin E 400 UNIT capsule Take 400 Units by mouth daily.    Past Week at Unknown time  . ondansetron (ZOFRAN) 4 MG tablet Take 4 mg by mouth every 8 (eight) hours as needed for nausea or vomiting.   Not Taking at Unknown time     Allergies  Allergen Reactions  . Z-Pak [Azithromycin] Other (See Comments)    Caused Emesis     Past Medical History:  Diagnosis Date  . Abnormal liver function   . Colon cancer ([place])    adenocarcinoma of the cecum s/p partial colon resection  . History of colon polyps   . Hyperlipidemia   . Hypertension     Review of systems:      Physical Exam    Heart and lungs: Regular rate and rhythm without rub or gallop, lungs are bilaterally clear.   HEENT: Normocephalic atraumatic eyes are anicteric    Other:     Pertinant exam for procedure: Soft nontender nondistended bowel sounds positive normoactive.    Planned proceedures: Colonoscopy and indicated procedures. I have discussed the risks benefits and complications of procedures to include not limited to bleeding, infection, perforation and the risk of sedation and the patient wishes to proceed.    [name], MD Gastroenterology 07/28/2016  4:19 PM

## 2016-07-28 NOTE — Anesthesia Preprocedure Evaluation (Signed)
Anesthesia Evaluation  Patient identified by MRN, date of birth, ID band Patient awake    Reviewed: Allergy & Precautions, H&P , NPO status , Patient's Chart, lab work & pertinent test results  History of Anesthesia Complications Negative for: history of anesthetic complications  Airway Mallampati: III  TM Distance: >3 FB Neck ROM: limited    Dental  (+) Poor Dentition, Chipped, Caps   Pulmonary neg shortness of breath, former smoker,    Pulmonary exam normal breath sounds clear to auscultation       Cardiovascular Exercise Tolerance: Good hypertension, (-) angina(-) Past MI and (-) DOE Normal cardiovascular exam Rhythm:regular Rate:Normal     Neuro/Psych negative neurological ROS  negative psych ROS   GI/Hepatic negative GI ROS, Neg liver ROS, neg GERD  ,  Endo/Other  negative endocrine ROS  Renal/GU negative Renal ROS  negative genitourinary   Musculoskeletal   Abdominal   Peds  Hematology negative hematology ROS (+)   Anesthesia Other Findings Past Medical History: No date: Abnormal liver function No date: Colon cancer ([place])     Comment: adenocarcinoma of the cecum s/p partial colon               resection No date: History of colon polyps No date: Hyperlipidemia No date: Hypertension  Past Surgical History: 1984: ABDOMINAL HYSTERECTOMY     Comment: secondary to bleeding and fibroid tumors No date: APPENDECTOMY  BMI    Body Mass Index:  29.76 kg/m      Reproductive/Obstetrics negative OB ROS                             Anesthesia Physical Anesthesia Plan  ASA: III  Anesthesia Plan: General   Post-op Pain Management:    Induction:   Airway Management Planned:   Additional Equipment:   Intra-op Plan:   Post-operative Plan:   Informed Consent: I have reviewed the patients History and Physical, chart, labs and discussed the procedure including the risks,  benefits and alternatives for the proposed anesthesia with the patient or authorized representative who has indicated his/her understanding and acceptance.   Dental Advisory Given  Plan Discussed with: Anesthesiologist, CRNA and Surgeon  Anesthesia Plan Comments:         Anesthesia Quick Evaluation

## 2016-07-28 NOTE — Op Note (Signed)
[facility]enter[facility] Patient Name: Susan Weeks Procedure Date: 07/28/2016 4:21 PM MRN: [id number] Account #: [id number] Date of Birth: 1951-03-13 Admit Type: Outpatient Age: 66 Room: [facility] ENDO ROOM 3 Gender: Female Note Status: Finalized Procedure:            Colonoscopy Indications:          High risk colon cancer surveillance: Personal history                        of colon cancer, Personal history of malignant neoplasm                        of the colon Providers:            [name], MD Referring MD:         [name], MD (Referring MD) Medicines:            Monitored Anesthesia Care Complications:        No immediate complications. Procedure:            Pre-Anesthesia Assessment:                       - ASA Grade Assessment: III - A patient with severe                        systemic disease.                       After obtaining informed consent, the colonoscope was                        passed under direct vision. Throughout the procedure,                        the patient's blood pressure, pulse, and oxygen                        saturations were monitored continuously. The                        Colonoscope was introduced through the anus and                        advanced to the the ileocolonic anastomosis. The                        colonoscopy was performed without difficulty. The                        patient tolerated the procedure well. The quality of                        the bowel preparation was good. Findings:      There was evidence of a prior functional end-to-end ileo-colonic       anastomosis in the proximal transverse colon. This was patent and was       characterized by healthy appearing mucosa and an intact staple line. The       anastomosis was traversed.      The exam was otherwise without abnormality.      The digital rectal exam was normal. Impression:           -  Patent functional end-to-end  ileo-colonic                        anastomosis, characterized by healthy appearing mucosa                        and an intact staple line.                       - The examination was otherwise normal.                       - No specimens collected. Recommendation:       - Discharge patient to home.                       - Repeat colonoscopy in 3 years for surveillance based                        on personal history of colon cancer. Procedure Code(s):    --- Professional ---                       [phone], Colonoscopy, flexible; diagnostic, including                        collection of specimen(s) by brushing or washing, when                        performed (separate procedure) Diagnosis Code(s):    --- Professional ---                       T26.712, Personal history of other malignant neoplasm                        of large intestine                       Z98.0, Intestinal bypass and anastomosis status CPT copyright 2016 American Medical Association. All rights reserved. The codes documented in this report are preliminary and upon coder review may  be revised to meet current compliance requirements. [name], MD 07/28/2016 4:57:59 PM This report has been signed electronically. Number of Addenda: 0 Note Initiated On: 07/28/2016 4:21 PM Scope Withdrawal Time: 0 hours 4 minutes 53 seconds  Total Procedure Duration: 0 hours 17 minutes 51 seconds       [facility]

## 2016-07-28 NOTE — Transfer of Care (Signed)
Immediate Anesthesia Transfer of Care Note  Patient: Susan Weeks  Procedure(s) Performed: Procedure(s): COLONOSCOPY WITH PROPOFOL (N/A)  Patient Location: PACU and Endoscopy Unit  Anesthesia Type:General  Level of Consciousness: patient cooperative and lethargic  Airway & Oxygen Therapy: Patient Spontanous Breathing and Patient connected to nasal cannula oxygen  Post-op Assessment: Report given to RN and Post -op Vital signs reviewed and stable  Post vital signs: Reviewed and stable  Last Vitals:  Vitals:   07/28/16 1502 07/28/16 1659  BP: (!) 146/73 106/86  Pulse: 84 86  Resp: 16 18  Temp: (!) 36 C 36.3 C    Last Pain:  Vitals:   07/28/16 1502  TempSrc: Tympanic         Complications: No apparent anesthesia complications

## 2016-07-28 NOTE — Anesthesia Post-op Follow-up Note (Cosign Needed)
Anesthesia QCDR form completed.        

## 2016-07-28 NOTE — Anesthesia Postprocedure Evaluation (Signed)
Anesthesia Post Note  Patient: Susan Weeks  Procedure(s) Performed: Procedure(s) (LRB): COLONOSCOPY WITH PROPOFOL (N/A)  Patient location during evaluation: Endoscopy Anesthesia Type: General Level of consciousness: awake and alert Pain management: pain level controlled Vital Signs Assessment: post-procedure vital signs reviewed and stable Respiratory status: spontaneous breathing, nonlabored ventilation, respiratory function stable and patient connected to nasal cannula oxygen Cardiovascular status: blood pressure returned to baseline and stable Postop Assessment: no signs of nausea or vomiting Anesthetic complications: no     Last Vitals:  Vitals:   07/28/16 1659 07/28/16 1700  BP: 106/86 (!) 106/49  Pulse: 86   Resp: 18   Temp: 36.3 C 36.5 C    Last Pain:  Vitals:   07/28/16 1700  TempSrc: Tympanic                 Precious Haws Tajia Szeliga

## 2016-07-29 ENCOUNTER — Encounter: Payer: Self-pay | Admitting: Gastroenterology

## 2016-08-06 ENCOUNTER — Other Ambulatory Visit (INDEPENDENT_AMBULATORY_CARE_PROVIDER_SITE_OTHER): Payer: BLUE CROSS/BLUE SHIELD

## 2016-08-06 DIAGNOSIS — E7800 Pure hypercholesterolemia, unspecified: Secondary | ICD-10-CM

## 2016-08-07 LAB — HEPATIC FUNCTION PANEL
ALT: 29 U/L (ref 0–35)
AST: 21 U/L (ref 0–37)
Albumin: 4.5 g/dL (ref 3.5–5.2)
Alkaline Phosphatase: 63 U/L (ref 39–117)
Bilirubin, Direct: 0.1 mg/dL (ref 0.0–0.3)
Total Bilirubin: 0.7 mg/dL (ref 0.2–1.2)
Total Protein: 7.5 g/dL (ref 6.0–8.3)

## 2016-08-08 ENCOUNTER — Telehealth: Payer: Self-pay

## 2016-08-08 ENCOUNTER — Other Ambulatory Visit: Payer: Self-pay | Admitting: Internal Medicine

## 2016-08-08 ENCOUNTER — Other Ambulatory Visit: Payer: BLUE CROSS/BLUE SHIELD

## 2016-08-08 DIAGNOSIS — E7800 Pure hypercholesterolemia, unspecified: Secondary | ICD-10-CM

## 2016-08-08 NOTE — Telephone Encounter (Signed)
Patient informed app made.

## 2016-08-08 NOTE — Telephone Encounter (Signed)
Left message to return call to our office.  

## 2016-08-08 NOTE — Progress Notes (Signed)
Order placed for f/u liver panel check.  

## 2016-08-08 NOTE — Telephone Encounter (Signed)
-----   Message from [name], MD sent at 08/08/2016  5:46 AM EDT ----- Notify pt that her liver function tests are wnl.  Will need to continue to follow since on cholesterol medication.  Recheck liver panel in 4 weeks.

## 2016-09-05 ENCOUNTER — Other Ambulatory Visit (INDEPENDENT_AMBULATORY_CARE_PROVIDER_SITE_OTHER): Payer: BLUE CROSS/BLUE SHIELD

## 2016-09-05 ENCOUNTER — Other Ambulatory Visit: Payer: BLUE CROSS/BLUE SHIELD

## 2016-09-05 DIAGNOSIS — E7800 Pure hypercholesterolemia, unspecified: Secondary | ICD-10-CM | POA: Diagnosis not present

## 2016-09-05 LAB — HEPATIC FUNCTION PANEL
ALT: 33 U/L (ref 0–35)
AST: 21 U/L (ref 0–37)
Albumin: 4.2 g/dL (ref 3.5–5.2)
Alkaline Phosphatase: 60 U/L (ref 39–117)
Bilirubin, Direct: 0.1 mg/dL (ref 0.0–0.3)
Total Bilirubin: 0.7 mg/dL (ref 0.2–1.2)
Total Protein: 6.9 g/dL (ref 6.0–8.3)

## 2016-09-11 ENCOUNTER — Ambulatory Visit (INDEPENDENT_AMBULATORY_CARE_PROVIDER_SITE_OTHER): Payer: BLUE CROSS/BLUE SHIELD | Admitting: Internal Medicine

## 2016-09-11 ENCOUNTER — Encounter: Payer: Self-pay | Admitting: Internal Medicine

## 2016-09-11 VITALS — BP 118/72 | HR 71 | Temp 98.6°F | Resp 12 | Ht 63.0 in | Wt 168.8 lb

## 2016-09-11 DIAGNOSIS — Z1239 Encounter for other screening for malignant neoplasm of breast: Secondary | ICD-10-CM

## 2016-09-11 DIAGNOSIS — Z1231 Encounter for screening mammogram for malignant neoplasm of breast: Secondary | ICD-10-CM

## 2016-09-11 DIAGNOSIS — Z0000 Encounter for general adult medical examination without abnormal findings: Secondary | ICD-10-CM

## 2016-09-11 DIAGNOSIS — Z85038 Personal history of other malignant neoplasm of large intestine: Secondary | ICD-10-CM

## 2016-09-11 DIAGNOSIS — K7689 Other specified diseases of liver: Secondary | ICD-10-CM | POA: Diagnosis not present

## 2016-09-11 DIAGNOSIS — E7800 Pure hypercholesterolemia, unspecified: Secondary | ICD-10-CM

## 2016-09-11 DIAGNOSIS — R739 Hyperglycemia, unspecified: Secondary | ICD-10-CM

## 2016-09-11 DIAGNOSIS — R945 Abnormal results of liver function studies: Secondary | ICD-10-CM

## 2016-09-11 NOTE — Progress Notes (Signed)
[name]: Susan Weeks, female   DOB: [birth year], 66 y.o.   MRN: [id number]   Subjective:    [name]: Susan Weeks, female    DOB: Oct 31, 1950, 66 y.o.   MRN: [id number]  HPI  Patient here for a complete physical exam.  She reports she is doing relatively well.  Trying to stay active.  No chest pain.  No sob.  No acid reflux.  No abdominal pain.  Bowels moving.  Tolerating crestor.     Past Medical History:  Diagnosis Date  . Abnormal liver function   . Colon cancer ([place])    adenocarcinoma of the cecum s/p partial colon resection  . History of colon polyps   . Hyperlipidemia   . Hypertension    Past Surgical History:  Procedure Laterality Date  . ABDOMINAL HYSTERECTOMY  1984   secondary to bleeding and fibroid tumors  . APPENDECTOMY    . COLONOSCOPY WITH PROPOFOL N/A 07/28/2016   Procedure: COLONOSCOPY WITH PROPOFOL;  Surgeon: [name], MD;  Location: [facility] ENDOSCOPY;  Service: Endoscopy;  Laterality: N/A;   Family History  Problem Relation Age of Onset  . Stroke Mother   . Hypertension Mother   . Cancer Father        Prostate   . Heart disease Brother        myocardial infarction  . Hypertension Brother   . Hypertension Sister        x4  . Breast cancer Neg Hx    Social History   Social History  . Marital status: Married    Spouse name: [name]  . Number of children: 1  . Years of education: HS   Occupational History  .  Other    [employer]   Social History Main Topics  . Smoking status: Former Research scientist (life sciences)  . Smokeless tobacco: Never Used  . Alcohol use No  . Drug use: No  . Sexual activity: Not Asked   Other Topics Concern  . None   Social History Narrative  . None    Outpatient Encounter Prescriptions as of 09/11/2016  Medication Sig  . amLODipine (NORVASC) 5 MG tablet TAKE ONE TABLET BY MOUTH ONCE DAILY  . calcium carbonate (OS-CAL) 600 MG TABS Take 600 mg by mouth daily.   [name] lisinopril (PRINIVIL,ZESTRIL) 30 MG tablet TAKE ONE  TABLET BY MOUTH ONCE DAILY  . rosuvastatin (CRESTOR) 5 MG tablet Take one tablet by mouth q Monday, Wednesday and Friday.  . vitamin E 400 UNIT capsule Take 400 Units by mouth daily.   . [DISCONTINUED] ondansetron (ZOFRAN) 4 MG tablet Take 4 mg by mouth every 8 (eight) hours as needed for nausea or vomiting.   No facility-administered encounter medications on file as of 09/11/2016.     Review of Systems  Constitutional: Negative for appetite change and unexpected weight change.  HENT: Negative for congestion and sinus pressure.   Respiratory: Negative for cough, chest tightness and shortness of breath.   Cardiovascular: Negative for chest pain, palpitations and leg swelling.  Gastrointestinal: Negative for abdominal pain, diarrhea, nausea and vomiting.  Genitourinary: Negative for difficulty urinating and dysuria.  Musculoskeletal: Negative for back pain and joint swelling.  Skin: Negative for color change and rash.  Neurological: Negative for dizziness, light-headedness and headaches.  Psychiatric/Behavioral: Negative for agitation and dysphoric mood.       Objective:    Physical Exam  Constitutional: She appears well-developed and well-nourished. No distress.  HENT:  Nose: Nose normal.  Mouth/Throat: Oropharynx is clear and moist.  Neck: Neck supple. No thyromegaly present.  Cardiovascular: Normal rate and regular rhythm.   Pulmonary/Chest: Breath sounds normal. No respiratory distress. She has no wheezes.  Abdominal: Soft. Bowel sounds are normal. There is no tenderness.  Musculoskeletal: She exhibits no edema or tenderness.  Lymphadenopathy:    She has no cervical adenopathy.  Skin: No rash noted. No erythema.  Psychiatric: She has a normal mood and affect. Her behavior is normal.    BP 118/72 (BP Location: Left Arm, Patient Position: Sitting, Cuff Size: Large)   Pulse 71   Temp 98.6 F (37 C) (Oral)   Resp 12   Ht _0  (1.6 m)   Wt 168 lb 12.8 oz (76.6 kg)   LMP  06/28/1982   SpO2 98%   BMI 29.90 kg/m  Wt Readings from Last 3 Encounters:  09/11/16 168 lb 12.8 oz (76.6 kg)  07/28/16 168 lb (76.2 kg)  06/02/16 170 lb 3.2 oz (77.2 kg)     Lab Results  Component Value Date   WBC 4.1 02/26/2016   HGB 15.1 (H) 02/26/2016   HCT 44.8 02/26/2016   PLT 234.0 02/26/2016   GLUCOSE 109 (H) 06/09/2016   CHOL 230 (H) 06/09/2016   TRIG 98.0 06/09/2016   HDL 54.30 06/09/2016   LDLDIRECT 150.8 03/02/2013   LDLCALC 156 (H) 06/09/2016   ALT 33 09/05/2016   AST 21 09/05/2016   NA 140 06/09/2016   K 4.1 06/09/2016   CL 106 06/09/2016   CREATININE 0.80 06/09/2016   BUN 17 06/09/2016   CO2 26 06/09/2016   TSH 0.94 02/26/2016   HGBA1C 5.8 06/09/2016       Assessment & Plan:   Problem List Items Addressed This Visit    Abnormal liver function    Recent liver panel wnl.  Follow.        Relevant Orders   Hepatic function panel   Health care maintenance    Physical today 09/11/16.  s/p hysterectomy.  Mammogram as outlined in overview.  Recheck mammogram in one year.  colonoscoy 07/28/16.  Recommended f/u colonoscopy in 3 years.        History of colon cancer    Colonoscopy 07/28/16.  Recommended f/u colonoscopy in 3 years.        Hyperglycemia    Low carb diet and exercise.  Follow met b and a1c.        Relevant Orders   Hemoglobin A1c   Pure hypercholesterolemia    Low cholesterol diet and exercise.  Tolerating crestor.  Follow lipid panel an dliver function tests.       Relevant Orders   Lipid panel   Basic metabolic panel    Other Visit Diagnoses    Routine general medical examination at a health care facility    -  Primary   Screening for breast cancer       Relevant Orders   MM DIGITAL SCREENING BILATERAL       [name], MD

## 2016-09-11 NOTE — Assessment & Plan Note (Signed)
Physical today 09/11/16.  s/p hysterectomy.  Mammogram as outlined in overview.  Recheck mammogram in one year.  colonoscoy 07/28/16.  Recommended f/u colonoscopy in 3 years.

## 2016-09-11 NOTE — Progress Notes (Signed)
Pre-visit discussion using our clinic review tool. No additional management support is needed unless otherwise documented below in the visit note.  

## 2016-09-14 ENCOUNTER — Encounter: Payer: Self-pay | Admitting: Internal Medicine

## 2016-09-14 NOTE — Assessment & Plan Note (Signed)
Recent liver panel wnl.  Follow.

## 2016-09-14 NOTE — Assessment & Plan Note (Signed)
Low carb diet and exercise.  Follow met b and a1c.   

## 2016-09-14 NOTE — Assessment & Plan Note (Signed)
Low cholesterol diet and exercise.  Tolerating crestor.  Follow lipid panel an dliver function tests.

## 2016-09-14 NOTE — Assessment & Plan Note (Signed)
Colonoscopy 07/28/16.  Recommended f/u colonoscopy in 3 years.   

## 2016-09-19 ENCOUNTER — Other Ambulatory Visit: Payer: BLUE CROSS/BLUE SHIELD

## 2016-09-26 ENCOUNTER — Other Ambulatory Visit (INDEPENDENT_AMBULATORY_CARE_PROVIDER_SITE_OTHER): Payer: BLUE CROSS/BLUE SHIELD

## 2016-09-26 DIAGNOSIS — R739 Hyperglycemia, unspecified: Secondary | ICD-10-CM

## 2016-09-26 DIAGNOSIS — K7689 Other specified diseases of liver: Secondary | ICD-10-CM | POA: Diagnosis not present

## 2016-09-26 DIAGNOSIS — R945 Abnormal results of liver function studies: Secondary | ICD-10-CM

## 2016-09-26 DIAGNOSIS — E7800 Pure hypercholesterolemia, unspecified: Secondary | ICD-10-CM

## 2016-09-26 LAB — BASIC METABOLIC PANEL
BUN: 18 mg/dL (ref 6–23)
CO2: 30 mEq/L (ref 19–32)
Calcium: 9.3 mg/dL (ref 8.4–10.5)
Chloride: 103 mEq/L (ref 96–112)
Creatinine, Ser: 0.82 mg/dL (ref 0.40–1.20)
GFR: 89.46 mL/min (ref >60.00)
Glucose, Bld: 97 mg/dL (ref 70–99)
Potassium: 4.4 mEq/L (ref 3.5–5.1)
Sodium: 137 mEq/L (ref 135–145)

## 2016-09-26 LAB — HEPATIC FUNCTION PANEL
ALT: 41 U/L — ABNORMAL HIGH (ref 0–35)
AST: 28 U/L (ref 0–37)
Albumin: 4.5 g/dL (ref 3.5–5.2)
Alkaline Phosphatase: 55 U/L (ref 39–117)
Bilirubin, Direct: 0.2 mg/dL (ref 0.0–0.3)
Total Bilirubin: 0.8 mg/dL (ref 0.2–1.2)
Total Protein: 7.1 g/dL (ref 6.0–8.3)

## 2016-09-26 LAB — LIPID PANEL
Cholesterol: 166 mg/dL (ref 0–200)
HDL: 53.7 mg/dL (ref >39.00)
LDL Cholesterol: 96 mg/dL (ref 0–99)
NonHDL: 112.22
Total CHOL/HDL Ratio: 3
Triglycerides: 80 mg/dL (ref 0.0–149.0)
VLDL: 16 mg/dL (ref 0.0–40.0)

## 2016-09-26 LAB — HEMOGLOBIN A1C: Hgb A1c MFr Bld: 5.9 % (ref 4.6–6.5)

## 2016-10-01 ENCOUNTER — Telehealth: Payer: Self-pay | Admitting: Internal Medicine

## 2016-10-01 ENCOUNTER — Other Ambulatory Visit: Payer: Self-pay | Admitting: Internal Medicine

## 2016-10-01 DIAGNOSIS — R945 Abnormal results of liver function studies: Secondary | ICD-10-CM

## 2016-10-01 DIAGNOSIS — R7989 Other specified abnormal findings of blood chemistry: Secondary | ICD-10-CM

## 2016-10-01 NOTE — Progress Notes (Signed)
Order placed for f/u liver panel.  

## 2016-10-01 NOTE — Telephone Encounter (Signed)
Returned patient call and advised of lab results see labs for documentation .

## 2016-10-01 NOTE — Telephone Encounter (Signed)
Pt is returning phone call about lab results. Please call her at [phone].

## 2016-10-10 ENCOUNTER — Other Ambulatory Visit (INDEPENDENT_AMBULATORY_CARE_PROVIDER_SITE_OTHER): Payer: BLUE CROSS/BLUE SHIELD

## 2016-10-10 DIAGNOSIS — R945 Abnormal results of liver function studies: Secondary | ICD-10-CM | POA: Diagnosis not present

## 2016-10-10 DIAGNOSIS — R7989 Other specified abnormal findings of blood chemistry: Secondary | ICD-10-CM

## 2016-10-10 LAB — HEPATIC FUNCTION PANEL
ALT: 44 U/L — ABNORMAL HIGH (ref 0–35)
AST: 28 U/L (ref 0–37)
Albumin: 4.4 g/dL (ref 3.5–5.2)
Alkaline Phosphatase: 73 U/L (ref 39–117)
Bilirubin, Direct: 0.1 mg/dL (ref 0.0–0.3)
Total Bilirubin: 0.5 mg/dL (ref 0.2–1.2)
Total Protein: 7.1 g/dL (ref 6.0–8.3)

## 2016-10-11 ENCOUNTER — Other Ambulatory Visit: Payer: Self-pay | Admitting: Internal Medicine

## 2016-10-11 DIAGNOSIS — R7989 Other specified abnormal findings of blood chemistry: Secondary | ICD-10-CM

## 2016-10-11 DIAGNOSIS — R945 Abnormal results of liver function studies: Secondary | ICD-10-CM

## 2016-10-11 NOTE — Progress Notes (Signed)
Order placed for f/u liver panel.  

## 2016-10-17 DIAGNOSIS — H6123 Impacted cerumen, bilateral: Secondary | ICD-10-CM | POA: Diagnosis not present

## 2016-10-22 ENCOUNTER — Other Ambulatory Visit: Payer: Self-pay | Admitting: Internal Medicine

## 2016-10-29 ENCOUNTER — Other Ambulatory Visit: Payer: Self-pay | Admitting: Internal Medicine

## 2016-11-14 ENCOUNTER — Other Ambulatory Visit (INDEPENDENT_AMBULATORY_CARE_PROVIDER_SITE_OTHER): Payer: BLUE CROSS/BLUE SHIELD

## 2016-11-14 DIAGNOSIS — R7989 Other specified abnormal findings of blood chemistry: Secondary | ICD-10-CM

## 2016-11-14 DIAGNOSIS — R945 Abnormal results of liver function studies: Secondary | ICD-10-CM | POA: Diagnosis not present

## 2016-11-14 NOTE — Addendum Note (Signed)
Addended by: Arby Barrette on: 11/14/2016 08:00 AM   Modules accepted: Orders

## 2016-11-15 LAB — HEPATIC FUNCTION PANEL
ALT: 35 U/L — ABNORMAL HIGH (ref 6–29)
AST: 22 U/L (ref 10–35)
Albumin: 4.1 g/dL (ref 3.6–5.1)
Alkaline Phosphatase: 75 U/L (ref 33–130)
Bilirubin, Direct: 0.1 mg/dL (ref <=0.2)
Indirect Bilirubin: 0.5 mg/dL (ref 0.2–1.2)
Total Bilirubin: 0.6 mg/dL (ref 0.2–1.2)
Total Protein: 6.5 g/dL (ref 6.1–8.1)

## 2016-11-16 ENCOUNTER — Other Ambulatory Visit: Payer: Self-pay | Admitting: Internal Medicine

## 2016-11-16 DIAGNOSIS — E7800 Pure hypercholesterolemia, unspecified: Secondary | ICD-10-CM

## 2016-11-16 DIAGNOSIS — R945 Abnormal results of liver function studies: Secondary | ICD-10-CM

## 2016-11-16 DIAGNOSIS — R739 Hyperglycemia, unspecified: Secondary | ICD-10-CM

## 2016-11-16 DIAGNOSIS — I10 Essential (primary) hypertension: Secondary | ICD-10-CM

## 2016-11-16 NOTE — Progress Notes (Signed)
Order placed for f/u labs.  

## 2016-11-17 ENCOUNTER — Telehealth: Payer: Self-pay | Admitting: Internal Medicine

## 2016-11-17 NOTE — Telephone Encounter (Signed)
See lab results.  

## 2016-11-17 NOTE — Telephone Encounter (Signed)
Pt called back returning your call. Please advise, thank you!  Call pt @ [phone]

## 2016-11-17 NOTE — Progress Notes (Signed)
7/14 lmtrc

## 2016-12-04 ENCOUNTER — Other Ambulatory Visit: Payer: Self-pay | Admitting: Internal Medicine

## 2016-12-04 ENCOUNTER — Ambulatory Visit
Admission: RE | Admit: 2016-12-04 | Discharge: 2016-12-04 | Disposition: A | Discharge status: Home / Self Care | Payer: BLUE CROSS/BLUE SHIELD | Source: Ambulatory Visit | Attending: Internal Medicine | Admitting: Internal Medicine

## 2016-12-04 DIAGNOSIS — Z1231 Encounter for screening mammogram for malignant neoplasm of breast: Secondary | ICD-10-CM | POA: Insufficient documentation

## 2016-12-04 DIAGNOSIS — Z1239 Encounter for other screening for malignant neoplasm of breast: Secondary | ICD-10-CM

## 2017-01-01 ENCOUNTER — Other Ambulatory Visit: Payer: Self-pay | Admitting: Internal Medicine

## 2017-01-02 ENCOUNTER — Other Ambulatory Visit (INDEPENDENT_AMBULATORY_CARE_PROVIDER_SITE_OTHER): Payer: BLUE CROSS/BLUE SHIELD

## 2017-01-02 DIAGNOSIS — K7689 Other specified diseases of liver: Secondary | ICD-10-CM | POA: Diagnosis not present

## 2017-01-02 DIAGNOSIS — R739 Hyperglycemia, unspecified: Secondary | ICD-10-CM

## 2017-01-02 DIAGNOSIS — I10 Essential (primary) hypertension: Secondary | ICD-10-CM

## 2017-01-02 DIAGNOSIS — R945 Abnormal results of liver function studies: Secondary | ICD-10-CM

## 2017-01-02 DIAGNOSIS — E7800 Pure hypercholesterolemia, unspecified: Secondary | ICD-10-CM | POA: Diagnosis not present

## 2017-01-02 LAB — LIPID PANEL
Cholesterol: 175 mg/dL (ref 0–200)
HDL: 58 mg/dL (ref >39.00)
LDL Cholesterol: 100 mg/dL — ABNORMAL HIGH (ref 0–99)
NonHDL: 116.78
Total CHOL/HDL Ratio: 3
Triglycerides: 86 mg/dL (ref 0.0–149.0)
VLDL: 17.2 mg/dL (ref 0.0–40.0)

## 2017-01-02 LAB — BASIC METABOLIC PANEL
BUN: 14 mg/dL (ref 6–23)
CO2: 30 mEq/L (ref 19–32)
Calcium: 9.5 mg/dL (ref 8.4–10.5)
Chloride: 102 mEq/L (ref 96–112)
Creatinine, Ser: 0.78 mg/dL (ref 0.40–1.20)
GFR: 94.7 mL/min (ref >60.00)
Glucose, Bld: 98 mg/dL (ref 70–99)
Potassium: 4.5 mEq/L (ref 3.5–5.1)
Sodium: 137 mEq/L (ref 135–145)

## 2017-01-02 LAB — HEPATIC FUNCTION PANEL
ALT: 25 U/L (ref 0–35)
AST: 20 U/L (ref 0–37)
Albumin: 4.4 g/dL (ref 3.5–5.2)
Alkaline Phosphatase: 56 U/L (ref 39–117)
Bilirubin, Direct: 0.2 mg/dL (ref 0.0–0.3)
Total Bilirubin: 1 mg/dL (ref 0.2–1.2)
Total Protein: 7.1 g/dL (ref 6.0–8.3)

## 2017-01-02 LAB — HEMOGLOBIN A1C: Hgb A1c MFr Bld: 5.8 % (ref 4.6–6.5)

## 2017-01-02 NOTE — Addendum Note (Signed)
Addended by: Arby Barrette on: 01/02/2017 12:35 PM   Modules accepted: Orders

## 2017-01-06 ENCOUNTER — Other Ambulatory Visit: Payer: BLUE CROSS/BLUE SHIELD

## 2017-01-07 ENCOUNTER — Other Ambulatory Visit: Payer: BLUE CROSS/BLUE SHIELD

## 2017-01-09 ENCOUNTER — Ambulatory Visit (INDEPENDENT_AMBULATORY_CARE_PROVIDER_SITE_OTHER): Payer: BLUE CROSS/BLUE SHIELD | Admitting: Internal Medicine

## 2017-01-09 ENCOUNTER — Encounter: Payer: Self-pay | Admitting: Internal Medicine

## 2017-01-09 DIAGNOSIS — R739 Hyperglycemia, unspecified: Secondary | ICD-10-CM

## 2017-01-09 DIAGNOSIS — D72819 Decreased white blood cell count, unspecified: Secondary | ICD-10-CM | POA: Diagnosis not present

## 2017-01-09 DIAGNOSIS — K7689 Other specified diseases of liver: Secondary | ICD-10-CM

## 2017-01-09 DIAGNOSIS — I10 Essential (primary) hypertension: Secondary | ICD-10-CM | POA: Diagnosis not present

## 2017-01-09 DIAGNOSIS — E7800 Pure hypercholesterolemia, unspecified: Secondary | ICD-10-CM | POA: Diagnosis not present

## 2017-01-09 DIAGNOSIS — R945 Abnormal results of liver function studies: Secondary | ICD-10-CM

## 2017-01-09 NOTE — Progress Notes (Signed)
Pre-visit discussion using our clinic review tool. No additional management support is needed unless otherwise documented below in the visit note.  

## 2017-01-09 NOTE — Progress Notes (Signed)
[name]: Susan Weeks, female   DOB: 02/27/1951, 66 y.o.   MRN: [id number]   Subjective:    [name]: Susan Weeks, female    DOB: 09/25/1950, 66 y.o.   MRN: [id number]  HPI  Patient here for a scheduled follow up.  She reports she is doing well.  Feels good.  Eating well.  Trying to watch her diet.  No chest pain.  No sob.  No acid reflux.  No abdominal pain.  Bowels moving.  No urine change.     Past Medical History:  Diagnosis Date  . Abnormal liver function   . Colon cancer ([facility])    adenocarcinoma of the cecum s/p partial colon resection  . History of colon polyps   . Hyperlipidemia   . Hypertension    Past Surgical History:  Procedure Laterality Date  . ABDOMINAL HYSTERECTOMY  1984   secondary to bleeding and fibroid tumors  . APPENDECTOMY    . COLONOSCOPY WITH PROPOFOL N/A 07/28/2016   Procedure: COLONOSCOPY WITH PROPOFOL;  Surgeon: Martin U Skulskie, MD;  Location: [facility] ENDOSCOPY;  Service: Endoscopy;  Laterality: N/A;   Family History  Problem Relation Age of Onset  . Stroke Mother   . Hypertension Mother   . Cancer Father        Prostate   . Heart disease Brother        myocardial infarction  . Hypertension Brother   . Hypertension Sister        x4  . Breast cancer Neg Hx    Social History   Social History  . Marital status: Married    Spouse name: Eddie  . Number of children: 1  . Years of education: HS   Occupational History  .  Other    Medi [employer]   Social History Main Topics  . Smoking status: Former Smoker  . Smokeless tobacco: Never Used  . Alcohol use No  . Drug use: No  . Sexual activity: Not Asked   Other Topics Concern  . None   Social History Narrative  . None    Outpatient Encounter Prescriptions as of 01/09/2017  Medication Sig  . amLODipine (NORVASC) 5 MG tablet TAKE 1 TABLET BY MOUTH ONCE DAILY  . calcium carbonate (OS-CAL) 600 MG TABS Take 600 mg by mouth daily.   . lisinopril (PRINIVIL,ZESTRIL) 30 MG tablet  TAKE ONE TABLET BY MOUTH ONCE DAILY  . rosuvastatin (CRESTOR) 5 MG tablet TAKE 1 TABLET BY MOUTH ONCE DAILY ON  MONDAY,  WEDNESDAY,  AND  FRIDAY  . vitamin E 400 UNIT capsule Take 400 Units by mouth daily.   . [DISCONTINUED] rosuvastatin (CRESTOR) 5 MG tablet TAKE 1 TABLET BY MOUTH ONCE DAILY ON  MONDAY,  WEDNESDAY,  AND  FRIDAY   No facility-administered encounter medications on file as of 01/09/2017.     Review of Systems  Constitutional: Negative for appetite change and unexpected weight change.  HENT: Negative for congestion and sinus pressure.   Respiratory: Negative for cough, chest tightness and shortness of breath.   Cardiovascular: Negative for chest pain, palpitations and leg swelling.  Gastrointestinal: Negative for abdominal pain, diarrhea, nausea and vomiting.  Genitourinary: Negative for difficulty urinating and dysuria.  Musculoskeletal: Negative for back pain and joint swelling.  Skin: Negative for color change and rash.  Neurological: Negative for dizziness, light-headedness and headaches.  Psychiatric/Behavioral: Negative for agitation and dysphoric mood.       Objective:     Blood pressure rechecked   by me:  128/84  Physical Exam  Constitutional: She appears well-developed and well-nourished. No distress.  HENT:  Nose: Nose normal.  Mouth/Throat: Oropharynx is clear and moist.  Neck: Neck supple. No thyromegaly present.  Cardiovascular: Normal rate and regular rhythm.   Pulmonary/Chest: Breath sounds normal. No respiratory distress. She has no wheezes.  Abdominal: Soft. Bowel sounds are normal. There is no tenderness.  Musculoskeletal: She exhibits no edema or tenderness.  Lymphadenopathy:    She has no cervical adenopathy.  Skin: No rash noted. No erythema.  Psychiatric: She has a normal mood and affect. Her behavior is normal.    BP 118/74 (BP Location: Left Arm, Patient Position: Sitting, Cuff Size: Normal)   Pulse 78   Temp 98.6 F (37 C) (Oral)    Resp 14   Ht 5' 3" (1.6 m)   Wt 171 lb 12.8 oz (77.9 kg)   LMP 06/28/1982   SpO2 96%   BMI 30.43 kg/m  Wt Readings from Last 3 Encounters:  01/09/17 171 lb 12.8 oz (77.9 kg)  09/11/16 168 lb 12.8 oz (76.6 kg)  07/28/16 168 lb (76.2 kg)     Lab Results  Component Value Date   WBC 4.1 02/26/2016   HGB 15.1 (H) 02/26/2016   HCT 44.8 02/26/2016   PLT 234.0 02/26/2016   GLUCOSE 98 01/02/2017   CHOL 175 01/02/2017   TRIG 86.0 01/02/2017   HDL 58.00 01/02/2017   LDLDIRECT 150.8 03/02/2013   LDLCALC 100 (H) 01/02/2017   ALT 25 01/02/2017   AST 20 01/02/2017   NA 137 01/02/2017   K 4.5 01/02/2017   CL 102 01/02/2017   CREATININE 0.78 01/02/2017   BUN 14 01/02/2017   CO2 30 01/02/2017   TSH 0.94 02/26/2016   HGBA1C 5.8 01/02/2017    Mm Screening Breast Tomo Bilateral  Result Date: 12/04/2016 CLINICAL DATA:  Screening. EXAM: 2D DIGITAL SCREENING BILATERAL MAMMOGRAM WITH CAD AND ADJUNCT TOMO COMPARISON:  Previous exam(s). ACR Breast Density Category b: There are scattered areas of fibroglandular density. FINDINGS: There are no findings suspicious for malignancy. Images were processed with CAD. IMPRESSION: No mammographic evidence of malignancy. A result letter of this screening mammogram will be mailed directly to the patient. RECOMMENDATION: Screening mammogram in one year. (Code:SM-B-01Y) BI-RADS CATEGORY  1: Negative. Electronically Signed   By: Ammie Ferrier M.D.   On: 12/04/2016 10:33       Assessment & Plan:   Problem List Items Addressed This Visit    Abnormal liver function    Abdominal ultrasound 08/05/13 - negative.  Follow liver panel.        Relevant Orders   Hepatic function panel   Essential hypertension, benign    Blood pressure under good control.  Continue same medication regimen.  Follow pressures.  Follow metabolic panel.        Relevant Orders   CBC with Differential/Platelet   TSH   Basic metabolic panel   Hyperglycemia    Low carb diet and  exercise.  Follow met b and a1c.        Relevant Orders   Hemoglobin A1c   Leukopenia    Follow cbc.        Pure hypercholesterolemia    On crestor.  Low cholesterol diet and exercise.  Follow lipid panel and liver function tests.        Relevant Orders   Lipid panel       [name], MD

## 2017-01-11 ENCOUNTER — Encounter: Payer: Self-pay | Admitting: Internal Medicine

## 2017-01-11 NOTE — Assessment & Plan Note (Signed)
On crestor.  Low cholesterol diet and exercise.  Follow lipid panel and liver function tests.   

## 2017-01-11 NOTE — Assessment & Plan Note (Signed)
Blood pressure under good control.  Continue same medication regimen.  Follow pressures.  Follow metabolic panel.   

## 2017-01-11 NOTE — Assessment & Plan Note (Signed)
Follow cbc.  

## 2017-01-11 NOTE — Assessment & Plan Note (Signed)
Low carb diet and exercise.  Follow met b and a1c.   

## 2017-01-11 NOTE — Assessment & Plan Note (Signed)
Abdominal ultrasound 08/05/13 - negative.  Follow liver panel.

## 2017-03-09 ENCOUNTER — Other Ambulatory Visit: Payer: Self-pay | Admitting: Internal Medicine

## 2017-05-07 ENCOUNTER — Other Ambulatory Visit: Payer: Self-pay | Admitting: *Deleted

## 2017-05-07 ENCOUNTER — Telehealth: Payer: Self-pay | Admitting: Internal Medicine

## 2017-05-07 MED ORDER — ROSUVASTATIN CALCIUM 5 MG PO TABS: ORAL_TABLET | ORAL | 2 refills | Status: DC

## 2017-05-07 NOTE — Telephone Encounter (Signed)
Copied from [place] [phone]. Topic: Quick Communication - See Telephone Encounter >> May 07, 2017 12:12 PM Vernona Rieger wrote: CRM for notification. See Telephone encounter for:   05/07/17. Refill rosuvastatin (CRESTOR) 5 MG tablet [place], [place]

## 2017-05-08 DIAGNOSIS — H6123 Impacted cerumen, bilateral: Secondary | ICD-10-CM | POA: Diagnosis not present

## 2017-05-13 ENCOUNTER — Encounter: Payer: Self-pay | Admitting: Internal Medicine

## 2017-05-13 ENCOUNTER — Other Ambulatory Visit (INDEPENDENT_AMBULATORY_CARE_PROVIDER_SITE_OTHER): Payer: BLUE CROSS/BLUE SHIELD

## 2017-05-13 DIAGNOSIS — R739 Hyperglycemia, unspecified: Secondary | ICD-10-CM

## 2017-05-13 DIAGNOSIS — K7689 Other specified diseases of liver: Secondary | ICD-10-CM | POA: Diagnosis not present

## 2017-05-13 DIAGNOSIS — R945 Abnormal results of liver function studies: Secondary | ICD-10-CM

## 2017-05-13 DIAGNOSIS — E7800 Pure hypercholesterolemia, unspecified: Secondary | ICD-10-CM | POA: Diagnosis not present

## 2017-05-13 DIAGNOSIS — I10 Essential (primary) hypertension: Secondary | ICD-10-CM

## 2017-05-13 LAB — HEPATIC FUNCTION PANEL
ALT: 30 U/L (ref 0–35)
AST: 19 U/L (ref 0–37)
Albumin: 4.5 g/dL (ref 3.5–5.2)
Alkaline Phosphatase: 57 U/L (ref 39–117)
Bilirubin, Direct: 0.2 mg/dL (ref 0.0–0.3)
Total Bilirubin: 1 mg/dL (ref 0.2–1.2)
Total Protein: 7.1 g/dL (ref 6.0–8.3)

## 2017-05-13 LAB — BASIC METABOLIC PANEL
BUN: 18 mg/dL (ref 6–23)
CO2: 30 mEq/L (ref 19–32)
Calcium: 9.4 mg/dL (ref 8.4–10.5)
Chloride: 101 mEq/L (ref 96–112)
Creatinine, Ser: 0.84 mg/dL (ref 0.40–1.20)
GFR: 86.84 mL/min (ref >60.00)
Glucose, Bld: 104 mg/dL — ABNORMAL HIGH (ref 70–99)
Potassium: 4.8 mEq/L (ref 3.5–5.1)
Sodium: 137 mEq/L (ref 135–145)

## 2017-05-13 LAB — CBC WITH DIFFERENTIAL/PLATELET
Basophils Absolute: 0 10*3/uL (ref 0.0–0.1)
Basophils Relative: 1 % (ref 0.0–3.0)
Eosinophils Absolute: 0.1 10*3/uL (ref 0.0–0.7)
Eosinophils Relative: 2.2 % (ref 0.0–5.0)
HCT: 48.2 % — ABNORMAL HIGH (ref 36.0–46.0)
Hemoglobin: 16.1 g/dL — ABNORMAL HIGH (ref 12.0–15.0)
Lymphocytes Relative: 41.3 % (ref 12.0–46.0)
Lymphs Abs: 1.5 10*3/uL (ref 0.7–4.0)
MCHC: 33.3 g/dL (ref 30.0–36.0)
MCV: 87.8 fl (ref 78.0–100.0)
Monocytes Absolute: 0.3 10*3/uL (ref 0.1–1.0)
Monocytes Relative: 7.2 % (ref 3.0–12.0)
Neutro Abs: 1.8 10*3/uL (ref 1.4–7.7)
Neutrophils Relative %: 48.3 % (ref 43.0–77.0)
Platelets: 211 10*3/uL (ref 150.0–400.0)
RBC: 5.49 Mil/uL — ABNORMAL HIGH (ref 3.87–5.11)
RDW: 13.2 % (ref 11.5–15.5)
WBC: 3.6 10*3/uL — ABNORMAL LOW (ref 4.0–10.5)

## 2017-05-13 LAB — LIPID PANEL
Cholesterol: 186 mg/dL (ref 0–200)
HDL: 59.5 mg/dL (ref >39.00)
LDL Cholesterol: 112 mg/dL — ABNORMAL HIGH (ref 0–99)
NonHDL: 126.21
Total CHOL/HDL Ratio: 3
Triglycerides: 72 mg/dL (ref 0.0–149.0)
VLDL: 14.4 mg/dL (ref 0.0–40.0)

## 2017-05-13 LAB — HEMOGLOBIN A1C: Hgb A1c MFr Bld: 6 % (ref 4.6–6.5)

## 2017-05-13 LAB — TSH: TSH: 1.02 u[IU]/mL (ref 0.35–4.50)

## 2017-05-15 ENCOUNTER — Ambulatory Visit (INDEPENDENT_AMBULATORY_CARE_PROVIDER_SITE_OTHER): Payer: BLUE CROSS/BLUE SHIELD | Admitting: Internal Medicine

## 2017-05-15 ENCOUNTER — Encounter: Payer: Self-pay | Admitting: Internal Medicine

## 2017-05-15 DIAGNOSIS — D72819 Decreased white blood cell count, unspecified: Secondary | ICD-10-CM | POA: Diagnosis not present

## 2017-05-15 DIAGNOSIS — R739 Hyperglycemia, unspecified: Secondary | ICD-10-CM | POA: Diagnosis not present

## 2017-05-15 DIAGNOSIS — I10 Essential (primary) hypertension: Secondary | ICD-10-CM | POA: Diagnosis not present

## 2017-05-15 DIAGNOSIS — K7689 Other specified diseases of liver: Secondary | ICD-10-CM

## 2017-05-15 DIAGNOSIS — E7800 Pure hypercholesterolemia, unspecified: Secondary | ICD-10-CM

## 2017-05-15 DIAGNOSIS — Z85038 Personal history of other malignant neoplasm of large intestine: Secondary | ICD-10-CM

## 2017-05-15 DIAGNOSIS — R945 Abnormal results of liver function studies: Secondary | ICD-10-CM

## 2017-05-15 MED ORDER — ROSUVASTATIN CALCIUM 5 MG PO TABS: ORAL_TABLET | ORAL | 3 refills | Status: DC

## 2017-05-15 NOTE — Progress Notes (Signed)
Pre visit review using our clinic review tool, if applicable. No additional management support is needed unless otherwise documented below in the visit note. 

## 2017-05-15 NOTE — Progress Notes (Signed)
[name]: Susan Weeks, female   DOB: [birth year], 67 y.o.   MRN: [id number]   Subjective:    [name]: Susan Weeks, female    DOB: May 01, 1951, 67 y.o.   MRN: [id number]  HPI  Patient here for a scheduled follow up.  She reports she is doing relatively well.  Tries to stay active.  No chest pain.  No sob. No acid reflux.  No abdominal pain.  Bowels moving.  Discussed recent labs.  Discussed diet and exercise.     Past Medical History:  Diagnosis Date  . Abnormal liver function   . Colon cancer ([place])    adenocarcinoma of the cecum s/p partial colon resection  . History of colon polyps   . Hyperlipidemia   . Hypertension    Past Surgical History:  Procedure Laterality Date  . ABDOMINAL HYSTERECTOMY  1984   secondary to bleeding and fibroid tumors  . APPENDECTOMY    . COLONOSCOPY WITH PROPOFOL N/A 07/28/2016   Procedure: COLONOSCOPY WITH PROPOFOL;  Surgeon: [name], MD;  [facility]: [facility] ENDOSCOPY;  Service: Endoscopy;  Laterality: N/A;   Family History  Problem Relation Age of Onset  . Stroke Mother   . Hypertension Mother   . Cancer Father        Prostate   . Heart disease Brother        myocardial infarction  . Hypertension Brother   . Hypertension Sister        x4  . Breast cancer Neg Hx    Social History   Socioeconomic History  . Marital status: Married    Spouse name: [name]  . Number of children: 1  . Years of education: HS  . Highest education level: None  Social Needs  . Financial resource strain: None  . Food insecurity - worry: None  . Food insecurity - inability: None  . Transportation needs - medical: None  . Transportation needs - non-medical: None  Occupational History    Employer: OTHER    Comment: [employer]  Tobacco Use  . Smoking status: Former Research scientist (life sciences)  . Smokeless tobacco: Never Used  Substance and Sexual Activity  . Alcohol use: No    Alcohol/week: 0.0 oz  . Drug use: No  . Sexual activity: None  Other Topics  Concern  . None  Social History Narrative  . None    Outpatient Encounter Medications as of 05/15/2017  Medication Sig  . amLODipine (NORVASC) 5 MG tablet TAKE 1 TABLET BY MOUTH ONCE DAILY  . calcium carbonate (OS-CAL) 600 MG TABS Take 600 mg by mouth daily.   [name] lisinopril (PRINIVIL,ZESTRIL) 30 MG tablet TAKE ONE TABLET BY MOUTH ONCE DAILY  . rosuvastatin (CRESTOR) 5 MG tablet TAKE ONE TABLET BY MOUTH daily  . vitamin E 400 UNIT capsule Take 400 Units by mouth daily.   . [DISCONTINUED] rosuvastatin (CRESTOR) 5 MG tablet TAKE ONE TABLET BY MOUTH ONCE DAILY ON MONDAY, WEDNESDAY, AND FRIDAY   No facility-administered encounter medications on file as of 05/15/2017.     Review of Systems  Constitutional: Negative for appetite change and unexpected weight change.  HENT: Negative for congestion and sinus pressure.   Respiratory: Negative for cough, chest tightness and shortness of breath.   Cardiovascular: Negative for chest pain, palpitations and leg swelling.  Gastrointestinal: Negative for abdominal pain, diarrhea, nausea and vomiting.  Genitourinary: Negative for difficulty urinating and dysuria.  Musculoskeletal: Negative for joint swelling and myalgias.  Skin: Negative for color change  and rash.  Neurological: Negative for dizziness, light-headedness and headaches.  Psychiatric/Behavioral: Negative for agitation and dysphoric mood.       Objective:    Physical Exam  Constitutional: She appears well-developed and well-nourished. No distress.  HENT:  Nose: Nose normal.  Mouth/Throat: Oropharynx is clear and moist.  Neck: Neck supple. No thyromegaly present.  Cardiovascular: Normal rate and regular rhythm.  Pulmonary/Chest: Breath sounds normal. No respiratory distress. She has no wheezes.  Abdominal: Soft. Bowel sounds are normal. There is no tenderness.  Musculoskeletal: She exhibits no edema or tenderness.  Lymphadenopathy:    She has no cervical adenopathy.  Skin: No rash  noted. No erythema.  Psychiatric: She has a normal mood and affect. Her behavior is normal.    BP 118/80   Pulse 78   Temp 98.6 F (37 C) (Oral)   Ht 5' 3" (1.6 m)   Wt 172 lb (78 kg)   LMP 06/28/1982   SpO2 98%   BMI 30.47 kg/m  Wt Readings from Last 3 Encounters:  05/15/17 172 lb (78 kg)  01/09/17 171 lb 12.8 oz (77.9 kg)  09/11/16 168 lb 12.8 oz (76.6 kg)     Lab Results  Component Value Date   WBC 3.6 (L) 05/13/2017   HGB 16.1 (H) 05/13/2017   HCT 48.2 (H) 05/13/2017   PLT 211.0 05/13/2017   GLUCOSE 104 (H) 05/13/2017   CHOL 186 05/13/2017   TRIG 72.0 05/13/2017   HDL 59.50 05/13/2017   LDLDIRECT 150.8 03/02/2013   LDLCALC 112 (H) 05/13/2017   ALT 30 05/13/2017   AST 19 05/13/2017   NA 137 05/13/2017   K 4.8 05/13/2017   CL 101 05/13/2017   CREATININE 0.84 05/13/2017   BUN 18 05/13/2017   CO2 30 05/13/2017   TSH 1.02 05/13/2017   HGBA1C 6.0 05/13/2017    Mm Screening Breast Tomo Bilateral  Result Date: 12/04/2016 CLINICAL DATA:  Screening. EXAM: 2D DIGITAL SCREENING BILATERAL MAMMOGRAM WITH CAD AND ADJUNCT TOMO COMPARISON:  Previous exam(s). ACR Breast Density Category b: There are scattered areas of fibroglandular density. FINDINGS: There are no findings suspicious for malignancy. Images were processed with CAD. IMPRESSION: No mammographic evidence of malignancy. A result letter of this screening mammogram will be mailed directly to the patient. RECOMMENDATION: Screening mammogram in one year. (Code:SM-B-01Y) BI-RADS CATEGORY  1: Negative. Electronically Signed   By: Ammie Ferrier M.D.   On: 12/04/2016 10:33       Assessment & Plan:   Problem List Items Addressed This Visit    Abnormal liver function    Previous abdominal ultrasound negative.  Most recent liver panel wnl.        Essential hypertension, benign    Blood pressure under good control.  Continue same medication regimen.  Follow pressures.  Follow metabolic panel.        Relevant  Medications   rosuvastatin (CRESTOR) 5 MG tablet   History of colon cancer    Colonoscopy 07/28/16.  Recommended f/u colonoscopy in 3 years.        Hyperglycemia    Low carb diet and exercise.  Follow met b and a1c.        Leukopenia    Recent white blood cell count slightly decreased.  Follow cbc.       Relevant Orders   CBC with Differential/Platelet   Pure hypercholesterolemia    On crestor.  Low cholesterol diet and exercise.  Will increase crestor to daily dosing.  Follow lipid  panel and liver function tests.       Relevant Medications   rosuvastatin (CRESTOR) 5 MG tablet       [name], MD

## 2017-05-17 ENCOUNTER — Encounter: Payer: Self-pay | Admitting: Internal Medicine

## 2017-05-17 NOTE — Assessment & Plan Note (Signed)
Blood pressure under good control.  Continue same medication regimen.  Follow pressures.  Follow metabolic panel.   

## 2017-05-17 NOTE — Assessment & Plan Note (Signed)
Colonoscopy 07/28/16.  Recommended f/u colonoscopy in 3 years.

## 2017-05-17 NOTE — Assessment & Plan Note (Signed)
Low carb diet and exercise.  Follow met b and a1c.   

## 2017-05-17 NOTE — Assessment & Plan Note (Signed)
Recent white blood cell count slightly decreased.  Follow cbc.   

## 2017-05-17 NOTE — Assessment & Plan Note (Signed)
Previous abdominal ultrasound negative.  Most recent liver panel wnl.

## 2017-05-17 NOTE — Assessment & Plan Note (Signed)
On crestor.  Low cholesterol diet and exercise.  Will increase crestor to daily dosing.  Follow lipid panel and liver function tests.

## 2017-06-05 ENCOUNTER — Other Ambulatory Visit (INDEPENDENT_AMBULATORY_CARE_PROVIDER_SITE_OTHER): Payer: BLUE CROSS/BLUE SHIELD

## 2017-06-05 DIAGNOSIS — D72819 Decreased white blood cell count, unspecified: Secondary | ICD-10-CM

## 2017-06-05 LAB — CBC WITH DIFFERENTIAL/PLATELET
Basophils Absolute: 0 10*3/uL (ref 0.0–0.1)
Basophils Relative: 0.6 % (ref 0.0–3.0)
Eosinophils Absolute: 0.1 10*3/uL (ref 0.0–0.7)
Eosinophils Relative: 1.8 % (ref 0.0–5.0)
HCT: 44.6 % (ref 36.0–46.0)
Hemoglobin: 15.1 g/dL — ABNORMAL HIGH (ref 12.0–15.0)
Lymphocytes Relative: 29.4 % (ref 12.0–46.0)
Lymphs Abs: 1.1 10*3/uL (ref 0.7–4.0)
MCHC: 34 g/dL (ref 30.0–36.0)
MCV: 86.9 fl (ref 78.0–100.0)
Monocytes Absolute: 0.4 10*3/uL (ref 0.1–1.0)
Monocytes Relative: 10 % (ref 3.0–12.0)
Neutro Abs: 2.2 10*3/uL (ref 1.4–7.7)
Neutrophils Relative %: 58.2 % (ref 43.0–77.0)
Platelets: 218 10*3/uL (ref 150.0–400.0)
RBC: 5.13 Mil/uL — ABNORMAL HIGH (ref 3.87–5.11)
RDW: 13.1 % (ref 11.5–15.5)
WBC: 3.8 10*3/uL — ABNORMAL LOW (ref 4.0–10.5)

## 2017-06-09 ENCOUNTER — Telehealth: Payer: Self-pay | Admitting: Internal Medicine

## 2017-06-09 NOTE — Telephone Encounter (Signed)
Copied from [place]. Topic: Quick Communication - Rx Refill/Question >> Jun 09, 2017  4:26 PM Cecelia Byars, NT wrote: Medication: rosuvastatin 5 mg  Has the patient contacted their pharmacy? yes  (Agent: If no, request that the patient contact the pharmacy for the refill.) Preferred Pharmacy (with phone number or street name [facility] [phone] Agent: Please be advised that RX refills may take up to 3 business days. We ask that you follow-up with your pharmacy. Patient said the pharmacy refuses to refill until 06/30/17 due to insurance not covering , please advise

## 2017-06-09 NOTE — Telephone Encounter (Signed)
Please advise 

## 2017-06-10 NOTE — Telephone Encounter (Signed)
Left detailed message for patient.

## 2017-06-13 ENCOUNTER — Other Ambulatory Visit: Payer: Self-pay | Admitting: Internal Medicine

## 2017-07-07 ENCOUNTER — Other Ambulatory Visit: Payer: Self-pay | Admitting: Internal Medicine

## 2017-09-11 ENCOUNTER — Other Ambulatory Visit: Payer: Self-pay | Admitting: Internal Medicine

## 2017-09-17 ENCOUNTER — Encounter: Payer: Self-pay | Admitting: Internal Medicine

## 2017-09-17 ENCOUNTER — Ambulatory Visit (INDEPENDENT_AMBULATORY_CARE_PROVIDER_SITE_OTHER): Payer: BLUE CROSS/BLUE SHIELD | Admitting: Internal Medicine

## 2017-09-17 VITALS — BP 124/78 | HR 78 | Temp 98.4°F | Resp 16 | Ht 63.0 in | Wt 155.2 lb

## 2017-09-17 DIAGNOSIS — D72819 Decreased white blood cell count, unspecified: Secondary | ICD-10-CM

## 2017-09-17 DIAGNOSIS — E7800 Pure hypercholesterolemia, unspecified: Secondary | ICD-10-CM

## 2017-09-17 DIAGNOSIS — Z1231 Encounter for screening mammogram for malignant neoplasm of breast: Secondary | ICD-10-CM

## 2017-09-17 DIAGNOSIS — R739 Hyperglycemia, unspecified: Secondary | ICD-10-CM | POA: Diagnosis not present

## 2017-09-17 DIAGNOSIS — Z0000 Encounter for general adult medical examination without abnormal findings: Secondary | ICD-10-CM

## 2017-09-17 DIAGNOSIS — R945 Abnormal results of liver function studies: Secondary | ICD-10-CM

## 2017-09-17 DIAGNOSIS — Z85038 Personal history of other malignant neoplasm of large intestine: Secondary | ICD-10-CM | POA: Diagnosis not present

## 2017-09-17 DIAGNOSIS — Z1239 Encounter for other screening for malignant neoplasm of breast: Secondary | ICD-10-CM

## 2017-09-17 DIAGNOSIS — H25813 Combined forms of age-related cataract, bilateral: Secondary | ICD-10-CM | POA: Diagnosis not present

## 2017-09-17 DIAGNOSIS — I10 Essential (primary) hypertension: Secondary | ICD-10-CM

## 2017-09-17 DIAGNOSIS — Z23 Encounter for immunization: Secondary | ICD-10-CM | POA: Diagnosis not present

## 2017-09-17 LAB — BASIC METABOLIC PANEL
BUN: 20 mg/dL (ref 6–23)
CO2: 31 mEq/L (ref 19–32)
Calcium: 9.4 mg/dL (ref 8.4–10.5)
Chloride: 103 mEq/L (ref 96–112)
Creatinine, Ser: 0.83 mg/dL (ref 0.40–1.20)
GFR: 87.96 mL/min (ref >60.00)
Glucose, Bld: 110 mg/dL — ABNORMAL HIGH (ref 70–99)
Potassium: 4.6 mEq/L (ref 3.5–5.1)
Sodium: 139 mEq/L (ref 135–145)

## 2017-09-17 LAB — HEPATIC FUNCTION PANEL
ALT: 28 U/L (ref 0–35)
AST: 21 U/L (ref 0–37)
Albumin: 4.2 g/dL (ref 3.5–5.2)
Alkaline Phosphatase: 52 U/L (ref 39–117)
Bilirubin, Direct: 0.1 mg/dL (ref 0.0–0.3)
Total Bilirubin: 0.7 mg/dL (ref 0.2–1.2)
Total Protein: 7.2 g/dL (ref 6.0–8.3)

## 2017-09-17 LAB — CBC WITH DIFFERENTIAL/PLATELET
Basophils Absolute: 0 10*3/uL (ref 0.0–0.1)
Basophils Relative: 0.9 % (ref 0.0–3.0)
Eosinophils Absolute: 0.1 10*3/uL (ref 0.0–0.7)
Eosinophils Relative: 1.9 % (ref 0.0–5.0)
HCT: 47.7 % — ABNORMAL HIGH (ref 36.0–46.0)
Hemoglobin: 16.2 g/dL — ABNORMAL HIGH (ref 12.0–15.0)
Lymphocytes Relative: 30.6 % (ref 12.0–46.0)
Lymphs Abs: 1.1 10*3/uL (ref 0.7–4.0)
MCHC: 33.9 g/dL (ref 30.0–36.0)
MCV: 87 fl (ref 78.0–100.0)
Monocytes Absolute: 0.2 10*3/uL (ref 0.1–1.0)
Monocytes Relative: 5.9 % (ref 3.0–12.0)
Neutro Abs: 2.1 10*3/uL (ref 1.4–7.7)
Neutrophils Relative %: 60.7 % (ref 43.0–77.0)
Platelets: 222 10*3/uL (ref 150.0–400.0)
RBC: 5.49 Mil/uL — ABNORMAL HIGH (ref 3.87–5.11)
RDW: 13.3 % (ref 11.5–15.5)
WBC: 3.5 10*3/uL — ABNORMAL LOW (ref 4.0–10.5)

## 2017-09-17 LAB — LIPID PANEL
Cholesterol: 147 mg/dL (ref 0–200)
HDL: 60.3 mg/dL (ref >39.00)
LDL Cholesterol: 77 mg/dL (ref 0–99)
NonHDL: 86.58
Total CHOL/HDL Ratio: 2
Triglycerides: 50 mg/dL (ref 0.0–149.0)
VLDL: 10 mg/dL (ref 0.0–40.0)

## 2017-09-17 LAB — HEMOGLOBIN A1C: Hgb A1c MFr Bld: 5.7 % (ref 4.6–6.5)

## 2017-09-17 MED ORDER — LISINOPRIL 30 MG PO TABS: 30.0000 mg | ORAL_TABLET | Freq: Every day | ORAL | 1 refills | Status: DC

## 2017-09-17 MED ORDER — ROSUVASTATIN CALCIUM 5 MG PO TABS: ORAL_TABLET | ORAL | 1 refills | Status: DC

## 2017-09-17 NOTE — Assessment & Plan Note (Signed)
Physical today 09/17/17.  Mammogram 12/04/16 - Birads I.  Colonoscopy 07/2016.  Recommended f/u in 3 years.

## 2017-09-17 NOTE — Progress Notes (Signed)
[name]: [name], female   DOB: Apr 04, 1951, 67 y.o.   MRN: [id number]   Subjective:    [name]: Susan Weeks, female    DOB: 07/19/1950, 67 y.o.   MRN: [id number]  HPI  Patient here for her physical exam.  She feels good.  She has adjusted diet.  Lost weight.  Feels better.  No chest pain.  No sob. No acid reflux.  No abdominal pain.  Bowels moving.  No urine change.     Past Medical History:  Diagnosis Date  . Abnormal liver function   . Colon cancer ([place])    adenocarcinoma of the cecum s/p partial colon resection  . History of colon polyps   . Hyperlipidemia   . Hypertension    Past Surgical History:  Procedure Laterality Date  . ABDOMINAL HYSTERECTOMY  1984   secondary to bleeding and fibroid tumors  . APPENDECTOMY    . COLONOSCOPY WITH PROPOFOL N/A 07/28/2016   Procedure: COLONOSCOPY WITH PROPOFOL;  Surgeon: [name], MD;  Location: [facility] ENDOSCOPY;  Service: Endoscopy;  Laterality: N/A;   Family History  Problem Relation Age of Onset  . Stroke Mother   . Hypertension Mother   . Cancer Father        Prostate   . Heart disease Brother        myocardial infarction  . Hypertension Brother   . Hypertension Sister        x4  . Breast cancer Neg Hx    Social History   Socioeconomic History  . Marital status: Married    Spouse name: [name]  . Number of children: 1  . Years of education: HS  . Highest education level: Not on file  Occupational History    Employer: OTHER    Comment: [employer]  Social Needs  . Financial resource strain: Not on file  . Food insecurity:    Worry: Not on file    Inability: Not on file  . Transportation needs:    Medical: Not on file    Non-medical: Not on file  Tobacco Use  . Smoking status: Former Research scientist (life sciences)  . Smokeless tobacco: Never Used  Substance and Sexual Activity  . Alcohol use: No    Alcohol/week: 0.0 oz  . Drug use: No  . Sexual activity: Not on file  Lifestyle  . Physical activity:   Days per week: Not on file    Minutes per session: Not on file  . Stress: Not on file  Relationships  . Social connections:    Talks on phone: Not on file    Gets together: Not on file    Attends religious service: Not on file    Active member of club or organization: Not on file    Attends meetings of clubs or organizations: Not on file    Relationship status: Not on file  Other Topics Concern  . Not on file  Social History Narrative  . Not on file    Outpatient Encounter Medications as of 09/17/2017  Medication Sig  . amLODipine (NORVASC) 5 MG tablet TAKE 1 TABLET BY MOUTH ONCE DAILY  . calcium carbonate (OS-CAL) 600 MG TABS Take 600 mg by mouth daily.   [name] lisinopril (PRINIVIL,ZESTRIL) 30 MG tablet Take 1 tablet (30 mg total) by mouth daily.  . rosuvastatin (CRESTOR) 5 MG tablet TAKE ONE TABLET BY MOUTH daily  . vitamin E 400 UNIT capsule Take 400 Units by mouth daily.   . [DISCONTINUED]  lisinopril (PRINIVIL,ZESTRIL) 30 MG tablet TAKE 1 TABLET BY MOUTH ONCE DAILY  . [DISCONTINUED] rosuvastatin (CRESTOR) 5 MG tablet TAKE ONE TABLET BY MOUTH daily   No facility-administered encounter medications on file as of 09/17/2017.     Review of Systems  Constitutional: Negative for appetite change and unexpected weight change.  HENT: Negative for congestion and sinus pressure.   Eyes: Negative for pain and visual disturbance.  Respiratory: Negative for cough, chest tightness and shortness of breath.   Cardiovascular: Negative for chest pain, palpitations and leg swelling.  Gastrointestinal: Negative for abdominal pain, diarrhea and nausea.  Genitourinary: Negative for difficulty urinating and dysuria.  Musculoskeletal: Negative for joint swelling and myalgias.  Skin: Negative for color change and rash.  Neurological: Negative for dizziness, light-headedness and headaches.  Hematological: Negative for adenopathy. Does not bruise/bleed easily.  Psychiatric/Behavioral: Negative for  agitation and dysphoric mood.       Objective:    Physical Exam  Constitutional: She is oriented to person, place, and time. She appears well-developed and well-nourished. No distress.  HENT:  Nose: Nose normal.  Mouth/Throat: Oropharynx is clear and moist.  Eyes: Right eye exhibits no discharge. Left eye exhibits no discharge. No scleral icterus.  Neck: Neck supple. No thyromegaly present.  Cardiovascular: Normal rate and regular rhythm.  Pulmonary/Chest: Breath sounds normal. No accessory muscle usage. No tachypnea. No respiratory distress. She has no decreased breath sounds. She has no wheezes. She has no rhonchi. Right breast exhibits no inverted nipple, no mass, no nipple discharge and no tenderness (no axillary adenopathy). Left breast exhibits no inverted nipple, no mass, no nipple discharge and no tenderness (no axilarry adenopathy).  Abdominal: Soft. Bowel sounds are normal. There is no tenderness.  Musculoskeletal: She exhibits no edema or tenderness.  Lymphadenopathy:    She has no cervical adenopathy.  Neurological: She is alert and oriented to person, place, and time.  Skin: No rash noted. No erythema.  Psychiatric: She has a normal mood and affect. Her behavior is normal.    BP 124/78 (BP Location: Left Arm, Patient Position: Sitting, Cuff Size: Normal)   Pulse 78   Temp 98.4 F (36.9 C) (Oral)   Resp 16   Ht 5' 3" (1.6 m)   Wt 155 lb 3.2 oz (70.4 kg)   LMP 06/28/1982   SpO2 99%   BMI 27.49 kg/m  Wt Readings from Last 3 Encounters:  09/17/17 155 lb 3.2 oz (70.4 kg)  05/15/17 172 lb (78 kg)  01/09/17 171 lb 12.8 oz (77.9 kg)     Lab Results  Component Value Date   WBC 3.5 (L) 09/17/2017   HGB 16.2 (H) 09/17/2017   HCT 47.7 (H) 09/17/2017   PLT 222.0 09/17/2017   GLUCOSE 110 (H) 09/17/2017   CHOL 147 09/17/2017   TRIG 50.0 09/17/2017   HDL 60.30 09/17/2017   LDLDIRECT 150.8 03/02/2013   LDLCALC 77 09/17/2017   ALT 28 09/17/2017   AST 21 09/17/2017    NA 139 09/17/2017   K 4.6 09/17/2017   CL 103 09/17/2017   CREATININE 0.83 09/17/2017   BUN 20 09/17/2017   CO2 31 09/17/2017   TSH 1.02 05/13/2017   HGBA1C 5.7 09/17/2017    Mm Screening Breast Tomo Bilateral  Result Date: 12/04/2016 CLINICAL DATA:  Screening. EXAM: 2D DIGITAL SCREENING BILATERAL MAMMOGRAM WITH CAD AND ADJUNCT TOMO COMPARISON:  Previous exam(s). ACR Breast Density Category b: There are scattered areas of fibroglandular density. FINDINGS: There are no findings suspicious for   malignancy. Images were processed with CAD. IMPRESSION: No mammographic evidence of malignancy. A result letter of this screening mammogram will be mailed directly to the patient. RECOMMENDATION: Screening mammogram in one year. (Code:SM-B-01Y) BI-RADS CATEGORY  1: Negative. Electronically Signed   By: Michelle  Collins M.D.   On: 12/04/2016 10:33       Assessment & Plan:   Problem List Items Addressed This Visit    Abnormal liver function    Previous abdominal ultrasound negative.  Follow liver function tests.        Relevant Orders   Hepatic function panel (Completed)   Essential hypertension, benign    Blood pressure under good control.  Continue same medication regimen.  Follow pressures.  Follow metabolic panel.        Relevant Medications   rosuvastatin (CRESTOR) 5 MG tablet   lisinopril (PRINIVIL,ZESTRIL) 30 MG tablet   Other Relevant Orders   Lipid panel (Completed)   Basic metabolic panel (Completed)   Health care maintenance    Physical today 09/17/17.  Mammogram 12/04/16 - Birads I.  Colonoscopy 07/2016.  Recommended f/u in 3 years.        History of colon cancer    Colonoscopy 07/2016.  Recommended f/u colonoscopy in 3 years.        Relevant Orders   CEA (Completed)   Hyperglycemia    Low carb diet and exercise.  Follow met b and a1c.        Relevant Orders   Hemoglobin A1c (Completed)   Leukopenia    Follow cbc.  Has been stable.        Relevant Orders   CBC with  Differential/Platelet (Completed)   Pure hypercholesterolemia    On crestor.  Low cholesterol diet and exercise.  Follow lipid panel and liver function tests.        Relevant Medications   rosuvastatin (CRESTOR) 5 MG tablet   lisinopril (PRINIVIL,ZESTRIL) 30 MG tablet    Other Visit Diagnoses    Breast cancer screening    -  Primary   Relevant Orders   MM DIGITAL SCREENING BILATERAL   Need for 23-polyvalent pneumococcal polysaccharide vaccine       Relevant Orders   Pneumococcal polysaccharide vaccine 23-valent greater than or equal to 2yo subcutaneous/IM (Completed)       SCOTT, CHARLENE, MD  

## 2017-09-18 DIAGNOSIS — Z23 Encounter for immunization: Secondary | ICD-10-CM | POA: Diagnosis not present

## 2017-09-18 DIAGNOSIS — Z0000 Encounter for general adult medical examination without abnormal findings: Secondary | ICD-10-CM | POA: Diagnosis not present

## 2017-09-18 LAB — CEA: CEA: 2.5 ng/mL — ABNORMAL HIGH

## 2017-09-22 ENCOUNTER — Other Ambulatory Visit: Payer: Self-pay | Admitting: Internal Medicine

## 2017-09-22 DIAGNOSIS — D582 Other hemoglobinopathies: Secondary | ICD-10-CM

## 2017-09-22 NOTE — Progress Notes (Signed)
Order placed for hematology referral.  

## 2017-09-27 ENCOUNTER — Encounter: Payer: Self-pay | Admitting: Internal Medicine

## 2017-09-27 NOTE — Assessment & Plan Note (Signed)
Follow cbc.  Has been stable.  

## 2017-09-27 NOTE — Assessment & Plan Note (Signed)
Colonoscopy 07/2016.  Recommended f/u colonoscopy in 3 years.   

## 2017-09-27 NOTE — Assessment & Plan Note (Signed)
Blood pressure under good control.  Continue same medication regimen.  Follow pressures.  Follow metabolic panel.   

## 2017-09-27 NOTE — Assessment & Plan Note (Signed)
Low carb diet and exercise.  Follow met b and a1c.

## 2017-09-27 NOTE — Assessment & Plan Note (Signed)
On crestor.  Low cholesterol diet and exercise.  Follow lipid panel and liver function tests.   

## 2017-09-27 NOTE — Assessment & Plan Note (Signed)
Previous abdominal ultrasound negative.  Follow liver function tests.   

## 2017-10-01 ENCOUNTER — Inpatient Hospital Stay: Payer: BLUE CROSS/BLUE SHIELD | Attending: Oncology | Admitting: Oncology

## 2017-10-01 ENCOUNTER — Encounter: Payer: Self-pay | Admitting: Oncology

## 2017-10-01 ENCOUNTER — Inpatient Hospital Stay: Payer: BLUE CROSS/BLUE SHIELD

## 2017-10-01 VITALS — BP 144/83 | HR 88 | Resp 16 | Wt 156.9 lb

## 2017-10-01 DIAGNOSIS — D751 Secondary polycythemia: Secondary | ICD-10-CM

## 2017-10-01 DIAGNOSIS — Z87891 Personal history of nicotine dependence: Secondary | ICD-10-CM | POA: Diagnosis not present

## 2017-10-01 DIAGNOSIS — Z79899 Other long term (current) drug therapy: Secondary | ICD-10-CM | POA: Insufficient documentation

## 2017-10-01 DIAGNOSIS — Z85038 Personal history of other malignant neoplasm of large intestine: Secondary | ICD-10-CM | POA: Diagnosis not present

## 2017-10-01 DIAGNOSIS — R970 Elevated carcinoembryonic antigen [CEA]: Secondary | ICD-10-CM

## 2017-10-01 DIAGNOSIS — I10 Essential (primary) hypertension: Secondary | ICD-10-CM | POA: Diagnosis not present

## 2017-10-01 DIAGNOSIS — E785 Hyperlipidemia, unspecified: Secondary | ICD-10-CM | POA: Insufficient documentation

## 2017-10-01 DIAGNOSIS — Z86010 Personal history of colonic polyps: Secondary | ICD-10-CM | POA: Insufficient documentation

## 2017-10-01 NOTE — Progress Notes (Signed)
Patient is here today for a new patient visit. Patient states no concerns today.

## 2017-10-01 NOTE — Progress Notes (Signed)
Hematology/Oncology Consult note [facility] Telephone:([phone] [phone]   Patient Care Team: [facility] as PCP - General (Internal Medicine)  REFERRING PROVIDER: [name], [facility]  CHIEF COMPLAINTS/PURPOSE OF CONSULTATION:  Evaluation of elevated hemoglobin.  HISTORY OF PRESENTING ILLNESS:  Susan Weeks is a  67 y.o.  female with PMH listed below who was referred to me for evaluation of elevated hemoglobin.  Recent hemoglobin 16.2, hct 47.7, reviewed her previous labs, hemoglobin has been chronically high since 2017, ranging from 15.1 to 16.2.   Pertinent Oncology Histroy  Previous history of carcinoma of colon status post resection, has an intermittent rising CEA.  Status post resection, no evidence of malignancy. Patient had exploratory laparotomy because of abnormal PET scan following rising CEA and was negative for any malignancy. CEA  remains stable in the range of 5.0   Patient reports feeling well at baseline. She was former smoker. No second hand smoking.  Denies any family history of high hemoglobin, DVT. No personal history of DVT  Review of Systems  Constitutional: Negative for chills, fever, malaise/fatigue and weight loss.  HENT: Negative for congestion, ear discharge, ear pain, nosebleeds, sinus pain and sore throat.   Eyes: Negative for double vision, photophobia, pain, discharge and redness.  Respiratory: Negative for cough, hemoptysis, sputum production, shortness of breath and wheezing.   Cardiovascular: Negative for chest pain, palpitations, orthopnea, claudication and leg swelling.  Gastrointestinal: Negative for abdominal pain, blood in stool, constipation, diarrhea, heartburn, melena, nausea and vomiting.  Genitourinary: Negative for dysuria, flank pain, frequency and hematuria.  Musculoskeletal: Negative for back pain, myalgias and neck pain.  Skin: Negative for itching and rash.  Neurological: Negative  for dizziness, tingling, tremors, focal weakness, weakness and headaches.  Endo/Heme/Allergies: Negative for environmental allergies. Does not bruise/bleed easily.  Psychiatric/Behavioral: Negative for depression and hallucinations. The patient is not nervous/anxious.     MEDICAL HISTORY:  Past Medical History:  Diagnosis Date  . Abnormal liver function   . Colon cancer ([place])    adenocarcinoma of the cecum s/p partial colon resection  . History of colon polyps   . Hyperlipidemia   . Hypertension     SURGICAL HISTORY: Past Surgical History:  Procedure Laterality Date  . ABDOMINAL HYSTERECTOMY  1984   secondary to bleeding and fibroid tumors  . APPENDECTOMY    . COLONOSCOPY W/ POLYPECTOMY  2001  . COLONOSCOPY WITH PROPOFOL N/A 07/28/2016   Procedure: COLONOSCOPY WITH PROPOFOL;  Surgeon: Susan Sails, [facility];  Location: [facility];  Service: Endoscopy;  Laterality: N/A;    SOCIAL HISTORY: Social History   Socioeconomic History  . Marital status: Married    Spouse name: Susan Weeks  . Number of children: 1  . Years of education: HS  . Highest education level: Not on file  Occupational History    Employer: OTHER    Comment: [employer]  Social Needs  . Financial resource strain: Not on file  . Food insecurity:    Worry: Not on file    Inability: Not on file  . Transportation needs:    Medical: Not on file    Non-medical: Not on file  Tobacco Use  . Smoking status: Former Research scientist (life sciences)  . Smokeless tobacco: Never Used  Substance and Sexual Activity  . Alcohol use: No    Alcohol/week: 0.0 oz  . Drug use: No  . Sexual activity: Not on file  Lifestyle  . Physical activity:    Days per week: Not on  file    Minutes per session: Not on file  . Stress: Not on file  Relationships  . Social connections:    Talks on phone: Not on file    Gets together: Not on file    Attends religious service: Not on file    Active member of club or organization: Not on file    Attends  meetings of clubs or organizations: Not on file    Relationship status: Not on file  . Intimate partner violence:    Fear of current or ex partner: Not on file    Emotionally abused: Not on file    Physically abused: Not on file    Forced sexual activity: Not on file  Other Topics Concern  . Not on file  Social History Narrative  . Not on file    FAMILY HISTORY: Family History  Problem Relation Age of Onset  . Stroke Mother   . Hypertension Mother   . Cancer Father        Prostate   . Heart disease Brother        myocardial infarction  . Hypertension Brother   . Hypertension Sister        x4  . Breast cancer Neg Hx     ALLERGIES:  is allergic to z-pak [azithromycin].  MEDICATIONS:  Current Outpatient Medications  Medication Sig Dispense Refill  . amLODipine (NORVASC) 5 MG tablet TAKE 1 TABLET BY MOUTH ONCE DAILY 90 tablet 1  . calcium carbonate (OS-CAL) 600 MG TABS Take 600 mg by mouth daily.     [name] lisinopril (PRINIVIL,ZESTRIL) 30 MG tablet Take 1 tablet (30 mg total) by mouth daily. 90 tablet 1  . rosuvastatin (CRESTOR) 5 MG tablet TAKE ONE TABLET BY MOUTH daily 90 tablet 1  . vitamin E 400 UNIT capsule Take 400 Units by mouth daily.      No current facility-administered medications for this visit.      PHYSICAL EXAMINATION: ECOG PERFORMANCE STATUS: 0 - Asymptomatic Vitals:   10/01/17 1448  BP: (!) 144/83  Pulse: 88  Resp: 16   Filed Weights   10/01/17 1448  Weight: 156 lb 14.4 oz (71.2 kg)    Physical Exam  Constitutional: She is oriented to person, place, and time. She appears well-developed and well-nourished. No distress.  HENT:  Head: Normocephalic and atraumatic.  Right Ear: External ear normal.  Left Ear: External ear normal.  Mouth/Throat: Oropharynx is clear and moist.  Eyes: Pupils are equal, round, and reactive to light. Conjunctivae and EOM are normal. No scleral icterus.  Neck: Normal range of motion. Neck supple.  Cardiovascular: Normal  rate, regular rhythm and normal heart sounds.  Pulmonary/Chest: Effort normal and breath sounds normal. No respiratory distress. She has no wheezes. She has no rales. She exhibits no tenderness.  Abdominal: Soft. Bowel sounds are normal. She exhibits no distension and no mass. There is no tenderness.  Musculoskeletal: Normal range of motion. She exhibits no edema or deformity.  Lymphadenopathy:    She has no cervical adenopathy.  Neurological: She is alert and oriented to person, place, and time. No cranial nerve deficit. Coordination normal.  Skin: Skin is warm and dry. No rash noted.  Psychiatric: She has a normal mood and affect. Her behavior is normal.     LABORATORY DATA:  I have reviewed the data as listed Lab Results  Component Value Date   WBC 3.5 (L) 09/17/2017   HGB 16.2 (H) 09/17/2017   HCT 47.7 (H)  09/17/2017   MCV 87.0 09/17/2017   PLT 222.0 09/17/2017   Recent Labs    11/14/16 1502 01/02/17 1236 05/13/17 0755 09/17/17 0859  NA  --  137 137 139  K  --  4.5 4.8 4.6  CL  --  102 101 103  CO2  --  30 30 31   GLUCOSE  --  98 104* 110*  BUN  --  14 18 20   CREATININE  --  0.78 0.84 0.83  CALCIUM  --  9.5 9.4 9.4  PROT 6.5 7.1 7.1 7.2  ALBUMIN 4.1 4.4 4.5 4.2  AST 22 20 19 21   ALT 35* 25 30 28   ALKPHOS 75 56 57 52  BILITOT 0.6 1.0 1.0 0.7  BILIDIR 0.1 0.2 0.2 0.1  IBILI 0.5  --   --   --        ASSESSMENT & PLAN:  1. Erythrocytosis   2. History of colon cancer   3. Elevated carcinoembryonic antigen (CEA)    Lab results discussed with patient. She has very mild erythrocytosis, primary vs secondary. Will start with lab work up with  Will check erythropoietin, carbo monoxide level, rule out primary etiology, JAK2 with reflex to other mutations, BCR-ABL.   History of remote colon cancer, s/p surgery. Chronically elevated CEA, and has work up in the past which showed no recurrence.  Recent CEA showed elevated CEA, at her chronically elevated baseline.    Continue monitor. Encourage patient to continue follow up with GI for surveillance.    All questions were answered. The patient knows to call the clinic with any problems questions or concerns.  Return of visit: 2 weeks. Thank you for this kind referral and the opportunity to participate in the care of this patient. A copy of today's note is routed to referring provider  Total face to face encounter time for this patient visit was 82min. >50% of the time was  spent in counseling and coordination of care.    Earlie Server, MD, PhD Hematology Oncology [facility] Pager- [phone] 10/01/2017

## 2017-10-02 LAB — CARBON MONOXIDE, BLOOD (PERFORMED AT REF LAB): Carbon Monoxide, Blood: 2.7 % (ref 0.0–3.6)

## 2017-10-02 LAB — ERYTHROPOIETIN: Erythropoietin: 10.8 m[IU]/mL (ref 2.6–18.5)

## 2017-10-08 LAB — CALR + JAK2 E12-15 + MPL (REFLEXED)

## 2017-10-08 LAB — JAK2 V617F, W REFLEX TO CALR/E12/MPL

## 2017-10-15 LAB — BCR-ABL1 FISH
Cells Analyzed: 200
Cells Counted: 200

## 2017-10-16 ENCOUNTER — Inpatient Hospital Stay: Payer: BLUE CROSS/BLUE SHIELD | Attending: Oncology | Admitting: Oncology

## 2017-10-16 ENCOUNTER — Other Ambulatory Visit: Payer: Self-pay

## 2017-10-16 ENCOUNTER — Encounter: Payer: Self-pay | Admitting: Oncology

## 2017-10-16 VITALS — BP 144/76 | HR 97 | Temp 96.9°F | Resp 18 | Wt 153.8 lb

## 2017-10-16 DIAGNOSIS — Z85038 Personal history of other malignant neoplasm of large intestine: Secondary | ICD-10-CM | POA: Diagnosis not present

## 2017-10-16 DIAGNOSIS — I10 Essential (primary) hypertension: Secondary | ICD-10-CM | POA: Diagnosis not present

## 2017-10-16 DIAGNOSIS — Z79899 Other long term (current) drug therapy: Secondary | ICD-10-CM

## 2017-10-16 DIAGNOSIS — Z7982 Long term (current) use of aspirin: Secondary | ICD-10-CM

## 2017-10-16 DIAGNOSIS — Z86010 Personal history of colonic polyps: Secondary | ICD-10-CM | POA: Insufficient documentation

## 2017-10-16 DIAGNOSIS — R970 Elevated carcinoembryonic antigen [CEA]: Secondary | ICD-10-CM

## 2017-10-16 DIAGNOSIS — Z87891 Personal history of nicotine dependence: Secondary | ICD-10-CM | POA: Diagnosis not present

## 2017-10-16 DIAGNOSIS — E785 Hyperlipidemia, unspecified: Secondary | ICD-10-CM

## 2017-10-16 DIAGNOSIS — D751 Secondary polycythemia: Secondary | ICD-10-CM | POA: Diagnosis not present

## 2017-10-16 MED ORDER — ASPIRIN EC 81 MG PO TBEC: 81.0000 mg | DELAYED_RELEASE_TABLET | Freq: Every day | ORAL | 3 refills | Status: DC

## 2017-10-16 NOTE — Progress Notes (Signed)
Hematology/Oncology Consult note [facility] Telephone:([phone] [phone]   Patient Care Team: [name], MD as PCP - General (Internal Medicine)  REFERRING PROVIDER: [name], MD  CHIEF COMPLAINTS/PURPOSE OF CONSULTATION:  Evaluation of elevated hemoglobin.  HISTORY OF PRESENTING ILLNESS:  Susan We[facility] is a  67 y.o.  female with PMH listed below who was referred to me for evaluation of elevated hemoglobin.  Recent hemoglobin 16.2, hct 47.7, reviewed her previous labs, hemoglobin has been chronically high since 2017, ranging from 15.1 to 16.2.   Pertinent Oncology Histroy  Previous history of carcinoma of colon status post resection, has an intermittent rising CEA.  Status post resection, no evidence of malignancy. Patient had exploratory laparotomy because of abnormal PET scan following rising CEA and was negative for any malignancy. CEA  remains stable in the range of 5.0   Patient reports feeling well at baseline. She was former smoker. No second hand smoking.  Denies any family history of high hemoglobin, DVT. No personal history of DVT  INTERVAL HISTORY Susan Weeks is a 67 y.o. female who has above history reviewed by me today presents for follow up visit for management of error.  She has had a lab work-up last visit to discuss lab results and management plan.  Continues to feel well.  No complaints.   Review of Systems  Constitutional: Negative for chills, fever, malaise/fatigue and weight loss.  HENT: Negative for congestion, ear discharge, ear pain, nosebleeds, sinus pain and sore throat.   Eyes: Negative for double vision, photophobia, pain, discharge and redness.  Respiratory: Negative for cough, hemoptysis, sputum production, shortness of breath and wheezing.   Cardiovascular: Negative for chest pain, palpitations, orthopnea, claudication and leg swelling.  Gastrointestinal: Negative for abdominal pain, blood in  stool, constipation, diarrhea, heartburn, melena, nausea and vomiting.  Genitourinary: Negative for dysuria, flank pain, frequency and hematuria.  Musculoskeletal: Negative for back pain, myalgias and neck pain.  Skin: Negative for itching and rash.  Neurological: Negative for dizziness, tingling, tremors, focal weakness, weakness and headaches.  Endo/Heme/Allergies: Negative for environmental allergies. Does not bruise/bleed easily.  Psychiatric/Behavioral: Negative for depression and hallucinations. The patient is not nervous/anxious.     MEDICAL HISTORY:  Past Medical History:  Diagnosis Date  . Abnormal liver function   . Colon cancer ([place])    adenocarcinoma of the cecum s/p partial colon resection  . History of colon polyps   . Hyperlipidemia   . Hypertension     SURGICAL HISTORY: Past Surgical History:  Procedure Laterality Date  . ABDOMINAL HYSTERECTOMY  1984   secondary to bleeding and fibroid tumors  . APPENDECTOMY    . COLONOSCOPY W/ POLYPECTOMY  2001  . COLONOSCOPY WITH PROPOFOL N/A 07/28/2016   Procedure: COLONOSCOPY WITH PROPOFOL;  Surgeon: [name], MD;  Location: [facility] ENDOSCOPY;  Service: Endoscopy;  Laterality: N/A;    SOCIAL HISTORY: Social History   Socioeconomic History  . Marital status: Married    Spouse name: [name]  . Number of children: 1  . Years of education: HS  . Highest education level: Not on file  Occupational History    Employer: OTHER    Comment: [employer]  Social Needs  . Financial resource strain: Not on file  . Food insecurity:    Worry: Not on file    Inability: Not on file  . Transportation needs:    Medical: Not on file    Non-medical: Not on file  Tobacco Use  .  Smoking status: Former Research scientist (life sciences)  . Smokeless tobacco: Never Used  Substance and Sexual Activity  . Alcohol use: No    Alcohol/week: 0.0 oz  . Drug use: No  . Sexual activity: Not on file  Lifestyle  . Physical activity:    Days per week: Not on  file    Minutes per session: Not on file  . Stress: Not on file  Relationships  . Social connections:    Talks on phone: Not on file    Gets together: Not on file    Attends religious service: Not on file    Active member of club or organization: Not on file    Attends meetings of clubs or organizations: Not on file    Relationship status: Not on file  . Intimate partner violence:    Fear of current or ex partner: Not on file    Emotionally abused: Not on file    Physically abused: Not on file    Forced sexual activity: Not on file  Other Topics Concern  . Not on file  Social History Narrative  . Not on file    FAMILY HISTORY: Family History  Problem Relation Age of Onset  . Stroke Mother   . Hypertension Mother   . Cancer Father        Prostate   . Heart disease Brother        myocardial infarction  . Hypertension Brother   . Hypertension Sister        x4  . Breast cancer Neg Hx     ALLERGIES:  is allergic to z-pak [azithromycin].  MEDICATIONS:  Current Outpatient Medications  Medication Sig Dispense Refill  . amLODipine (NORVASC) 5 MG tablet TAKE 1 TABLET BY MOUTH ONCE DAILY 90 tablet 1  . calcium carbonate (OS-CAL) 600 MG TABS Take 600 mg by mouth daily.     [name] lisinopril (PRINIVIL,ZESTRIL) 30 MG tablet Take 1 tablet (30 mg total) by mouth daily. 90 tablet 1  . rosuvastatin (CRESTOR) 5 MG tablet TAKE ONE TABLET BY MOUTH daily 90 tablet 1  . vitamin E 400 UNIT capsule Take 400 Units by mouth daily.      No current facility-administered medications for this visit.      PHYSICAL EXAMINATION: ECOG PERFORMANCE STATUS: 0 - Asymptomatic Vitals:   10/16/17 1347  BP: (!) 144/76  Pulse: 97  Resp: 18  Temp: (!) 96.9 F (36.1 C)  SpO2: 99%   Filed Weights   10/16/17 1347  Weight: 153 lb 12.8 oz (69.8 kg)    Physical Exam  Constitutional: She is oriented to person, place, and time. She appears well-developed and well-nourished. No distress.  HENT:  Head:  Normocephalic and atraumatic.  Right Ear: External ear normal.  Left Ear: External ear normal.  Mouth/Throat: Oropharynx is clear and moist.  Eyes: Pupils are equal, round, and reactive to light. Conjunctivae and EOM are normal. No scleral icterus.  Neck: Normal range of motion. Neck supple.  Cardiovascular: Normal rate, regular rhythm and normal heart sounds.  Pulmonary/Chest: Effort normal and breath sounds normal. No respiratory distress. She has no wheezes. She has no rales. She exhibits no tenderness.  Abdominal: Soft. Bowel sounds are normal. She exhibits no distension and no mass. There is no tenderness.  Musculoskeletal: Normal range of motion. She exhibits no edema or deformity.  Lymphadenopathy:    She has no cervical adenopathy.  Neurological: She is alert and oriented to person, place, and time. No cranial nerve deficit.  Coordination normal.  Skin: Skin is warm and dry. No rash noted.  Psychiatric: She has a normal mood and affect. Her behavior is normal. Thought content normal.     LABORATORY DATA:  I have reviewed the data as listed Lab Results  Component Value Date   WBC 3.5 (L) 09/17/2017   HGB 16.2 (H) 09/17/2017   HCT 47.7 (H) 09/17/2017   MCV 87.0 09/17/2017   PLT 222.0 09/17/2017   Recent Labs    11/14/16 1502 01/02/17 1236 05/13/17 0755 09/17/17 0859  NA  --  137 137 139  K  --  4.5 4.8 4.6  CL  --  102 101 103  CO2  --  30 30 31   GLUCOSE  --  98 104* 110*  BUN  --  14 18 20   CREATININE  --  0.78 0.84 0.83  CALCIUM  --  9.5 9.4 9.4  PROT 6.5 7.1 7.1 7.2  ALBUMIN 4.1 4.4 4.5 4.2  AST 22 20 19 21   ALT 35* 25 30 28   ALKPHOS 75 56 57 52  BILITOT 0.6 1.0 1.0 0.7  BILIDIR 0.1 0.2 0.2 0.1  IBILI 0.5  --   --   --        ASSESSMENT & PLAN:  1. Erythrocytosis    Labs were reviewed and discussed with patient.  She has very mild erythrocytosis, Jak 2 with reflex mutations negative, BCR ABL negative normal couple monoxide level.  Normal  erythropoietin level. Discussed with patient that most likely she does not have a primary.  Recommend patient to establish care with pulmonology for work-up for sleep apnea. Referred to Dr. Felicie Morn.All questions were answered. The patient knows to call the clinic with any problems questions or concerns. Hold phlebotomy today.  I suggest patient to take aspirin 81 mg daily.  Patient said that she will buy.  Over-the-counter aspirin.  Orders Placed This Encounter  Procedures  . Ambulatory referral to Pulmonology    Referral Priority:   Routine    Referral Type:   Consultation    Referral Reason:   Specialty Services Required    Referred to Provider:   Laverle Hobby, MD    Requested Specialty:   Pulmonary Disease    Number of Visits Requested:   1    Return of visit: 3 months for reassessment for need of phlebotomy. Earlie Server, MD, PhD Hematology Oncology [facility] Pager- [phone] 10/16/2017

## 2017-10-16 NOTE — Progress Notes (Signed)
Patient here today for follow up. Concerned about bloodwork.

## 2017-10-30 NOTE — Progress Notes (Signed)
[facility]      Assessment and Plan:  Excessive daytime sleepiness and erythrocytosis.  --Will send for sleep study.   Orders Placed This Encounter  Procedures  . Home sleep test   Return for Come back in 3 months if started on CPAP. [name]   Date: 11/02/2017  MRN# [id number] OMNIA DOLLINGER 1951/05/05   [facility]an [facility] is a 67 y.o. old female seen in [facility] for chief complaint of:    Chief Complaint  Patient presents with  . Consult    Referred by Dr. Tasia Catchings for Erytrocytosis:    HPI:  The patient is a 67 year old female with a history of colon cancer status post resection.  Patient was recently seen in oncology due to finding of erythrocytosis, work-up has been negative thus far with normal carbon monoxide level, normal lower throat for weight and level, she is therefore referred for work-up for possible obstructive sleep apnea. She feels that she sleeps well, she gets hot flashes which can wake her from sleep. She has mild daytime sleepiness, and occasionally take a nap during the day. She has no morning headache.  Denies jaw pain or TMJ. Denies sleep walking or sleep attacks.  Denies reflux, rhinitis.   **Hemoglobin 09/17/2017>> 16.2. **Absolute eosinophil count 09/17/2017>> 100.   PMHX:   Past Medical History:  Diagnosis Date  . Abnormal liver function   . Colon cancer ([place])    adenocarcinoma of the cecum s/p partial colon resection  . History of colon polyps   . Hyperlipidemia   . Hypertension    Surgical Hx:  Past Surgical History:  Procedure Laterality Date  . ABDOMINAL HYSTERECTOMY  1984   secondary to bleeding and fibroid tumors  . APPENDECTOMY    . COLONOSCOPY W/ POLYPECTOMY  2001  . COLONOSCOPY WITH PROPOFOL N/A 07/28/2016   Procedure: COLONOSCOPY WITH PROPOFOL;  Surgeon: [name], MD;  Location: [facility] ENDOSCOPY;  Service: Endoscopy;  Laterality: N/A;   Family Hx:  Family History  Problem Relation Age of Onset    . Stroke Mother   . Hypertension Mother   . Cancer Father        Prostate   . Heart disease Brother        myocardial infarction  . Hypertension Brother   . Hypertension Sister        x4  . Breast cancer Neg Hx    Social Hx:   Social History   Tobacco Use  . Smoking status: Former Research scientist (life sciences)  . Smokeless tobacco: Never Used  Substance Use Topics  . Alcohol use: No    Alcohol/week: 0.0 oz  . Drug use: No   Medication:    Current Outpatient Medications:  .  amLODipine (NORVASC) 5 MG tablet, TAKE 1 TABLET BY MOUTH ONCE DAILY, Disp: 90 tablet, Rfl: 1 .  aspirin EC 81 MG tablet, Take 1 tablet (81 mg total) by mouth daily., Disp: 90 tablet, Rfl: 3 .  calcium carbonate (OS-CAL) 600 MG TABS, Take 600 mg by mouth daily. , Disp: , Rfl:  .  lisinopril (PRINIVIL,ZESTRIL) 30 MG tablet, Take 1 tablet (30 mg total) by mouth daily., Disp: 90 tablet, Rfl: 1 .  rosuvastatin (CRESTOR) 5 MG tablet, TAKE ONE TABLET BY MOUTH daily, Disp: 90 tablet, Rfl: 1 .  vitamin E 400 UNIT capsule, Take 400 Units by mouth daily. , Disp: , Rfl:    Allergies:  Z-pak [azithromycin]  Review of Systems: Gen:  Denies  fever, sweats,  chills HEENT: Denies blurred vision, double vision. bleeds, sore throat Cvc:  No dizziness, chest pain. Resp:   Denies cough or sputum production, shortness of breath Gi: Denies swallowing difficulty, stomach pain. Gu:  Denies bladder incontinence, burning urine Ext:   No Joint pain, stiffness. Skin: No skin rash,  hives  Endoc:  No polyuria, polydipsia. Psych: No depression, insomnia. Other:  All other systems were reviewed with the patient and were negative other that what is mentioned in the HPI.   Physical Examination:   VS: BP 140/80 (BP Location: Left Arm, Cuff Size: Normal)   Pulse 95   Resp 16   Ht 5\' 3"  (1.6 m)   Wt 153 lb (69.4 kg)   LMP 06/28/1982   SpO2 97%   BMI 27.10 kg/m   General Appearance: No distress  Neuro:without focal findings,  speech normal,   HEENT: PERRLA, EOM intact.  Mallampati 2 Pulmonary: normal breath sounds, No wheezing.  CardiovascularNormal S1,S2.  No m/r/g.   Abdomen: Benign, Soft, non-tender. Renal:  No costovertebral tenderness  GU:  No performed at this time. Endoc: No evident thyromegaly, no signs of acromegaly. Skin:   warm, no rashes, no ecchymosis  Extremities: normal, no cyanosis, clubbing.  Other findings:    LABORATORY PANEL:   CBC No results for input(s): WBC, HGB, HCT, PLT in the last 168 hours. ------------------------------------------------------------------------------------------------------------------  Chemistries  No results for input(s): NA, K, CL, CO2, GLUCOSE, BUN, CREATININE, CALCIUM, MG, AST, ALT, ALKPHOS, BILITOT in the last 168 hours.  Invalid input(s): GFRCGP ------------------------------------------------------------------------------------------------------------------  Cardiac Enzymes No results for input(s): TROPONINI in the last 168 hours. ------------------------------------------------------------  RADIOLOGY:  No results found.     Thank  you for the [facility] and for allowing [facility] to assist in the care of your patient. Our recommendations are noted above.  Please contact us if we can be of further service.   Marda Stalker, M.D., F.C.C.P.  Board Certified in Internal Medicine, Pulmonary Medicine, [place], and Sleep Medicine.  [facility] Office Number: [phone]   11/02/2017

## 2017-11-02 ENCOUNTER — Encounter: Payer: Self-pay | Admitting: Internal Medicine

## 2017-11-02 ENCOUNTER — Ambulatory Visit (INDEPENDENT_AMBULATORY_CARE_PROVIDER_SITE_OTHER): Payer: BLUE CROSS/BLUE SHIELD | Admitting: Internal Medicine

## 2017-11-02 VITALS — BP 140/80 | HR 95 | Resp 16 | Ht 63.0 in | Wt 153.0 lb

## 2017-11-02 DIAGNOSIS — G4719 Other hypersomnia: Secondary | ICD-10-CM | POA: Diagnosis not present

## 2017-11-02 NOTE — Patient Instructions (Signed)

## 2017-11-06 DIAGNOSIS — H6123 Impacted cerumen, bilateral: Secondary | ICD-10-CM | POA: Diagnosis not present

## 2017-12-08 ENCOUNTER — Other Ambulatory Visit: Payer: Self-pay | Admitting: Internal Medicine

## 2017-12-08 ENCOUNTER — Ambulatory Visit
Admission: RE | Admit: 2017-12-08 | Discharge: 2017-12-08 | Disposition: A | Discharge status: Home / Self Care | Payer: BLUE CROSS/BLUE SHIELD | Source: Ambulatory Visit | Attending: Internal Medicine | Admitting: Internal Medicine

## 2017-12-08 DIAGNOSIS — Z1231 Encounter for screening mammogram for malignant neoplasm of breast: Secondary | ICD-10-CM | POA: Diagnosis not present

## 2017-12-08 DIAGNOSIS — Z1239 Encounter for other screening for malignant neoplasm of breast: Secondary | ICD-10-CM

## 2017-12-28 ENCOUNTER — Encounter: Payer: Self-pay | Admitting: Internal Medicine

## 2017-12-28 DIAGNOSIS — G4730 Sleep apnea, unspecified: Secondary | ICD-10-CM

## 2017-12-28 DIAGNOSIS — G4719 Other hypersomnia: Secondary | ICD-10-CM

## 2017-12-28 HISTORY — DX: Sleep apnea, unspecified: G47.30

## 2017-12-30 ENCOUNTER — Telehealth: Payer: Self-pay | Admitting: *Deleted

## 2017-12-30 DIAGNOSIS — G4733 Obstructive sleep apnea (adult) (pediatric): Secondary | ICD-10-CM

## 2017-12-30 NOTE — Telephone Encounter (Signed)
LMTCB

## 2017-12-31 NOTE — Telephone Encounter (Signed)
Pt is returning your call, she will be available until 11:55 am today. She is unable to take calls on her job, please call by this time if possible.

## 2018-01-01 NOTE — Telephone Encounter (Signed)
Left message message for patient to call back on my direct ext [phone] for results of sleep study. 2nd message.

## 2018-01-01 NOTE — Telephone Encounter (Signed)
Pt aware Orders placed Nothing further needed. 

## 2018-01-01 NOTE — Telephone Encounter (Signed)
Patient returning our call ° °Please call back ° °

## 2018-01-04 ENCOUNTER — Other Ambulatory Visit: Payer: Self-pay | Admitting: Internal Medicine

## 2018-01-21 ENCOUNTER — Encounter: Payer: Self-pay | Admitting: Internal Medicine

## 2018-01-21 ENCOUNTER — Ambulatory Visit (INDEPENDENT_AMBULATORY_CARE_PROVIDER_SITE_OTHER): Payer: BLUE CROSS/BLUE SHIELD | Admitting: Internal Medicine

## 2018-01-21 DIAGNOSIS — I10 Essential (primary) hypertension: Secondary | ICD-10-CM | POA: Diagnosis not present

## 2018-01-21 DIAGNOSIS — E7800 Pure hypercholesterolemia, unspecified: Secondary | ICD-10-CM

## 2018-01-21 DIAGNOSIS — R945 Abnormal results of liver function studies: Secondary | ICD-10-CM | POA: Diagnosis not present

## 2018-01-21 DIAGNOSIS — D72819 Decreased white blood cell count, unspecified: Secondary | ICD-10-CM | POA: Diagnosis not present

## 2018-01-21 DIAGNOSIS — R739 Hyperglycemia, unspecified: Secondary | ICD-10-CM

## 2018-01-21 DIAGNOSIS — D751 Secondary polycythemia: Secondary | ICD-10-CM

## 2018-01-21 DIAGNOSIS — G4730 Sleep apnea, unspecified: Secondary | ICD-10-CM

## 2018-01-21 MED ORDER — LISINOPRIL 30 MG PO TABS: 30.0000 mg | ORAL_TABLET | Freq: Every day | ORAL | 1 refills | Status: DC

## 2018-01-21 MED ORDER — ROSUVASTATIN CALCIUM 5 MG PO TABS: ORAL_TABLET | ORAL | 1 refills | Status: DC

## 2018-01-21 NOTE — Progress Notes (Signed)
[name]: Susan Weeks, female   DOB: Mar 31, 1951, 66 y.o.   MRN: [id number]   Subjective:    [name]: Susan Weeks, female    DOB: Sep 20, 1950, 67 y.o.   MRN: [id number]  HPI  Patient here for a scheduled follow up.  She reports she is doing well.  Recently saw pulmonary and diagnosed with sleep apnea.  Planning to f/u for treatment and cpap.  Planning to f/u with [facility] next week.  Due to f/u with hematology next week as well.  Overall she feels she is doing well.  No chest pain.  No sob.  No acid reflux.  No abdominal pain.  Bowels moving.  Discussed shingrx.     Past Medical History:  Diagnosis Date  . Abnormal liver function   . Colon cancer ([place])    adenocarcinoma of the cecum s/p partial colon resection  . History of colon polyps   . Hyperlipidemia   . Hypertension    Past Surgical History:  Procedure Laterality Date  . ABDOMINAL HYSTERECTOMY  1984   secondary to bleeding and fibroid tumors  . APPENDECTOMY    . COLONOSCOPY W/ POLYPECTOMY  2001  . COLONOSCOPY WITH PROPOFOL N/A 07/28/2016   Procedure: COLONOSCOPY WITH PROPOFOL;  Surgeon: [name], MD;  Location: [facility] ENDOSCOPY;  Service: Endoscopy;  Laterality: N/A;   Family History  Problem Relation Age of Onset  . Stroke Mother   . Hypertension Mother   . Cancer Father        Prostate   . Heart disease Brother        myocardial infarction  . Hypertension Brother   . Hypertension Sister        x4  . Breast cancer Neg Hx    Social History   Socioeconomic History  . Marital status: Married    Spouse name: [name]  . Number of children: 1  . Years of education: HS  . Highest education level: Not on file  Occupational History    Employer: OTHER    Comment: [employer]  Social Needs  . Financial resource strain: Not on file  . Food insecurity:    Worry: Not on file    Inability: Not on file  . Transportation needs:    Medical: Not on file    Non-medical: Not on file  Tobacco  Use  . Smoking status: Former Research scientist (life sciences)  . Smokeless tobacco: Never Used  Substance and Sexual Activity  . Alcohol use: No    Alcohol/week: 0.0 standard drinks  . Drug use: No  . Sexual activity: Not on file  Lifestyle  . Physical activity:    Days per week: Not on file    Minutes per session: Not on file  . Stress: Not on file  Relationships  . Social connections:    Talks on phone: Not on file    Gets together: Not on file    Attends religious service: Not on file    Active member of club or organization: Not on file    Attends meetings of clubs or organizations: Not on file    Relationship status: Not on file  Other Topics Concern  . Not on file  Social History Narrative  . Not on file    Outpatient Encounter Medications as of 01/21/2018  Medication Sig  . amLODipine (NORVASC) 5 MG tablet TAKE 1 TABLET BY MOUTH ONCE DAILY  . aspirin EC 81 MG tablet Take 1 tablet (81 mg total)  by mouth daily.  . calcium carbonate (OS-CAL) 600 MG TABS Take 600 mg by mouth daily.   [name] lisinopril (PRINIVIL,ZESTRIL) 30 MG tablet Take 1 tablet (30 mg total) by mouth daily.  . rosuvastatin (CRESTOR) 5 MG tablet TAKE ONE TABLET BY MOUTH daily  . vitamin E 400 UNIT capsule Take 400 Units by mouth daily.   . [DISCONTINUED] lisinopril (PRINIVIL,ZESTRIL) 30 MG tablet Take 1 tablet (30 mg total) by mouth daily.  . [DISCONTINUED] rosuvastatin (CRESTOR) 5 MG tablet TAKE ONE TABLET BY MOUTH daily   No facility-administered encounter medications on file as of 01/21/2018.     Review of Systems  Constitutional: Negative for appetite change and unexpected weight change.  HENT: Negative for congestion and sinus pressure.   Respiratory: Negative for cough, chest tightness and shortness of breath.   Cardiovascular: Negative for chest pain, palpitations and leg swelling.  Gastrointestinal: Negative for abdominal pain, diarrhea, nausea and vomiting.  Genitourinary: Negative for difficulty urinating and dysuria.    Musculoskeletal: Negative for joint swelling and myalgias.  Skin: Negative for color change and rash.  Neurological: Negative for dizziness, light-headedness and headaches.  Psychiatric/Behavioral: Negative for agitation and dysphoric mood.       Objective:     Blood pressure rechecked by me:  136/80  Physical Exam  Constitutional: She appears well-developed and well-nourished. No distress.  HENT:  Nose: Nose normal.  Mouth/Throat: Oropharynx is clear and moist.  Neck: Neck supple. No thyromegaly present.  Cardiovascular: Normal rate and regular rhythm.  Pulmonary/Chest: Breath sounds normal. No respiratory distress. She has no wheezes.  Abdominal: Soft. Bowel sounds are normal. There is no tenderness.  Musculoskeletal: She exhibits no edema or tenderness.  Lymphadenopathy:    She has no cervical adenopathy.  Skin: No rash noted. No erythema.  Psychiatric: She has a normal mood and affect. Her behavior is normal.    BP 110/78 (BP Location: Left Arm, Patient Position: Sitting, Cuff Size: Normal)   Pulse 78   Temp 97.8 F (36.6 C) (Oral)   Resp 18   Wt 155 lb 6.4 oz (70.5 kg)   LMP 06/28/1982   SpO2 99%   BMI 27.53 kg/m  Wt Readings from Last 3 Encounters:  01/21/18 155 lb 6.4 oz (70.5 kg)  11/02/17 153 lb (69.4 kg)  10/16/17 153 lb 12.8 oz (69.8 kg)     Lab Results  Component Value Date   WBC 3.5 (L) 09/17/2017   HGB 16.2 (H) 09/17/2017   HCT 47.7 (H) 09/17/2017   PLT 222.0 09/17/2017   GLUCOSE 110 (H) 09/17/2017   CHOL 147 09/17/2017   TRIG 50.0 09/17/2017   HDL 60.30 09/17/2017   LDLDIRECT 150.8 03/02/2013   LDLCALC 77 09/17/2017   ALT 28 09/17/2017   AST 21 09/17/2017   NA 139 09/17/2017   K 4.6 09/17/2017   CL 103 09/17/2017   CREATININE 0.83 09/17/2017   BUN 20 09/17/2017   CO2 31 09/17/2017   TSH 1.02 05/13/2017   HGBA1C 5.7 09/17/2017    Mm 3d Screen Breast Bilateral  Result Date: 12/08/2017 CLINICAL DATA:  Screening. EXAM: DIGITAL  SCREENING BILATERAL MAMMOGRAM WITH TOMO AND CAD COMPARISON:  Previous exam(s). ACR Breast Density Category b: There are scattered areas of fibroglandular density. FINDINGS: There are no findings suspicious for malignancy. Images were processed with CAD. IMPRESSION: No mammographic evidence of malignancy. A result letter of this screening mammogram will be mailed directly to the patient. RECOMMENDATION: Screening mammogram in one year. (Code:SM-B-01Y) BI-RADS  CATEGORY  1: Negative. Electronically Signed   By: Claudie Revering M.D.   On: 12/08/2017 13:46       Assessment & Plan:   Problem List Items Addressed This Visit    Abnormal liver function    Previous abdominal ultrasound - negative.  Follow liver function tests.        Erythrocytosis    Being followed by hematology.  Diagnosed recently with sleep apnea.  Has f/u planned next week.        Essential hypertension, benign    Blood pressure under good control.  Continue same medication regimen.  Follow pressures.  Follow metabolic panel.        Relevant Medications   lisinopril (PRINIVIL,ZESTRIL) 30 MG tablet   rosuvastatin (CRESTOR) 5 MG tablet   Other Relevant Orders   Basic metabolic panel   Hyperglycemia    Low carb diet and exercise.  Follow met b and a1c.        Relevant Orders   Hemoglobin A1c   Leukopenia    Follow cbc.       Pure hypercholesterolemia    On crestor.  Low cholesterol diet and exercise.  Follow lipid panel and liver function tests.        Relevant Medications   lisinopril (PRINIVIL,ZESTRIL) 30 MG tablet   rosuvastatin (CRESTOR) 5 MG tablet   Other Relevant Orders   Hepatic function panel   Lipid panel   Sleep apnea    Diagnosed recently.  Planning to f/u with [place] for CPAP.           [name], MD

## 2018-01-24 ENCOUNTER — Encounter: Payer: Self-pay | Admitting: Internal Medicine

## 2018-01-24 DIAGNOSIS — G4730 Sleep apnea, unspecified: Secondary | ICD-10-CM | POA: Insufficient documentation

## 2018-01-24 DIAGNOSIS — D751 Secondary polycythemia: Secondary | ICD-10-CM | POA: Insufficient documentation

## 2018-01-24 NOTE — Assessment & Plan Note (Signed)
Being followed by hematology.  Diagnosed recently with sleep apnea.  Has f/u planned next week.

## 2018-01-24 NOTE — Assessment & Plan Note (Signed)
Low carb diet and exercise.  Follow met b and a1c.   

## 2018-01-24 NOTE — Assessment & Plan Note (Signed)
Previous abdominal ultrasound negative.  Follow liver function tests.   

## 2018-01-24 NOTE — Assessment & Plan Note (Signed)
Follow cbc.  

## 2018-01-24 NOTE — Assessment & Plan Note (Signed)
On crestor.  Low cholesterol diet and exercise.  Follow lipid panel and liver function tests.   

## 2018-01-24 NOTE — Assessment & Plan Note (Signed)
Diagnosed recently.  Planning to f/u with [place] for CPAP.

## 2018-01-24 NOTE — Assessment & Plan Note (Signed)
Blood pressure under good control.  Continue same medication regimen.  Follow pressures.  Follow metabolic panel.   

## 2018-01-25 ENCOUNTER — Inpatient Hospital Stay (HOSPITAL_BASED_OUTPATIENT_CLINIC_OR_DEPARTMENT_OTHER): Payer: BLUE CROSS/BLUE SHIELD | Admitting: Oncology

## 2018-01-25 ENCOUNTER — Other Ambulatory Visit: Payer: Self-pay

## 2018-01-25 ENCOUNTER — Encounter: Payer: Self-pay | Admitting: Oncology

## 2018-01-25 ENCOUNTER — Inpatient Hospital Stay: Payer: BLUE CROSS/BLUE SHIELD | Attending: Oncology

## 2018-01-25 ENCOUNTER — Inpatient Hospital Stay: Payer: BLUE CROSS/BLUE SHIELD

## 2018-01-25 VITALS — BP 145/81 | HR 77 | Temp 96.8°F | Resp 18 | Wt 151.9 lb

## 2018-01-25 DIAGNOSIS — Z79899 Other long term (current) drug therapy: Secondary | ICD-10-CM

## 2018-01-25 DIAGNOSIS — Z7982 Long term (current) use of aspirin: Secondary | ICD-10-CM

## 2018-01-25 DIAGNOSIS — Z87891 Personal history of nicotine dependence: Secondary | ICD-10-CM | POA: Insufficient documentation

## 2018-01-25 DIAGNOSIS — G4733 Obstructive sleep apnea (adult) (pediatric): Secondary | ICD-10-CM | POA: Diagnosis not present

## 2018-01-25 DIAGNOSIS — I10 Essential (primary) hypertension: Secondary | ICD-10-CM

## 2018-01-25 DIAGNOSIS — D751 Secondary polycythemia: Secondary | ICD-10-CM | POA: Diagnosis not present

## 2018-01-25 DIAGNOSIS — E785 Hyperlipidemia, unspecified: Secondary | ICD-10-CM

## 2018-01-25 DIAGNOSIS — Z85038 Personal history of other malignant neoplasm of large intestine: Secondary | ICD-10-CM | POA: Diagnosis not present

## 2018-01-25 DIAGNOSIS — Z9049 Acquired absence of other specified parts of digestive tract: Secondary | ICD-10-CM | POA: Diagnosis not present

## 2018-01-25 LAB — CBC WITH DIFFERENTIAL/PLATELET
Basophils Absolute: 0 10*3/uL (ref 0–0.1)
Basophils Relative: 1 %
Eosinophils Absolute: 0.1 10*3/uL (ref 0–0.7)
Eosinophils Relative: 3 %
HCT: 45.4 % (ref 35.0–47.0)
Hemoglobin: 15.6 g/dL (ref 12.0–16.0)
Lymphocytes Relative: 39 %
Lymphs Abs: 1.3 10*3/uL (ref 1.0–3.6)
MCH: 29.9 pg (ref 26.0–34.0)
MCHC: 34.4 g/dL (ref 32.0–36.0)
MCV: 86.7 fL (ref 80.0–100.0)
Monocytes Absolute: 0.2 10*3/uL (ref 0.2–0.9)
Monocytes Relative: 6 %
Neutro Abs: 1.8 10*3/uL (ref 1.4–6.5)
Neutrophils Relative %: 51 %
Platelets: 202 10*3/uL (ref 150–440)
RBC: 5.23 MIL/uL — ABNORMAL HIGH (ref 3.80–5.20)
RDW: 13.4 % (ref 11.5–14.5)
WBC: 3.5 10*3/uL — ABNORMAL LOW (ref 3.6–11.0)

## 2018-01-25 NOTE — Progress Notes (Signed)
Hematology/Oncology Consult note [facility] Telephone:([phone] [phone]   Patient Care Team: [name], MD as PCP - General (Internal Medicine)  REFERRING PROVIDER: [name], MD  REASON FOR VISIT Follow up for treatment of erythrocytosis :   HISTORY OF PRESENTING ILLNESS:  Susan Weeks is a  67 y.o.  female with PMH listed below who was referred to me for evaluation of elevated hemoglobin.  Recent hemoglobin 16.2, hct 47.7, reviewed her previous labs, hemoglobin has been chronically high since 2017, ranging from 15.1 to 16.2.   Pertinent Oncology Histroy  Previous history of carcinoma of colon status post resection, has an intermittent rising CEA.  Status post resection, no evidence of malignancy. Patient had exploratory laparotomy because of abnormal PET scan following rising CEA and was negative for any malignancy. CEA  remains stable in the range of 5.0   Patient reports feeling well at baseline. She was former smoker. No second hand smoking.  Denies any family history of high hemoglobin, DVT. No personal history of DVT  INTERVAL HISTORY Susan Weeks is a 67 y.o. female who has above history reviewed by me today presents for follow-up visit for management of erythrocytosis. She has been tested negative for Jak 2 mutation with reflex, BCR ABL mutation negative. He was referred to see Dr. Felicie Morn for evaluation of sleep apnea.  She was seen by pulmonology during the interval and had a sleep study. She did have sleep apnea and she is in the process of getting CPAP machine at home. Otherwise she continues to feel well.  No new complaints.  Review of Systems  Constitutional: Negative for chills, fever, malaise/fatigue and weight loss.  HENT: Negative for congestion, ear discharge, ear pain, nosebleeds, sinus pain and sore throat.   Eyes: Negative for double vision, photophobia, pain, discharge and redness.    Respiratory: Negative for cough, hemoptysis, sputum production, shortness of breath and wheezing.   Cardiovascular: Negative for chest pain, palpitations, orthopnea, claudication and leg swelling.  Gastrointestinal: Negative for abdominal pain, blood in stool, constipation, diarrhea, heartburn, melena, nausea and vomiting.  Genitourinary: Negative for dysuria, flank pain, frequency and hematuria.  Musculoskeletal: Negative for back pain, myalgias and neck pain.  Skin: Negative for itching and rash.  Neurological: Negative for dizziness, tingling, tremors, focal weakness, weakness and headaches.  Endo/Heme/Allergies: Negative for environmental allergies. Does not bruise/bleed easily.  Psychiatric/Behavioral: Negative for depression and hallucinations. The patient is not nervous/anxious.     MEDICAL HISTORY:  Past Medical History:  Diagnosis Date  . Abnormal liver function   . Colon cancer ([place])    adenocarcinoma of the cecum s/p partial colon resection  . History of colon polyps   . Hyperlipidemia   . Hypertension     SURGICAL HISTORY: Past Surgical History:  Procedure Laterality Date  . ABDOMINAL HYSTERECTOMY  1984   secondary to bleeding and fibroid tumors  . APPENDECTOMY    . COLONOSCOPY W/ POLYPECTOMY  2001  . COLONOSCOPY WITH PROPOFOL N/A 07/28/2016   Procedure: COLONOSCOPY WITH PROPOFOL;  Surgeon: [name], MD;  Location: [facility] ENDOSCOPY;  Service: Endoscopy;  Laterality: N/A;    SOCIAL HISTORY: Social History   Socioeconomic History  . Marital status: Married    Spouse name: [name]  . Number of children: 1  . Years of education: HS  . Highest education level: Not on file  Occupational History    Employer: OTHER    Comment: [employer]  Social Needs  . Financial  resource strain: Not on file  . Food insecurity:    Worry: Not on file    Inability: Not on file  . Transportation needs:    Medical: Not on file    Non-medical: Not on file  Tobacco Use   . Smoking status: Former Research scientist (life sciences)  . Smokeless tobacco: Never Used  Substance and Sexual Activity  . Alcohol use: No    Alcohol/week: 0.0 standard drinks  . Drug use: No  . Sexual activity: Not on file  Lifestyle  . Physical activity:    Days per week: Not on file    Minutes per session: Not on file  . Stress: Not on file  Relationships  . Social connections:    Talks on phone: Not on file    Gets together: Not on file    Attends religious service: Not on file    Active member of club or organization: Not on file    Attends meetings of clubs or organizations: Not on file    Relationship status: Not on file  . Intimate partner violence:    Fear of current or ex partner: Not on file    Emotionally abused: Not on file    Physically abused: Not on file    Forced sexual activity: Not on file  Other Topics Concern  . Not on file  Social History Narrative  . Not on file    FAMILY HISTORY: Family History  Problem Relation Age of Onset  . Stroke Mother   . Hypertension Mother   . Cancer Father        Prostate   . Heart disease Brother        myocardial infarction  . Hypertension Brother   . Hypertension Sister        x4  . Breast cancer Neg Hx     ALLERGIES:  is allergic to z-pak [azithromycin].  MEDICATIONS:  Current Outpatient Medications  Medication Sig Dispense Refill  . amLODipine (NORVASC) 5 MG tablet TAKE 1 TABLET BY MOUTH ONCE DAILY 90 tablet 1  . aspirin EC 81 MG tablet Take 1 tablet (81 mg total) by mouth daily. 90 tablet 3  . calcium carbonate (OS-CAL) 600 MG TABS Take 600 mg by mouth daily.     [name] lisinopril (PRINIVIL,ZESTRIL) 30 MG tablet Take 1 tablet (30 mg total) by mouth daily. 90 tablet 1  . rosuvastatin (CRESTOR) 5 MG tablet TAKE ONE TABLET BY MOUTH daily 90 tablet 1  . vitamin E 400 UNIT capsule Take 400 Units by mouth daily.      No current facility-administered medications for this visit.      PHYSICAL EXAMINATION: ECOG PERFORMANCE STATUS: 0  - Asymptomatic Vitals:   01/25/18 0914  BP: (!) 145/81  Pulse: 77  Resp: 18  Temp: (!) 96.8 F (36 C)   Filed Weights   01/25/18 0914  Weight: 151 lb 14.4 oz (68.9 kg)    Physical Exam  Constitutional: She is oriented to person, place, and time. She appears well-developed and well-nourished. No distress.  HENT:  Head: Normocephalic and atraumatic.  Right Ear: External ear normal.  Left Ear: External ear normal.  Mouth/Throat: Oropharynx is clear and moist.  Eyes: Pupils are equal, round, and reactive to light. Conjunctivae and EOM are normal. No scleral icterus.  Neck: Normal range of motion. Neck supple.  Cardiovascular: Normal rate, regular rhythm and normal heart sounds.  Pulmonary/Chest: Effort normal and breath sounds normal. No respiratory distress. She has no wheezes. She  has no rales. She exhibits no tenderness.  Abdominal: Soft. Bowel sounds are normal. She exhibits no distension and no mass. There is no tenderness.  Musculoskeletal: Normal range of motion. She exhibits no edema or deformity.  Lymphadenopathy:    She has no cervical adenopathy.  Neurological: She is alert and oriented to person, place, and time. No cranial nerve deficit. Coordination normal.  Skin: Skin is warm and dry. No rash noted.  Psychiatric: She has a normal mood and affect. Her behavior is normal. Thought content normal.     LABORATORY DATA:  I have reviewed the data as listed Lab Results  Component Value Date   WBC 3.5 (L) 01/25/2018   HGB 15.6 01/25/2018   HCT 45.4 01/25/2018   MCV 86.7 01/25/2018   PLT 202 01/25/2018   Recent Labs    05/13/17 0755 09/17/17 0859  NA 137 139  K 4.8 4.6  CL 101 103  CO2 30 31  GLUCOSE 104* 110*  BUN 18 20  CREATININE 0.84 0.83  CALCIUM 9.4 9.4  PROT 7.1 7.2  ALBUMIN 4.5 4.2  AST 19 21  ALT 30 28  ALKPHOS 57 52  BILITOT 1.0 0.7  BILIDIR 0.2 0.1       ASSESSMENT & PLAN:  1. Erythrocytosis   2. History of colon cancer    #Labs  reviewed and discussed with patient.  Mild erythrocytosis revolved.  Hemoglobin at 15.6, high normal limits. Discussed with patient that her erythrocytosis is likely secondary to sleep apnea.  Once her sleep apnea is being treated with CPAP at night, anticipate her erythrocytosis will improve. She does not need any phlebotomy. Continue aspirin 81 mg daily.  #History of colon cancer, will repeat CBC, CMP CEA at next visit.  She is asymptomatic.   Orders Placed This Encounter  Procedures  . CBC with Differential/Platelet    Standing Status:   Future    Standing Expiration Date:   01/26/2019  . Comprehensive metabolic panel    Standing Status:   Future    Standing Expiration Date:   01/26/2019  . CEA    Standing Status:   Future    Standing Expiration Date:   01/26/2019    Return of visit: May 2020.   Total face to face encounter time for this patient visit was 15 min. >50% of the time was  spent in counseling and coordination of care.  Earlie Server, MD, PhD Hematology Oncology [facility] at [facility] Pager- [phone] 01/25/2018

## 2018-01-25 NOTE — Progress Notes (Signed)
Patient here for follow up. No concerns voiced.  °

## 2018-01-29 ENCOUNTER — Telehealth: Payer: Self-pay | Admitting: Internal Medicine

## 2018-01-29 DIAGNOSIS — G4733 Obstructive sleep apnea (adult) (pediatric): Secondary | ICD-10-CM

## 2018-01-29 NOTE — Addendum Note (Signed)
Addended by: Stephanie Coup on: 01/29/2018 04:35 PM   Modules accepted: Orders

## 2018-01-29 NOTE — Telephone Encounter (Signed)
Patient calling to scheduled follow up Patient would like to speak with nurse in regards to CPAP masks Would like to discuss what options are avaialble Please call to discuss

## 2018-01-29 NOTE — Telephone Encounter (Signed)
LMTCB   Pt has not worn cpap once out of 30 days according to airview.

## 2018-01-29 NOTE — Telephone Encounter (Signed)
Spoke with patient's spouse [name]. Explained pts insurance will not cover another mask until she has tried mask she has for at least 30 day. If patient prefers she can purchase a mask out of pocket. He will relay message to patient. Nothing further needed.

## 2018-01-29 NOTE — Telephone Encounter (Signed)
Pt is having a hard time with mask. Pt is working with [facility] on getting a different mask. She was giving some rubber bands and tried but once she laid down and rubber bands came off. Pt started cpap on 01/25/18 she was given a nasal mask, then full face mask that strapped over her head. She went back on Tuesday and was given chaps and is still not able to wear. Mask fit order entered per patient request.

## 2018-02-01 NOTE — Telephone Encounter (Signed)
LM on VM for patient that order has been entered for mask fit and we will call her to schedule the mask fit apt.

## 2018-02-02 ENCOUNTER — Other Ambulatory Visit (INDEPENDENT_AMBULATORY_CARE_PROVIDER_SITE_OTHER): Payer: BLUE CROSS/BLUE SHIELD

## 2018-02-02 DIAGNOSIS — I10 Essential (primary) hypertension: Secondary | ICD-10-CM

## 2018-02-02 DIAGNOSIS — R739 Hyperglycemia, unspecified: Secondary | ICD-10-CM

## 2018-02-02 DIAGNOSIS — E7800 Pure hypercholesterolemia, unspecified: Secondary | ICD-10-CM | POA: Diagnosis not present

## 2018-02-02 LAB — LIPID PANEL
Cholesterol: 146 mg/dL (ref 0–200)
HDL: 58.9 mg/dL (ref >39.00)
LDL Cholesterol: 77 mg/dL (ref 0–99)
NonHDL: 87.07
Total CHOL/HDL Ratio: 2
Triglycerides: 50 mg/dL (ref 0.0–149.0)
VLDL: 10 mg/dL (ref 0.0–40.0)

## 2018-02-02 LAB — HEPATIC FUNCTION PANEL
ALT: 20 U/L (ref 0–35)
AST: 19 U/L (ref 0–37)
Albumin: 4.3 g/dL (ref 3.5–5.2)
Alkaline Phosphatase: 49 U/L (ref 39–117)
Bilirubin, Direct: 0.1 mg/dL (ref 0.0–0.3)
Total Bilirubin: 0.7 mg/dL (ref 0.2–1.2)
Total Protein: 7 g/dL (ref 6.0–8.3)

## 2018-02-02 LAB — BASIC METABOLIC PANEL
BUN: 18 mg/dL (ref 6–23)
CO2: 30 mEq/L (ref 19–32)
Calcium: 9.3 mg/dL (ref 8.4–10.5)
Chloride: 102 mEq/L (ref 96–112)
Creatinine, Ser: 0.86 mg/dL (ref 0.40–1.20)
GFR: 84.33 mL/min (ref >60.00)
Glucose, Bld: 96 mg/dL (ref 70–99)
Potassium: 4.7 mEq/L (ref 3.5–5.1)
Sodium: 137 mEq/L (ref 135–145)

## 2018-02-02 LAB — HEMOGLOBIN A1C: Hgb A1c MFr Bld: 5.6 % (ref 4.6–6.5)

## 2018-02-03 ENCOUNTER — Encounter: Payer: Self-pay | Admitting: *Deleted

## 2018-02-08 ENCOUNTER — Ambulatory Visit (HOSPITAL_BASED_OUTPATIENT_CLINIC_OR_DEPARTMENT_OTHER): Payer: BLUE CROSS/BLUE SHIELD | Attending: Internal Medicine | Admitting: *Deleted

## 2018-02-08 DIAGNOSIS — G4733 Obstructive sleep apnea (adult) (pediatric): Secondary | ICD-10-CM

## 2018-02-16 ENCOUNTER — Telehealth: Payer: Self-pay | Admitting: *Deleted

## 2018-02-16 NOTE — Telephone Encounter (Signed)
Advised patient to continue trying to wear cpap nightly. Nothing further needed.

## 2018-02-16 NOTE — Telephone Encounter (Signed)
Checked compliance patient is scheduled for f/u on 10/28. Pt went for mask fit and was fitted with Dreamware device. She has been wearing cpap since 10/7 and missed 10/12. Called patient to make aware she must wear cpap nightly in order for insurance to cover.

## 2018-02-16 NOTE — Telephone Encounter (Signed)
Patient is returning your call.  

## 2018-02-23 ENCOUNTER — Encounter: Payer: Self-pay | Admitting: Internal Medicine

## 2018-03-01 ENCOUNTER — Ambulatory Visit (INDEPENDENT_AMBULATORY_CARE_PROVIDER_SITE_OTHER): Payer: BLUE CROSS/BLUE SHIELD | Admitting: Internal Medicine

## 2018-03-01 ENCOUNTER — Encounter: Payer: Self-pay | Admitting: Internal Medicine

## 2018-03-01 VITALS — BP 126/80 | HR 75 | Resp 16 | Ht 63.0 in | Wt 154.0 lb

## 2018-03-01 DIAGNOSIS — G4733 Obstructive sleep apnea (adult) (pediatric): Secondary | ICD-10-CM

## 2018-03-01 NOTE — Patient Instructions (Signed)
Use CPAP for the entire night, every night.

## 2018-03-01 NOTE — Progress Notes (Signed)
[facility] Consultation      Assessment and Plan:  Obstructive sleep apnea. --Sleep apnea with AHI of 8.  Started on CPAP 5-15. --She is still getting used to CPAP, took some time to get a comfortable mask, but now she is doing better.  --Encouraged to use CPAP every night, for the entire night.   Essential hypertension, Erythrocytosis.  --OSA can contributory to both above conditions, therefore treatment with CPAP is important.    Return in about 3 months (around 06/01/2018).   Date: 03/01/2018  MRN# [id number] Susan Weeks March 09, 1951   Susan Weeks is a 67 y.o. old female seen in consultation for chief complaint of:    Chief Complaint  Patient presents with  . Sleep Apnea    pt is working with cpap. She was having issues with her mask.    HPI:  The patient is a 67 year old female with a history of colon cancer status post resection.  She was noted by oncology to have erythrocytosis, she was therefore referred for work-up for sleep apnea. She was found to have mild OSA, she is still getting used to the CPAP. She was started on an intial face mask but she did not tolerate it. She then went for a mask fitting hybrid mask which seems to work much better.  She is more awake during the day.   **CPAP download 01/29/18-02/27/18>> Raw data personally reviewed, Usage > 4 hrs is 18/19 days.  avg usage on days used 5 hours 22 min. Auto-pressure is 5-15.  Median pressure is 7.1; 95th percentile pressure is 10, maximum pressure is 12. Residual AHI is 4.7. Overall this shows excellent control of OSA.  **HST 12/28/2017>> mild sleep apnea with AHI of 8.5.  Recommended auto CPAP with pressure range 5-15. **Hemoglobin 09/17/2017>> 16.2. **Absolute eosinophil count 09/17/2017>> 100.  Medication:    Current Outpatient Medications:  .  amLODipine (NORVASC) 5 MG tablet, TAKE 1 TABLET BY MOUTH ONCE DAILY, Disp: 90 tablet, Rfl: 1 .  aspirin EC 81 MG tablet, Take 1 tablet (81  mg total) by mouth daily., Disp: 90 tablet, Rfl: 3 .  calcium carbonate (OS-CAL) 600 MG TABS, Take 600 mg by mouth daily. , Disp: , Rfl:  .  lisinopril (PRINIVIL,ZESTRIL) 30 MG tablet, Take 1 tablet (30 mg total) by mouth daily., Disp: 90 tablet, Rfl: 1 .  rosuvastatin (CRESTOR) 5 MG tablet, TAKE ONE TABLET BY MOUTH daily, Disp: 90 tablet, Rfl: 1 .  vitamin E 400 UNIT capsule, Take 400 Units by mouth daily. , Disp: , Rfl:    Allergies:  Z-pak [azithromycin]   Review of Systems:  Constitutional: Feels well. Cardiovascular: Denies chest pain, exertional chest pain.  Pulmonary: Denies hemoptysis, pleuritic chest pain.   The remainder of systems were reviewed and were found to be negative other than what is documented in the HPI.    Physical Examination:   VS: BP 126/80 (BP Location: Left Arm, Cuff Size: Normal)   Pulse 75   Resp 16   Ht 5\' 3"  (1.6 m)   Wt 154 lb (69.9 kg)   LMP 06/28/1982   SpO2 99%   BMI 27.28 kg/m   General Appearance: No distress  Neuro:without focal findings, mental status, speech normal, alert and oriented HEENT: PERRLA, EOM intact Pulmonary: No wheezing, No rales  CardiovascularNormal S1,S2.  No m/r/g.  Abdomen: Benign, Soft, non-tender, No masses Renal:  No costovertebral tenderness  GU:  No performed at this time. Endoc: No evident  thyromegaly, no signs of acromegaly or Cushing features Skin:   warm, no rashes, no ecchymosis  Extremities: normal, no cyanosis, clubbing.      LABORATORY PANEL:   CBC No results for input(s): WBC, HGB, HCT, PLT in the last 168 hours. ------------------------------------------------------------------------------------------------------------------  Chemistries  No results for input(s): NA, K, CL, CO2, GLUCOSE, BUN, CREATININE, CALCIUM, MG, AST, ALT, ALKPHOS, BILITOT in the last 168 hours.  Invalid input(s):  GFRCGP ------------------------------------------------------------------------------------------------------------------  Cardiac Enzymes No results for input(s): TROPONINI in the last 168 hours. ------------------------------------------------------------  RADIOLOGY:  No results found.     Thank  you for the consultation and for allowing [facility] to assist in the care of your patient. Our recommendations are noted above.  Please contact us if we can be of further service.   Marda Stalker, M.D., F.C.C.P.  Board Certified in Internal Medicine, Pulmonary Medicine, [place], and Sleep Medicine.  [facility] Office Number: [phone]   03/01/2018

## 2018-03-03 DIAGNOSIS — G4733 Obstructive sleep apnea (adult) (pediatric): Secondary | ICD-10-CM | POA: Diagnosis not present

## 2018-04-05 ENCOUNTER — Ambulatory Visit (INDEPENDENT_AMBULATORY_CARE_PROVIDER_SITE_OTHER): Payer: BLUE CROSS/BLUE SHIELD | Admitting: *Deleted

## 2018-04-05 DIAGNOSIS — Z23 Encounter for immunization: Secondary | ICD-10-CM | POA: Diagnosis not present

## 2018-05-19 DIAGNOSIS — H6123 Impacted cerumen, bilateral: Secondary | ICD-10-CM | POA: Diagnosis not present

## 2018-05-25 ENCOUNTER — Ambulatory Visit (INDEPENDENT_AMBULATORY_CARE_PROVIDER_SITE_OTHER): Payer: BLUE CROSS/BLUE SHIELD | Admitting: Internal Medicine

## 2018-05-25 ENCOUNTER — Encounter: Payer: Self-pay | Admitting: Internal Medicine

## 2018-05-25 DIAGNOSIS — R945 Abnormal results of liver function studies: Secondary | ICD-10-CM | POA: Diagnosis not present

## 2018-05-25 DIAGNOSIS — G4730 Sleep apnea, unspecified: Secondary | ICD-10-CM

## 2018-05-25 DIAGNOSIS — Z85038 Personal history of other malignant neoplasm of large intestine: Secondary | ICD-10-CM

## 2018-05-25 DIAGNOSIS — D751 Secondary polycythemia: Secondary | ICD-10-CM

## 2018-05-25 DIAGNOSIS — I10 Essential (primary) hypertension: Secondary | ICD-10-CM

## 2018-05-25 DIAGNOSIS — R739 Hyperglycemia, unspecified: Secondary | ICD-10-CM

## 2018-05-25 DIAGNOSIS — D72819 Decreased white blood cell count, unspecified: Secondary | ICD-10-CM

## 2018-05-25 DIAGNOSIS — E7800 Pure hypercholesterolemia, unspecified: Secondary | ICD-10-CM

## 2018-05-25 NOTE — Progress Notes (Signed)
[name]: Susan Weeks, female   DOB: [birth year], 68 y.o.   MRN: [id number]   Subjective:    [name]: Susan Weeks, female    DOB: 19-Feb-1951, 68 y.o.   MRN: [id number]  HPI  Patient here for a scheduled follow up.  She reports she is doing relatively well.  Diagnosed with sleep apnea.  Recommend:  CPAP.  Saw Dr Tasia Catchings for erythrocytosis.  Recommend treating sleep apnea and asa '81mg'$  q day.  Has f/u 09/2018.  Weight is up.  Discussed diet and exercise.  No chest pain.  No sob.  No acid reflux.  No abdominal pain.  Bowels moving.  No urine change.      Past Medical History:  Diagnosis Date  . Abnormal liver function   . Colon cancer ([place])    adenocarcinoma of the cecum s/p partial colon resection  . History of colon polyps   . Hyperlipidemia   . Hypertension    Past Surgical History:  Procedure Laterality Date  . ABDOMINAL HYSTERECTOMY  1984   secondary to bleeding and fibroid tumors  . APPENDECTOMY    . COLONOSCOPY W/ POLYPECTOMY  2001  . COLONOSCOPY WITH PROPOFOL N/A 07/28/2016   Procedure: COLONOSCOPY WITH PROPOFOL;  Surgeon: [name], MD;  Location: [facility] ENDOSCOPY;  Service: Endoscopy;  Laterality: N/A;   Family History  Problem Relation Age of Onset  . Stroke Mother   . Hypertension Mother   . Cancer Father        Prostate   . Heart disease Brother        myocardial infarction  . Hypertension Brother   . Hypertension Sister        x4  . Breast cancer Neg Hx    Social History   Socioeconomic History  . Marital status: Married    Spouse name: [name]  . Number of children: 1  . Years of education: HS  . Highest education level: Not on file  Occupational History    Employer: OTHER    Comment: [employer]  Social Needs  . Financial resource strain: Not on file  . Food insecurity:    Worry: Not on file    Inability: Not on file  . Transportation needs:    Medical: Not on file    Non-medical: Not on file  Tobacco Use  . Smoking status: Former  Research scientist (life sciences)  . Smokeless tobacco: Never Used  Substance and Sexual Activity  . Alcohol use: No    Alcohol/week: 0.0 standard drinks  . Drug use: No  . Sexual activity: Not on file  Lifestyle  . Physical activity:    Days per week: Not on file    Minutes per session: Not on file  . Stress: Not on file  Relationships  . Social connections:    Talks on phone: Not on file    Gets together: Not on file    Attends religious service: Not on file    Active member of club or organization: Not on file    Attends meetings of clubs or organizations: Not on file    Relationship status: Not on file  Other Topics Concern  . Not on file  Social History Narrative  . Not on file    Outpatient Encounter Medications as of 05/25/2018  Medication Sig  . amLODipine (NORVASC) 5 MG tablet TAKE 1 TABLET BY MOUTH ONCE DAILY  . aspirin EC 81 MG tablet Take 1 tablet (81 mg total) by mouth daily.  [name]  calcium carbonate (OS-CAL) 600 MG TABS Take 600 mg by mouth daily.   [name] lisinopril (PRINIVIL,ZESTRIL) 30 MG tablet Take 1 tablet (30 mg total) by mouth daily.  . rosuvastatin (CRESTOR) 5 MG tablet TAKE ONE TABLET BY MOUTH daily  . vitamin E 400 UNIT capsule Take 400 Units by mouth daily.    No facility-administered encounter medications on file as of 05/25/2018.     Review of Systems  Constitutional: Negative for appetite change and unexpected weight change.  HENT: Negative for congestion and sinus pressure.   Respiratory: Negative for cough, chest tightness and shortness of breath.   Cardiovascular: Negative for chest pain, palpitations and leg swelling.  Gastrointestinal: Negative for abdominal pain, diarrhea, nausea and vomiting.  Genitourinary: Negative for difficulty urinating and dysuria.  Musculoskeletal: Negative for joint swelling and myalgias.  Skin: Negative for color change and rash.  Neurological: Negative for dizziness, light-headedness and headaches.  Psychiatric/Behavioral: Negative for  agitation and dysphoric mood.       Objective:    Physical Exam Constitutional:      General: She is not in acute distress.    Appearance: Normal appearance.  HENT:     Nose: Nose normal. No congestion.     Mouth/Throat:     Pharynx: No oropharyngeal exudate or posterior oropharyngeal erythema.  Neck:     Musculoskeletal: Neck supple. No muscular tenderness.     Thyroid: No thyromegaly.  Cardiovascular:     Rate and Rhythm: Normal rate and regular rhythm.  Pulmonary:     Effort: No respiratory distress.     Breath sounds: Normal breath sounds. No wheezing.  Abdominal:     General: Bowel sounds are normal.     Palpations: Abdomen is soft.     Tenderness: There is no abdominal tenderness.  Musculoskeletal:        General: No swelling or tenderness.  Lymphadenopathy:     Cervical: No cervical adenopathy.  Skin:    Findings: No erythema or rash.  Neurological:     Mental Status: She is alert.  Psychiatric:        Mood and Affect: Mood normal.        Behavior: Behavior normal.     BP 128/72 (BP Location: Left Arm, Patient Position: Sitting, Cuff Size: Normal)   Pulse 78   Temp 98.1 F (36.7 C) (Oral)   Resp 16   Wt 158 lb 3.2 oz (71.8 kg)   LMP 06/28/1982   SpO2 98%   BMI 28.02 kg/m  Wt Readings from Last 3 Encounters:  05/25/18 158 lb 3.2 oz (71.8 kg)  03/01/18 154 lb (69.9 kg)  01/25/18 151 lb 14.4 oz (68.9 kg)     Lab Results  Component Value Date   WBC 3.5 (L) 01/25/2018   HGB 15.6 01/25/2018   HCT 45.4 01/25/2018   PLT 202 01/25/2018   GLUCOSE 96 02/02/2018   CHOL 146 02/02/2018   TRIG 50.0 02/02/2018   HDL 58.90 02/02/2018   LDLDIRECT 150.8 03/02/2013   LDLCALC 77 02/02/2018   ALT 20 02/02/2018   AST 19 02/02/2018   NA 137 02/02/2018   K 4.7 02/02/2018   CL 102 02/02/2018   CREATININE 0.86 02/02/2018   BUN 18 02/02/2018   CO2 30 02/02/2018   TSH 1.02 05/13/2017   HGBA1C 5.6 02/02/2018    Mm 3d Screen Breast Bilateral  Result Date:  12/08/2017 CLINICAL DATA:  Screening. EXAM: DIGITAL SCREENING BILATERAL MAMMOGRAM WITH TOMO AND CAD COMPARISON:  Previous  exam(s). ACR Breast Density Category b: There are scattered areas of fibroglandular density. FINDINGS: There are no findings suspicious for malignancy. Images were processed with CAD. IMPRESSION: No mammographic evidence of malignancy. A result letter of this screening mammogram will be mailed directly to the patient. RECOMMENDATION: Screening mammogram in one year. (Code:SM-B-01Y) BI-RADS CATEGORY  1: Negative. Electronically Signed   By: Claudie Revering M.D.   On: 12/08/2017 13:46       Assessment & Plan:   Problem List Items Addressed This Visit    Abnormal liver function    Previous abdominal ultrasound - negative.  Follow liver function tests.        Erythrocytosis    Being followed by hematology.  Diagnosed with sleep apnea.  Treat sleep apnea.  Continue f/u with hematology.  Continue aspirin 71m q day.        Essential hypertension, benign    Blood pressure under good control.  Continue same medication regimen.  Follow pressures.  Follow metabolic panel.        Relevant Orders   TSH   Basic metabolic panel   History of colon cancer    Colonoscopy 07/2016.  Recommended f/u colonoscopy in 3 years.        Hyperglycemia    Low carb diet and exercise.  Follow met b and a1c.        Relevant Orders   Hemoglobin A1c   Leukopenia    Follow cbc.       Pure hypercholesterolemia    On crestor.  Low cholesterol diet and exercise.  Follow lipid panel and liver function tests.        Relevant Orders   Hepatic function panel   Lipid panel   Sleep apnea    Continue cpap.            CEinar Pheasant MD

## 2018-05-30 ENCOUNTER — Encounter: Payer: Self-pay | Admitting: Internal Medicine

## 2018-05-31 ENCOUNTER — Encounter: Payer: Self-pay | Admitting: Internal Medicine

## 2018-05-31 NOTE — Assessment & Plan Note (Signed)
Continue cpap.  

## 2018-05-31 NOTE — Assessment & Plan Note (Signed)
Blood pressure under good control.  Continue same medication regimen.  Follow pressures.  Follow metabolic panel.   

## 2018-05-31 NOTE — Assessment & Plan Note (Signed)
On crestor.  Low cholesterol diet and exercise.  Follow lipid panel and liver function tests.   

## 2018-05-31 NOTE — Assessment & Plan Note (Signed)
Colonoscopy 07/2016.  Recommended f/u colonoscopy in 3 years.

## 2018-05-31 NOTE — Assessment & Plan Note (Signed)
Follow cbc.  

## 2018-05-31 NOTE — Assessment & Plan Note (Signed)
Low carb diet and exercise.  Follow met b and a1c.   

## 2018-05-31 NOTE — Assessment & Plan Note (Signed)
Being followed by hematology.  Diagnosed with sleep apnea.  Treat sleep apnea.  Continue f/u with hematology.  Continue aspirin 81mg  q day.

## 2018-05-31 NOTE — Assessment & Plan Note (Signed)
Previous abdominal ultrasound negative.  Follow liver function tests.   

## 2018-06-01 ENCOUNTER — Encounter: Payer: Self-pay | Admitting: Internal Medicine

## 2018-06-01 ENCOUNTER — Ambulatory Visit (INDEPENDENT_AMBULATORY_CARE_PROVIDER_SITE_OTHER): Payer: BLUE CROSS/BLUE SHIELD | Admitting: Internal Medicine

## 2018-06-01 VITALS — BP 150/92 | HR 85 | Ht 63.0 in | Wt 156.6 lb

## 2018-06-01 DIAGNOSIS — G4733 Obstructive sleep apnea (adult) (pediatric): Secondary | ICD-10-CM

## 2018-06-01 NOTE — Progress Notes (Signed)
[facility] Consultation      Assessment and Plan:  Obstructive sleep apnea. --Sleep apnea with AHI of 8.  Started on CPAP 5-15. Doing better, AHI down to 3.6 --She is now on an oral mask, leak is slightly elevated, asked her to make it a bit tighter.  --Encouraged to continue to use CPAP every night, for the entire night.   Essential hypertension, erythrocytosis. --OSA can contributory to both above conditions, therefore treatment with CPAP is important.    Return in about 1 year (around 06/02/2019).   Date: 06/01/2018  MRN# [id number] Susan Weeks Jun 22, 1950   Susan Weeks is a 68 y.o. old female seen in consultation for chief complaint of:    Chief Complaint  Patient presents with  . Follow-up    F/U for cpap. She states she does not like wearing the cpap but it is working well.     HPI:  The patient is a 68 year old female with a history of colon cancer status post resection.  She was noted by oncology to have erythrocytosis, she was therefore referred for work-up for sleep apnea. She was found to have mild OSA, she is still getting used to the CPAP. She was started on an intial face mask but she did not tolerate it. She then went for a mask fitting hybrid mask which seems to work much better.  She is more awake during the day.   **CPAP download 05/01/2018-05/30/2018>> raw data personally reviewed, uses greater than 4 hours is 30/30 days.  Average usage on days used is 5 hours 47 minutes.  Pressure ranges 5-15.  Median pressure 6, 95th percentile pressure 9, maximum pressure 10, low leaks are elevated on most nights.  Residual AHI is 3.6.  Overall this test shows excellent compliance with CPAP with excellent control of obstructive sleep apnea with elevated leak. **CPAP download 01/29/18-02/27/18>> Raw data personally reviewed, Usage > 4 hrs is 18/19 days.  avg usage on days used 5 hours 22 min. Auto-pressure is 5-15.  Median pressure is 7.1; 95th  percentile pressure is 10, maximum pressure is 12. Residual AHI is 4.7. Overall this shows excellent control of OSA.  **HST 12/28/2017>> mild sleep apnea with AHI of 8.5.  Recommended auto CPAP with pressure range 5-15. **Hemoglobin 09/17/2017>> 16.2. **Absolute eosinophil count 09/17/2017>> 100.  Medication:    Current Outpatient Medications:  .  amLODipine (NORVASC) 5 MG tablet, TAKE 1 TABLET BY MOUTH ONCE DAILY, Disp: 90 tablet, Rfl: 1 .  aspirin EC 81 MG tablet, Take 1 tablet (81 mg total) by mouth daily., Disp: 90 tablet, Rfl: 3 .  calcium carbonate (OS-CAL) 600 MG TABS, Take 600 mg by mouth daily. , Disp: , Rfl:  .  lisinopril (PRINIVIL,ZESTRIL) 30 MG tablet, Take 1 tablet (30 mg total) by mouth daily., Disp: 90 tablet, Rfl: 1 .  rosuvastatin (CRESTOR) 5 MG tablet, TAKE ONE TABLET BY MOUTH daily, Disp: 90 tablet, Rfl: 1 .  vitamin E 400 UNIT capsule, Take 400 Units by mouth daily. , Disp: , Rfl:    Allergies:  Z-pak [azithromycin]    Review of Systems:  Constitutional: Feels well. Cardiovascular: Denies chest pain, exertional chest pain.  Pulmonary: Denies hemoptysis, pleuritic chest pain.   The remainder of systems were reviewed and were found to be negative other than what is documented in the HPI.    Physical Examination:   VS: BP (!) 150/92   Pulse 85   Ht 5\' 3"  (1.6 m)  Wt 156 lb 9.6 oz (71 kg)   LMP 06/28/1982   SpO2 100%   BMI 27.74 kg/m   General Appearance: No distress  Neuro:without focal findings, mental status, speech normal, alert and oriented HEENT: PERRLA, EOM intact Pulmonary: No wheezing, No rales  CardiovascularNormal S1,S2.  No m/r/g.  Abdomen: Benign, Soft, non-tender, No masses Renal:  No costovertebral tenderness  GU:  No performed at this time. Endoc: No evident thyromegaly, no signs of acromegaly or Cushing features Skin:   warm, no rashes, no ecchymosis  Extremities: normal, no cyanosis, clubbing.    LABORATORY PANEL:   CBC No results  for input(s): WBC, HGB, HCT, PLT in the last 168 hours. ------------------------------------------------------------------------------------------------------------------  Chemistries  No results for input(s): NA, K, CL, CO2, GLUCOSE, BUN, CREATININE, CALCIUM, MG, AST, ALT, ALKPHOS, BILITOT in the last 168 hours.  Invalid input(s): GFRCGP ------------------------------------------------------------------------------------------------------------------  Cardiac Enzymes No results for input(s): TROPONINI in the last 168 hours. ------------------------------------------------------------  RADIOLOGY:  No results found.     Thank  you for the consultation and for allowing [facility] to assist in the care of your patient. Our recommendations are noted above.  Please contact us if we can be of further service.   Marda Stalker, M.D., F.C.C.P.  Board Certified in Internal Medicine, Pulmonary Medicine, [place], and Sleep Medicine.  [facility] Office Number: [phone]   06/01/2018

## 2018-06-01 NOTE — Patient Instructions (Signed)
Continue to use cpap every night for the whole night.  Try making your mask just a bit tighter.

## 2018-06-11 ENCOUNTER — Other Ambulatory Visit (INDEPENDENT_AMBULATORY_CARE_PROVIDER_SITE_OTHER): Payer: BLUE CROSS/BLUE SHIELD

## 2018-06-11 DIAGNOSIS — E7800 Pure hypercholesterolemia, unspecified: Secondary | ICD-10-CM

## 2018-06-11 DIAGNOSIS — R739 Hyperglycemia, unspecified: Secondary | ICD-10-CM | POA: Diagnosis not present

## 2018-06-11 DIAGNOSIS — I10 Essential (primary) hypertension: Secondary | ICD-10-CM

## 2018-06-11 LAB — HEPATIC FUNCTION PANEL
ALT: 24 U/L (ref 0–35)
AST: 19 U/L (ref 0–37)
Albumin: 4.5 g/dL (ref 3.5–5.2)
Alkaline Phosphatase: 56 U/L (ref 39–117)
Bilirubin, Direct: 0.1 mg/dL (ref 0.0–0.3)
Total Bilirubin: 0.7 mg/dL (ref 0.2–1.2)
Total Protein: 7.1 g/dL (ref 6.0–8.3)

## 2018-06-11 LAB — LIPID PANEL
Cholesterol: 149 mg/dL (ref 0–200)
HDL: 60.2 mg/dL (ref >39.00)
LDL Cholesterol: 79 mg/dL (ref 0–99)
NonHDL: 88.98
Total CHOL/HDL Ratio: 2
Triglycerides: 48 mg/dL (ref 0.0–149.0)
VLDL: 9.6 mg/dL (ref 0.0–40.0)

## 2018-06-11 LAB — BASIC METABOLIC PANEL
BUN: 21 mg/dL (ref 6–23)
CO2: 29 mEq/L (ref 19–32)
Calcium: 9 mg/dL (ref 8.4–10.5)
Chloride: 103 mEq/L (ref 96–112)
Creatinine, Ser: 0.86 mg/dL (ref 0.40–1.20)
GFR: 79.26 mL/min (ref >60.00)
Glucose, Bld: 86 mg/dL (ref 70–99)
Potassium: 4.1 mEq/L (ref 3.5–5.1)
Sodium: 139 mEq/L (ref 135–145)

## 2018-06-11 LAB — TSH: TSH: 0.82 u[IU]/mL (ref 0.35–4.50)

## 2018-06-11 LAB — HEMOGLOBIN A1C: Hgb A1c MFr Bld: 5.6 % (ref 4.6–6.5)

## 2018-06-17 DIAGNOSIS — G4733 Obstructive sleep apnea (adult) (pediatric): Secondary | ICD-10-CM | POA: Diagnosis not present

## 2018-07-03 ENCOUNTER — Other Ambulatory Visit: Payer: Self-pay | Admitting: Internal Medicine

## 2018-08-10 DIAGNOSIS — G4733 Obstructive sleep apnea (adult) (pediatric): Secondary | ICD-10-CM | POA: Diagnosis not present

## 2018-09-20 ENCOUNTER — Other Ambulatory Visit: Payer: Self-pay | Admitting: Internal Medicine

## 2018-09-20 ENCOUNTER — Telehealth: Payer: Self-pay

## 2018-09-20 DIAGNOSIS — L03039 Cellulitis of unspecified toe: Secondary | ICD-10-CM | POA: Diagnosis not present

## 2018-09-20 NOTE — Telephone Encounter (Signed)
Called pt to advise virtual visit. Unable to leave message

## 2018-09-20 NOTE — Telephone Encounter (Signed)
Schedule this as a virtual and we will do her physical the next visit.  Thanks.

## 2018-09-20 NOTE — Telephone Encounter (Signed)
Copied from [place] [id number]. Topic: Appointment Scheduling - Scheduling Inquiry for Clinic >> Sep 20, 2018 11:44 AM Lennox Solders wrote: Reason for CRM: pt is calling to confirm whether she is having a physical on 09-23-2018. Please call pt on [phone]. I called office 3x no answee

## 2018-09-21 ENCOUNTER — Other Ambulatory Visit: Payer: Self-pay

## 2018-09-23 ENCOUNTER — Other Ambulatory Visit: Payer: Self-pay

## 2018-09-23 ENCOUNTER — Ambulatory Visit (INDEPENDENT_AMBULATORY_CARE_PROVIDER_SITE_OTHER): Payer: BLUE CROSS/BLUE SHIELD | Admitting: Internal Medicine

## 2018-09-23 ENCOUNTER — Encounter: Payer: Self-pay | Admitting: Internal Medicine

## 2018-09-23 VITALS — BP 122/62 | HR 97 | Temp 99.0°F | Ht 63.0 in | Wt 158.2 lb

## 2018-09-23 DIAGNOSIS — D72819 Decreased white blood cell count, unspecified: Secondary | ICD-10-CM

## 2018-09-23 DIAGNOSIS — R945 Abnormal results of liver function studies: Secondary | ICD-10-CM

## 2018-09-23 DIAGNOSIS — R739 Hyperglycemia, unspecified: Secondary | ICD-10-CM

## 2018-09-23 DIAGNOSIS — E7800 Pure hypercholesterolemia, unspecified: Secondary | ICD-10-CM

## 2018-09-23 DIAGNOSIS — L089 Local infection of the skin and subcutaneous tissue, unspecified: Secondary | ICD-10-CM

## 2018-09-23 DIAGNOSIS — G4730 Sleep apnea, unspecified: Secondary | ICD-10-CM

## 2018-09-23 DIAGNOSIS — I10 Essential (primary) hypertension: Secondary | ICD-10-CM

## 2018-09-23 DIAGNOSIS — Z0000 Encounter for general adult medical examination without abnormal findings: Secondary | ICD-10-CM

## 2018-09-23 NOTE — Patient Instructions (Signed)
Take a probiotic daily while you are on the antibiotics and for two weeks after completing the antibiotics.    Examples of probiotics:  Align (tablet), culturelle (powder) and florastor (powder).

## 2018-09-23 NOTE — Progress Notes (Signed)
[name]: Susan Weeks, female   DOB: 1951-04-14, 67 y.o.   MRN: [id number]   Subjective:    [name]: Susan Weeks, female    DOB: [birth year], 68 y.o.   MRN: [id number]  HPI  Patient here for her physical exam.  She reports she is doing well. Out of work.  (lay off). Doing well with this. Handling stress.  Trying to stay in due to COVID restrictions.  No fever.  No chest congestion, cough or sob.  No chest pain.  No acid reflux.  No abdominal pain.  Bowels moving.  Overall she feels she is handling things relatively well.  Does report right great toe infection.  Saw ortho.  On emycin.  Tolerating.  Has f/u planned with ortho.  States area has improved.     Past Medical History:  Diagnosis Date  . Abnormal liver function   . Colon cancer ([place])    adenocarcinoma of the cecum s/p partial colon resection  . History of colon polyps   . Hyperlipidemia   . Hypertension    Past Surgical History:  Procedure Laterality Date  . ABDOMINAL HYSTERECTOMY  1984   secondary to bleeding and fibroid tumors  . APPENDECTOMY    . COLONOSCOPY W/ POLYPECTOMY  2001  . COLONOSCOPY WITH PROPOFOL N/A 07/28/2016   Procedure: COLONOSCOPY WITH PROPOFOL;  Surgeon: [name], MD;  Location: [facility] ENDOSCOPY;  Service: Endoscopy;  Laterality: N/A;   Family History  Problem Relation Age of Onset  . Stroke Mother   . Hypertension Mother   . Cancer Father        Prostate   . Heart disease Brother        myocardial infarction  . Hypertension Brother   . Hypertension Sister        x4  . Breast cancer Neg Hx    Social History   Socioeconomic History  . Marital status: Married    Spouse name: [name]  . Number of children: 1  . Years of education: HS  . Highest education level: Not on file  Occupational History    Employer: OTHER    Comment: [employer]  Social Needs  . Financial resource strain: Not on file  . Food insecurity:    Worry: Not on file    Inability: Not on file  .  Transportation needs:    Medical: Not on file    Non-medical: Not on file  Tobacco Use  . Smoking status: Former Research scientist (life sciences)  . Smokeless tobacco: Never Used  Substance and Sexual Activity  . Alcohol use: No    Alcohol/week: 0.0 standard drinks  . Drug use: No  . Sexual activity: Not on file  Lifestyle  . Physical activity:    Days per week: Not on file    Minutes per session: Not on file  . Stress: Not on file  Relationships  . Social connections:    Talks on phone: Not on file    Gets together: Not on file    Attends religious service: Not on file    Active member of club or organization: Not on file    Attends meetings of clubs or organizations: Not on file    Relationship status: Not on file  Other Topics Concern  . Not on file  Social History Narrative  . Not on file    Outpatient Encounter Medications as of 09/23/2018  Medication Sig  . amLODipine (NORVASC) 5 MG tablet Take 1 tablet by mouth  once daily  . aspirin EC 81 MG tablet Take 1 tablet (81 mg total) by mouth daily.  . calcium carbonate (OS-CAL) 600 MG TABS Take 600 mg by mouth daily.   [name] erythromycin base (E-MYCIN) 500 MG tablet erythromycin 500 mg tablet  . lisinopril (PRINIVIL,ZESTRIL) 30 MG tablet Take 1 tablet (30 mg total) by mouth daily.  . rosuvastatin (CRESTOR) 5 MG tablet TAKE ONE TABLET BY MOUTH daily  . vitamin E 400 UNIT capsule Take 400 Units by mouth daily.    No facility-administered encounter medications on file as of 09/23/2018.     Review of Systems  Constitutional: Negative for appetite change and unexpected weight change.  HENT: Negative for congestion and sinus pressure.   Eyes: Negative for pain and visual disturbance.  Respiratory: Negative for cough, chest tightness and shortness of breath.   Cardiovascular: Negative for chest pain, palpitations and leg swelling.  Gastrointestinal: Negative for abdominal pain, diarrhea, nausea and vomiting.  Genitourinary: Negative for difficulty  urinating and dysuria.  Musculoskeletal: Negative for joint swelling and myalgias.  Skin: Negative for color change and rash.       Right great toe infection.    Neurological: Negative for dizziness, light-headedness and headaches.  Hematological: Negative for adenopathy. Does not bruise/bleed easily.  Psychiatric/Behavioral: Negative for agitation and dysphoric mood.       Objective:    Physical Exam Constitutional:      General: She is not in acute distress.    Appearance: Normal appearance. She is well-developed.  HENT:     Right Ear: External ear normal. There is no impacted cerumen.     Left Ear: External ear normal. There is no impacted cerumen.  Eyes:     General: No scleral icterus.       Right eye: No discharge.        Left eye: No discharge.     Conjunctiva/sclera: Conjunctivae normal.  Neck:     Musculoskeletal: Neck supple. No muscular tenderness.     Thyroid: No thyromegaly.  Cardiovascular:     Rate and Rhythm: Normal rate and regular rhythm.  Pulmonary:     Effort: No tachypnea, accessory muscle usage or respiratory distress.     Breath sounds: Normal breath sounds. No decreased breath sounds or wheezing.  Chest:     Breasts:        Right: No inverted nipple, mass, nipple discharge or tenderness (no axillary adenopathy).        Left: No inverted nipple, mass, nipple discharge or tenderness (no axilarry adenopathy).  Abdominal:     General: Bowel sounds are normal.     Palpations: Abdomen is soft.     Tenderness: There is no abdominal tenderness.  Musculoskeletal:        General: No swelling or tenderness.  Lymphadenopathy:     Cervical: No cervical adenopathy.  Skin:    Findings: No erythema or rash.     Comments: Right great toe - infection/drainage.  Minimal tenderness to palpation over the direct site.  No erythema extending up the toe.    Neurological:     Mental Status: She is alert and oriented to person, place, and time.  Psychiatric:         Mood and Affect: Mood normal.        Behavior: Behavior normal.     BP 122/62 (BP Location: Left Arm, Patient Position: Sitting, Cuff Size: Normal)   Pulse 97   Temp 99 F (37.2 C) (Oral)  Ht 5' 3"  (1.6 m)   Wt 158 lb 3.2 oz (71.8 kg)   LMP 06/28/1982   SpO2 99%   BMI 28.02 kg/m  Wt Readings from Last 3 Encounters:  09/23/18 158 lb 3.2 oz (71.8 kg)  06/01/18 156 lb 9.6 oz (71 kg)  05/25/18 158 lb 3.2 oz (71.8 kg)     Lab Results  Component Value Date   WBC 3.5 (L) 01/25/2018   HGB 15.6 01/25/2018   HCT 45.4 01/25/2018   PLT 202 01/25/2018   GLUCOSE 86 06/11/2018   CHOL 149 06/11/2018   TRIG 48.0 06/11/2018   HDL 60.20 06/11/2018   LDLDIRECT 150.8 03/02/2013   LDLCALC 79 06/11/2018   ALT 24 06/11/2018   AST 19 06/11/2018   NA 139 06/11/2018   K 4.1 06/11/2018   CL 103 06/11/2018   CREATININE 0.86 06/11/2018   BUN 21 06/11/2018   CO2 29 06/11/2018   TSH 0.82 06/11/2018   HGBA1C 5.6 06/11/2018    Mm 3d Screen Breast Bilateral  Result Date: 12/08/2017 CLINICAL DATA:  Screening. EXAM: DIGITAL SCREENING BILATERAL MAMMOGRAM WITH TOMO AND CAD COMPARISON:  Previous exam(s). ACR Breast Density Category b: There are scattered areas of fibroglandular density. FINDINGS: There are no findings suspicious for malignancy. Images were processed with CAD. IMPRESSION: No mammographic evidence of malignancy. A result letter of this screening mammogram will be mailed directly to the patient. RECOMMENDATION: Screening mammogram in one year. (Code:SM-B-01Y) BI-RADS CATEGORY  1: Negative. Electronically Signed   By: Claudie Revering M.D.   On: 12/08/2017 13:46       Assessment & Plan:   Problem List Items Addressed This Visit    Abnormal liver function    Previous abdominal ultrasound negative.  Follow liver function tests.        Relevant Orders   Hepatic function panel   Essential hypertension, benign    Blood pressure ha been under good control.  Continue current medication  regimen.  Follow pressures.  Follow metabolic panel.        Relevant Orders   Basic metabolic panel   Health care maintenance    Physical today 09/23/18.  Mammogram 12/08/17 - Birads I.  Colonoscopy 07/2016.  Recommended f/u in 3 years.        Hyperglycemia    Low carb diet and exercise.  Follow met b and a1c.        Relevant Orders   Hemoglobin A1c   Leukopenia    Recent white blood cell count slightly decreased.  Follow cbc.        Relevant Orders   CBC with Differential/Platelet   Pure hypercholesterolemia    On crestor.  Low cholesterol diet and exercise.  Follow lipid panel and liver function tests.        Relevant Orders   Lipid panel   Sleep apnea    CPAP.       Toe infection    Right great toe infection.  On emycin.  Seeing ortho.  Has f/u planned.  Reports is better.  Follow.        Relevant Medications   erythromycin base (E-MYCIN) 500 MG tablet    Other Visit Diagnoses    Routine general medical examination at a health care facility    -  Primary       [name], MD

## 2018-09-26 ENCOUNTER — Encounter: Payer: Self-pay | Admitting: Internal Medicine

## 2018-09-26 DIAGNOSIS — L089 Local infection of the skin and subcutaneous tissue, unspecified: Secondary | ICD-10-CM | POA: Insufficient documentation

## 2018-09-26 NOTE — Assessment & Plan Note (Signed)
Right great toe infection.  On emycin.  Seeing ortho.  Has f/u planned.  Reports is better.  Follow.

## 2018-09-26 NOTE — Assessment & Plan Note (Signed)
Recent white blood cell count slightly decreased.  Follow cbc.

## 2018-09-26 NOTE — Assessment & Plan Note (Signed)
Low carb diet and exercise.  Follow met b and a1c.   

## 2018-09-26 NOTE — Assessment & Plan Note (Signed)
Blood pressure ha been under good control.  Continue current medication regimen.  Follow pressures.  Follow metabolic panel.

## 2018-09-26 NOTE — Assessment & Plan Note (Signed)
On crestor.  Low cholesterol diet and exercise.  Follow lipid panel and liver function tests.   

## 2018-09-26 NOTE — Assessment & Plan Note (Signed)
CPAP.  

## 2018-09-26 NOTE — Assessment & Plan Note (Signed)
Previous abdominal ultrasound negative.  Follow liver function tests.   

## 2018-09-26 NOTE — Assessment & Plan Note (Signed)
Physical today 09/23/18.  Mammogram 12/08/17 - Birads I.  Colonoscopy 07/2016.  Recommended f/u in 3 years.

## 2018-10-01 ENCOUNTER — Other Ambulatory Visit: Payer: Self-pay

## 2018-10-01 ENCOUNTER — Telehealth: Payer: Self-pay | Admitting: Oncology

## 2018-10-01 ENCOUNTER — Inpatient Hospital Stay: Payer: BLUE CROSS/BLUE SHIELD | Attending: Oncology

## 2018-10-01 DIAGNOSIS — Z9049 Acquired absence of other specified parts of digestive tract: Secondary | ICD-10-CM | POA: Insufficient documentation

## 2018-10-01 DIAGNOSIS — Z85038 Personal history of other malignant neoplasm of large intestine: Secondary | ICD-10-CM | POA: Diagnosis not present

## 2018-10-01 DIAGNOSIS — Z87891 Personal history of nicotine dependence: Secondary | ICD-10-CM | POA: Insufficient documentation

## 2018-10-01 DIAGNOSIS — D751 Secondary polycythemia: Secondary | ICD-10-CM | POA: Insufficient documentation

## 2018-10-01 DIAGNOSIS — L03039 Cellulitis of unspecified toe: Secondary | ICD-10-CM | POA: Diagnosis not present

## 2018-10-01 LAB — CBC WITH DIFFERENTIAL/PLATELET
Abs Immature Granulocytes: 0 10*3/uL (ref 0.00–0.07)
Basophils Absolute: 0 10*3/uL (ref 0.0–0.1)
Basophils Relative: 1 %
Eosinophils Absolute: 0.1 10*3/uL (ref 0.0–0.5)
Eosinophils Relative: 3 %
HCT: 46 % (ref 36.0–46.0)
Hemoglobin: 15.4 g/dL — ABNORMAL HIGH (ref 12.0–15.0)
Immature Granulocytes: 0 %
Lymphocytes Relative: 34 %
Lymphs Abs: 1.5 10*3/uL (ref 0.7–4.0)
MCH: 29.4 pg (ref 26.0–34.0)
MCHC: 33.5 g/dL (ref 30.0–36.0)
MCV: 87.8 fL (ref 80.0–100.0)
Monocytes Absolute: 0.4 10*3/uL (ref 0.1–1.0)
Monocytes Relative: 9 %
Neutro Abs: 2.3 10*3/uL (ref 1.7–7.7)
Neutrophils Relative %: 53 %
Platelets: 245 10*3/uL (ref 150–400)
RBC: 5.24 MIL/uL — ABNORMAL HIGH (ref 3.87–5.11)
RDW: 12.1 % (ref 11.5–15.5)
WBC: 4.3 10*3/uL (ref 4.0–10.5)
nRBC: 0 % (ref 0.0–0.2)

## 2018-10-01 LAB — COMPREHENSIVE METABOLIC PANEL
ALT: 81 U/L — ABNORMAL HIGH (ref 0–44)
AST: 43 U/L — ABNORMAL HIGH (ref 15–41)
Albumin: 4.5 g/dL (ref 3.5–5.0)
Alkaline Phosphatase: 68 U/L (ref 38–126)
Anion gap: 7 (ref 5–15)
BUN: 19 mg/dL (ref 8–23)
CO2: 30 mmol/L (ref 22–32)
Calcium: 9.2 mg/dL (ref 8.9–10.3)
Chloride: 104 mmol/L (ref 98–111)
Creatinine, Ser: 1.03 mg/dL — ABNORMAL HIGH (ref 0.44–1.00)
GFR calc Af Amer: >60 mL/min (ref >60)
GFR calc non Af Amer: 56 mL/min — ABNORMAL LOW (ref >60)
Glucose, Bld: 75 mg/dL (ref 70–99)
Potassium: 4.4 mmol/L (ref 3.5–5.1)
Sodium: 141 mmol/L (ref 135–145)
Total Bilirubin: 0.7 mg/dL (ref 0.3–1.2)
Total Protein: 7.5 g/dL (ref 6.5–8.1)

## 2018-10-02 LAB — CEA: CEA: 4.4 ng/mL (ref 0.0–4.7)

## 2018-10-04 ENCOUNTER — Encounter: Payer: Self-pay | Admitting: Oncology

## 2018-10-04 ENCOUNTER — Inpatient Hospital Stay: Payer: BLUE CROSS/BLUE SHIELD | Attending: Oncology | Admitting: Oncology

## 2018-10-04 ENCOUNTER — Other Ambulatory Visit: Payer: BLUE CROSS/BLUE SHIELD

## 2018-10-04 ENCOUNTER — Other Ambulatory Visit: Payer: Self-pay

## 2018-10-04 DIAGNOSIS — D751 Secondary polycythemia: Secondary | ICD-10-CM | POA: Diagnosis not present

## 2018-10-04 DIAGNOSIS — R7401 Elevation of levels of liver transaminase levels: Secondary | ICD-10-CM

## 2018-10-04 DIAGNOSIS — G4730 Sleep apnea, unspecified: Secondary | ICD-10-CM

## 2018-10-04 DIAGNOSIS — R7989 Other specified abnormal findings of blood chemistry: Secondary | ICD-10-CM

## 2018-10-04 DIAGNOSIS — R740 Nonspecific elevation of levels of transaminase and lactic acid dehydrogenase [LDH]: Secondary | ICD-10-CM

## 2018-10-04 DIAGNOSIS — Z85038 Personal history of other malignant neoplasm of large intestine: Secondary | ICD-10-CM

## 2018-10-04 NOTE — Progress Notes (Signed)
HEMATOLOGY-ONCOLOGY TeleHEALTH VISIT PROGRESS NOTE  I connected with Susan Weeks on 10/04/18 at  1:15 PM EDT by video enabled telemedicine visit and verified that I am speaking with the correct person using two identifiers. I discussed the limitations, risks, security and privacy concerns of performing an evaluation and management service by telemedicine and the availability of in-person appointments. I also discussed with the patient that there may be a patient responsible charge related to this service. The patient expressed understanding and agreed to proceed.   Other persons participating in the visit and their role in the encounter:    Janeann Merl, RN, check in patient.   Patient's location: Home  Provider's location: home office Chief Complaint: Follow-up for history of colon cancer and erythrocytosis.   INTERVAL HISTORY Susan Weeks is a 68 y.o. female who has abov control e history reviewed by me today presents for follow up visit for management of history of colon cancer and erythrocytosis. Problems and complaints are listed below:  Patient reports doing well at baseline.  She has no new complaints. Denies any fever, chills, nausea, vomiting, abdominal pain.  Denies any blood in the stool. I referred patient to establish care with pulmonology Dr. Felicie Morn.  She was diagnosed sleep apnea and patient has been using CPAP machine since then. Reports sleep has significantly improved since the use of CPAP.  Review of Systems  Constitutional: Negative for appetite change, chills, fatigue and fever.  HENT:   Negative for hearing loss and voice change.   Eyes: Negative for eye problems.  Respiratory: Negative for chest tightness and cough.   Cardiovascular: Negative for chest pain.  Gastrointestinal: Negative for abdominal distention, abdominal pain and blood in stool.  Endocrine: Negative for hot flashes.  Genitourinary: Negative for difficulty urinating and frequency.    Musculoskeletal: Negative for arthralgias.  Skin: Negative for itching and rash.  Neurological: Negative for extremity weakness.  Hematological: Negative for adenopathy.  Psychiatric/Behavioral: Negative for confusion.    Past Medical History:  Diagnosis Date  . Abnormal liver function   . Colon cancer ([place])    adenocarcinoma of the cecum s/p partial colon resection  . History of colon polyps   . Hyperlipidemia   . Hypertension    Past Surgical History:  Procedure Laterality Date  . ABDOMINAL HYSTERECTOMY  1984   secondary to bleeding and fibroid tumors  . APPENDECTOMY    . COLONOSCOPY W/ POLYPECTOMY  2001  . COLONOSCOPY WITH PROPOFOL N/A 07/28/2016   Procedure: COLONOSCOPY WITH PROPOFOL;  Surgeon: [name], MD;  Location: [facility] ENDOSCOPY;  Service: Endoscopy;  Laterality: N/A;    Family History  Problem Relation Age of Onset  . Stroke Mother   . Hypertension Mother   . Cancer Father        Prostate   . Heart disease Brother        myocardial infarction  . Hypertension Brother   . Hypertension Sister        x4  . Breast cancer Neg Hx     Social History   Socioeconomic History  . Marital status: Married    Spouse name: [name]  . Number of children: 1  . Years of education: HS  . Highest education level: Not on file  Occupational History    Employer: OTHER    Comment: [employer]  Social Needs  . Financial resource strain: Not on file  . Food insecurity:    Worry: Not on file    Inability:  Not on file  . Transportation needs:    Medical: Not on file    Non-medical: Not on file  Tobacco Use  . Smoking status: Former Research scientist (life sciences)  . Smokeless tobacco: Never Used  Substance and Sexual Activity  . Alcohol use: No    Alcohol/week: 0.0 standard drinks  . Drug use: No  . Sexual activity: Not on file  Lifestyle  . Physical activity:    Days per week: Not on file    Minutes per session: Not on file  . Stress: Not on file  Relationships  . Social  connections:    Talks on phone: Not on file    Gets together: Not on file    Attends religious service: Not on file    Active member of club or organization: Not on file    Attends meetings of clubs or organizations: Not on file    Relationship status: Not on file  . Intimate partner violence:    Fear of current or ex partner: Not on file    Emotionally abused: Not on file    Physically abused: Not on file    Forced sexual activity: Not on file  Other Topics Concern  . Not on file  Social History Narrative  . Not on file    Current Outpatient Medications on File Prior to Visit  Medication Sig Dispense Refill  . amLODipine (NORVASC) 5 MG tablet Take 1 tablet by mouth once daily 90 tablet 0  . aspirin EC 81 MG tablet Take 1 tablet (81 mg total) by mouth daily. 90 tablet 3  . calcium carbonate (OS-CAL) 600 MG TABS Take 600 mg by mouth daily.     [name] erythromycin base (E-MYCIN) 500 MG tablet erythromycin 500 mg tablet    . lisinopril (PRINIVIL,ZESTRIL) 30 MG tablet Take 1 tablet (30 mg total) by mouth daily. 90 tablet 1  . rosuvastatin (CRESTOR) 5 MG tablet TAKE ONE TABLET BY MOUTH daily 90 tablet 1  . vitamin E 400 UNIT capsule Take 400 Units by mouth daily.      No current facility-administered medications on file prior to visit.     Allergies  Allergen Reactions  . Z-Pak [Azithromycin] Other (See Comments)    Caused Emesis       Observations/Objective: There were no vitals filed for this visit. There is no height or weight on file to calculate BMI.  Physical Exam  Constitutional: She is oriented to person, place, and time. No distress.  HENT:  Head: Normocephalic and atraumatic.  Pulmonary/Chest: Effort normal.  Musculoskeletal: Normal range of motion.  Neurological: She is alert and oriented to person, place, and time.    CBC    Component Value Date/Time   WBC 4.3 10/01/2018 1326   RBC 5.24 (H) 10/01/2018 1326   HGB 15.4 (H) 10/01/2018 1326   HGB 15.3 10/24/2013  1505   HCT 46.0 10/01/2018 1326   HCT 45.0 10/24/2013 1505   PLT 245 10/01/2018 1326   PLT 231 10/24/2013 1505   MCV 87.8 10/01/2018 1326   MCV 87 10/24/2013 1505   MCH 29.4 10/01/2018 1326   MCHC 33.5 10/01/2018 1326   RDW 12.1 10/01/2018 1326   RDW 13.1 10/24/2013 1505   LYMPHSABS 1.5 10/01/2018 1326   LYMPHSABS 1.8 10/24/2013 1505   MONOABS 0.4 10/01/2018 1326   MONOABS 0.3 10/24/2013 1505   EOSABS 0.1 10/01/2018 1326   EOSABS 0.1 10/24/2013 1505   BASOSABS 0.0 10/01/2018 1326   BASOSABS 0.1 10/24/2013 1505  CMP     Component Value Date/Time   NA 141 10/01/2018 1326   NA 141 10/24/2013 1505   K 4.4 10/01/2018 1326   K 4.1 10/24/2013 1505   CL 104 10/01/2018 1326   CL 104 10/24/2013 1505   CO2 30 10/01/2018 1326   CO2 30 10/24/2013 1505   GLUCOSE 75 10/01/2018 1326   GLUCOSE 97 10/24/2013 1505   BUN 19 10/01/2018 1326   BUN 19 (H) 10/24/2013 1505   CREATININE 1.03 (H) 10/01/2018 1326   CREATININE 0.96 10/24/2013 1505   CALCIUM 9.2 10/01/2018 1326   CALCIUM 9.4 10/24/2013 1505   PROT 7.5 10/01/2018 1326   PROT 7.7 10/24/2013 1505   ALBUMIN 4.5 10/01/2018 1326   ALBUMIN 4.0 10/24/2013 1505   AST 43 (H) 10/01/2018 1326   AST 20 10/24/2013 1505   ALT 81 (H) 10/01/2018 1326   ALT 49 10/24/2013 1505   ALKPHOS 68 10/01/2018 1326   ALKPHOS 80 10/24/2013 1505   BILITOT 0.7 10/01/2018 1326   BILITOT 0.4 10/24/2013 1505   GFRNONAA 56 (L) 10/01/2018 1326   GFRNONAA >60 10/24/2013 1505   GFRAA >60 10/01/2018 1326   GFRAA >60 10/24/2013 1505     Assessment and Plan: 1. History of colon cancer   2. Erythrocytosis   3. Sleep apnea, unspecified type   4. Transaminitis   5. Elevated serum creatinine     Labs are reviewed and discussed with patient. Secondary erythrocytosis has slightly improved.  Hemoglobin 15.4. Sleep apnea patient is using his CPAP machine.  Recommend patient to continue follow-up with pulmonology. Continue  hemoglobin CBC every 3  months.  Remote history of colon cancer[2011]. .  CEA within normal limits.  Continue to monitor. Last colonoscopy was in 2018 by Dr. Gustavo Lah.  Repeat colonoscopy in 3 years.  Due for colonoscopy March 2021.  Slightly increased creatinine level and transaminitis. Recommend patient to repeat labs in 1 to 2 weeks range. Encourage oral hydration. Avoid NSAIDS Patient tells me that she has follow-up appointment in the labs to be done with primary care provider Dr. [name] in a week.   Follow Up Instructions: 3 months.    I discussed the assessment and treatment plan with the patient. The patient was provided an opportunity to ask questions and all were answered. The patient agreed with the plan and demonstrated an understanding of the instructions.  The patient was advised to call back or seek an in-person evaluation if the symptoms worsen or if the condition fails to improve as anticipated.    Earlie Server, MD 10/04/2018

## 2018-10-04 NOTE — Progress Notes (Signed)
Patient contacted for telehealth visit. No concerns voiced. Currently on antibiotic for ingrown toenail.

## 2018-10-11 ENCOUNTER — Other Ambulatory Visit: Payer: Self-pay | Admitting: Internal Medicine

## 2018-10-12 ENCOUNTER — Other Ambulatory Visit (INDEPENDENT_AMBULATORY_CARE_PROVIDER_SITE_OTHER): Payer: BLUE CROSS/BLUE SHIELD

## 2018-10-12 ENCOUNTER — Other Ambulatory Visit: Payer: Self-pay

## 2018-10-12 DIAGNOSIS — I10 Essential (primary) hypertension: Secondary | ICD-10-CM | POA: Diagnosis not present

## 2018-10-12 DIAGNOSIS — R739 Hyperglycemia, unspecified: Secondary | ICD-10-CM

## 2018-10-12 DIAGNOSIS — R945 Abnormal results of liver function studies: Secondary | ICD-10-CM | POA: Diagnosis not present

## 2018-10-12 DIAGNOSIS — D72819 Decreased white blood cell count, unspecified: Secondary | ICD-10-CM

## 2018-10-12 DIAGNOSIS — E7800 Pure hypercholesterolemia, unspecified: Secondary | ICD-10-CM | POA: Diagnosis not present

## 2018-10-12 LAB — BASIC METABOLIC PANEL
BUN: 19 mg/dL (ref 6–23)
CO2: 30 mEq/L (ref 19–32)
Calcium: 9.2 mg/dL (ref 8.4–10.5)
Chloride: 102 mEq/L (ref 96–112)
Creatinine, Ser: 0.85 mg/dL (ref 0.40–1.20)
GFR: 80.26 mL/min (ref >60.00)
Glucose, Bld: 97 mg/dL (ref 70–99)
Potassium: 4.2 mEq/L (ref 3.5–5.1)
Sodium: 138 mEq/L (ref 135–145)

## 2018-10-12 LAB — CBC WITH DIFFERENTIAL/PLATELET
Basophils Absolute: 0 10*3/uL (ref 0.0–0.1)
Basophils Relative: 0.8 % (ref 0.0–3.0)
Eosinophils Absolute: 0.1 10*3/uL (ref 0.0–0.7)
Eosinophils Relative: 3.9 % (ref 0.0–5.0)
HCT: 45.3 % (ref 36.0–46.0)
Hemoglobin: 15.1 g/dL — ABNORMAL HIGH (ref 12.0–15.0)
Lymphocytes Relative: 37.1 % (ref 12.0–46.0)
Lymphs Abs: 1.3 10*3/uL (ref 0.7–4.0)
MCHC: 33.3 g/dL (ref 30.0–36.0)
MCV: 88.2 fl (ref 78.0–100.0)
Monocytes Absolute: 0.3 10*3/uL (ref 0.1–1.0)
Monocytes Relative: 7.8 % (ref 3.0–12.0)
Neutro Abs: 1.7 10*3/uL (ref 1.4–7.7)
Neutrophils Relative %: 50.4 % (ref 43.0–77.0)
Platelets: 223 10*3/uL (ref 150.0–400.0)
RBC: 5.14 Mil/uL — ABNORMAL HIGH (ref 3.87–5.11)
RDW: 13.2 % (ref 11.5–15.5)
WBC: 3.4 10*3/uL — ABNORMAL LOW (ref 4.0–10.5)

## 2018-10-12 LAB — HEMOGLOBIN A1C: Hgb A1c MFr Bld: 5.9 % (ref 4.6–6.5)

## 2018-10-12 LAB — LIPID PANEL
Cholesterol: 160 mg/dL (ref 0–200)
HDL: 68.1 mg/dL (ref >39.00)
LDL Cholesterol: 75 mg/dL (ref 0–99)
NonHDL: 91.59
Total CHOL/HDL Ratio: 2
Triglycerides: 84 mg/dL (ref 0.0–149.0)
VLDL: 16.8 mg/dL (ref 0.0–40.0)

## 2018-10-12 LAB — HEPATIC FUNCTION PANEL
ALT: 49 U/L — ABNORMAL HIGH (ref 0–35)
AST: 24 U/L (ref 0–37)
Albumin: 4.3 g/dL (ref 3.5–5.2)
Alkaline Phosphatase: 63 U/L (ref 39–117)
Bilirubin, Direct: 0.2 mg/dL (ref 0.0–0.3)
Total Bilirubin: 0.8 mg/dL (ref 0.2–1.2)
Total Protein: 6.8 g/dL (ref 6.0–8.3)

## 2018-10-25 DIAGNOSIS — L03039 Cellulitis of unspecified toe: Secondary | ICD-10-CM | POA: Diagnosis not present

## 2018-11-04 ENCOUNTER — Other Ambulatory Visit: Payer: Self-pay | Admitting: Internal Medicine

## 2018-11-04 DIAGNOSIS — Z1231 Encounter for screening mammogram for malignant neoplasm of breast: Secondary | ICD-10-CM

## 2018-12-14 ENCOUNTER — Other Ambulatory Visit: Payer: Self-pay | Admitting: Internal Medicine

## 2018-12-15 ENCOUNTER — Other Ambulatory Visit: Payer: Self-pay

## 2018-12-15 ENCOUNTER — Ambulatory Visit
Admission: RE | Admit: 2018-12-15 | Discharge: 2018-12-15 | Disposition: A | Discharge status: Home / Self Care | Payer: BC Managed Care – PPO | Source: Ambulatory Visit | Attending: Internal Medicine | Admitting: Internal Medicine

## 2018-12-15 DIAGNOSIS — Z1231 Encounter for screening mammogram for malignant neoplasm of breast: Secondary | ICD-10-CM | POA: Insufficient documentation

## 2018-12-20 ENCOUNTER — Other Ambulatory Visit: Payer: Self-pay | Admitting: Internal Medicine

## 2018-12-20 MED ORDER — AMLODIPINE BESYLATE 5 MG PO TABS: 5.0000 mg | ORAL_TABLET | Freq: Every day | ORAL | 1 refills | Status: DC

## 2018-12-20 NOTE — Telephone Encounter (Signed)
Medication Refill - Medication: amLODipine (NORVASC) 5 MG tablet    Has the patient contacted their pharmacy? No. Pt called and is requesting to have a 90 day supply be sent to new pharmacy. Please advise.  (Agent: If no, request that the patient contact the pharmacy for the refill.) (Agent: If yes, when and what did the pharmacy advise?)  Preferred Pharmacy (with phone number or street name):  [id number] [address], [place]  [place] [address]  Phone: [phone] Fax: [phone]  Not a 24 hour pharmacy; exact hours not known.     Agent: Please be advised that RX refills may take up to 3 business days. We ask that you follow-up with your pharmacy.

## 2018-12-20 NOTE — Telephone Encounter (Signed)
Refill sent as requested. 

## 2018-12-21 ENCOUNTER — Other Ambulatory Visit: Payer: Self-pay

## 2018-12-21 ENCOUNTER — Telehealth: Payer: Self-pay | Admitting: *Deleted

## 2018-12-21 MED ORDER — AMLODIPINE BESYLATE 5 MG PO TABS: 5.0000 mg | ORAL_TABLET | Freq: Every day | ORAL | 1 refills | Status: DC

## 2018-12-21 NOTE — Telephone Encounter (Signed)
Copied from [phone] [phone]. Topic: General - Other >> Dec 21, 2018 12:30 PM Leward Quan A wrote: Reason for CRM: Patent called to ask that her Rx are not switched from [facility] at this moment because her husbands insurance will not yet cover them. Asking for Rx for  amLODipine (NORVASC) 5 MG tablet to be sent to [facility] please.

## 2018-12-21 NOTE — Telephone Encounter (Signed)
rx refilled to [facility]. Left message for pt

## 2019-01-04 ENCOUNTER — Inpatient Hospital Stay: Payer: Medicare Other

## 2019-01-04 ENCOUNTER — Encounter: Payer: Self-pay | Admitting: Oncology

## 2019-01-04 ENCOUNTER — Inpatient Hospital Stay: Payer: Medicare Other | Admitting: Oncology

## 2019-01-11 ENCOUNTER — Telehealth: Payer: Self-pay | Admitting: Internal Medicine

## 2019-01-11 MED ORDER — ROSUVASTATIN CALCIUM 5 MG PO TABS: ORAL_TABLET | ORAL | 0 refills | Status: DC

## 2019-01-11 NOTE — Telephone Encounter (Signed)
Medication Refill - Medication: rosuvastatin (CRESTOR) 5 MG tablet  Has the patient contacted their pharmacy? No - states bottle states no refills left (Agent: If no, request that the patient contact the pharmacy for the refill.) (Agent: If yes, when and what did the pharmacy advise?)  Preferred Pharmacy (with phone number or street name):  [facility] [address], [place] [phone] (Phone) [phone] (Fax)   Agent: Please be advised that RX refills may take up to 3 business days. We ask that you follow-up with your pharmacy.

## 2019-01-17 MED ORDER — ROSUVASTATIN CALCIUM 5 MG PO TABS: ORAL_TABLET | ORAL | 0 refills | Status: DC

## 2019-01-17 MED ORDER — LISINOPRIL 30 MG PO TABS: 30.0000 mg | ORAL_TABLET | Freq: Every day | ORAL | 0 refills | Status: DC

## 2019-01-17 NOTE — Addendum Note (Signed)
Addended by: Jefferson Fuel on: 01/17/2019 09:53 AM   Modules accepted: Orders

## 2019-01-17 NOTE — Addendum Note (Signed)
Addended by: Kathrin Ruddy L on: 01/17/2019 10:00 AM   Modules accepted: Orders

## 2019-01-17 NOTE — Telephone Encounter (Signed)
Pt stated due to insurance changes she now needs [facility] Requesting rosuvastatin (CRESTOR) 5 MG tablet And any other rx to be transferred there. [facility] only transferred one rx.  [id number] Susan Weeks, [place] [phone] (Phone) [phone] (Fax)

## 2019-01-26 ENCOUNTER — Encounter: Payer: Self-pay | Admitting: Oncology

## 2019-01-26 ENCOUNTER — Other Ambulatory Visit: Payer: Self-pay

## 2019-01-26 NOTE — Progress Notes (Signed)
Screening questions reviewed.  Patient does not offer any problems today.

## 2019-01-27 ENCOUNTER — Other Ambulatory Visit: Payer: Self-pay

## 2019-01-27 ENCOUNTER — Encounter: Payer: Self-pay | Admitting: Oncology

## 2019-01-27 ENCOUNTER — Inpatient Hospital Stay (HOSPITAL_BASED_OUTPATIENT_CLINIC_OR_DEPARTMENT_OTHER): Payer: BC Managed Care – PPO | Admitting: Oncology

## 2019-01-27 ENCOUNTER — Inpatient Hospital Stay: Payer: BC Managed Care – PPO | Attending: Oncology

## 2019-01-27 VITALS — BP 136/81 | HR 78 | Resp 18 | Wt 168.0 lb

## 2019-01-27 DIAGNOSIS — D751 Secondary polycythemia: Secondary | ICD-10-CM

## 2019-01-27 DIAGNOSIS — Z85038 Personal history of other malignant neoplasm of large intestine: Secondary | ICD-10-CM | POA: Diagnosis not present

## 2019-01-27 DIAGNOSIS — Z7982 Long term (current) use of aspirin: Secondary | ICD-10-CM | POA: Insufficient documentation

## 2019-01-27 DIAGNOSIS — Z803 Family history of malignant neoplasm of breast: Secondary | ICD-10-CM | POA: Insufficient documentation

## 2019-01-27 DIAGNOSIS — E785 Hyperlipidemia, unspecified: Secondary | ICD-10-CM | POA: Insufficient documentation

## 2019-01-27 DIAGNOSIS — I10 Essential (primary) hypertension: Secondary | ICD-10-CM | POA: Diagnosis not present

## 2019-01-27 DIAGNOSIS — G4730 Sleep apnea, unspecified: Secondary | ICD-10-CM | POA: Insufficient documentation

## 2019-01-27 DIAGNOSIS — Z79899 Other long term (current) drug therapy: Secondary | ICD-10-CM | POA: Diagnosis not present

## 2019-01-27 DIAGNOSIS — Z87891 Personal history of nicotine dependence: Secondary | ICD-10-CM | POA: Diagnosis not present

## 2019-01-27 LAB — COMPREHENSIVE METABOLIC PANEL
ALT: 30 U/L (ref 0–44)
AST: 22 U/L (ref 15–41)
Albumin: 4.6 g/dL (ref 3.5–5.0)
Alkaline Phosphatase: 47 U/L (ref 38–126)
Anion gap: 6 (ref 5–15)
BUN: 21 mg/dL (ref 8–23)
CO2: 29 mmol/L (ref 22–32)
Calcium: 9.5 mg/dL (ref 8.9–10.3)
Chloride: 103 mmol/L (ref 98–111)
Creatinine, Ser: 0.8 mg/dL (ref 0.44–1.00)
GFR calc Af Amer: >60 mL/min (ref >60)
GFR calc non Af Amer: >60 mL/min (ref >60)
Glucose, Bld: 95 mg/dL (ref 70–99)
Potassium: 4 mmol/L (ref 3.5–5.1)
Sodium: 138 mmol/L (ref 135–145)
Total Bilirubin: 0.8 mg/dL (ref 0.3–1.2)
Total Protein: 7.1 g/dL (ref 6.5–8.1)

## 2019-01-27 LAB — CBC WITH DIFFERENTIAL/PLATELET
Abs Immature Granulocytes: 0 10*3/uL (ref 0.00–0.07)
Basophils Absolute: 0 10*3/uL (ref 0.0–0.1)
Basophils Relative: 1 %
Eosinophils Absolute: 0.1 10*3/uL (ref 0.0–0.5)
Eosinophils Relative: 3 %
HCT: 42.9 % (ref 36.0–46.0)
Hemoglobin: 14.1 g/dL (ref 12.0–15.0)
Immature Granulocytes: 0 %
Lymphocytes Relative: 34 %
Lymphs Abs: 1.5 10*3/uL (ref 0.7–4.0)
MCH: 28.7 pg (ref 26.0–34.0)
MCHC: 32.9 g/dL (ref 30.0–36.0)
MCV: 87.2 fL (ref 80.0–100.0)
Monocytes Absolute: 0.3 10*3/uL (ref 0.1–1.0)
Monocytes Relative: 6 %
Neutro Abs: 2.4 10*3/uL (ref 1.7–7.7)
Neutrophils Relative %: 56 %
Platelets: 214 10*3/uL (ref 150–400)
RBC: 4.92 MIL/uL (ref 3.87–5.11)
RDW: 12.1 % (ref 11.5–15.5)
WBC: 4.3 10*3/uL (ref 4.0–10.5)
nRBC: 0 % (ref 0.0–0.2)

## 2019-01-27 NOTE — Progress Notes (Signed)
Hematology/Oncology follow up note [facility] [phone] [phone]   Patient Care Team: [name], MD as PCP - General (Internal Medicine)  REFERRING PROVIDER: [name], MD  REASON FOR VISIT Follow up for treatment of erythrocytosis :   HISTORY OF PRESENTING ILLNESS:  Susan Weeks is a  68 y.o.  female with PMH listed below who was referred to me for evaluation of elevated hemoglobin.  Recent hemoglobin 16.2, hct 47.7, reviewed her previous labs, hemoglobin has been chronically high since 2017, ranging from 15.1 to 16.2.   Pertinent Oncology Histroy  Previous history of carcinoma of colon status post resection, has an intermittent rising CEA.  Status post resection, no evidence of malignancy.-2011 Patient had exploratory laparotomy because of abnormal PET scan following rising CEA and was negative for any malignancy. CEA  remains stable in the range of 5.0   Patient reports feeling well at baseline. She was former smoker. No second hand smoking.  Denies any family history of high hemoglobin, DVT. No personal history of DVT  #Erythrocytosis was thought to be secondary, negative for Jak 2 mutation with reflex, BCR ABL mutation negative. # Sleep Apnea, patient establish care with Dr. Hector Shade.  Started on CPAP machine.  INTERVAL HISTORY Susan Weeks is a 68 y.o. female who has above history reviewed by me today presents for follow-up visit for management of erythrocytosis, history of colon cancer. Patient reports doing well. She uses CPAP machine at home. Denies any unintentional weight loss, blood in the stool, abdominal pain.     Review of Systems  Constitutional: Negative for chills, fever, malaise/fatigue and weight loss.  HENT: Negative for congestion, ear discharge, ear pain, nosebleeds, sinus pain and sore throat.   Eyes: Negative for double vision, photophobia, pain, discharge and redness.   Respiratory: Negative for cough, hemoptysis, sputum production, shortness of breath and wheezing.   Cardiovascular: Negative for chest pain, palpitations, orthopnea, claudication and leg swelling.  Gastrointestinal: Negative for abdominal pain, blood in stool, constipation, diarrhea, heartburn, melena, nausea and vomiting.  Genitourinary: Negative for dysuria, flank pain, frequency and hematuria.  Musculoskeletal: Negative for back pain, myalgias and neck pain.  Skin: Negative for itching and rash.  Neurological: Negative for dizziness, tingling, tremors, focal weakness, weakness and headaches.  Endo/Heme/Allergies: Negative for environmental allergies. Does not bruise/bleed easily.  Psychiatric/Behavioral: Negative for depression and hallucinations. The patient is not nervous/anxious.     MEDICAL HISTORY:  Past Medical History:  Diagnosis Date  . Abnormal liver function   . Colon cancer ([place])    adenocarcinoma of the cecum s/p partial colon resection  . History of colon polyps   . Hyperlipidemia   . Hypertension     SURGICAL HISTORY: Past Surgical History:  Procedure Laterality Date  . ABDOMINAL HYSTERECTOMY  1984   secondary to bleeding and fibroid tumors  . APPENDECTOMY    . COLONOSCOPY W/ POLYPECTOMY  2001  . COLONOSCOPY WITH PROPOFOL N/A 07/28/2016   Procedure: COLONOSCOPY WITH PROPOFOL;  Surgeon: [name], MD;  Location: [facility] END[facility]COPY;  Service: Endoscopy;  Laterality: N/A;    SOCIAL HISTORY: Social History   Socioeconomic History  . Marital status: Married    Spouse name: [name]  . Number of children: 1  . Years of education: HS  . Highest education level: Not on file  Occupational History    Employer: OTHER    Comment: [employer]  Social Needs  . Financial resource strain: Not on file  .  Food insecurity    Worry: Not on file    Inability: Not on file  . Transportation needs    Medical: Not on file    Non-medical: Not on file  Tobacco Use  .  Smoking status: Former Research scientist (life sciences)  . Smokeless tobacco: Never Used  Substance and Sexual Activity  . Alcohol use: No    Alcohol/week: 0.0 standard drinks  . Drug use: No  . Sexual activity: Not on file  Lifestyle  . Physical activity    Days per week: Not on file    Minutes per session: Not on file  . Stress: Not on file  Relationships  . Social Herbalist on phone: Not on file    Gets together: Not on file    Attends religious service: Not on file    Active member of club or organization: Not on file    Attends meetings of clubs or organizations: Not on file    Relationship status: Not on file  . Intimate partner violence    Fear of current or ex partner: Not on file    Emotionally abused: Not on file    Physically abused: Not on file    Forced sexual activity: Not on file  Other Topics Concern  . Not on file  Social History Narrative  . Not on file    FAMILY HISTORY: Family History  Problem Relation Age of Onset  . Stroke Mother   . Hypertension Mother   . Cancer Father        Prostate   . Heart disease Brother        myocardial infarction  . Hypertension Brother   . Hypertension Sister        x4  . Breast cancer Neg Hx     ALLERGIES:  is allergic to z-pak [azithromycin].  MEDICATIONS:  Current Outpatient Medications  Medication Sig Dispense Refill  . amLODipine (NORVASC) 5 MG tablet Take 1 tablet (5 mg total) by mouth daily. 90 tablet 1  . aspirin EC 81 MG tablet Take 1 tablet (81 mg total) by mouth daily. 90 tablet 3  . calcium carbonate (OS-CAL) 600 MG TABS Take 600 mg by mouth daily.     [name] lisinopril (ZESTRIL) 30 MG tablet Take 1 tablet (30 mg total) by mouth daily. 90 tablet 0  . rosuvastatin (CRESTOR) 5 MG tablet Take 1 tablet by mouth once daily 90 tablet 0  . vitamin E 400 UNIT capsule Take 400 Units by mouth daily.     [name] erythromycin base (E-MYCIN) 500 MG tablet erythromycin 500 mg tablet     No current facility-administered medications for  this visit.      PHYSICAL EXAMINATION: ECOG PERFORMANCE STATUS: 0 - Asymptomatic Vitals:   01/27/19 1103 01/27/19 1105  BP:  136/81  Pulse:  78  Resp: 18    Filed Weights   01/27/19 1103  Weight: 168 lb (76.2 kg)    Physical Exam Constitutional:      General: She is not in acute distress.    Appearance: She is well-developed.  HENT:     Head: Normocephalic and atraumatic.     Right Ear: External ear normal.     Left Ear: External ear normal.  Eyes:     General: No scleral icterus.    Conjunctiva/sclera: Conjunctivae normal.     Pupils: Pupils are equal, round, and reactive to light.  Neck:     Musculoskeletal: Normal range of motion and neck  supple.  Cardiovascular:     Rate and Rhythm: Normal rate and regular rhythm.     Heart sounds: Normal heart sounds.  Pulmonary:     Effort: Pulmonary effort is normal. No respiratory distress.     Breath sounds: Normal breath sounds. No wheezing or rales.  Chest:     Chest wall: No tenderness.  Abdominal:     General: Bowel sounds are normal. There is no distension.     Palpations: Abdomen is soft. There is no mass.     Tenderness: There is no abdominal tenderness.  Musculoskeletal: Normal range of motion.        General: No deformity.  Lymphadenopathy:     Cervical: No cervical adenopathy.  Skin:    General: Skin is warm and dry.     Findings: No erythema or rash.  Neurological:     Mental Status: She is alert and oriented to person, place, and time.     Cranial Nerves: No cranial nerve deficit.     Coordination: Coordination normal.  Psychiatric:        Behavior: Behavior normal.        Thought Content: Thought content normal.      LABORATORY DATA:  I have reviewed the data as listed Lab Results  Component Value Date   WBC 4.3 01/27/2019   HGB 14.1 01/27/2019   HCT 42.9 01/27/2019   MCV 87.2 01/27/2019   PLT 214 01/27/2019   Recent Labs    02/02/18 0801 06/11/18 0808 10/01/18 1326 10/12/18 0803  01/27/19 1019  NA 137 139 141 138 138  K 4.7 4.1 4.4 4.2 4.0  CL 102 103 104 102 103  CO2 30 29 30 30 29   GLUCOSE 96 86 75 97 95  BUN 18 21 19 19 21   CREATININE 0.86 0.86 1.03* 0.85 0.80  CALCIUM 9.3 9.0 9.2 9.2 9.5  GFRNONAA  --   --  56*  --  >60  GFRAA  --   --  >60  --  >60  PROT 7.0 7.1 7.5 6.8 7.1  ALBUMIN 4.3 4.5 4.5 4.3 4.6  AST 19 19 43* 24 22  ALT 20 24 81* 49* 30  ALKPHOS 49 56 68 63 47  BILITOT 0.7 0.7 0.7 0.8 0.8  BILIDIR 0.1 0.1  --  0.2  --        ASSESSMENT & PLAN:  1. History of colon cancer   2. Erythrocytosis    #Labs reviewed and discussed with patient. Secondary erythrocytosis due to sleep apnea has resolved.  Hemoglobin is normal. History of colon cancer, clinically doing well.  Asymptomatic.  CBC CMP CEA was obtained today. Colonoscopy due March 2021. Discussed with patient.    Orders Placed This Encounter  Procedures  . CBC with Differential/Platelet    Standing Status:   Future    Standing Expiration Date:   07/26/2020  . Comprehensive metabolic panel    Standing Status:   Future    Standing Expiration Date:   07/26/2020  . CEA    Standing Status:   Future    Standing Expiration Date:   07/26/2020    Return of visit 6 months.   Total face to face encounter time for this patient visit was 15 min. >50% of the time was  spent in counseling and coordination of care.  Earlie Server, MD, PhD Hematology Oncology [facility] Pager- [phone] 01/27/2019

## 2019-01-27 NOTE — Progress Notes (Signed)
Patient denies any concerns today.  

## 2019-01-28 LAB — CEA: CEA: 4.6 ng/mL (ref 0.0–4.7)

## 2019-02-01 ENCOUNTER — Telehealth: Payer: Self-pay

## 2019-02-01 NOTE — Telephone Encounter (Signed)
Copied from [place] [id number]. Topic: General - Other >> Feb 01, 2019  9:56 AM Celene Kras A wrote: Reason for CRM: Pt called stating she would like to come in person to her appt on 02/03/2019. Please advise.

## 2019-02-03 ENCOUNTER — Encounter: Payer: Self-pay | Admitting: Internal Medicine

## 2019-02-03 ENCOUNTER — Ambulatory Visit (INDEPENDENT_AMBULATORY_CARE_PROVIDER_SITE_OTHER): Payer: BC Managed Care – PPO | Admitting: Internal Medicine

## 2019-02-03 ENCOUNTER — Other Ambulatory Visit: Payer: Self-pay

## 2019-02-03 DIAGNOSIS — R945 Abnormal results of liver function studies: Secondary | ICD-10-CM | POA: Diagnosis not present

## 2019-02-03 DIAGNOSIS — G4730 Sleep apnea, unspecified: Secondary | ICD-10-CM

## 2019-02-03 DIAGNOSIS — I10 Essential (primary) hypertension: Secondary | ICD-10-CM

## 2019-02-03 DIAGNOSIS — R739 Hyperglycemia, unspecified: Secondary | ICD-10-CM

## 2019-02-03 DIAGNOSIS — Z85038 Personal history of other malignant neoplasm of large intestine: Secondary | ICD-10-CM | POA: Diagnosis not present

## 2019-02-03 DIAGNOSIS — D751 Secondary polycythemia: Secondary | ICD-10-CM

## 2019-02-03 DIAGNOSIS — Z23 Encounter for immunization: Secondary | ICD-10-CM

## 2019-02-03 DIAGNOSIS — E7800 Pure hypercholesterolemia, unspecified: Secondary | ICD-10-CM

## 2019-02-03 NOTE — Progress Notes (Signed)
[name]: Susan Weeks, female   DOB: 1950-07-03, 68 y.o.   MRN: [id number]   Subjective:    [name]: Susan Weeks, female    DOB: [birth year], 68 y.o.   MRN: [id number]  HPI  Patient here for a scheduled follow up.  She reports she is doing well. Enjoying being retired.  Recently evaluated by hematology 01/26/29.  hgb improved with treatment of sleep apnea.  Doing well.  Using cpap regularly.  Tries to stay active.  No chest pain.  No sob.  No acid reflux.  No abdominal pain.  Bowels moving.  Blood pressure doing well.  Last a1c 5.9.  Overall feels good.     Past Medical History:  Diagnosis Date  . Abnormal liver function   . Colon cancer ([place])    adenocarcinoma of the cecum s/p partial colon resection  . History of colon polyps   . Hyperlipidemia   . Hypertension    Past Surgical History:  Procedure Laterality Date  . ABDOMINAL HYSTERECTOMY  1984   secondary to bleeding and fibroid tumors  . APPENDECTOMY    . COLONOSCOPY W/ POLYPECTOMY  2001  . COLONOSCOPY WITH PROPOFOL N/A 07/28/2016   Procedure: COLONOSCOPY WITH PROPOFOL;  Surgeon: [name], MD;  Location: [facility] ENDOSCOPY;  Service: Endoscopy;  Laterality: N/A;   Family History  Problem Relation Age of Onset  . Stroke Mother   . Hypertension Mother   . Cancer Father        Prostate   . Heart disease Brother        myocardial infarction  . Hypertension Brother   . Hypertension Sister        x4  . Breast cancer Neg Hx    Social History   Socioeconomic History  . Marital status: Married    Spouse name: [name]  . Number of children: 1  . Years of education: HS  . Highest education level: Not on file  Occupational History    Employer: OTHER    Comment: [employer]  Social Needs  . Financial resource strain: Not on file  . Food insecurity    Worry: Not on file    Inability: Not on file  . Transportation needs    Medical: Not on file    Non-medical: Not on file  Tobacco Use  . Smoking  status: Former Research scientist (life sciences)  . Smokeless tobacco: Never Used  Substance and Sexual Activity  . Alcohol use: No    Alcohol/week: 0.0 standard drinks  . Drug use: No  . Sexual activity: Not on file  Lifestyle  . Physical activity    Days per week: Not on file    Minutes per session: Not on file  . Stress: Not on file  Relationships  . Social Herbalist on phone: Not on file    Gets together: Not on file    Attends religious service: Not on file    Active member of club or organization: Not on file    Attends meetings of clubs or organizations: Not on file    Relationship status: Not on file  Other Topics Concern  . Not on file  Social History Narrative  . Not on file    Outpatient Encounter Medications as of 02/03/2019  Medication Sig  . amLODipine (NORVASC) 5 MG tablet Take 1 tablet (5 mg total) by mouth daily.  [name] aspirin EC 81 MG tablet Take 1 tablet (81 mg total) by mouth daily.  [name]  calcium carbonate (OS-CAL) 600 MG TABS Take 600 mg by mouth daily.   [name] erythromycin base (E-MYCIN) 500 MG tablet erythromycin 500 mg tablet  . lisinopril (ZESTRIL) 30 MG tablet Take 1 tablet (30 mg total) by mouth daily.  . rosuvastatin (CRESTOR) 5 MG tablet Take 1 tablet by mouth once daily  . vitamin E 400 UNIT capsule Take 400 Units by mouth daily.    No facility-administered encounter medications on file as of 02/03/2019.     Review of Systems  Constitutional: Negative for appetite change and unexpected weight change.  HENT: Negative for congestion and sinus pressure.   Respiratory: Negative for cough, chest tightness and shortness of breath.   Cardiovascular: Negative for chest pain, palpitations and leg swelling.  Gastrointestinal: Negative for abdominal pain, diarrhea, nausea and vomiting.  Genitourinary: Negative for difficulty urinating and dysuria.  Musculoskeletal: Negative for joint swelling and myalgias.  Skin: Negative for color change and rash.  Neurological: Negative for  dizziness, light-headedness and headaches.  Psychiatric/Behavioral: Negative for agitation and dysphoric mood.       Objective:    Physical Exam Constitutional:      General: She is not in acute distress.    Appearance: Normal appearance.  HENT:     Head: Normocephalic and atraumatic.     Right Ear: External ear normal.     Left Ear: External ear normal.  Eyes:     General: No scleral icterus.       Right eye: No discharge.        Left eye: No discharge.     Conjunctiva/sclera: Conjunctivae normal.  Neck:     Musculoskeletal: Neck supple. No muscular tenderness.     Thyroid: No thyromegaly.  Cardiovascular:     Rate and Rhythm: Normal rate and regular rhythm.  Pulmonary:     Effort: No respiratory distress.     Breath sounds: Normal breath sounds. No wheezing.  Abdominal:     General: Bowel sounds are normal.     Palpations: Abdomen is soft.     Tenderness: There is no abdominal tenderness.  Musculoskeletal:        General: No swelling or tenderness.  Lymphadenopathy:     Cervical: No cervical adenopathy.  Skin:    Findings: No erythema or rash.  Neurological:     Mental Status: She is alert.  Psychiatric:        Mood and Affect: Mood normal.        Behavior: Behavior normal.     BP 120/64   Pulse 83   Temp (!) 95.5 F (35.3 C)   Resp 16   Wt 168 lb 6.4 oz (76.4 kg)   LMP 06/28/1982   SpO2 97%   BMI 29.83 kg/m  Wt Readings from Last 3 Encounters:  02/03/19 168 lb 6.4 oz (76.4 kg)  01/27/19 168 lb (76.2 kg)  09/23/18 158 lb 3.2 oz (71.8 kg)     Lab Results  Component Value Date   WBC 4.3 01/27/2019   HGB 14.1 01/27/2019   HCT 42.9 01/27/2019   PLT 214 01/27/2019   GLUCOSE 95 01/27/2019   CHOL 160 10/12/2018   TRIG 84.0 10/12/2018   HDL 68.10 10/12/2018   LDLDIRECT 150.8 03/02/2013   LDLCALC 75 10/12/2018   ALT 30 01/27/2019   AST 22 01/27/2019   NA 138 01/27/2019   K 4.0 01/27/2019   CL 103 01/27/2019   CREATININE 0.80 01/27/2019   BUN  21 01/27/2019   CO2  29 01/27/2019   TSH 0.82 06/11/2018   HGBA1C 5.9 10/12/2018    Mm 3d Screen Breast Bilateral  Result Date: 12/15/2018 CLINICAL DATA:  Screening. EXAM: DIGITAL SCREENING BILATERAL MAMMOGRAM WITH TOMO AND CAD COMPARISON:  Previous exam(s). ACR Breast Density Category b: There are scattered areas of fibroglandular density. FINDINGS: There are no findings suspicious for malignancy. Images were processed with CAD. IMPRESSION: No mammographic evidence of malignancy. A result letter of this screening mammogram will be mailed directly to the patient. RECOMMENDATION: Screening mammogram in one year. (Code:SM-B-01Y) BI-RADS CATEGORY  1: Negative. Electronically Signed   By: Audie Pinto M.D.   On: 12/15/2018 09:53       Assessment & Plan:   Problem List Items Addressed This Visit    Abnormal liver function    Previous abdominal ultrasound negative.  Follow liver function tests.        Erythrocytosis    Followed by hematology.  Being treated for sleep apnea.  Improved.        Essential hypertension, benign    Blood pressure under good control.  Continue same medication regimen.  Follow pressures.  Follow metabolic panel.        History of colon cancer    Colonoscopy 07/2016.  Recommended f/u in 3 years.        Hyperglycemia    Low carb diet and exercise.  Follow met b and a1c.  Last a1c 5.9.        Relevant Orders   Hemoglobin A1c   Pure hypercholesterolemia    On crestor.  Low cholesterol diet and exercise.  Follow lipid panel and liver function tests.        Relevant Orders   Lipid panel   Sleep apnea    Continue treatment with cpap.         Other Visit Diagnoses    Need for immunization against influenza       Relevant Orders   Flu Vaccine QUAD High Dose(Fluad) (Completed)       [name], MD

## 2019-02-06 ENCOUNTER — Encounter: Payer: Self-pay | Admitting: Internal Medicine

## 2019-02-06 NOTE — Assessment & Plan Note (Signed)
Blood pressure under good control.  Continue same medication regimen.  Follow pressures.  Follow metabolic panel.   

## 2019-02-06 NOTE — Assessment & Plan Note (Signed)
Low carb diet and exercise.  Follow met b and a1c.  Last a1c 5.9.   

## 2019-02-06 NOTE — Assessment & Plan Note (Signed)
Previous abdominal ultrasound negative.  Follow liver function tests.   

## 2019-02-06 NOTE — Assessment & Plan Note (Signed)
Followed by hematology.  Being treated for sleep apnea.  Improved.

## 2019-02-06 NOTE — Assessment & Plan Note (Signed)
Colonoscopy 07/2016.  Recommended f/u in 3 years.

## 2019-02-06 NOTE — Assessment & Plan Note (Signed)
Continue treatment with cpap.

## 2019-02-06 NOTE — Assessment & Plan Note (Signed)
On crestor.  Low cholesterol diet and exercise.  Follow lipid panel and liver function tests.   

## 2019-02-24 ENCOUNTER — Other Ambulatory Visit: Payer: Self-pay

## 2019-02-24 ENCOUNTER — Other Ambulatory Visit (INDEPENDENT_AMBULATORY_CARE_PROVIDER_SITE_OTHER): Payer: BC Managed Care – PPO

## 2019-02-24 DIAGNOSIS — E7800 Pure hypercholesterolemia, unspecified: Secondary | ICD-10-CM

## 2019-02-24 DIAGNOSIS — R739 Hyperglycemia, unspecified: Secondary | ICD-10-CM | POA: Diagnosis not present

## 2019-02-24 LAB — LIPID PANEL
Cholesterol: 139 mg/dL (ref 0–200)
HDL: 54.2 mg/dL (ref >39.00)
LDL Cholesterol: 75 mg/dL (ref 0–99)
NonHDL: 84.34
Total CHOL/HDL Ratio: 3
Triglycerides: 48 mg/dL (ref 0.0–149.0)
VLDL: 9.6 mg/dL (ref 0.0–40.0)

## 2019-02-24 LAB — HEMOGLOBIN A1C: Hgb A1c MFr Bld: 5.5 % (ref 4.6–6.5)

## 2019-04-05 ENCOUNTER — Encounter: Payer: Self-pay | Admitting: Pulmonary Disease

## 2019-04-05 ENCOUNTER — Ambulatory Visit (INDEPENDENT_AMBULATORY_CARE_PROVIDER_SITE_OTHER): Payer: BC Managed Care – PPO | Admitting: Pulmonary Disease

## 2019-04-05 VITALS — Ht 63.0 in | Wt 168.0 lb

## 2019-04-05 DIAGNOSIS — G4733 Obstructive sleep apnea (adult) (pediatric): Secondary | ICD-10-CM

## 2019-04-05 NOTE — Patient Instructions (Signed)
Follow up in 1 year.

## 2019-04-05 NOTE — Progress Notes (Signed)
Pulmonary, Critical Care, and Sleep Medicine  Chief Complaint  Patient presents with  . Follow-up    former DR pt- wearing cpap 4-6avg nightly- feels pressure and mask are okay.     Constitutional:  Ht 5\' 3"  (1.6 m)   Wt 168 lb (76.2 kg)   LMP 06/28/1982   BMI 29.76 kg/m   Past Medical History:  HTN, HLD, Colon cancer  Brief Summary:  Susan Weeks is a 68 y.o. female with obstructive sleep apnea.  Virtual Visit via Telephone Note  I connected with Susan Weeks on 04/05/19 at 10:30 AM EST by telephone and verified that I am speaking with the correct person using two identifiers.  Location: Patient: home Provider: medical office   I discussed the limitations, risks, security and privacy concerns of performing an evaluation and management service by telephone and the availability of in person appointments. I also discussed with the patient that there may be a patient responsible charge related to this service. The patient expressed understanding and agreed to proceed.  Previously seen by Dr. Ashby Dawes.  Home sleep study from 2019 showed mild OSA with oxygen desaturation.  Was having problem with erythrocytosis before.  Hb from 01/27/19 was 14.1.  She notices air leak from around her tube connection to her mask.  This happens intermittently.  Otherwise no issues with mask fit.  Not having dry mouth, sinus congestion, sore throat, or aerophagia.  Goes to bed at 9 pm.  Falls asleep quickly.  She retired from work in March 2020.  She sleeps through the night.  Wakes up early in the morning to help her husband and son get to work.  Sometimes goes back to sleep for an hour afterward.  Feels rested during the day otherwise.  Physical Exam:   Deferred.   Assessment/Plan:   Obstructive sleep apnea. - she is compliant with CPAP and reports benefit from therapy - continue auto CPAP - advised her to discuss with her DME about whether she needs replacement tubing or  mask refitting to alleviate air leak   Patient Instructions  Follow up in 1 year   I discussed the assessment and treatment plan with the patient. The patient was provided an opportunity to ask questions and all were answered. The patient agreed with the plan and demonstrated an understanding of the instructions.   The patient was advised to call back or seek an in-person evaluation if the symptoms worsen or if the condition fails to improve as anticipated.  I provided 14 minutes of non-face-to-face time during this encounter.   Chesley Mires, MD [facility] Pager: [phone] 04/05/2019, 10:34 AM  Flow Sheet      Sleep tests:  HST 12/28/17 >> AHI 8.5, SpO2 low 72% Auto CPAP 03/05/19 to 04/03/19 >> used on 30 of 30 nights with average 4 hrs 39 min.  Average AHI 3.8 with median CPAP 7 and 95 th percentile CPAP 11 cm H2O.  Some air leak.  Medications:   Allergies as of 04/05/2019      Reactions   Z-pak [azithromycin] Other (See Comments)   Caused Emesis      Medication List       Accurate as of April 05, 2019 10:34 AM. If you have any questions, ask your nurse or doctor.        amLODipine 5 MG tablet Commonly known as: NORVASC Take 1 tablet (5 mg total) by mouth daily.   aspirin EC 81 MG tablet Take 1  tablet (81 mg total) by mouth daily.   calcium carbonate 600 MG Tabs tablet Commonly known as: OS-CAL Take 600 mg by mouth daily.   erythromycin base 500 MG tablet Commonly known as: E-MYCIN erythromycin 500 mg tablet   lisinopril 30 MG tablet Commonly known as: ZESTRIL Take 1 tablet (30 mg total) by mouth daily.   rosuvastatin 5 MG tablet Commonly known as: CRESTOR Take 1 tablet by mouth once daily   vitamin E 400 UNIT capsule Take 400 Units by mouth daily.       Past Surgical History:  She  has a past surgical history that includes Appendectomy; Abdominal hysterectomy (1984); Colonoscopy with propofol (N/A, 07/28/2016); and  Colonoscopy w/ polypectomy (2001).  Family History:  Her family history includes Cancer in her father; Heart disease in her brother; Hypertension in her brother, mother, and sister; Stroke in her mother.  Social History:  She  reports that she has quit smoking. She has never used smokeless tobacco. She reports that she does not drink alcohol or use drugs.

## 2019-04-20 ENCOUNTER — Other Ambulatory Visit: Payer: Self-pay | Admitting: Internal Medicine

## 2019-04-22 ENCOUNTER — Other Ambulatory Visit: Payer: Self-pay | Admitting: Internal Medicine

## 2019-06-08 ENCOUNTER — Ambulatory Visit (INDEPENDENT_AMBULATORY_CARE_PROVIDER_SITE_OTHER): Payer: BC Managed Care – PPO | Admitting: Internal Medicine

## 2019-06-08 ENCOUNTER — Encounter: Payer: Self-pay | Admitting: Internal Medicine

## 2019-06-08 ENCOUNTER — Other Ambulatory Visit: Payer: Self-pay

## 2019-06-08 DIAGNOSIS — R945 Abnormal results of liver function studies: Secondary | ICD-10-CM

## 2019-06-08 DIAGNOSIS — G4730 Sleep apnea, unspecified: Secondary | ICD-10-CM

## 2019-06-08 DIAGNOSIS — D751 Secondary polycythemia: Secondary | ICD-10-CM | POA: Diagnosis not present

## 2019-06-08 DIAGNOSIS — Z85038 Personal history of other malignant neoplasm of large intestine: Secondary | ICD-10-CM

## 2019-06-08 DIAGNOSIS — E7800 Pure hypercholesterolemia, unspecified: Secondary | ICD-10-CM

## 2019-06-08 DIAGNOSIS — R739 Hyperglycemia, unspecified: Secondary | ICD-10-CM | POA: Diagnosis not present

## 2019-06-08 DIAGNOSIS — I10 Essential (primary) hypertension: Secondary | ICD-10-CM

## 2019-06-08 DIAGNOSIS — D582 Other hemoglobinopathies: Secondary | ICD-10-CM

## 2019-06-08 NOTE — Assessment & Plan Note (Signed)
Previous abdominal ultrasound negative.  Follow liver function tests.  Scheduled to be checked by Dr Tasia Catchings.

## 2019-06-08 NOTE — Assessment & Plan Note (Signed)
Followed by hematology.  Improved with treatment of OSA.  Has f/u planned 07/26/19.

## 2019-06-08 NOTE — Assessment & Plan Note (Signed)
On crestor.  Low cholesterol diet and exercise.  Follow lipid panel and liver function tests.   

## 2019-06-08 NOTE — Assessment & Plan Note (Signed)
Blood pressure under good control.  Continue same medication regimen.  Follow pressures.  Follow metabolic panel.   

## 2019-06-08 NOTE — Assessment & Plan Note (Signed)
Low carb diet and exercise.  Follow met b and a1c.  

## 2019-06-08 NOTE — Assessment & Plan Note (Signed)
Continue cpap.  

## 2019-06-08 NOTE — Assessment & Plan Note (Signed)
Worked up and followed by Dr Tasia Catchings.  Improved with treatment of sleep apnea.  Has f/u with Dr Tasia Catchings in 07/2019.

## 2019-06-08 NOTE — Progress Notes (Signed)
[name]: Susan Weeks, female   DOB: [birth year], 69 y.o.   MRN: [id number]   Virtual Visit via Telephone Note  This visit type was conducted due to national recommendations for restrictions regarding the COVID-19 pandemic (e.g. social distancing).  This format is felt to be most appropriate for this patient at this time.  All issues noted in this document were discussed and addressed.  No physical exam was performed (except for noted visual exam findings with Video Visits).   I connected with Dennis Bast by telephone and verified that I am speaking with the correct person using two identifiers. Location patient: home Location provider: work  Persons participating in the telephone visit: patient, provider  The limitations, risks, security and privacy concerns of performing an evaluation and management service by telephone and the availability of in person appointments have been discussed.  The patient expressed understanding and agreed to proceed.   Reason for visit: scheduled follow up  HPI: She reports she is doing well.  Feels good.  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  Enjoying this.  Not exercising as much.  Does try to stay active. No chest pain.  No sob.  No acid reflux.  No abdominal pain.  Bowels moving.  Handling stress.  Using cpap at least 4 hours per night.  Tolerating better since she has adjusted tubing placement.  Blood pressure doing well.  States was 120/70s on recent check.  No sinus or allergy symptoms.  No chest congestion.     ROS: See pertinent positives and negatives per HPI.  Past Medical History:  Diagnosis Date  . Abnormal liver function   . Colon cancer ([place])    adenocarcinoma of the cecum s/p partial colon resection  . History of colon polyps   . Hyperlipidemia   . Hypertension     Past Surgical History:  Procedure Laterality Date  . ABDOMINAL HYSTERECTOMY  1984   secondary to bleeding and fibroid tumors  .  APPENDECTOMY    . COLONOSCOPY W/ POLYPECTOMY  2001  . COLONOSCOPY WITH PROPOFOL N/A 07/28/2016   Procedure: COLONOSCOPY WITH PROPOFOL;  Surgeon: [name], MD;  Location: [facility] ENDOSCOPY;  Service: Endoscopy;  Laterality: N/A;    Family History  Problem Relation Age of Onset  . Stroke Mother   . Hypertension Mother   . Cancer Father        Prostate   . Heart disease Brother        myocardial infarction  . Hypertension Brother   . Hypertension Sister        x4  . Breast cancer Neg Hx     SOCIAL HX: reviewed.    Current Outpatient Medications:  .  amLODipine (NORVASC) 5 MG tablet, Take 1 tablet (5 mg total) by mouth daily., Disp: 90 tablet, Rfl: 1 .  aspirin EC 81 MG tablet, Take 1 tablet (81 mg total) by mouth daily., Disp: 90 tablet, Rfl: 3 .  calcium carbonate (OS-CAL) 600 MG TABS, Take 600 mg by mouth daily. , Disp: , Rfl:  .  erythromycin base (E-MYCIN) 500 MG tablet, erythromycin 500 mg tablet, Disp: , Rfl:  .  lisinopril (ZESTRIL) 30 MG tablet, TAKE 1 TABLET BY MOUTH EVERY DAY, Disp: 90 tablet, Rfl: 0 .  rosuvastatin (CRESTOR) 5 MG tablet, TAKE 1 TABLET BY MOUTH EVERY DAY, Disp: 90 tablet, Rfl: 3 .  vitamin E 400 UNIT capsule, Take 400 Units by mouth daily. , Disp: , Rfl:  EXAM:  VITALS per patient if applicable: 700F/25L  GENERAL: alert.  Sounds to be in no acute distress.  Answering questions appropriately.   MS: moves all visible extremities without noticeable abnormality  PSYCH/NEURO: pleasant and cooperative, no obvious depression or anxiety, speech and thought processing grossly intact  ASSESSMENT AND PLAN:  Discussed the following assessment and plan:  Essential hypertension, benign Blood pressure under good control.  Continue same medication regimen.  Follow pressures.  Follow metabolic panel.    Erythrocytosis Followed by hematology.  Improved with treatment of OSA.  Has f/u planned 07/26/19.    Abnormal liver function Previous abdominal  ultrasound negative.  Follow liver function tests.  Scheduled to be checked by Dr Tasia Catchings.   Hyperglycemia Low carb diet and exercise.  Follow met b and a1c.   History of colon cancer Colonoscopy 07/2016.  Recommended f/u in 3 years.  Should be receiving a notice soon.  Will notify me if any problems or if we need to refer.   Pure hypercholesterolemia On crestor.  Low cholesterol diet and exercise.  Follow lipid panel and liver function tests.    Sleep apnea Continue cpap.    Elevated hemoglobin ([place]) Worked up and followed by Dr Tasia Catchings.  Improved with treatment of sleep apnea.  Has f/u with Dr Tasia Catchings in 07/2019.     Orders Placed This Encounter  Procedures  . Hemoglobin A1c    Standing Status:   Future    Standing Expiration Date:   06/07/2020  . Lipid panel    Standing Status:   Future    Standing Expiration Date:   06/07/2020  . Basic metabolic panel    Standing Status:   Future    Standing Expiration Date:   06/07/2020  . TSH    Standing Status:   Future    Standing Expiration Date:   06/07/2020     I discussed the assessment and treatment plan with the patient. The patient was provided an opportunity to ask questions and all were answered. The patient agreed with the plan and demonstrated an understanding of the instructions.   The patient was advised to call back or seek an in-person evaluation if the symptoms worsen or if the condition fails to improve as anticipated.  I provided 17 minutes of non-face-to-face time during this encounter.   [name], MD

## 2019-06-08 NOTE — Assessment & Plan Note (Signed)
Colonoscopy 07/2016.  Recommended f/u in 3 years.  Should be receiving a notice soon.  Will notify me if any problems or if we need to refer.

## 2019-06-30 ENCOUNTER — Telehealth: Payer: Self-pay | Admitting: Internal Medicine

## 2019-06-30 DIAGNOSIS — Z85038 Personal history of other malignant neoplasm of large intestine: Secondary | ICD-10-CM

## 2019-06-30 DIAGNOSIS — Z1211 Encounter for screening for malignant neoplasm of colon: Secondary | ICD-10-CM

## 2019-06-30 NOTE — Telephone Encounter (Signed)
Pt called to let Dr. [name] know that she has not heard back about her colonoscopy since her provider retired

## 2019-07-01 NOTE — Telephone Encounter (Signed)
Order placed for GI referral.   

## 2019-07-01 NOTE — Addendum Note (Signed)
Addended by: Alisa Graff on: 07/01/2019 07:49 PM   Modules accepted: Orders

## 2019-07-01 NOTE — Telephone Encounter (Signed)
Per report from 2018, pt needs repeat colonoscopy in 3 years. Needs new referral to GI since her doctor retired.

## 2019-07-04 NOTE — Telephone Encounter (Signed)
Patient is aware 

## 2019-07-12 ENCOUNTER — Telehealth: Payer: Self-pay

## 2019-07-12 NOTE — Telephone Encounter (Signed)
Received call from pt, who had questions regarding upcoming appt.  Pt stated that she received a letter from Dr. Ashby Dawes stating that she was due for an appt.. Pt called number on letter and scheduled a visit for 08/2019.  Pt was last seen in our office by Dr. Halford Chessman on 04/2019 and is not due for another appt until 04/2020. Pt called in confused as to why she needed a sooner appt. I have spoken to pt and made her aware that Dr. Ashby Dawes is no longer with our practice. I also explained that Dr. Ashby Dawes only does virtual visits.  Pt stated that she would like to continue with Dr. Halford Chessman and she would call and cancel appt with Dr. Mathis Fare office.  Nothing further is needed.

## 2019-07-15 ENCOUNTER — Ambulatory Visit: Payer: BC Managed Care – PPO | Attending: Internal Medicine

## 2019-07-15 DIAGNOSIS — Z23 Encounter for immunization: Secondary | ICD-10-CM

## 2019-07-15 NOTE — Progress Notes (Signed)
   Covid-19 Vaccination Clinic  Name:  KAMBERLYN LECHLER    MRN: [id number] DOB: 1951/02/03  07/15/2019  Ms. Whisenant was observed post Covid-19 immunization for 15 minutes without incident. She was provided with Vaccine Information Sheet and instruction to access the V-Safe system.   Ms. Teets was instructed to call 911 with any severe reactions post vaccine: [name] Difficulty breathing  . Swelling of face and throat  . A fast heartbeat  . A bad rash all over body  . Dizziness and weakness   Immunizations Administered    Name Date Dose VIS Date Route   Pfizer COVID-19 Vaccine 07/15/2019  9:00 AM 0.3 mL 04/15/2019 Intramuscular   Manufacturer: [place]   Lot: [id number]   [place]: [id number]

## 2019-07-18 ENCOUNTER — Other Ambulatory Visit: Payer: Self-pay | Admitting: Internal Medicine

## 2019-07-21 ENCOUNTER — Other Ambulatory Visit: Payer: Self-pay

## 2019-07-21 ENCOUNTER — Other Ambulatory Visit (INDEPENDENT_AMBULATORY_CARE_PROVIDER_SITE_OTHER): Payer: BC Managed Care – PPO

## 2019-07-21 DIAGNOSIS — R739 Hyperglycemia, unspecified: Secondary | ICD-10-CM | POA: Diagnosis not present

## 2019-07-21 DIAGNOSIS — E7800 Pure hypercholesterolemia, unspecified: Secondary | ICD-10-CM | POA: Diagnosis not present

## 2019-07-21 DIAGNOSIS — I10 Essential (primary) hypertension: Secondary | ICD-10-CM

## 2019-07-21 LAB — LIPID PANEL
Cholesterol: 128 mg/dL (ref 0–200)
HDL: 54.3 mg/dL (ref >39.00)
LDL Cholesterol: 62 mg/dL (ref 0–99)
NonHDL: 73.93
Total CHOL/HDL Ratio: 2
Triglycerides: 60 mg/dL (ref 0.0–149.0)
VLDL: 12 mg/dL (ref 0.0–40.0)

## 2019-07-21 LAB — BASIC METABOLIC PANEL
BUN: 18 mg/dL (ref 6–23)
CO2: 30 mEq/L (ref 19–32)
Calcium: 9.4 mg/dL (ref 8.4–10.5)
Chloride: 105 mEq/L (ref 96–112)
Creatinine, Ser: 0.9 mg/dL (ref 0.40–1.20)
GFR: 74.97 mL/min (ref >60.00)
Glucose, Bld: 96 mg/dL (ref 70–99)
Potassium: 4.3 mEq/L (ref 3.5–5.1)
Sodium: 138 mEq/L (ref 135–145)

## 2019-07-21 LAB — TSH: TSH: 1.14 u[IU]/mL (ref 0.35–4.50)

## 2019-07-21 LAB — HEMOGLOBIN A1C: Hgb A1c MFr Bld: 5.7 % (ref 4.6–6.5)

## 2019-07-22 ENCOUNTER — Other Ambulatory Visit: Payer: Self-pay | Admitting: Internal Medicine

## 2019-07-22 ENCOUNTER — Other Ambulatory Visit (INDEPENDENT_AMBULATORY_CARE_PROVIDER_SITE_OTHER): Payer: BC Managed Care – PPO

## 2019-07-22 DIAGNOSIS — E7800 Pure hypercholesterolemia, unspecified: Secondary | ICD-10-CM

## 2019-07-22 LAB — HEPATIC FUNCTION PANEL
ALT: 33 U/L (ref 0–35)
AST: 22 U/L (ref 0–37)
Albumin: 4.2 g/dL (ref 3.5–5.2)
Alkaline Phosphatase: 49 U/L (ref 39–117)
Bilirubin, Direct: 0.1 mg/dL (ref 0.0–0.3)
Total Bilirubin: 0.7 mg/dL (ref 0.2–1.2)
Total Protein: 6.9 g/dL (ref 6.0–8.3)

## 2019-07-22 NOTE — Progress Notes (Signed)
Order placed for add on lab.  °

## 2019-07-26 ENCOUNTER — Encounter: Payer: Self-pay | Admitting: Oncology

## 2019-07-26 ENCOUNTER — Inpatient Hospital Stay: Payer: BC Managed Care – PPO | Attending: Oncology | Admitting: Oncology

## 2019-07-26 ENCOUNTER — Inpatient Hospital Stay: Payer: BC Managed Care – PPO | Attending: Oncology

## 2019-07-26 VITALS — BP 133/79 | HR 76 | Temp 96.8°F | Resp 18 | Wt 179.2 lb

## 2019-07-26 DIAGNOSIS — Z7982 Long term (current) use of aspirin: Secondary | ICD-10-CM | POA: Diagnosis not present

## 2019-07-26 DIAGNOSIS — Z87891 Personal history of nicotine dependence: Secondary | ICD-10-CM | POA: Insufficient documentation

## 2019-07-26 DIAGNOSIS — Z8042 Family history of malignant neoplasm of prostate: Secondary | ICD-10-CM | POA: Insufficient documentation

## 2019-07-26 DIAGNOSIS — Z85038 Personal history of other malignant neoplasm of large intestine: Secondary | ICD-10-CM

## 2019-07-26 DIAGNOSIS — D751 Secondary polycythemia: Secondary | ICD-10-CM | POA: Insufficient documentation

## 2019-07-26 DIAGNOSIS — G4730 Sleep apnea, unspecified: Secondary | ICD-10-CM | POA: Insufficient documentation

## 2019-07-26 DIAGNOSIS — Z823 Family history of stroke: Secondary | ICD-10-CM | POA: Diagnosis not present

## 2019-07-26 DIAGNOSIS — Z79899 Other long term (current) drug therapy: Secondary | ICD-10-CM | POA: Diagnosis not present

## 2019-07-26 DIAGNOSIS — I10 Essential (primary) hypertension: Secondary | ICD-10-CM | POA: Insufficient documentation

## 2019-07-26 DIAGNOSIS — E785 Hyperlipidemia, unspecified: Secondary | ICD-10-CM | POA: Insufficient documentation

## 2019-07-26 DIAGNOSIS — Z90710 Acquired absence of both cervix and uterus: Secondary | ICD-10-CM | POA: Diagnosis not present

## 2019-07-26 DIAGNOSIS — Z8249 Family history of ischemic heart disease and other diseases of the circulatory system: Secondary | ICD-10-CM | POA: Insufficient documentation

## 2019-07-26 LAB — CBC WITH DIFFERENTIAL/PLATELET
Abs Immature Granulocytes: 0.01 10*3/uL (ref 0.00–0.07)
Basophils Absolute: 0 10*3/uL (ref 0.0–0.1)
Basophils Relative: 1 %
Eosinophils Absolute: 0.1 10*3/uL (ref 0.0–0.5)
Eosinophils Relative: 3 %
HCT: 45 % (ref 36.0–46.0)
Hemoglobin: 14.8 g/dL (ref 12.0–15.0)
Immature Granulocytes: 0 %
Lymphocytes Relative: 42 %
Lymphs Abs: 1.5 10*3/uL (ref 0.7–4.0)
MCH: 28.3 pg (ref 26.0–34.0)
MCHC: 32.9 g/dL (ref 30.0–36.0)
MCV: 86 fL (ref 80.0–100.0)
Monocytes Absolute: 0.3 10*3/uL (ref 0.1–1.0)
Monocytes Relative: 7 %
Neutro Abs: 1.7 10*3/uL (ref 1.7–7.7)
Neutrophils Relative %: 47 %
Platelets: 209 10*3/uL (ref 150–400)
RBC: 5.23 MIL/uL — ABNORMAL HIGH (ref 3.87–5.11)
RDW: 13.2 % (ref 11.5–15.5)
WBC: 3.5 10*3/uL — ABNORMAL LOW (ref 4.0–10.5)
nRBC: 0 % (ref 0.0–0.2)

## 2019-07-26 LAB — COMPREHENSIVE METABOLIC PANEL
ALT: 34 U/L (ref 0–44)
AST: 25 U/L (ref 15–41)
Albumin: 4.6 g/dL (ref 3.5–5.0)
Alkaline Phosphatase: 48 U/L (ref 38–126)
Anion gap: 8 (ref 5–15)
BUN: 16 mg/dL (ref 8–23)
CO2: 27 mmol/L (ref 22–32)
Calcium: 9.1 mg/dL (ref 8.9–10.3)
Chloride: 104 mmol/L (ref 98–111)
Creatinine, Ser: 0.82 mg/dL (ref 0.44–1.00)
GFR calc Af Amer: >60 mL/min (ref >60)
GFR calc non Af Amer: >60 mL/min (ref >60)
Glucose, Bld: 89 mg/dL (ref 70–99)
Potassium: 4.1 mmol/L (ref 3.5–5.1)
Sodium: 139 mmol/L (ref 135–145)
Total Bilirubin: 0.9 mg/dL (ref 0.3–1.2)
Total Protein: 7.3 g/dL (ref 6.5–8.1)

## 2019-07-26 NOTE — Progress Notes (Signed)
Patient does not offer any problems today.  

## 2019-07-26 NOTE — Progress Notes (Signed)
Hematology/Oncology follow up note [name] [phone] [phone]   Patient Care Team: [name], MD as PCP - General (Internal Medicine)  REFERRING PROVIDER: [name], MD  REASON FOR VISIT Follow up for treatment of erythrocytosis :   HISTORY OF PRESENTING ILLNESS:  Susan Weeks is a  69 y.o.  female with PMH listed below who was referred to me for evaluation of elevated hemoglobin.  Recent hemoglobin 16.2, hct 47.7, reviewed her previous labs, hemoglobin has been chronically high since 2017, ranging from 15.1 to 16.2.   Pertinent Oncology Histroy  Previous history of carcinoma of colon status post resection, has an intermittent rising CEA.  Status post resection, no evidence of malignancy.-2011 Patient had exploratory laparotomy because of abnormal PET scan following rising CEA and was negative for any malignancy. CEA  remains stable in the range of 5.0   Patient reports feeling well at baseline. She was former smoker. No second hand smoking.  Denies any family history of high hemoglobin, DVT. No personal history of DVT  #Erythrocytosis was thought to be secondary, negative for Jak 2 mutation with reflex, BCR ABL mutation negative. # Sleep Apnea, patient establish care with Dr. Hector Shade.  Started on CPAP machine.  INTERVAL HISTORY Susan Weeks is a 69 y.o. female who has above history reviewed by me today presents for follow-up visit for management of erythrocytosis, history of colon cancer. Patient reports doing well She uses CPAP machine at home.  No new complaints.     Review of Systems  Constitutional: Negative for chills, fever, malaise/fatigue and weight loss.  HENT: Negative for sore throat.   Eyes: Negative for redness.  Respiratory: Negative for cough, shortness of breath and wheezing.   Cardiovascular: Negative for chest pain, palpitations and leg swelling.  Gastrointestinal: Negative for  abdominal pain, blood in stool, nausea and vomiting.  Genitourinary: Negative for dysuria.  Musculoskeletal: Negative for myalgias.  Skin: Negative for rash.  Neurological: Negative for dizziness, tingling and tremors.  Endo/Heme/Allergies: Does not bruise/bleed easily.  Psychiatric/Behavioral: Negative for hallucinations.    MEDICAL HISTORY:  Past Medical History:  Diagnosis Date  . Abnormal liver function   . Colon cancer ([place])    adenocarcinoma of the cecum s/p partial colon resection  . History of colon polyps   . Hyperlipidemia   . Hypertension     SURGICAL HISTORY: Past Surgical History:  Procedure Laterality Date  . ABDOMINAL HYSTERECTOMY  1984   secondary to bleeding and fibroid tumors  . APPENDECTOMY    . COLONOSCOPY W/ POLYPECTOMY  2001  . COLONOSCOPY WITH PROPOFOL N/A 07/28/2016   Procedure: COLONOSCOPY WITH PROPOFOL;  Surgeon: [name], MD;  Location: [facility] ENDOSCOPY;  Service: Endoscopy;  Laterality: N/A;    SOCIAL HISTORY: Social History   Socioeconomic History  . Marital status: Married    Spouse name: [name]  . Number of children: 1  . Years of education: HS  . Highest education level: Not on file  Occupational History    Employer: OTHER    Comment: [employer]  Tobacco Use  . Smoking status: Former Research scientist (life sciences)  . Smokeless tobacco: Never Used  Substance and Sexual Activity  . Alcohol use: No    Alcohol/week: 0.0 standard drinks  . Drug use: No  . Sexual activity: Not on file  Other Topics Concern  . Not on file  Social History Narrative  . Not on file   Social Determinants of Health   Financial Resource  Strain:   . Difficulty of Paying Living Expenses:   Food Insecurity:   . Worried About Charity fundraiser in the Last Year:   . Arboriculturist in the Last Year:   Transportation Needs:   . Film/video editor (Medical):   [name] Lack of Transportation (Non-Medical):   Physical Activity:   . Days of Exercise per Week:   .  Minutes of Exercise per Session:   Stress:   . Feeling of Stress :   Social Connections:   . Frequency of Communication with Friends and Family:   . Frequency of Social Gatherings with Friends and Family:   . Attends Religious Services:   . Active Member of Clubs or Organizations:   . Attends Archivist Meetings:   [name] Marital Status:   Intimate Partner Violence:   . Fear of Current or Ex-Partner:   . Emotionally Abused:   [name] Physically Abused:   . Sexually Abused:     FAMILY HISTORY: Family History  Problem Relation Age of Onset  . Stroke Mother   . Hypertension Mother   . Cancer Father        Prostate   . Heart disease Brother        myocardial infarction  . Hypertension Brother   . Hypertension Sister        x4  . Breast cancer Neg Hx     ALLERGIES:  is allergic to z-pak [azithromycin].  MEDICATIONS:  Current Outpatient Medications  Medication Sig Dispense Refill  . amLODipine (NORVASC) 5 MG tablet Take 1 tablet (5 mg total) by mouth daily. 90 tablet 1  . aspirin EC 81 MG tablet Take 1 tablet (81 mg total) by mouth daily. 90 tablet 3  . calcium carbonate (OS-CAL) 600 MG TABS Take 600 mg by mouth daily.     [name] lisinopril (ZESTRIL) 30 MG tablet TAKE 1 TABLET BY MOUTH EVERY DAY 90 tablet 0  . rosuvastatin (CRESTOR) 5 MG tablet TAKE 1 TABLET BY MOUTH EVERY DAY 90 tablet 3  . vitamin E 400 UNIT capsule Take 400 Units by mouth daily.      No current facility-administered medications for this visit.     PHYSICAL EXAMINATION: ECOG PERFORMANCE STATUS: 0 - Asymptomatic Vitals:   07/26/19 0922  BP: 133/79  Pulse: 76  Resp: 18  Temp: (!) 96.8 F (36 C)   Filed Weights   07/26/19 0922  Weight: 179 lb 3.2 oz (81.3 kg)    Physical Exam Constitutional:      General: She is not in acute distress. HENT:     Head: Normocephalic and atraumatic.  Eyes:     General: No scleral icterus. Cardiovascular:     Rate and Rhythm: Normal rate and regular rhythm.      Heart sounds: Normal heart sounds.  Pulmonary:     Effort: Pulmonary effort is normal. No respiratory distress.     Breath sounds: No wheezing.  Abdominal:     General: Bowel sounds are normal. There is no distension.     Palpations: Abdomen is soft.  Musculoskeletal:        General: No deformity. Normal range of motion.     Cervical back: Normal range of motion and neck supple.  Skin:    General: Skin is warm and dry.     Findings: No erythema or rash.  Neurological:     Mental Status: She is alert and oriented to person, place, and time. Mental status  is at baseline.     Cranial Nerves: No cranial nerve deficit.     Coordination: Coordination normal.  Psychiatric:        Mood and Affect: Mood normal.      LABORATORY DATA:  I have reviewed the data as listed Lab Results  Component Value Date   WBC 3.5 (L) 07/26/2019   HGB 14.8 07/26/2019   HCT 45.0 07/26/2019   MCV 86.0 07/26/2019   PLT 209 07/26/2019   Recent Labs    10/01/18 1326 10/01/18 1326 10/12/18 0803 10/12/18 0803 01/27/19 1019 07/21/19 0844 07/22/19 0953 07/26/19 0908  NA 141   < > 138   < > 138 138  --  139  K 4.4   < > 4.2   < > 4.0 4.3  --  4.1  CL 104   < > 102   < > 103 105  --  104  CO2 30   < > 30   < > 29 30  --  27  GLUCOSE 75   < > 97   < > 95 96  --  89  BUN 19   < > 19   < > 21 18  --  16  CREATININE 1.03*   < > 0.85   < > 0.80 0.90  --  0.82  CALCIUM 9.2   < > 9.2   < > 9.5 9.4  --  9.1  GFRNONAA 56*  --   --   --  >60  --   --  >60  GFRAA >60  --   --   --  >60  --   --  >60  PROT 7.5   < > 6.8   < > 7.1  --  6.9 7.3  ALBUMIN 4.5   < > 4.3   < > 4.6  --  4.2 4.6  AST 43*   < > 24   < > 22  --  22 25  ALT 81*   < > 49*   < > 30  --  33 34  ALKPHOS 68   < > 63   < > 47  --  49 48  BILITOT 0.7   < > 0.8   < > 0.8  --  0.7 0.9  BILIDIR  --   --  0.2  --   --   --  0.1  --    < > = values in this interval not displayed.       ASSESSMENT & PLAN:  1. History of colon cancer   2.  Erythrocytosis    Labs are reviewed and discussed with patient. Secondary erythrocytosis due to sleep apnea. Hemoglobin has improved since she started CPAP.   History of colon cancer, advise patient to follow up with [facility]. She is due for a repeat colonoscopy.  CEA is pending.   Orders Placed This Encounter  Procedures  . CBC with Differential/Platelet    Standing Status:   Future    Standing Expiration Date:   07/25/2020  . Comprehensive metabolic panel    Standing Status:   Future    Standing Expiration Date:   07/25/2020  . CEA    Standing Status:   Future    Standing Expiration Date:   07/25/2020    Return of visit  1 year  Earlie Server, MD, PhD Hematology Oncology [facility] Pager- [phone] 07/26/2019

## 2019-07-27 LAB — CEA: CEA: 4.9 ng/mL — ABNORMAL HIGH (ref 0.0–4.7)

## 2019-08-09 ENCOUNTER — Ambulatory Visit: Payer: BC Managed Care – PPO | Attending: Internal Medicine

## 2019-08-09 DIAGNOSIS — Z23 Encounter for immunization: Secondary | ICD-10-CM

## 2019-08-09 NOTE — Progress Notes (Signed)
   Covid-19 Vaccination Clinic  Name:  MELADIE ING    MRN: [id number] DOB: 1950/08/13  08/09/2019  Ms. Covault was observed post Covid-19 immunization for 15 minutes without incident. She was provided with Vaccine Information Sheet and instruction to access the V-Safe system.   Ms. Heidebrink was instructed to call 911 with any severe reactions post vaccine: [name] Difficulty breathing  . Swelling of face and throat  . A fast heartbeat  . A bad rash all over body  . Dizziness and weakness   Immunizations Administered    Name Date Dose VIS Date Route   Pfizer COVID-19 Vaccine 08/09/2019  8:46 AM 0.3 mL 04/15/2019 Intramuscular   Manufacturer: Delta   Lot: [id number]   [place]: [id number]

## 2019-09-01 ENCOUNTER — Encounter: Payer: Self-pay | Admitting: Emergency Medicine

## 2019-09-01 ENCOUNTER — Other Ambulatory Visit: Payer: Self-pay

## 2019-09-01 ENCOUNTER — Ambulatory Visit
Admission: EM | Admit: 2019-09-01 | Discharge: 2019-09-01 | Disposition: A | Discharge status: Home / Self Care | Payer: BC Managed Care – PPO | Attending: Emergency Medicine | Admitting: Emergency Medicine

## 2019-09-01 DIAGNOSIS — B349 Viral infection, unspecified: Secondary | ICD-10-CM

## 2019-09-01 LAB — POCT INFLUENZA A/B
Influenza A, POC: NEGATIVE
Influenza B, POC: NEGATIVE

## 2019-09-01 LAB — POC SARS CORONAVIRUS 2 AG -  ED: SARS Coronavirus 2 Ag: NEGATIVE

## 2019-09-01 NOTE — ED Provider Notes (Signed)
[facility]    CSN: [id number] Arrival date & time: 09/01/19  1135      History   Chief Complaint Chief Complaint  Patient presents with  . arms are cold  . Diarrhea    HPI Susan Weeks is a 69 y.o. female.   Patient presents with 3-day history of chills and diarrhea.  1 episode of diarrhea today.  No fever noted at home but she is 101.1 here.  She has been treating her symptoms with Tylenol.  She denies sore throat, congestion, cough, shortness of breath, vomiting, rash, or other symptoms.    The history is provided by the patient.    Past Medical History:  Diagnosis Date  . Abnormal liver function   . Colon cancer ([place])    adenocarcinoma of the cecum s/p partial colon resection  . History of colon polyps   . Hyperlipidemia   . Hypertension     Patient Active Problem List   Diagnosis Date Noted  . Elevated hemoglobin ([place]) 06/08/2019  . Toe infection 09/26/2018  . Erythrocytosis 01/24/2018  . Sleep apnea 01/24/2018  . Hyperglycemia 02/25/2015  . Health care maintenance 08/23/2014  . Rash and nonspecific skin eruption 06/12/2014  . Thigh cramp 02/12/2014  . Pain of right clavicle 04/10/2013  . History of colon cancer 02/15/2013  . Essential hypertension, benign 07/11/2012  . Pure hypercholesterolemia 07/11/2012  . Abnormal liver function 07/11/2012  . Leukopenia 07/11/2012    Past Surgical History:  Procedure Laterality Date  . ABDOMINAL HYSTERECTOMY  1984   secondary to bleeding and fibroid tumors  . APPENDECTOMY    . COLONOSCOPY W/ POLYPECTOMY  2001  . COLONOSCOPY WITH PROPOFOL N/A 07/28/2016   Procedure: COLONOSCOPY WITH PROPOFOL;  Surgeon: [name], MD;  Location[facility]COPY;  Service: Endoscopy;  Laterality: N/A;    OB History   No obstetric history on file.      Home Medications    Prior to Admission medications   Medication Sig Start Date End Date Taking? Authorizing Provider  amLODipine (NORVASC) 5 MG tablet Take  1 tablet (5 mg total) by mouth daily. 12/21/18  Yes [name], MD  aspirin EC 81 MG tablet Take 1 tablet (81 mg total) by mouth daily. 10/16/17  Yes Earlie Server, MD  calcium carbonate (OS-CAL) 600 MG TABS Take 600 mg by mouth daily.    Yes [provider]  lisinopril (ZESTRIL) 30 MG tablet TAKE 1 TABLET BY MOUTH EVERY DAY 07/18/19  Yes [name], MD  rosuvastatin (CRESTOR) 5 MG tablet TAKE 1 TABLET BY MOUTH EVERY DAY 04/20/19  Yes Crecencio Mc, MD  vitamin E 400 UNIT capsule Take 400 Units by mouth daily.    Yes [provider]    Family History Family History  Problem Relation Age of Onset  . Stroke Mother   . Hypertension Mother   . Cancer Father        Prostate   . Heart disease Brother        myocardial infarction  . Hypertension Brother   . Hypertension Sister        x4  . Breast cancer Neg Hx     Social History Social History   Tobacco Use  . Smoking status: Former Smoker    Quit date: 1978    Years since quitting: 43.3  . Smokeless tobacco: Never Used  Substance Use Topics  . Alcohol use: No    Alcohol/week: 0.0 standard drinks  .  Drug use: No     Allergies   Z-pak [azithromycin]   Review of Systems Review of Systems  Constitutional: Positive for chills.  HENT: Negative for congestion, ear pain and sore throat.   Eyes: Negative for pain and visual disturbance.  Respiratory: Negative for cough and shortness of breath.   Cardiovascular: Negative for chest pain and palpitations.  Gastrointestinal: Positive for diarrhea. Negative for abdominal pain, nausea and vomiting.  Genitourinary: Negative for dysuria and hematuria.  Musculoskeletal: Negative for arthralgias and back pain.  Skin: Negative for color change and rash.  Neurological: Negative for seizures and syncope.  All other systems reviewed and are negative.    Physical Exam Triage Vital Signs ED Triage Vitals  Enc Vitals Group     BP 09/01/19 1139 136/73     Pulse  Rate 09/01/19 1139 96     Resp 09/01/19 1139 18     Temp 09/01/19 1139 (!) 101.1 F (38.4 C)     Temp Source 09/01/19 1139 Oral     SpO2 09/01/19 1139 96 %     Weight 09/01/19 1140 170 lb (77.1 kg)     Height 09/01/19 1140 5\' 3"  (1.6 m)     Head Circumference --      Peak Flow --      Pain Score 09/01/19 1140 0     Pain Loc --      Pain Edu? --      Excl. in [place]? --    No data found.  Updated Vital Signs BP 136/73 (BP Location: Right Arm)   Pulse 96   Temp (!) 101.1 F (38.4 C) (Oral)   Resp 18   Ht 5\' 3"  (1.6 m)   Wt 170 lb (77.1 kg)   LMP 06/28/1982   SpO2 96%   BMI 30.11 kg/m   Visual Acuity Right Eye Distance:   Left Eye Distance:   Bilateral Distance:    Right Eye Near:   Left Eye Near:    Bilateral Near:     Physical Exam Vitals and nursing note reviewed.  Constitutional:      General: She is not in acute distress.    Appearance: She is well-developed.  HENT:     Head: Normocephalic and atraumatic.     Right Ear: Tympanic membrane normal.     Left Ear: Tympanic membrane normal.     Nose: Nose normal.     Mouth/Throat:     Mouth: Mucous membranes are moist.     Pharynx: Oropharynx is clear.  Eyes:     Conjunctiva/sclera: Conjunctivae normal.  Cardiovascular:     Rate and Rhythm: Normal rate and regular rhythm.     Heart sounds: No murmur.  Pulmonary:     Effort: Pulmonary effort is normal. No respiratory distress.     Breath sounds: Normal breath sounds.  Abdominal:     General: Bowel sounds are normal.     Palpations: Abdomen is soft.     Tenderness: There is no abdominal tenderness. There is no guarding or rebound.  Musculoskeletal:     Cervical back: Neck supple.  Skin:    General: Skin is warm and dry.     Findings: No rash.  Neurological:     General: No focal deficit present.     Mental Status: She is alert and oriented to person, place, and time.     Gait: Gait normal.  Psychiatric:        Mood and Affect: Mood normal.  Behavior: Behavior normal.      UC Treatments / Results  Labs (all labs ordered are listed, but only abnormal results are displayed) Labs Reviewed  NOVEL CORONAVIRUS, NAA  POCT INFLUENZA A/B  POC SARS CORONAVIRUS 2 AG -  ED    EKG   Radiology No results found.  Procedures Procedures (including critical care time)  Medications Ordered in UC Medications - No data to display  Initial Impression / Assessment and Plan / UC Course  I have reviewed the triage vital signs and the nursing notes.  Pertinent labs & imaging results that were available during my care of the patient were reviewed by me and considered in my medical decision making (see chart for details).   Viral Illness. Rapid flu negative.  POC COVID negative; PCR pending.  Instructed patient to self quarantine until the test result is back and to take Tylenol as needed for fever/discomfort.  Discussed rest and hydration.  Instructed patient to go to the emergency department if she develops high fever, shortness of breath, severe diarrhea, or other concerning symptoms.  Patient agrees with plan of care.    Final Clinical Impressions(s) / UC Diagnoses   Final diagnoses:  Viral illness     Discharge Instructions     Your rapid flu tests were negative.    Your rapid COVID test is negative; the send-out test is pending.  You should self quarantine until your test result is back and is negative.    Take Tylenol as needed for fever or discomfort.  Rest and stay hydrated.    Go to the emergency department if you develop high fever, shortness of breath, severe diarrhea, or other concerning symptoms.        ED Prescriptions    None     PDMP not reviewed this encounter.   Sharion Balloon, NP 09/01/19 1228

## 2019-09-01 NOTE — ED Triage Notes (Signed)
Patient in today c/o arms being cold and diarrhea x 3 days. Patient denies fever.

## 2019-09-01 NOTE — ED Triage Notes (Signed)
Patient has had both Pfizer covid vaccines. She received her last one in April 6.

## 2019-09-01 NOTE — Discharge Instructions (Addendum)
Your rapid flu tests were negative.    Your rapid COVID test is negative; the send-out test is pending.  You should self quarantine until your test result is back and is negative.    Take Tylenol as needed for fever or discomfort.  Rest and stay hydrated.    Go to the emergency department if you develop high fever, shortness of breath, severe diarrhea, or other concerning symptoms.

## 2019-09-03 LAB — NOVEL CORONAVIRUS, NAA: SARS-CoV-2, NAA: NOT DETECTED

## 2019-09-03 LAB — SARS-COV-2, NAA 2 DAY TAT

## 2019-09-23 ENCOUNTER — Other Ambulatory Visit: Payer: Self-pay | Admitting: Internal Medicine

## 2019-10-18 ENCOUNTER — Ambulatory Visit: Payer: BC Managed Care – PPO | Admitting: Internal Medicine

## 2019-10-24 ENCOUNTER — Telehealth: Payer: Self-pay | Admitting: Internal Medicine

## 2019-10-24 DIAGNOSIS — Z1231 Encounter for screening mammogram for malignant neoplasm of breast: Secondary | ICD-10-CM

## 2019-10-24 NOTE — Telephone Encounter (Signed)
Pt called and stated that she need a order for mammogram.

## 2019-10-24 NOTE — Telephone Encounter (Signed)
Order placed for mammogram. Pt aware

## 2019-10-25 LAB — HM COLONOSCOPY

## 2019-11-04 ENCOUNTER — Other Ambulatory Visit: Payer: Self-pay

## 2019-11-04 ENCOUNTER — Ambulatory Visit (INDEPENDENT_AMBULATORY_CARE_PROVIDER_SITE_OTHER): Payer: BC Managed Care – PPO | Admitting: Internal Medicine

## 2019-11-04 ENCOUNTER — Encounter: Payer: Self-pay | Admitting: Internal Medicine

## 2019-11-04 VITALS — BP 128/78 | HR 79 | Temp 97.6°F | Resp 16 | Ht 63.0 in | Wt 179.0 lb

## 2019-11-04 DIAGNOSIS — D582 Other hemoglobinopathies: Secondary | ICD-10-CM

## 2019-11-04 DIAGNOSIS — D72819 Decreased white blood cell count, unspecified: Secondary | ICD-10-CM

## 2019-11-04 DIAGNOSIS — R739 Hyperglycemia, unspecified: Secondary | ICD-10-CM

## 2019-11-04 DIAGNOSIS — E7800 Pure hypercholesterolemia, unspecified: Secondary | ICD-10-CM | POA: Diagnosis not present

## 2019-11-04 DIAGNOSIS — Z85038 Personal history of other malignant neoplasm of large intestine: Secondary | ICD-10-CM

## 2019-11-04 DIAGNOSIS — Z0000 Encounter for general adult medical examination without abnormal findings: Secondary | ICD-10-CM

## 2019-11-04 DIAGNOSIS — E2839 Other primary ovarian failure: Secondary | ICD-10-CM | POA: Diagnosis not present

## 2019-11-04 DIAGNOSIS — I10 Essential (primary) hypertension: Secondary | ICD-10-CM | POA: Diagnosis not present

## 2019-11-04 DIAGNOSIS — G4730 Sleep apnea, unspecified: Secondary | ICD-10-CM

## 2019-11-04 DIAGNOSIS — R945 Abnormal results of liver function studies: Secondary | ICD-10-CM

## 2019-11-04 LAB — LIPID PANEL
Cholesterol: 164 mg/dL (ref 0–200)
HDL: 64.6 mg/dL (ref >39.00)
LDL Cholesterol: 83 mg/dL (ref 0–99)
NonHDL: 99.84
Total CHOL/HDL Ratio: 3
Triglycerides: 86 mg/dL (ref 0.0–149.0)
VLDL: 17.2 mg/dL (ref 0.0–40.0)

## 2019-11-04 LAB — HEPATIC FUNCTION PANEL
ALT: 37 U/L — ABNORMAL HIGH (ref 0–35)
AST: 25 U/L (ref 0–37)
Albumin: 4.7 g/dL (ref 3.5–5.2)
Alkaline Phosphatase: 57 U/L (ref 39–117)
Bilirubin, Direct: 0.2 mg/dL (ref 0.0–0.3)
Total Bilirubin: 0.9 mg/dL (ref 0.2–1.2)
Total Protein: 7.2 g/dL (ref 6.0–8.3)

## 2019-11-04 LAB — BASIC METABOLIC PANEL
BUN: 19 mg/dL (ref 6–23)
CO2: 31 mEq/L (ref 19–32)
Calcium: 9.9 mg/dL (ref 8.4–10.5)
Chloride: 102 mEq/L (ref 96–112)
Creatinine, Ser: 0.92 mg/dL (ref 0.40–1.20)
GFR: 73.03 mL/min (ref >60.00)
Glucose, Bld: 94 mg/dL (ref 70–99)
Potassium: 4.2 mEq/L (ref 3.5–5.1)
Sodium: 138 mEq/L (ref 135–145)

## 2019-11-04 LAB — HEMOGLOBIN A1C: Hgb A1c MFr Bld: 5.8 % (ref 4.6–6.5)

## 2019-11-04 NOTE — Progress Notes (Signed)
[name]: Susan Weeks, female   DOB: 04/06/1951, 69 y.o.   MRN: [id number]   Subjective:    [name]: Susan Weeks, female    DOB: 05-07-1950, 69 y.o.   MRN: [id number]  HPI This visit occurred during the SARS-CoV-2 public health emergency.  Safety protocols were in place, including screening questions prior to the visit, additional usage of staff PPE, and extensive cleaning of exam room while observing appropriate contact time as indicated for disinfecting solutions.  Patient here for her physical exam.  She is doing well.  Feels good.  Has not been exercising or watching her diet as well lately. Plans to start.  Discussed diet and exercise.  Has a history of colon cancer.  Just had her colonoscopy in 10/2019. States ok.  Recommended f/u in 3 years.  Will obtain copy.  No chest pain or sob.  No acid reflux.  No abdominal pain.  Bowels moving.  Overall feels good.  Discussed bone density.    Past Medical History:  Diagnosis Date  . Abnormal liver function   . Colon cancer ([place])    adenocarcinoma of the cecum s/p partial colon resection  . History of colon polyps   . Hyperlipidemia   . Hypertension    Past Surgical History:  Procedure Laterality Date  . ABDOMINAL HYSTERECTOMY  1984   secondary to bleeding and fibroid tumors  . APPENDECTOMY    . COLONOSCOPY W/ POLYPECTOMY  2001  . COLONOSCOPY WITH PROPOFOL N/A 07/28/2016   Procedure: COLONOSCOPY WITH PROPOFOL;  Surgeon: [name], MD;  Location: [facility] ENDOSCOPY;  Service: Endoscopy;  Laterality: N/A;   Family History  Problem Relation Age of Onset  . Stroke Mother   . Hypertension Mother   . Cancer Father        Prostate   . Heart disease Brother        myocardial infarction  . Hypertension Brother   . Hypertension Sister        x4  . Breast cancer Neg Hx    Social History   Socioeconomic History  . Marital status: Married    Spouse name: [name]  . Number of children: 1  . Years of education: HS  . Highest  education level: Not on file  Occupational History    Employer: OTHER    Comment: [employer]  Tobacco Use  . Smoking status: Former Smoker    Quit date: 1978    Years since quitting: 43.5  . Smokeless tobacco: Never Used  Vaping Use  . Vaping Use: Never used  Substance and Sexual Activity  . Alcohol use: No    Alcohol/week: 0.0 standard drinks  . Drug use: No  . Sexual activity: Not on file  Other Topics Concern  . Not on file  Social History Narrative  . Not on file   Social Determinants of Health   Financial Resource Strain:   . Difficulty of Paying Living Expenses:   Food Insecurity:   . Worried About Charity fundraiser in the Last Year:   . Arboriculturist in the Last Year:   Transportation Needs:   . Film/video editor (Medical):   [name] Lack of Transportation (Non-Medical):   Physical Activity:   . Days of Exercise per Week:   . Minutes of Exercise per Session:   Stress:   . Feeling of Stress :   Social Connections:   . Frequency of Communication with Friends and Family:   .  Frequency of Social Gatherings with Friends and Family:   . Attends Religious Services:   . Active Member of Clubs or Organizations:   . Attends Archivist Meetings:   [name] Marital Status:     Outpatient Encounter Medications as of 11/04/2019  Medication Sig  . amLODipine (NORVASC) 5 MG tablet TAKE 1 TABLET BY MOUTH EVERY DAY  . aspirin EC 81 MG tablet Take 1 tablet (81 mg total) by mouth daily.  . calcium carbonate (OS-CAL) 600 MG TABS Take 600 mg by mouth daily.   [name] lisinopril (ZESTRIL) 30 MG tablet TAKE 1 TABLET BY MOUTH EVERY DAY  . rosuvastatin (CRESTOR) 5 MG tablet TAKE 1 TABLET BY MOUTH EVERY DAY  . vitamin E 400 UNIT capsule Take 400 Units by mouth daily.    No facility-administered encounter medications on file as of 11/04/2019.    Review of Systems  Constitutional: Negative for appetite change and unexpected weight change.  HENT: Negative for congestion and  sinus pressure.   Respiratory: Negative for cough, chest tightness and shortness of breath.   Cardiovascular: Negative for chest pain, palpitations and leg swelling.  Gastrointestinal: Negative for abdominal pain, diarrhea, nausea and vomiting.  Genitourinary: Negative for difficulty urinating and dysuria.  Musculoskeletal: Negative for joint swelling and myalgias.  Skin: Negative for color change and rash.  Neurological: Negative for dizziness, light-headedness and headaches.  Psychiatric/Behavioral: Negative for agitation and dysphoric mood.       Objective:    Physical Exam Vitals reviewed.  Constitutional:      General: She is not in acute distress.    Appearance: Normal appearance. She is well-developed.  HENT:     Right Ear: External ear normal.     Left Ear: External ear normal.  Eyes:     General: No scleral icterus.       Right eye: No discharge.        Left eye: No discharge.     Conjunctiva/sclera: Conjunctivae normal.  Neck:     Thyroid: No thyromegaly.  Cardiovascular:     Rate and Rhythm: Normal rate and regular rhythm.  Pulmonary:     Effort: No tachypnea, accessory muscle usage or respiratory distress.     Breath sounds: Normal breath sounds. No decreased breath sounds or wheezing.  Chest:     Breasts:        Right: No inverted nipple, mass, nipple discharge or tenderness (no axillary adenopathy).        Left: No inverted nipple, mass, nipple discharge or tenderness (no axilarry adenopathy).  Abdominal:     General: Bowel sounds are normal.     Palpations: Abdomen is soft.     Tenderness: There is no abdominal tenderness.  Musculoskeletal:        General: No swelling or tenderness.     Cervical back: Neck supple. No tenderness.  Lymphadenopathy:     Cervical: No cervical adenopathy.  Skin:    Findings: No erythema or rash.  Neurological:     Mental Status: She is alert and oriented to person, place, and time.  Psychiatric:        Mood and Affect:  Mood normal.        Behavior: Behavior normal.     BP 128/78   Pulse 79   Temp 97.6 F (36.4 C)   Resp 16   Ht 5' 3"  (1.6 m)   Wt 179 lb (81.2 kg)   LMP 06/28/1982   SpO2 97%   BMI  31.71 kg/m  Wt Readings from Last 3 Encounters:  11/04/19 179 lb (81.2 kg)  09/01/19 170 lb (77.1 kg)  07/26/19 179 lb 3.2 oz (81.3 kg)     Lab Results  Component Value Date   WBC 3.5 (L) 07/26/2019   HGB 14.8 07/26/2019   HCT 45.0 07/26/2019   PLT 209 07/26/2019   GLUCOSE 94 11/04/2019   CHOL 164 11/04/2019   TRIG 86.0 11/04/2019   HDL 64.60 11/04/2019   LDLDIRECT 150.8 03/02/2013   LDLCALC 83 11/04/2019   ALT 37 (H) 11/04/2019   AST 25 11/04/2019   NA 138 11/04/2019   K 4.2 11/04/2019   CL 102 11/04/2019   CREATININE 0.92 11/04/2019   BUN 19 11/04/2019   CO2 31 11/04/2019   TSH 1.14 07/21/2019   HGBA1C 5.8 11/04/2019       Assessment & Plan:   Problem List Items Addressed This Visit    Abnormal liver function    Previous abdominal ultrasound negative.  Follow liver function tests.        Elevated hemoglobin ([place])    Being followed by Dr Tasia Catchings.  Treating sleep apnea.        Essential hypertension, benign   Relevant Orders   Basic metabolic panel (Completed)   Health care maintenance    Physical today 11/04/19.  Mammogram 8/12/2- - Birads I.  Scheduled for f/u mammgram.  Colonoscopy just performed.  Obtain copy. Schedule bone density.         History of colon cancer    Just had colonoscopy 10/2019.  States ok.  Recommended f/u in 3 years - per pt. Obtain results.        Hyperglycemia    Low carb diet and exercise. Follow met b and a1c.       Relevant Orders   Hemoglobin A1c (Completed)   Leukopenia    Follow cbc.       Pure hypercholesterolemia    On crestor.  Low cholesterol diet and exercise.  Follow lipid panel and liver function tests.        Relevant Orders   Hepatic function panel (Completed)   Lipid panel (Completed)   Sleep apnea    Continue cpap.   Using nightly.  Follow.         Other Visit Diagnoses    Estrogen deficiency    -  Primary   Relevant Orders   DG Bone Density       [name], MD

## 2019-11-04 NOTE — Assessment & Plan Note (Addendum)
Physical today 11/04/19.  Mammogram 8/12/2- - Birads I.  Scheduled for f/u mammgram.  Colonoscopy just performed.  Obtain copy. Schedule bone density.

## 2019-11-06 ENCOUNTER — Encounter: Payer: Self-pay | Admitting: Internal Medicine

## 2019-11-06 NOTE — Assessment & Plan Note (Signed)
Being followed by Dr Tasia Catchings.  Treating sleep apnea.

## 2019-11-06 NOTE — Assessment & Plan Note (Signed)
Low carb diet and exercise. Follow met b and a1c.  

## 2019-11-06 NOTE — Assessment & Plan Note (Signed)
Previous abdominal ultrasound negative.  Follow liver function tests.   

## 2019-11-06 NOTE — Assessment & Plan Note (Signed)
On crestor.  Low cholesterol diet and exercise.  Follow lipid panel and liver function tests.   

## 2019-11-06 NOTE — Assessment & Plan Note (Signed)
Follow cbc.  

## 2019-11-06 NOTE — Assessment & Plan Note (Signed)
Continue cpap.  Using nightly.  Follow.

## 2019-11-06 NOTE — Assessment & Plan Note (Signed)
Just had colonoscopy 10/2019.  States ok.  Recommended f/u in 3 years - per pt. Obtain results.

## 2019-11-08 ENCOUNTER — Telehealth: Payer: Self-pay | Admitting: Internal Medicine

## 2019-11-08 NOTE — Telephone Encounter (Signed)
See result note.  

## 2019-11-08 NOTE — Telephone Encounter (Signed)
Patient returned office phone call for resutls 

## 2019-12-19 ENCOUNTER — Other Ambulatory Visit: Payer: Self-pay

## 2019-12-19 ENCOUNTER — Ambulatory Visit
Admission: RE | Admit: 2019-12-19 | Discharge: 2019-12-19 | Disposition: A | Discharge status: Home / Self Care | Payer: BC Managed Care – PPO | Source: Ambulatory Visit | Attending: Internal Medicine | Admitting: Internal Medicine

## 2019-12-19 DIAGNOSIS — E2839 Other primary ovarian failure: Secondary | ICD-10-CM | POA: Diagnosis not present

## 2019-12-19 DIAGNOSIS — Z1231 Encounter for screening mammogram for malignant neoplasm of breast: Secondary | ICD-10-CM | POA: Insufficient documentation

## 2020-03-06 ENCOUNTER — Ambulatory Visit (INDEPENDENT_AMBULATORY_CARE_PROVIDER_SITE_OTHER): Payer: BC Managed Care – PPO | Admitting: Internal Medicine

## 2020-03-06 ENCOUNTER — Other Ambulatory Visit: Payer: Self-pay

## 2020-03-06 ENCOUNTER — Encounter: Payer: Self-pay | Admitting: Internal Medicine

## 2020-03-06 VITALS — BP 122/74 | HR 82 | Temp 98.2°F | Resp 16 | Ht 63.0 in | Wt 183.0 lb

## 2020-03-06 DIAGNOSIS — D72819 Decreased white blood cell count, unspecified: Secondary | ICD-10-CM

## 2020-03-06 DIAGNOSIS — Z85038 Personal history of other malignant neoplasm of large intestine: Secondary | ICD-10-CM

## 2020-03-06 DIAGNOSIS — E7800 Pure hypercholesterolemia, unspecified: Secondary | ICD-10-CM

## 2020-03-06 DIAGNOSIS — R739 Hyperglycemia, unspecified: Secondary | ICD-10-CM

## 2020-03-06 DIAGNOSIS — Z23 Encounter for immunization: Secondary | ICD-10-CM

## 2020-03-06 DIAGNOSIS — D582 Other hemoglobinopathies: Secondary | ICD-10-CM

## 2020-03-06 DIAGNOSIS — R945 Abnormal results of liver function studies: Secondary | ICD-10-CM

## 2020-03-06 DIAGNOSIS — I10 Essential (primary) hypertension: Secondary | ICD-10-CM | POA: Diagnosis not present

## 2020-03-06 LAB — BASIC METABOLIC PANEL
BUN: 18 mg/dL (ref 6–23)
CO2: 29 mEq/L (ref 19–32)
Calcium: 9.6 mg/dL (ref 8.4–10.5)
Chloride: 102 mEq/L (ref 96–112)
Creatinine, Ser: 0.92 mg/dL (ref 0.40–1.20)
GFR: 63.17 mL/min (ref >60.00)
Glucose, Bld: 91 mg/dL (ref 70–99)
Potassium: 4 mEq/L (ref 3.5–5.1)
Sodium: 139 mEq/L (ref 135–145)

## 2020-03-06 LAB — CBC WITH DIFFERENTIAL/PLATELET
Basophils Absolute: 0 10*3/uL (ref 0.0–0.1)
Basophils Relative: 1 % (ref 0.0–3.0)
Eosinophils Absolute: 0.1 10*3/uL (ref 0.0–0.7)
Eosinophils Relative: 2.4 % (ref 0.0–5.0)
HCT: 45.5 % (ref 36.0–46.0)
Hemoglobin: 15.3 g/dL — ABNORMAL HIGH (ref 12.0–15.0)
Lymphocytes Relative: 37 % (ref 12.0–46.0)
Lymphs Abs: 1.5 10*3/uL (ref 0.7–4.0)
MCHC: 33.7 g/dL (ref 30.0–36.0)
MCV: 86.5 fl (ref 78.0–100.0)
Monocytes Absolute: 0.3 10*3/uL (ref 0.1–1.0)
Monocytes Relative: 7.3 % (ref 3.0–12.0)
Neutro Abs: 2 10*3/uL (ref 1.4–7.7)
Neutrophils Relative %: 52.3 % (ref 43.0–77.0)
Platelets: 234 10*3/uL (ref 150.0–400.0)
RBC: 5.26 Mil/uL — ABNORMAL HIGH (ref 3.87–5.11)
RDW: 13.4 % (ref 11.5–15.5)
WBC: 3.9 10*3/uL — ABNORMAL LOW (ref 4.0–10.5)

## 2020-03-06 LAB — LIPID PANEL
Cholesterol: 165 mg/dL (ref 0–200)
HDL: 64 mg/dL (ref >39.00)
LDL Cholesterol: 85 mg/dL (ref 0–99)
NonHDL: 100.75
Total CHOL/HDL Ratio: 3
Triglycerides: 81 mg/dL (ref 0.0–149.0)
VLDL: 16.2 mg/dL (ref 0.0–40.0)

## 2020-03-06 LAB — HEMOGLOBIN A1C: Hgb A1c MFr Bld: 6 % (ref 4.6–6.5)

## 2020-03-06 LAB — HEPATIC FUNCTION PANEL
ALT: 39 U/L — ABNORMAL HIGH (ref 0–35)
AST: 27 U/L (ref 0–37)
Albumin: 4.6 g/dL (ref 3.5–5.2)
Alkaline Phosphatase: 60 U/L (ref 39–117)
Bilirubin, Direct: 0.2 mg/dL (ref 0.0–0.3)
Total Bilirubin: 1 mg/dL (ref 0.2–1.2)
Total Protein: 7.4 g/dL (ref 6.0–8.3)

## 2020-03-06 MED ORDER — NYSTATIN 100000 UNIT/GM EX CREA: 1.0000 "application " | TOPICAL_CREAM | Freq: Two times a day (BID) | CUTANEOUS | 0 refills | Status: DC

## 2020-03-06 NOTE — Progress Notes (Signed)
[name]: Susan Weeks, female   DOB: Jul 30, 1950, 69 y.o.   MRN: [id number]   Subjective:    [name]: Susan Weeks, female    DOB: 1950-11-16, 69 y.o.   MRN: [id number]  HPI This visit occurred during the SARS-CoV-2 public health emergency.  Safety protocols were in place, including screening questions prior to the visit, additional usage of staff PPE, and extensive cleaning of exam room while observing appropriate contact time as indicated for disinfecting solutions.  Patient here for a scheduled follow up.  She reports she is doing well.  Feels good.  Reports has gained weight.  Noticed some irritation - folds - lower abdomen since weight gain.  Used powder.  Improved.  Hit her outer left hip on a corner.  Noticed a bruise.  No pain.  Discussed monitoring and she is to let me know if does not resolve.  Has been eating out more and eating more bread.  Has started cutting back on bread intake.  Discussed diet and exercise.  No chest pain or sob.  No acid reflux.  No abdominal pain.  Bowels moving.    Past Medical History:  Diagnosis Date  . Abnormal liver function   . Colon cancer ([place])    adenocarcinoma of the cecum s/p partial colon resection  . History of colon polyps   . Hyperlipidemia   . Hypertension    Past Surgical History:  Procedure Laterality Date  . ABDOMINAL HYSTERECTOMY  1984   secondary to bleeding and fibroid tumors  . APPENDECTOMY    . COLONOSCOPY W/ POLYPECTOMY  2001  . COLONOSCOPY WITH PROPOFOL N/A 07/28/2016   Procedure: COLONOSCOPY WITH PROPOFOL;  Surgeon: [name], MD;  Location: [facility] ENDOSCOPY;  Service: Endoscopy;  Laterality: N/A;   Family History  Problem Relation Age of Onset  . Stroke Mother   . Hypertension Mother   . Cancer Father        Prostate   . Heart disease Brother        myocardial infarction  . Hypertension Brother   . Hypertension Sister        x4  . Breast cancer Neg Hx    Social History   Socioeconomic History  .  Marital status: Married    Spouse name: [name]  . Number of children: 1  . Years of education: HS  . Highest education level: Not on file  Occupational History    Employer: OTHER    Comment: [employer]  Tobacco Use  . Smoking status: Former Smoker    Quit date: 1978    Years since quitting: 43.8  . Smokeless tobacco: Never Used  Vaping Use  . Vaping Use: Never used  Substance and Sexual Activity  . Alcohol use: No    Alcohol/week: 0.0 standard drinks  . Drug use: No  . Sexual activity: Not on file  Other Topics Concern  . Not on file  Social History Narrative  . Not on file   Social Determinants of Health   Financial Resource Strain:   . Difficulty of Paying Living Expenses: Not on file  Food Insecurity:   . Worried About Charity fundraiser in the Last Year: Not on file  . Ran Out of Food in the Last Year: Not on file  Transportation Needs:   . Lack of Transportation (Medical): Not on file  . Lack of Transportation (Non-Medical): Not on file  Physical Activity:   . Days of Exercise per Week:  Not on file  . Minutes of Exercise per Session: Not on file  Stress:   . Feeling of Stress : Not on file  Social Connections:   . Frequency of Communication with Friends and Family: Not on file  . Frequency of Social Gatherings with Friends and Family: Not on file  . Attends Religious Services: Not on file  . Active Member of Clubs or Organizations: Not on file  . Attends Archivist Meetings: Not on file  . Marital Status: Not on file    Outpatient Encounter Medications as of 03/06/2020  Medication Sig  . amLODipine (NORVASC) 5 MG tablet TAKE 1 TABLET BY MOUTH EVERY DAY  . aspirin EC 81 MG tablet Take 1 tablet (81 mg total) by mouth daily.  . calcium carbonate (OS-CAL) 600 MG TABS Take 600 mg by mouth daily.   [name] lisinopril (ZESTRIL) 30 MG tablet TAKE 1 TABLET BY MOUTH EVERY DAY  . rosuvastatin (CRESTOR) 5 MG tablet TAKE 1 TABLET BY MOUTH EVERY DAY  . vitamin  E 400 UNIT capsule Take 400 Units by mouth daily.   [name] nystatin cream (MYCOSTATIN) Apply 1 application topically 2 (two) times daily.   No facility-administered encounter medications on file as of 03/06/2020.    Review of Systems  Constitutional: Negative for appetite change and unexpected weight change.  HENT: Negative for congestion and sinus pressure.   Respiratory: Negative for cough, chest tightness and shortness of breath.   Cardiovascular: Negative for chest pain, palpitations and leg swelling.  Gastrointestinal: Negative for abdominal pain, diarrhea, nausea and vomiting.  Genitourinary: Negative for difficulty urinating and dysuria.  Musculoskeletal: Negative for joint swelling and myalgias.  Skin: Negative for color change and rash.  Neurological: Negative for dizziness, light-headedness and headaches.  Psychiatric/Behavioral: Negative for agitation and dysphoric mood.       Objective:    Physical Exam Vitals reviewed.  Constitutional:      General: She is not in acute distress.    Appearance: Normal appearance.  HENT:     Head: Normocephalic and atraumatic.     Right Ear: External ear normal.     Left Ear: External ear normal.  Eyes:     General: No scleral icterus.       Right eye: No discharge.        Left eye: No discharge.     Conjunctiva/sclera: Conjunctivae normal.  Neck:     Thyroid: No thyromegaly.  Cardiovascular:     Rate and Rhythm: Normal rate and regular rhythm.  Pulmonary:     Effort: No respiratory distress.     Breath sounds: Normal breath sounds. No wheezing.  Abdominal:     General: Bowel sounds are normal.     Palpations: Abdomen is soft.     Tenderness: There is no abdominal tenderness.  Musculoskeletal:        General: No swelling or tenderness.     Cervical back: Neck supple.  Lymphadenopathy:     Cervical: No cervical adenopathy.  Skin:    Findings: No erythema or rash.  Neurological:     Mental Status: She is alert.  Psychiatric:         Mood and Affect: Mood normal.        Behavior: Behavior normal.     BP 122/74   Pulse 82   Temp 98.2 F (36.8 C) (Oral)   Resp 16   Ht 5' 3"  (1.6 m)   Wt 183 lb (83 kg)  LMP 06/28/1982   SpO2 98%   BMI 32.42 kg/m  Wt Readings from Last 3 Encounters:  03/06/20 183 lb (83 kg)  11/04/19 179 lb (81.2 kg)  09/01/19 170 lb (77.1 kg)     Lab Results  Component Value Date   WBC 3.9 (L) 03/06/2020   HGB 15.3 (H) 03/06/2020   HCT 45.5 03/06/2020   PLT 234.0 03/06/2020   GLUCOSE 91 03/06/2020   CHOL 165 03/06/2020   TRIG 81.0 03/06/2020   HDL 64.00 03/06/2020   LDLDIRECT 150.8 03/02/2013   LDLCALC 85 03/06/2020   ALT 39 (H) 03/06/2020   AST 27 03/06/2020   NA 139 03/06/2020   K 4.0 03/06/2020   CL 102 03/06/2020   CREATININE 0.92 03/06/2020   BUN 18 03/06/2020   CO2 29 03/06/2020   TSH 1.14 07/21/2019   HGBA1C 6.0 03/06/2020    DG Bone Density  Result Date: 12/19/2019 EXAM: DUAL X-RAY ABSORPTIOMETRY (DXA) FOR BONE MINERAL DENSITY IMPRESSION: Your patient Laekyn Rayos completed a BMD test on 12/19/2019 using the [place] (software version: 14.10) manufactured by [employer]. The following summarizes the results of our evaluation. Technologist: SCE PATIENT BIOGRAPHICAL: Name: Lolly, Glaus [name]: [id number] Birth Date: Mar 14, 1951 Height: 63.0 in. Gender: Female Exam Date: 12/19/2019 Weight: 182.1 lbs. Indications: Advanced Age, Hysterectomy, Oophorectomy Bilateral, Postmenopausal Fractures: Treatments: Calcium, Vitamin D DENSITOMETRY RESULTS: Site      Region    Measured Date Measured Age WHO Classification Young Adult T-score BMD         %Change vs. Previous Significant Change (*) AP Spine L1-L4 12/19/2019 70.1 Normal 2.4 1.495 g/cm2 - - DualFemur Neck Left 12/19/2019 70.1 Normal 0.0 1.036 g/cm2 - - ASSESSMENT: The BMD measured at Femur Neck Left is 1.036 g/cm2 with a T-score of 0.0. This patient is considered normal according to [facility]) criteria. The scan quality is good. World Pharmacologist [facility]) criteria for post-menopausal, Caucasian Women: Normal:                   T-score at or above -1 SD Osteopenia/low bone mass: T-score between -1 and -2.5 SD Osteoporosis:             T-score at or below -2.5 SD RECOMMENDATIONS: 1. All patients should optimize calcium and vitamin D intake. 2. Consider FDA-approved medical therapies in postmenopausal women and men aged 17 years and older, based on the following: a. A hip or vertebral(clinical or morphometric) fracture b. T-score < -2.5 at the femoral neck or spine after appropriate evaluation to exclude secondary causes c. Low bone mass (T-score between -1.0 and -2.5 at the femoral neck or spine) and a 10-year probability of a hip fracture > 3% or a 10-year probability of a major osteoporosis-related fracture > 20% based on the [place]-adapted WHO algorithm 3. Clinician judgment and/or patient preferences may indicate treatment for people with 10-year fracture probabilities above or below these levels FOLLOW-UP: People with diagnosed cases of osteoporosis or at high risk for fracture should have regular bone mineral density tests. For patients eligible for Medicare, routine testing is allowed once every 2 years. The testing frequency can be increased to one year for patients who have rapidly progressing disease, those who are receiving or discontinuing medical therapy to restore bone mass, or have additional risk factors. I have reviewed this report, and agree with the above findings. [facility] Electronically Signed   By: Lowella Grip III M.D.  On: 12/19/2019 11:10   MM 3D SCREEN BREAST BILATERAL  Result Date: 12/19/2019 CLINICAL DATA:  Screening. EXAM: DIGITAL SCREENING BILATERAL MAMMOGRAM WITH TOMO AND CAD COMPARISON:  Previous exam(s). ACR Breast Density Category b: There are scattered areas of fibroglandular density. FINDINGS: There are no findings  suspicious for malignancy. Images were processed with CAD. IMPRESSION: No mammographic evidence of malignancy. A result letter of this screening mammogram will be mailed directly to the patient. RECOMMENDATION: Screening mammogram in one year. (Code:SM-B-01Y) BI-RADS CATEGORY  1: Negative. Electronically Signed   By: Ammie Ferrier M.D.   On: 12/19/2019 14:22       Assessment & Plan:   Problem List Items Addressed This Visit    Pure hypercholesterolemia    On crestor.  Low cholesterol diet and exercise.  Follow lipid panel and liver function tests.        Relevant Orders   Hepatic function panel (Completed)   Lipid panel (Completed)   Leukopenia    Follow cbc.       Relevant Orders   CBC with Differential/Platelet (Completed)   Hyperglycemia    Low carb diet and exercise.  Follow met b and a1c.       Relevant Orders   Hemoglobin A1c (Completed)   History of colon cancer    Colonoscopy 10/2019.  Recommended f/u in 3 years.       Relevant Orders   CEA (Completed)   Essential hypertension, benign - Primary    Continue amlodipine and lisinopril.  Pressures as outlined.  Follow pressures.  Follow metabolic panel.       Relevant Orders   Basic metabolic panel (Completed)   Elevated hemoglobin ([facility])    Recent hgb 15.3.  Follow.       Abnormal liver function    Previous abdominal ultrasound negative.  Follow liver function tests.         Other Visit Diagnoses    Need for immunization against influenza       Relevant Orders   Flu Vaccine QUAD High Dose(Fluad) (Completed)       [name], MD

## 2020-03-07 LAB — CEA: CEA: 2.7 ng/mL — ABNORMAL HIGH

## 2020-03-11 ENCOUNTER — Other Ambulatory Visit: Payer: Self-pay | Admitting: Internal Medicine

## 2020-03-12 ENCOUNTER — Encounter: Payer: Self-pay | Admitting: Internal Medicine

## 2020-03-12 NOTE — Assessment & Plan Note (Signed)
Previous abdominal ultrasound negative.  Follow liver function tests.

## 2020-03-12 NOTE — Assessment & Plan Note (Signed)
Continue amlodipine and lisinopril.  Pressures as outlined.  Follow pressures.  Follow metabolic panel.

## 2020-03-12 NOTE — Assessment & Plan Note (Signed)
Recent hgb 15.3.  Follow.

## 2020-03-12 NOTE — Assessment & Plan Note (Signed)
Low carb diet and exercise.  Follow met b and a1c.  

## 2020-03-12 NOTE — Assessment & Plan Note (Signed)
Colonoscopy 10/2019.  Recommended f/u in 3 years.

## 2020-03-12 NOTE — Assessment & Plan Note (Signed)
Follow cbc.  

## 2020-03-12 NOTE — Assessment & Plan Note (Signed)
On crestor.  Low cholesterol diet and exercise.  Follow lipid panel and liver function tests.   

## 2020-03-13 ENCOUNTER — Other Ambulatory Visit: Payer: Self-pay | Admitting: Internal Medicine

## 2020-03-13 DIAGNOSIS — R7989 Other specified abnormal findings of blood chemistry: Secondary | ICD-10-CM

## 2020-03-13 DIAGNOSIS — R945 Abnormal results of liver function studies: Secondary | ICD-10-CM

## 2020-03-13 NOTE — Progress Notes (Signed)
Order placed for abdominal ultrasound.   

## 2020-03-14 ENCOUNTER — Other Ambulatory Visit: Payer: Self-pay | Admitting: Internal Medicine

## 2020-03-16 ENCOUNTER — Other Ambulatory Visit: Payer: Self-pay | Admitting: Internal Medicine

## 2020-03-19 ENCOUNTER — Other Ambulatory Visit: Payer: Self-pay

## 2020-03-19 ENCOUNTER — Telehealth: Payer: Self-pay | Admitting: Internal Medicine

## 2020-03-19 ENCOUNTER — Ambulatory Visit
Admission: RE | Admit: 2020-03-19 | Discharge: 2020-03-19 | Disposition: A | Discharge status: Home / Self Care | Payer: BC Managed Care – PPO | Source: Ambulatory Visit | Attending: Internal Medicine | Admitting: Internal Medicine

## 2020-03-19 DIAGNOSIS — R945 Abnormal results of liver function studies: Secondary | ICD-10-CM | POA: Insufficient documentation

## 2020-03-19 DIAGNOSIS — R7989 Other specified abnormal findings of blood chemistry: Secondary | ICD-10-CM

## 2020-03-19 NOTE — Telephone Encounter (Signed)
Called pt and unable to reach her.  Left her a message to call.  Notify pt that her abdominal ultrasound revealed a small area in her liver that we just need to get a better look at.  (small unclear area - just need to get a better view/picture.)  Recommending MRI.  If agreeable, let me know and I will place the order.

## 2020-03-19 NOTE — Telephone Encounter (Signed)
Radiology calling to report that Patient's ultrasound results are now in the system.

## 2020-03-20 NOTE — Telephone Encounter (Signed)
Patient understood results and is agreeable to have MRI done.

## 2020-03-20 NOTE — Telephone Encounter (Signed)
Pt called back returning Dr. Bary Leriche call

## 2020-03-21 ENCOUNTER — Other Ambulatory Visit: Payer: Self-pay | Admitting: Internal Medicine

## 2020-03-21 DIAGNOSIS — R935 Abnormal findings on diagnostic imaging of other abdominal regions, including retroperitoneum: Secondary | ICD-10-CM

## 2020-03-21 DIAGNOSIS — K769 Liver disease, unspecified: Secondary | ICD-10-CM

## 2020-03-21 NOTE — Telephone Encounter (Signed)
Order placed for MRI abdomen.  

## 2020-03-21 NOTE — Progress Notes (Signed)
Order placed for MRI abdomen.  

## 2020-04-04 ENCOUNTER — Other Ambulatory Visit: Payer: Self-pay

## 2020-04-04 ENCOUNTER — Ambulatory Visit
Admission: RE | Admit: 2020-04-04 | Discharge: 2020-04-04 | Disposition: A | Discharge status: Home / Self Care | Payer: BC Managed Care – PPO | Source: Ambulatory Visit | Attending: Internal Medicine | Admitting: Internal Medicine

## 2020-04-04 DIAGNOSIS — R935 Abnormal findings on diagnostic imaging of other abdominal regions, including retroperitoneum: Secondary | ICD-10-CM | POA: Diagnosis not present

## 2020-04-04 DIAGNOSIS — K769 Liver disease, unspecified: Secondary | ICD-10-CM | POA: Diagnosis present

## 2020-04-04 MED ORDER — GADOBUTROL 1 MMOL/ML IV SOLN
8.0000 mL | Freq: Once | INTRAVENOUS | Status: AC | PRN
  Administered 2020-04-04: 8 mL via INTRAVENOUS

## 2020-04-05 ENCOUNTER — Telehealth: Payer: Self-pay

## 2020-04-05 ENCOUNTER — Ambulatory Visit (INDEPENDENT_AMBULATORY_CARE_PROVIDER_SITE_OTHER): Payer: BC Managed Care – PPO | Admitting: Pulmonary Disease

## 2020-04-05 ENCOUNTER — Encounter: Payer: Self-pay | Admitting: Pulmonary Disease

## 2020-04-05 VITALS — BP 130/90 | HR 88 | Temp 96.9°F | Ht 63.0 in | Wt 183.0 lb

## 2020-04-05 DIAGNOSIS — D751 Secondary polycythemia: Secondary | ICD-10-CM | POA: Diagnosis not present

## 2020-04-05 DIAGNOSIS — G4733 Obstructive sleep apnea (adult) (pediatric): Secondary | ICD-10-CM

## 2020-04-05 DIAGNOSIS — Z85038 Personal history of other malignant neoplasm of large intestine: Secondary | ICD-10-CM

## 2020-04-05 DIAGNOSIS — K769 Liver disease, unspecified: Secondary | ICD-10-CM

## 2020-04-05 DIAGNOSIS — N9489 Other specified conditions associated with female genital organs and menstrual cycle: Secondary | ICD-10-CM

## 2020-04-05 NOTE — Patient Instructions (Signed)
Follow up in 1 year.

## 2020-04-05 NOTE — Progress Notes (Addendum)
Susan Weeks Pul[facility], Critical Care, and Sleep Medicine  Chief Complaint  Patient presents with  . Follow-up    no problems    Constitutional:  BP 130/90 (BP Location: Left Arm, Patient Position: Sitting, Cuff Size: Large)   Pulse 88   Temp (!) 96.9 F (36.1 C) (Temporal)   Ht 5\' 3"  (1.6 m)   Wt 183 lb (83 kg)   LMP 06/28/1982   SpO2 98%   BMI 32.42 kg/m   Past Medical History:  HTN, HLD, Colon cancer  Past Surgical History:  Her  has a past surgical history that includes Appendectomy; Abdominal hysterectomy (1984); Colonoscopy with propofol (N/A, 07/28/2016); and Colonoscopy w/ polypectomy (2001).  Brief Summary:  Susan Weeks is a 69 y.o. female with obstructive sleep apnea.      Subjective:   She has been doing well with CPAP.  Got a new mask and fits better.  Not having sinus congestion, sore throat, dry mouth or aerophagia.  Not needing to take naps.  Hemoglobin from 03/06/20 was 15.3.  Physical Exam:   Appearance - well kempt   ENMT - no sinus tenderness, no oral exudate, no LAN, Mallampati 3 airway, no stridor  Respiratory - equal breath sounds bilaterally, no wheezing or rales  CV - s1s2 regular rate and rhythm, no murmurs  Ext - no clubbing, no edema  Skin - no rashes  Psych - normal mood and affect   Sleep Tests:   HST 12/28/17 >> AHI 8.5, SpO2 low 72%  Auto CPAP 03/05/20 to 04/03/20 >> used on 30 of 30 nights with average 5 hrs 34 min.  Average AHI 1.7 with median CPAP 10 and 95 th percentile CPAP 13 cm H2O  Social History:  She  reports that she quit smoking about 43 years ago. She has never used smokeless tobacco. She reports that she does not drink alcohol and does not use drugs.  Family History:  Her family history includes Cancer in her father; Heart disease in her brother; Hypertension in her brother, mother, and sister; Stroke in her mother.     Assessment/Plan:   Obstructive sleep apnea. - she is compliant with CPAP and reports  benefit from therapy - she used [employer] for her DME - continue auto CPAP 5 to 15 cm H2O  Secondary polycythemia. - followed by Dr. Earlie Weeks with hematology/oncology at [facility] in [place] Involved in Patient Care on Day of Examination:  21 minutes  Follow up:  Patient Instructions  Follow up in 1 year   Medication List:   Allergies as of 04/05/2020      Reactions   Z-pak [azithromycin] Other (See Comments)   Caused Emesis      Medication List       Accurate as of April 05, 2020  9:35 AM. If you have any questions, ask your nurse or doctor.        amLODipine 5 MG tablet Commonly known as: NORVASC TAKE 1 TABLET BY MOUTH EVERY DAY   aspirin EC 81 MG tablet Take 1 tablet (81 mg total) by mouth daily.   calcium carbonate 600 MG Tabs tablet Commonly known as: OS-CAL Take 600 mg by mouth daily.   lisinopril 30 MG tablet Commonly known as: ZESTRIL TAKE 1 TABLET BY MOUTH EVERY DAY   nystatin cream Commonly known as: MYCOSTATIN Apply 1 application topically 2 (two) times daily.   rosuvastatin 5 MG tablet Commonly known as: CRESTOR TAKE 1 TABLET  BY MOUTH EVERY DAY   vitamin E 180 MG (400 UNITS) capsule Take 400 Units by mouth daily.       Signature:  Susan Mires, MD [place] Pager - [phone] 04/05/2020, 9:35 AM

## 2020-04-05 NOTE — Telephone Encounter (Signed)
[facility] radiology called to let provider know the MRI results:  IMPRESSION: 1. Focal hepatic lesions in the RIGHT and LEFT hepatic lobes as described. Lesions in the RIGHT and LEFT lobes of the liver have similar imaging characteristics and are suspicious for metastatic disease in this patient with reported history of colon cancer. Diffusion characteristics and limited visualization on the 45 second phase of the evaluation could also be seen in the setting of neuroendocrine tumor. Suggest biochemical correlation and consider PET for further evaluation. 2. Potential solid RIGHT adnexal lesion measuring 2.6 x 2.4 cm. Potential ovarian neoplasm. Pelvic ultrasound is suggested for further assessment. 3. Post partial colectomy with ileocolonic anastomosis. Mildly enlarged lymph nodes throughout the mesentery the largest which cannot be imaged in the axial plane, attention on PET imaging to this area and also the ovary could potentially be helpful.

## 2020-04-05 NOTE — Addendum Note (Signed)
Addended by: Chesley Mires on: 04/05/2020 09:41 AM   Modules accepted: Level of Service

## 2020-04-07 NOTE — Addendum Note (Signed)
Addended by: Alisa Graff on: 04/07/2020 02:12 PM   Modules accepted: Orders

## 2020-04-07 NOTE — Telephone Encounter (Signed)
Notified pt of MRI results.  Discussed need for further w/up and evaluation.  Pelvic ultrasound ordered.  Order placed for referral back to Dr Tasia Catchings to f/u on liver lesions and for further w/up.  Pt in agreement.

## 2020-04-11 ENCOUNTER — Encounter: Payer: Self-pay | Admitting: Oncology

## 2020-04-11 ENCOUNTER — Inpatient Hospital Stay (HOSPITAL_BASED_OUTPATIENT_CLINIC_OR_DEPARTMENT_OTHER): Payer: BC Managed Care – PPO | Admitting: Oncology

## 2020-04-11 ENCOUNTER — Other Ambulatory Visit: Payer: Self-pay

## 2020-04-11 ENCOUNTER — Inpatient Hospital Stay: Payer: BC Managed Care – PPO | Attending: Oncology

## 2020-04-11 VITALS — BP 136/70 | HR 83 | Temp 98.4°F | Resp 18 | Wt 180.1 lb

## 2020-04-11 DIAGNOSIS — Z8249 Family history of ischemic heart disease and other diseases of the circulatory system: Secondary | ICD-10-CM | POA: Diagnosis not present

## 2020-04-11 DIAGNOSIS — G4730 Sleep apnea, unspecified: Secondary | ICD-10-CM | POA: Insufficient documentation

## 2020-04-11 DIAGNOSIS — E785 Hyperlipidemia, unspecified: Secondary | ICD-10-CM | POA: Insufficient documentation

## 2020-04-11 DIAGNOSIS — D751 Secondary polycythemia: Secondary | ICD-10-CM | POA: Diagnosis not present

## 2020-04-11 DIAGNOSIS — K769 Liver disease, unspecified: Secondary | ICD-10-CM | POA: Diagnosis not present

## 2020-04-11 DIAGNOSIS — Z7982 Long term (current) use of aspirin: Secondary | ICD-10-CM | POA: Insufficient documentation

## 2020-04-11 DIAGNOSIS — Z85038 Personal history of other malignant neoplasm of large intestine: Secondary | ICD-10-CM

## 2020-04-11 DIAGNOSIS — Z90710 Acquired absence of both cervix and uterus: Secondary | ICD-10-CM | POA: Insufficient documentation

## 2020-04-11 DIAGNOSIS — Z87891 Personal history of nicotine dependence: Secondary | ICD-10-CM | POA: Diagnosis not present

## 2020-04-11 DIAGNOSIS — R970 Elevated carcinoembryonic antigen [CEA]: Secondary | ICD-10-CM | POA: Insufficient documentation

## 2020-04-11 DIAGNOSIS — Z79899 Other long term (current) drug therapy: Secondary | ICD-10-CM | POA: Insufficient documentation

## 2020-04-11 DIAGNOSIS — I10 Essential (primary) hypertension: Secondary | ICD-10-CM | POA: Diagnosis not present

## 2020-04-11 DIAGNOSIS — Z8042 Family history of malignant neoplasm of prostate: Secondary | ICD-10-CM | POA: Diagnosis not present

## 2020-04-11 LAB — COMPREHENSIVE METABOLIC PANEL
ALT: 28 U/L (ref 0–44)
AST: 22 U/L (ref 15–41)
Albumin: 4.5 g/dL (ref 3.5–5.0)
Alkaline Phosphatase: 44 U/L (ref 38–126)
Anion gap: 12 (ref 5–15)
BUN: 18 mg/dL (ref 8–23)
CO2: 23 mmol/L (ref 22–32)
Calcium: 9.3 mg/dL (ref 8.9–10.3)
Chloride: 101 mmol/L (ref 98–111)
Creatinine, Ser: 0.9 mg/dL (ref 0.44–1.00)
GFR, Estimated: >60 mL/min (ref >60)
Glucose, Bld: 113 mg/dL — ABNORMAL HIGH (ref 70–99)
Potassium: 4.3 mmol/L (ref 3.5–5.1)
Sodium: 136 mmol/L (ref 135–145)
Total Bilirubin: 1.3 mg/dL — ABNORMAL HIGH (ref 0.3–1.2)
Total Protein: 7.4 g/dL (ref 6.5–8.1)

## 2020-04-11 LAB — CBC WITH DIFFERENTIAL/PLATELET
Abs Immature Granulocytes: 0 10*3/uL (ref 0.00–0.07)
Basophils Absolute: 0 10*3/uL (ref 0.0–0.1)
Basophils Relative: 1 %
Eosinophils Absolute: 0.1 10*3/uL (ref 0.0–0.5)
Eosinophils Relative: 1 %
HCT: 43.8 % (ref 36.0–46.0)
Hemoglobin: 15.1 g/dL — ABNORMAL HIGH (ref 12.0–15.0)
Immature Granulocytes: 0 %
Lymphocytes Relative: 36 %
Lymphs Abs: 1.3 10*3/uL (ref 0.7–4.0)
MCH: 29.5 pg (ref 26.0–34.0)
MCHC: 34.5 g/dL (ref 30.0–36.0)
MCV: 85.7 fL (ref 80.0–100.0)
Monocytes Absolute: 0.3 10*3/uL (ref 0.1–1.0)
Monocytes Relative: 7 %
Neutro Abs: 2 10*3/uL (ref 1.7–7.7)
Neutrophils Relative %: 55 %
Platelets: 203 10*3/uL (ref 150–400)
RBC: 5.11 MIL/uL (ref 3.87–5.11)
RDW: 12.6 % (ref 11.5–15.5)
WBC: 3.6 10*3/uL — ABNORMAL LOW (ref 4.0–10.5)
nRBC: 0 % (ref 0.0–0.2)

## 2020-04-11 NOTE — Progress Notes (Signed)
Patient here for follow up. No new reports voiced.

## 2020-04-11 NOTE — Progress Notes (Signed)
Hematology/Oncology follow up note [facility] [phone] [phone]   Patient Care Team: [name], MD as PCP - General (Internal Medicine)  REFERRING PROVIDER: [name], MD  REASON FOR VISIT Follow up for treatment of erythrocytosis, new liver lesion and adnexal lesion  HISTORY OF PRESENTING ILLNESS:  Susan Weeks is a  69 y.o.  female with PMH listed below who was referred to me for evaluation of elevated hemoglobin.  Recent hemoglobin 16.2, hct 47.7, reviewed her previous labs, hemoglobin has been chronically high since 2017, ranging from 15.1 to 16.2.   Pertinent Oncology Histroy  Previous history of carcinoma of colon status post resection, has an intermittent rising CEA.  Status post resection, no evidence of malignancy.-2011 Patient had exploratory laparotomy because of abnormal PET scan following rising CEA and was negative for any malignancy. CEA  remains stable in the range of 5.0   #Erythrocytosis was thought to be secondary, negative for Jak 2 mutation with reflex, BCR ABL mutation negative. # Sleep Apnea, patient establish care with Dr. Hector Shade.   on CPAP machine.  INTERVAL HISTORY Susan Weeks is a 69 y.o. female who has above history reviewed by me today presents for follow-up visit for management of erythrocytosis, history of colon cancer. Patient recently had blood work done with primary care provider and was noticed to have increased ALT. 03/19/2020, ultrasound abdomen complete showed indeterminate lesions in the dome of liver.  Recommend MRI. 04/04/2020, patient had MRI abdomen with and without contrast Focal hepatic lesions [right dome2.3 x 1.9 cm, left lateral segment ] and in the right and left hepatic lobes, suspicious for metastatic disease. There is also a potential solid right adnexal lesion measuring 2.6 x 2.4 cm.  Potential ovarian neoplasm. Patient was referred back to see me for further  management.  Patient denies any pain. Patient has a history of hysterectomy in the left oophorectomy. Review of Systems  Constitutional: Negative for chills, fever, malaise/fatigue and weight loss.  HENT: Negative for sore throat.   Eyes: Negative for redness.  Respiratory: Negative for cough, shortness of breath and wheezing.   Cardiovascular: Negative for chest pain, palpitations and leg swelling.  Gastrointestinal: Negative for abdominal pain, blood in stool, nausea and vomiting.  Genitourinary: Negative for dysuria.  Musculoskeletal: Negative for myalgias.  Skin: Negative for rash.  Neurological: Negative for dizziness, tingling and tremors.  Endo/Heme/Allergies: Does not bruise/bleed easily.  Psychiatric/Behavioral: Negative for hallucinations.    MEDICAL HISTORY:  Past Medical History:  Diagnosis Date  . Abnormal liver function   . Colon cancer ([place])    adenocarcinoma of the cecum s/p partial colon resection  . History of colon polyps   . Hyperlipidemia   . Hypertension     SURGICAL HISTORY: Past Surgical History:  Procedure Laterality Date  . ABDOMINAL HYSTERECTOMY  1984   secondary to bleeding and fibroid tumors  . APPENDECTOMY    . COLONOSCOPY W/ POLYPECTOMY  2001  . COLONOSCOPY WITH PROPOFOL N/A 07/28/2016   Procedure: COLONOSCOPY WITH PROPOFOL;  Surgeon: [name], MD;  Location[facility] ENDOSCOPY;  Service: Endoscopy;  Laterality: N/A;    SOCIAL HISTORY: Social History   Socioeconomic History  . Marital status: Married    Spouse name: [name]  . Number of children: 1  . Years of education: HS  . Highest education level: Not on file  Occupational History    Employer: OTHER    Comment: [employer]  Tobacco Use  . Smoking status:  Former Smoker    Quit date: 1978    Years since quitting: 43.9  . Smokeless tobacco: Never Used  Vaping Use  . Vaping Use: Never used  Substance and Sexual Activity  . Alcohol use: No    Alcohol/week: 0.0 standard  drinks  . Drug use: No  . Sexual activity: Not on file  Other Topics Concern  . Not on file  Social History Narrative  . Not on file   Social Determinants of Health   Financial Resource Strain:   . Difficulty of Paying Living Expenses: Not on file  Food Insecurity:   . Worried About Charity fundraiser in the Last Year: Not on file  . Ran Out of Food in the Last Year: Not on file  Transportation Needs:   . Lack of Transportation (Medical): Not on file  . Lack of Transportation (Non-Medical): Not on file  Physical Activity:   . Days of Exercise per Week: Not on file  . Minutes of Exercise per Session: Not on file  Stress:   . Feeling of Stress : Not on file  Social Connections:   . Frequency of Communication with Friends and Family: Not on file  . Frequency of Social Gatherings with Friends and Family: Not on file  . Attends Religious Services: Not on file  . Active Member of Clubs or Organizations: Not on file  . Attends Archivist Meetings: Not on file  . Marital Status: Not on file  Intimate Partner Violence:   . Fear of Current or Ex-Partner: Not on file  . Emotionally Abused: Not on file  . Physically Abused: Not on file  . Sexually Abused: Not on file    FAMILY HISTORY: Family History  Problem Relation Age of Onset  . Stroke Mother   . Hypertension Mother   . Cancer Father        Prostate   . Heart disease Brother        myocardial infarction  . Hypertension Brother   . Hypertension Sister        x4  . Breast cancer Neg Hx     ALLERGIES:  is allergic to z-pak [azithromycin].  MEDICATIONS:  Current Outpatient Medications  Medication Sig Dispense Refill  . amLODipine (NORVASC) 5 MG tablet TAKE 1 TABLET BY MOUTH EVERY DAY 90 tablet 1  . aspirin EC 81 MG tablet Take 1 tablet (81 mg total) by mouth daily. 90 tablet 3  . calcium carbonate (OS-CAL) 600 MG TABS Take 600 mg by mouth daily.     [name] lisinopril (ZESTRIL) 30 MG tablet TAKE 1 TABLET BY MOUTH  EVERY DAY 90 tablet 0  . nystatin cream (MYCOSTATIN) Apply 1 application topically 2 (two) times daily. 30 g 0  . rosuvastatin (CRESTOR) 5 MG tablet TAKE 1 TABLET BY MOUTH EVERY DAY 90 tablet 3  . vitamin E 400 UNIT capsule Take 400 Units by mouth daily.      No current facility-administered medications for this visit.     PHYSICAL EXAMINATION: ECOG PERFORMANCE STATUS: 0 - Asymptomatic Vitals:   04/11/20 1332  BP: 136/70  Pulse: 83  Resp: 18  Temp: 98.4 F (36.9 C)   Filed Weights   04/11/20 1332  Weight: 180 lb 1.6 oz (81.7 kg)    Physical Exam Constitutional:      General: She is not in acute distress. HENT:     Head: Normocephalic and atraumatic.  Eyes:     General: No scleral icterus.  Cardiovascular:     Rate and Rhythm: Normal rate and regular rhythm.     Heart sounds: Normal heart sounds.  Pulmonary:     Effort: Pulmonary effort is normal. No respiratory distress.     Breath sounds: No wheezing.  Abdominal:     General: Bowel sounds are normal. There is no distension.     Palpations: Abdomen is soft.  Musculoskeletal:        General: No deformity. Normal range of motion.     Cervical back: Normal range of motion and neck supple.  Skin:    General: Skin is warm and dry.     Findings: No erythema or rash.  Neurological:     Mental Status: She is alert and oriented to person, place, and time. Mental status is at baseline.     Cranial Nerves: No cranial nerve deficit.     Coordination: Coordination normal.  Psychiatric:        Mood and Affect: Mood normal.      LABORATORY DATA:  I have reviewed the data as listed Lab Results  Component Value Date   WBC 3.6 (L) 04/11/2020   HGB 15.1 (H) 04/11/2020   HCT 43.8 04/11/2020   MCV 85.7 04/11/2020   PLT 203 04/11/2020   Recent Labs    07/22/19 0953 07/22/19 0953 07/26/19 0908 07/26/19 0908 11/04/19 0935 03/06/20 0911 04/11/20 1315  NA  --   --  139   < > 138 139 136  K  --   --  4.1   < > 4.2 4.0  4.3  CL  --   --  104   < > 102 102 101  CO2  --   --  27   < > 31 29 23   GLUCOSE  --   --  89   < > 94 91 113*  BUN  --   --  16   < > 19 18 18   CREATININE  --   --  0.82   < > 0.92 0.92 0.90  CALCIUM  --   --  9.1   < > 9.9 9.6 9.3  GFRNONAA  --   --  >60  --   --   --  >60  GFRAA  --   --  >60  --   --   --   --   PROT 6.9   < > 7.3   < > 7.2 7.4 7.4  ALBUMIN 4.2   < > 4.6   < > 4.7 4.6 4.5  AST 22   < > 25   < > 25 27 22   ALT 33   < > 34   < > 37* 39* 28  ALKPHOS 49   < > 48   < > 57 60 44  BILITOT 0.7   < > 0.9   < > 0.9 1.0 1.3*  BILIDIR 0.1  --   --   --  0.2 0.2  --    < > = values in this interval not displayed.       ASSESSMENT & PLAN:  1. Erythrocytosis   2. History of colon cancer   3. Liver lesion    #New liver lesion, in the context of history of colon cancer. Patient CEA is chronically elevated.  Today CEA is pending. I recommend patient to proceed with the PET scan for further evaluation and staging. Recommend image guided liver biopsy.  I discussed with IR today and  given the difficult position of the liver lesions, IR prefers patient to have PET scan first and if no other more accessible lesions available for biopsy, patient will undergo liver biopsy  #Right adnexal lesion, patient has pelvic ultrasound scheduled next week. #Secondary erythrocytosis, labs are reviewed.  Hemoglobin is stable at 15.1, HCT 43.8.  Continue CPAP machine.    Orders Placed This Encounter  Procedures  . NM PET Image Initial (PI) Skull Base To Thigh    Standing Status:   Future    Standing Expiration Date:   04/11/2021    Order Specific Question:   If indicated for the ordered procedure, I authorize the administration of a radiopharmaceutical per Radiology protocol    Answer:   Yes    Order Specific Question:   Preferred imaging location?    Answer:   [facility]  . US BIOPSY (LIVER)    Standing Status:   Future    Standing Expiration Date:   04/11/2021    Order Specific  Question:   Lab orders requested (DO NOT place separate lab orders, these will be automatically ordered during procedure specimen collection):    Answer:   Surgical Pathology    Order Specific Question:   Reason for Exam (SYMPTOM  OR DIAGNOSIS REQUIRED)    Answer:   liver lesion, history of colon cancer    Order Specific Question:   Preferred location?    Answer:   [place] [address]    Standing Status:   Future    Number of Occurrences:   1    Standing Expiration Date:   04/11/2021    Return of visit  1 year  Earlie Server, MD, PhD Hematology Oncology [facility] Pager- [phone] 04/11/2020

## 2020-04-12 ENCOUNTER — Telehealth: Payer: Self-pay

## 2020-04-12 LAB — CEA: CEA: 4.1 ng/mL (ref 0.0–4.7)

## 2020-04-12 LAB — CA 125: Cancer Antigen (CA) 125: 12.1 U/mL (ref 0.0–38.1)

## 2020-04-12 NOTE — Telephone Encounter (Signed)
Request for liver biopsy faxed on 12/8. Dr. Tasia Catchings spoke to radiologist and they will wait for pt to have PET scan (12/21) before scheduling biopsy. Pt informed of this.

## 2020-04-16 ENCOUNTER — Other Ambulatory Visit: Payer: Self-pay

## 2020-04-16 ENCOUNTER — Ambulatory Visit
Admission: RE | Admit: 2020-04-16 | Discharge: 2020-04-16 | Disposition: A | Discharge status: Home / Self Care | Payer: BC Managed Care – PPO | Source: Ambulatory Visit | Attending: Internal Medicine | Admitting: Internal Medicine

## 2020-04-16 DIAGNOSIS — N9489 Other specified conditions associated with female genital organs and menstrual cycle: Secondary | ICD-10-CM | POA: Diagnosis not present

## 2020-04-24 ENCOUNTER — Ambulatory Visit
Admission: RE | Admit: 2020-04-24 | Discharge: 2020-04-24 | Disposition: A | Discharge status: Home / Self Care | Payer: BC Managed Care – PPO | Source: Ambulatory Visit | Attending: Oncology | Admitting: Oncology

## 2020-04-24 ENCOUNTER — Other Ambulatory Visit: Payer: Self-pay

## 2020-04-24 DIAGNOSIS — K769 Liver disease, unspecified: Secondary | ICD-10-CM | POA: Diagnosis not present

## 2020-04-24 DIAGNOSIS — K5730 Diverticulosis of large intestine without perforation or abscess without bleeding: Secondary | ICD-10-CM | POA: Diagnosis not present

## 2020-04-24 DIAGNOSIS — I2510 Atherosclerotic heart disease of native coronary artery without angina pectoris: Secondary | ICD-10-CM | POA: Diagnosis not present

## 2020-04-24 DIAGNOSIS — Z85038 Personal history of other malignant neoplasm of large intestine: Secondary | ICD-10-CM | POA: Diagnosis not present

## 2020-04-24 LAB — GLUCOSE, CAPILLARY: Glucose-Capillary: 90 mg/dL (ref 70–99)

## 2020-04-24 MED ORDER — FLUDEOXYGLUCOSE F - 18 (FDG) INJECTION
9.3000 | Freq: Once | INTRAVENOUS | Status: AC | PRN
  Administered 2020-04-24: 8.31 via INTRAVENOUS

## 2020-04-26 ENCOUNTER — Other Ambulatory Visit: Payer: Self-pay | Admitting: Oncology

## 2020-04-26 DIAGNOSIS — K769 Liver disease, unspecified: Secondary | ICD-10-CM

## 2020-04-26 DIAGNOSIS — R160 Hepatomegaly, not elsewhere classified: Secondary | ICD-10-CM

## 2020-04-26 NOTE — Telephone Encounter (Signed)
Scheduled for liver biopsy on 05/02/20 @ 10:30 to arrive at 9:30 am.  The IR nurse will call her prior to the procedure to go over the instructions.    Please schedule her for MD follow up 1 week after and inform patient of both appts please. She is aware that she will get a call with biopsy and follow up appts.

## 2020-04-30 ENCOUNTER — Other Ambulatory Visit: Payer: Self-pay | Admitting: Radiology

## 2020-05-01 ENCOUNTER — Other Ambulatory Visit: Payer: Self-pay | Admitting: Student

## 2020-05-01 NOTE — Telephone Encounter (Signed)
Pt informed of appts  

## 2020-05-02 ENCOUNTER — Ambulatory Visit
Admission: RE | Admit: 2020-05-02 | Discharge: 2020-05-02 | Disposition: A | Discharge status: Home / Self Care | Payer: BC Managed Care – PPO | Source: Ambulatory Visit | Attending: Oncology | Admitting: Oncology

## 2020-05-02 ENCOUNTER — Other Ambulatory Visit: Payer: Self-pay | Admitting: Oncology

## 2020-05-02 ENCOUNTER — Other Ambulatory Visit: Payer: Self-pay

## 2020-05-02 ENCOUNTER — Telehealth: Payer: Self-pay

## 2020-05-02 DIAGNOSIS — K769 Liver disease, unspecified: Secondary | ICD-10-CM

## 2020-05-02 DIAGNOSIS — Z85038 Personal history of other malignant neoplasm of large intestine: Secondary | ICD-10-CM

## 2020-05-02 LAB — PROTIME-INR
INR: 1 (ref 0.8–1.2)
Prothrombin Time: 12.8 seconds (ref 11.4–15.2)

## 2020-05-02 LAB — CBC
HCT: 47.5 % — ABNORMAL HIGH (ref 36.0–46.0)
Hemoglobin: 15.9 g/dL — ABNORMAL HIGH (ref 12.0–15.0)
MCH: 29 pg (ref 26.0–34.0)
MCHC: 33.5 g/dL (ref 30.0–36.0)
MCV: 86.7 fL (ref 80.0–100.0)
Platelets: 217 10*3/uL (ref 150–400)
RBC: 5.48 MIL/uL — ABNORMAL HIGH (ref 3.87–5.11)
RDW: 12.8 % (ref 11.5–15.5)
WBC: 3.8 10*3/uL — ABNORMAL LOW (ref 4.0–10.5)
nRBC: 0 % (ref 0.0–0.2)

## 2020-05-02 MED ORDER — SULFUR HEXAFLUORIDE MICROSPH 60.7-25 MG IV SUSR
5.0000 mL | Freq: Once | INTRAVENOUS | Status: AC | PRN
  Administered 2020-05-02: 7.5 mL via INTRAVENOUS

## 2020-05-02 MED ORDER — MIDAZOLAM HCL 2 MG/2ML IJ SOLN
INTRAMUSCULAR | Status: AC
  Filled 2020-05-02: qty 2

## 2020-05-02 MED ORDER — FENTANYL CITRATE (PF) 100 MCG/2ML IJ SOLN
INTRAMUSCULAR | Status: AC
  Filled 2020-05-02: qty 2

## 2020-05-02 MED ORDER — SODIUM CHLORIDE 0.9 % IV SOLN: INTRAVENOUS | Status: DC

## 2020-05-02 MED ORDER — SODIUM CHLORIDE FLUSH 0.9 % IV SOLN
INTRAVENOUS | Status: AC
  Filled 2020-05-02: qty 10

## 2020-05-02 MED ORDER — SULFUR HEXAFLUORIDE MICROSPH 60.7-25 MG IV SUSR
5.0000 mL | Freq: Once | INTRAVENOUS | Status: AC | PRN
  Administered 2020-05-02: 5 mL via INTRAVENOUS

## 2020-05-02 NOTE — Telephone Encounter (Signed)
-----   Message from Rickard Patience, MD sent at 05/02/2020  4:29 PM EST ----- Regarding: RE: Yes. It was not done. Probably benign liver lesion. ----- Message ----- From: Coralee Rud, RN Sent: 05/02/2020   4:27 PM EST To: Coralee Rud, RN, Guerry Minors, CMA, #  Pt had biopsy done this morning, and you wanted to see her 2 weeks after, which she is scheduled to see you on 1/5. Did you want to cancel biospy results follow up?  ----- Message ----- From: Rickard Patience, MD Sent: 05/02/2020   4:20 PM EST To: Coralee Rud, RN, Guerry Minors, CMA  Please arrange her to see me in 6 months, lab md cbc cmp CEA thanks.

## 2020-05-02 NOTE — H&P (Signed)
Chief Complaint: Patient was seen in consultation today for indeterminate hepatic masses  Referring Physician(s): Yu,Zhou  Patient Status: [facility] - Out-pt  History of Present Illness: Susan Weeks is a 69 y.o. female with history of colon cancer status post right hemicolectomy in 2011 who recently developed elevated transaminases and was found to have a 3 cm hyperechoic mass in the dome of the right liver on 03/19/20 RUQ ultrasound.  This prompted an MRI abdomen on 04/04/20 which showed an additional left hepatic mass in addition to the mass in the dome of the liver, both of which restrict diffusion and show arterial hyper-enhancement with possible washout.  PET/CT was then pursued per IR recommendations prior to biopsy, which clouded the picture as the masses were not hypermetabolic.  No other evidence of local recurrence or metastasis.  Today she feels well denying fever, chills, shortness of breath, chest pain, abdominal pain, nausea, vomiting, jaundice.    Past Medical History:  Diagnosis Date  . Abnormal liver function   . Colon cancer (HCC)    adenocarcinoma of the cecum s/p partial colon resection  . History of colon polyps   . Hyperlipidemia   . Hypertension     Past Surgical History:  Procedure Laterality Date  . ABDOMINAL HYSTERECTOMY  1984   secondary to bleeding and fibroid tumors  . APPENDECTOMY    . COLONOSCOPY W/ POLYPECTOMY  2001  . COLONOSCOPY WITH PROPOFOL N/A 07/28/2016   Procedure: COLONOSCOPY WITH PROPOFOL;  Surgeon: Christena Deem, MD;  Location: [facility] END[facility]COPY;  Service: Endoscopy;  Laterality: N/A;    Allergies: Z-pak [azithromycin]  Medications: Prior to Admission medications   Medication Sig Start Date End Date Taking? Authorizing Provider  amLODipine (NORVASC) 5 MG tablet TAKE 1 TABLET BY MOUTH EVERY DAY 03/14/20  Yes Dale Wamsutter, MD  aspirin EC 81 MG tablet Take 1 tablet (81 mg total) by mouth daily. 10/16/17  Yes Rickard Patience, MD  calcium  carbonate (OS-CAL) 600 MG TABS Take 600 mg by mouth daily.    Yes [provider]  lisinopril (ZESTRIL) 30 MG tablet TAKE 1 TABLET BY MOUTH EVERY DAY 03/12/20  Yes Dale Grandin, MD  rosuvastatin (CRESTOR) 5 MG tablet TAKE 1 TABLET BY MOUTH EVERY DAY 03/19/20  Yes Sherlene Shams, MD  vitamin E 400 UNIT capsule Take 400 Units by mouth daily.    Yes [provider]  nystatin cream (MYCOSTATIN) Apply 1 application topically 2 (two) times daily. Patient not taking: Reported on 05/02/2020 03/06/20   Dale [place], MD     Family History  Problem Relation Age of Onset  . Stroke Mother   . Hypertension Mother   . Cancer Father        Prostate   . Heart disease Brother        myocardial infarction  . Hypertension Brother   . Hypertension Sister        x4  . Breast cancer Neg Hx     Social History   Socioeconomic History  . Marital status: Married    Spouse name: Link Snuffer  . Number of children: 1  . Years of education: HS  . Highest education level: Not on file  Occupational History    Employer: OTHER    Comment: [employer]  Tobacco Use  . Smoking status: Former Smoker    Quit date: 1978    Years since quitting: 44.0  . Smokeless tobacco: Never Used  Vaping Use  . Vaping Use: Never used  Substance and Sexual Activity  . Alcohol use: No    Alcohol/week: 0.0 standard drinks  . Drug use: No  . Sexual activity: Not on file  Other Topics Concern  . Not on file  Social History Narrative  . Not on file   Social Determinants of Health   Financial Resource Strain: Not on file  Food Insecurity: Not on file  Transportation Needs: Not on file  Physical Activity: Not on file  Stress: Not on file  Social Connections: Not on file    Review of Systems: A 12 point ROS discussed and pertinent positives are indicated in the HPI above.  All other systems are negative.  Vital Signs: BP (!) 161/74   Pulse 85   Temp 98.7 F (37.1 C) (Oral)   Resp 16   LMP  06/28/1982   SpO2 100%   Physical Exam Constitutional:      General: She is not in acute distress.    Appearance: Normal appearance.  HENT:     Head: Normocephalic.     Mouth/Throat:     Mouth: Mucous membranes are moist.     Comments: MP2  Eyes:     General: No scleral icterus. Cardiovascular:     Rate and Rhythm: Normal rate and regular rhythm.     Heart sounds: Normal heart sounds.  Pulmonary:     Effort: Pulmonary effort is normal.     Breath sounds: Normal breath sounds.  Abdominal:     General: There is no distension.     Palpations: Abdomen is soft.  Skin:    General: Skin is warm and dry.  Neurological:     Mental Status: She is alert and oriented to person, place, and time.     Imaging: MR Abdomen W Wo Contrast  Result Date: 04/05/2020 CLINICAL DATA:  Liver lesion, indeterminate abnormality based on recent sonogram from March 19, 2020 EXAM: MRI ABDOMEN WITHOUT AND WITH CONTRAST TECHNIQUE: Multiplanar multisequence MR imaging of the abdomen was performed both before and after the administration of intravenous contrast. CONTRAST:  68mL GADAVIST GADOBUTROL 1 MMOL/ML IV SOLN COMPARISON:  Sonogram of March 19, 2020, CT evaluation from July 12, 2007 FINDINGS: Lower chest: Incidental imaging of the lung bases is unremarkable by MRI. Hepatobiliary: Focal hepatic lesions, lesion near the dome of the RIGHT hemi liver near the IVC confluence measuring 2.3 x 1.9 cm showing hypervascular features and signs of potential washout and capsule appearance, these features are best demonstrated on image 16 of series 16, 90 second delay. Second lesion in the lateral segment of the LEFT hepatic lobe with similar imaging characteristics (image 21, series 11) note that both of these lesions on the 45 seconds and are nearly occult. Some of this could be due to artifact related to organ and lesion position relative to lung and heart and stomach. No additional focal suspicious hepatic lesion.  Lesions display signs of restricted diffusion. There is another subtle focus that is not seen on other imaging sequences, seen on diffusion measuring 5 mm near the porta hepatis. The portal vein is patent. Hepatic veins are patent. Liver contour is smooth and there is no significant steatosis or iron deposition. Normal appearance of the gallbladder and biliary tree. Pancreas: Normal intrinsic T1 signal. No focal pancreatic lesion, ductal dilation or sign of inflammation. Spleen:  Normal Adrenals/Urinary Tract: Adrenal glands are normal. No hydronephrosis or suspicious renal lesion. Stomach/Bowel: Foreshortening of the RIGHT colon compatible with partial colectomy and ileocolonic anastomosis. Vascular/Lymphatic: Vascular structures  in the abdomen are patent without aneurysmal dilation. No retroperitoneal adenopathy. On coronal imaging ileal lymph nodes are mildly enlarged, cannot be seen in the axial plane with respect to the largest lymph node that is seen on image 19 of series 3. This lymph node measures approximately 1.8 cm in greatest coronal dimension. Other: No ascites. Potential solid RIGHT adnexal lesion measuring 2.6 x 2.4 cm seen on image 10 of series 3, also seen on image 20 of series 18. Musculoskeletal: No suspicious bone lesions identified. IMPRESSION: 1. Focal hepatic lesions in the RIGHT and LEFT hepatic lobes as described. Lesions in the RIGHT and LEFT lobes of the liver have similar imaging characteristics and are suspicious for metastatic disease in this patient with reported history of colon cancer. Diffusion characteristics and limited visualization on the 45 second phase of the evaluation could also be seen in the setting of neuroendocrine tumor. Suggest biochemical correlation and consider PET for further evaluation. 2. Potential solid RIGHT adnexal lesion measuring 2.6 x 2.4 cm. Potential ovarian neoplasm. Pelvic ultrasound is suggested for further assessment. 3. Post partial colectomy with  ileocolonic anastomosis. Mildly enlarged lymph nodes throughout the mesentery the largest which cannot be imaged in the axial plane, attention on PET imaging to this area and also the ovary could potentially be helpful. Electronically Signed   By: Zetta Bills M.D.   On: 04/05/2020 11:03   NM PET Image Initial (PI) Skull Base To Thigh  Result Date: 04/24/2020 CLINICAL DATA:  Subsequent treatment strategy for colon cancer. EXAM: NUCLEAR MEDICINE PET SKULL BASE TO THIGH TECHNIQUE: 8.3 mCi F-18 FDG was injected intravenously. Full-ring PET imaging was performed from the skull base to thigh after the radiotracer. CT data was obtained and used for attenuation correction and anatomic localization. Fasting blood glucose: 90 mg/dl COMPARISON:  Multiple exams, including MRI abdomen from 04/04/2020 FINDINGS: Mediastinal blood pool activity: SUV max 2.3 Liver activity: SUV max NA NECK: No significant abnormal hypermetabolic activity in this region. Incidental CT findings: Bilateral common carotid atherosclerotic calcification. CHEST: No significant abnormal hypermetabolic activity in this region. Incidental CT findings: Mild thoracic aortic atherosclerotic calcification. Mild cardiomegaly. Left anterior descending coronary artery atherosclerosis. ABDOMEN/PELVIS: Neither of the liver lesions shown on MRI demonstrate hypermetabolic activity on today's PET-CT. These lesions are faintly visible on the CT data as hypodensities, for example on image 111 of series 3 and image 118 of series 3. The 0.9 cm right eccentric mesenteric node on image 183 of series 3 has maximum SUV of 1.4, substantially less than blood pool. No abnormal hypermetabolic activity along the adnexa; there appears to be a loop of distal small bowel tracking along the right adnexal margin. Incidental CT findings: Right hemicolectomy. Gastric diverticulum. Aortoiliac atherosclerotic vascular disease. Scattered sigmoid colon diverticula. SKELETON: No  significant abnormal hypermetabolic activity in this region. Incidental CT findings: none IMPRESSION: 1. The liver lesions are not hypermetabolic. Given the lack of hypermetabolic activity and given that these have faintly increased T2 signal and are most conspicuous in the arterial phase of contrast administration where they enhance, but less conspicuous on later phases, I tend to favor these as probably representing benign focal nodular hyperplasia. Consider follow hepatic protocol MRI specifically using Eovist contrast medium in 6 months time for confirmation. 2. Other imaging findings of potential clinical significance: Aortic Atherosclerosis (ICD10-I70.0). Coronary atherosclerosis. Scattered sigmoid colon diverticula. Electronically Signed   By: Van Clines M.D.   On: 04/24/2020 17:05   US PELVIC COMPLETE WITH TRANSVAGINAL  Result Date:  04/16/2020 CLINICAL DATA:  Adnexal mass on MRI abdomen; postmenopausal EXAM: TRANSABDOMINAL AND TRANSVAGINAL ULTRASOUND OF PELVIS TECHNIQUE: Both transabdominal and transvaginal ultrasound examinations of the pelvis were performed. Transabdominal technique was performed for global imaging of the pelvis including uterus, ovaries, adnexal regions, and pelvic cul-de-sac. It was necessary to proceed with endovaginal exam following the transabdominal exam to visualize the ovaries. COMPARISON:  None Correlation: MR abdomen 04/04/2020 FINDINGS: Uterus Surgically absent Endometrium N/A Right ovary Questionably visualized, see below Left ovary Surgically absent Other findings No free fluid. Two tiny cystic foci are seen within the RIGHT adnexa, may represent small follicles or cysts within the ovary, potentially noted on prior MR as well. These measure approximately 6 mm and 5 mm in diameter. An adjacent 3.4 x 2.7 x 2.4 cm diameter heterogeneous focus is identified with significant posterior shadowing, uncertain if represents a mass or stool within bowel. This is larger than  the questioned abnormality on the preceding MR. IMPRESSION: Questionable visualization of a tiny RIGHT ovary containing 6 mm and 5 mm cysts; no follow-up imaging recommended. Questionable 3.4 cm diameter shadowing mass in RIGHT pelvis versus artifact related to stool within the colon; recommend dedicated CT imaging of the pelvis with IV contrast to assess. Electronically Signed   By: Lavonia Dana M.D.   On: 04/16/2020 17:30    Labs:  CBC: Recent Labs    07/26/19 0908 03/06/20 0911 04/11/20 1315 05/02/20 0938  WBC 3.5* 3.9* 3.6* 3.8*  HGB 14.8 15.3* 15.1* 15.9*  HCT 45.0 45.5 43.8 47.5*  PLT 209 234.0 203 217    COAGS: Recent Labs    05/02/20 0938  INR 1.0    BMP: Recent Labs    07/26/19 0908 11/04/19 0935 03/06/20 0911 04/11/20 1315  NA 139 138 139 136  K 4.1 4.2 4.0 4.3  CL 104 102 102 101  CO2 27 31 29 23   GLUCOSE 89 94 91 113*  BUN 16 19 18 18   CALCIUM 9.1 9.9 9.6 9.3  CREATININE 0.82 0.92 0.92 0.90  GFRNONAA >60  --   --  >60  GFRAA >60  --   --   --     LIVER FUNCTION TESTS: Recent Labs    07/26/19 0908 11/04/19 0935 03/06/20 0911 04/11/20 1315  BILITOT 0.9 0.9 1.0 1.3*  AST 25 25 27 22   ALT 34 37* 39* 28  ALKPHOS 48 57 60 44  PROT 7.3 7.2 7.4 7.4  ALBUMIN 4.6 4.7 4.6 4.5    TUMOR MARKERS: Recent Labs    03/06/20 0911  CEA 2.7*    Assessment and Plan:  69 year old female with history of colon cancer status post right hemicolectomy in 2011 presenting with indeterminate hepatic masses, worrisome for metastasis, though not hypermetabolic on PET-CT.  Plan for diagnostic contrast enhanced ultrasound followed by ultrasound guided liver mass biopsy of the most favorable hepatic mass with moderate sedation.  Risks and benefits of liver biopsy was discussed with the patient including, but not limited to bleeding, infection, damage to adjacent structures or low yield requiring additional tests.  All of the questions were answered and there is  agreement to proceed.  Consent signed and in chart.  Thank you for this interesting consult.  I greatly enjoyed meeting AMAN NOURSE and look forward to participating in their care.   Electronically Signed: Suzette Battiest, MD 05/02/2020, 10:48 AM   I spent a total of 30 Minutes  in face to face in clinical consultation, greater  than 50% of which was counseling/coordinating care for liver mass biopsy.

## 2020-05-02 NOTE — Telephone Encounter (Signed)
Susan Weeks please cancel appt on 1/5 and schedule lab/MD in 6 months (cbc,cmp,cea). Please notify pt of appts.

## 2020-05-02 NOTE — Progress Notes (Signed)
Contrast enhanced ultrasound performed.  No evidence of lesion in the left lobe.  Right hepatic dome mass compatible with flash filling hemangioma, therefore no biopsy performed.  Imaging review is below.  In retrospect - this lesion was likely present on CT abdomen from 2009:   US abdomen 03/19/20:    MRI abdomen 04/04/20: Enhancing mass with restricted diffusion.  Questionable washout.    PET/CT 04/24/20: No focal hypermetabolism in liver.    CEUS 05/02/20: Flash filling diffuse enhancement, maximum enhancement at 5 seconds.  Return to normal background parenchyma at 16 seconds.  No washout.      Follow up CEUS in 6-12 months could be considered for surveillance.   Marliss Coots, MD Pager: [phone]

## 2020-05-02 NOTE — Sedation Documentation (Signed)
lumason injections x 3 for US liver contrast study. At this time no biopsy needed for Dr Elby Showers pt to return to [facility] for discharge, No symptoms for reaction.

## 2020-05-03 NOTE — Telephone Encounter (Signed)
Done..  Lab/MD in appt has been sched as requested Pt is aware

## 2020-05-05 DIAGNOSIS — I2510 Atherosclerotic heart disease of native coronary artery without angina pectoris: Secondary | ICD-10-CM

## 2020-05-05 HISTORY — DX: Atherosclerotic heart disease of native coronary artery without angina pectoris: I25.10

## 2020-05-08 ENCOUNTER — Telehealth: Payer: Self-pay | Admitting: Internal Medicine

## 2020-05-08 NOTE — Telephone Encounter (Signed)
-----   Message from Rickard Patience, MD sent at 04/26/2020  8:02 AM EST ----- Regarding: RE: question Thank you for the update.  ----- Message ----- From: Dale [place], MD Sent: 04/24/2020   2:34 AM EST To: Rickard Patience, MD Subject: question                                       You saw Ms Gagan recently for evaluation of an abnormal MRI.  She has a history of colon cancer and there was concern regarding possible liver mets.  You have ordered a PET scan.  I ordered a pelvic ultrasound to evaluate a possible adnexal mass.  The pelvic ultrasound revealed a questionable 3.4 cm diameter shadowing mass in RIGHT pelvis versus artifact related to stool within the colon.  It was recommend for her to have a dedicated CT imaging of the pelvis with IV contrast to assess.  Given that she is scheduled for a PET scan, I was going to hold off on ordering the CT (at this time).  Let me know if there is something more you recommend.    Thank you for your help Westley Hummer

## 2020-05-09 ENCOUNTER — Ambulatory Visit: Payer: BC Managed Care – PPO | Admitting: Oncology

## 2020-06-08 ENCOUNTER — Other Ambulatory Visit: Payer: Self-pay | Admitting: Internal Medicine

## 2020-07-04 ENCOUNTER — Ambulatory Visit (INDEPENDENT_AMBULATORY_CARE_PROVIDER_SITE_OTHER): Payer: BC Managed Care – PPO | Admitting: Internal Medicine

## 2020-07-04 ENCOUNTER — Other Ambulatory Visit: Payer: Self-pay

## 2020-07-04 DIAGNOSIS — E7800 Pure hypercholesterolemia, unspecified: Secondary | ICD-10-CM | POA: Diagnosis not present

## 2020-07-04 DIAGNOSIS — I10 Essential (primary) hypertension: Secondary | ICD-10-CM | POA: Diagnosis not present

## 2020-07-04 DIAGNOSIS — R739 Hyperglycemia, unspecified: Secondary | ICD-10-CM

## 2020-07-04 DIAGNOSIS — R0602 Shortness of breath: Secondary | ICD-10-CM | POA: Diagnosis not present

## 2020-07-04 DIAGNOSIS — G4730 Sleep apnea, unspecified: Secondary | ICD-10-CM

## 2020-07-04 DIAGNOSIS — R945 Abnormal results of liver function studies: Secondary | ICD-10-CM

## 2020-07-04 DIAGNOSIS — D582 Other hemoglobinopathies: Secondary | ICD-10-CM

## 2020-07-04 DIAGNOSIS — R1900 Intra-abdominal and pelvic swelling, mass and lump, unspecified site: Secondary | ICD-10-CM

## 2020-07-04 DIAGNOSIS — L989 Disorder of the skin and subcutaneous tissue, unspecified: Secondary | ICD-10-CM

## 2020-07-04 DIAGNOSIS — Z85038 Personal history of other malignant neoplasm of large intestine: Secondary | ICD-10-CM

## 2020-07-04 DIAGNOSIS — D72819 Decreased white blood cell count, unspecified: Secondary | ICD-10-CM

## 2020-07-04 NOTE — Progress Notes (Signed)
[name]: Susan Weeks, female   DOB: Jun 29, 1950, 70 y.o.   MRN: [id number]   Subjective:    [name]: Susan Weeks, female    DOB: 1950/08/31, 70 y.o.   MRN: [id number]  HPI This visit occurred during the SARS-CoV-2 public health emergency.  Safety protocols were in place, including screening questions prior to the visit, additional usage of staff PPE, and extensive cleaning of exam room while observing appropriate contact time as indicated for disinfecting solutions.  Patient here for a scheduled follow up.  Here to follow up regarding her blood pressure and blood sugar.  Recently worked up for liver lesion.  Has PET 04/24/20 - radiology felt favor - probably benign focal nodule hyperplasia.  Did not perform liver biopsy.  Recommended f/u hepatic protocol MRI in 6 months.  She is eating and drinking ok.  No nausea or vomiting.  Bowels stable.  Does report noticing gets a little winded when rushing.  No chest pain.  No cough or congestion.  Left hip and leg lesion.    Past Medical History:  Diagnosis Date  . Abnormal liver function   . Colon cancer ([place])    adenocarcinoma of the cecum s/p partial colon resection  . History of colon polyps   . Hyperlipidemia   . Hypertension    Past Surgical History:  Procedure Laterality Date  . ABDOMINAL HYSTERECTOMY  1984   secondary to bleeding and fibroid tumors  . APPENDECTOMY    . COLONOSCOPY W/ POLYPECTOMY  2001  . COLONOSCOPY WITH PROPOFOL N/A 07/28/2016   Procedure: COLONOSCOPY WITH PROPOFOL;  Surgeon: [name], MD;  Location: [facility] ENDOSCOPY;  Service: Endoscopy;  Laterality: N/A;   Family History  Problem Relation Age of Onset  . Stroke Mother   . Hypertension Mother   . Cancer Father        Prostate   . Heart disease Brother        myocardial infarction  . Hypertension Brother   . Hypertension Sister        x4  . Breast cancer Neg Hx    Social History   Socioeconomic History  . Marital status: Married     Spouse name: [name]  . Number of children: 1  . Years of education: HS  . Highest education level: Not on file  Occupational History    Employer: OTHER    Comment: [employer]  Tobacco Use  . Smoking status: Former Smoker    Quit date: 1978    Years since quitting: 44.2  . Smokeless tobacco: Never Used  Vaping Use  . Vaping Use: Never used  Substance and Sexual Activity  . Alcohol use: No    Alcohol/week: 0.0 standard drinks  . Drug use: No  . Sexual activity: Not on file  Other Topics Concern  . Not on file  Social History Narrative  . Not on file   Social Determinants of Health   Financial Resource Strain: Not on file  Food Insecurity: Not on file  Transportation Needs: Not on file  Physical Activity: Not on file  Stress: Not on file  Social Connections: Not on file    Outpatient Encounter Medications as of 07/04/2020  Medication Sig  . amLODipine (NORVASC) 5 MG tablet TAKE 1 TABLET BY MOUTH EVERY DAY  . aspirin EC 81 MG tablet Take 1 tablet (81 mg total) by mouth daily.  . calcium carbonate (OS-CAL) 600 MG TABS Take 600 mg by mouth daily.   [name]  lisinopril (ZESTRIL) 30 MG tablet TAKE 1 TABLET BY MOUTH EVERY DAY  . nystatin cream (MYCOSTATIN) Apply 1 application topically 2 (two) times daily.  . rosuvastatin (CRESTOR) 5 MG tablet TAKE 1 TABLET BY MOUTH EVERY DAY  . vitamin E 400 UNIT capsule Take 400 Units by mouth daily.    No facility-administered encounter medications on file as of 07/04/2020.    Review of Systems  Constitutional: Negative for appetite change and unexpected weight change.  HENT: Negative for congestion and sinus pressure.   Respiratory: Negative for cough and chest tightness.        SOB with exertion.    Cardiovascular: Negative for chest pain, palpitations and leg swelling.  Gastrointestinal: Negative for abdominal pain, diarrhea, nausea and vomiting.  Genitourinary: Negative for difficulty urinating and dysuria.  Musculoskeletal: Negative  for joint swelling and myalgias.  Skin: Negative for color change and rash.  Neurological: Negative for dizziness, light-headedness and headaches.  Psychiatric/Behavioral: Negative for agitation and dysphoric mood.       Objective:    Physical Exam Vitals reviewed.  Constitutional:      General: She is not in acute distress.    Appearance: Normal appearance.  HENT:     Head: Normocephalic and atraumatic.     Right Ear: External ear normal.     Left Ear: External ear normal.     Mouth/Throat:     Mouth: Oropharynx is clear and moist.  Eyes:     General: No scleral icterus.       Right eye: No discharge.        Left eye: No discharge.     Conjunctiva/sclera: Conjunctivae normal.  Neck:     Thyroid: No thyromegaly.  Cardiovascular:     Rate and Rhythm: Normal rate and regular rhythm.  Pulmonary:     Effort: No respiratory distress.     Breath sounds: Normal breath sounds. No wheezing.  Abdominal:     General: Bowel sounds are normal.     Palpations: Abdomen is soft.     Tenderness: There is no abdominal tenderness.  Musculoskeletal:        General: No swelling, tenderness or edema.     Cervical back: Neck supple. No tenderness.  Lymphadenopathy:     Cervical: No cervical adenopathy.  Skin:    Findings: No erythema or rash.  Neurological:     Mental Status: She is alert.  Psychiatric:        Mood and Affect: Mood normal.        Behavior: Behavior normal.     BP 108/68   Pulse 87   Temp 98.3 F (36.8 C) (Oral)   Resp 16   Ht 5' 3"  (1.6 m)   Wt 182 lb 9.6 oz (82.8 kg)   LMP 06/28/1982   SpO2 98%   BMI 32.35 kg/m  Wt Readings from Last 3 Encounters:  07/04/20 182 lb 9.6 oz (82.8 kg)  04/11/20 180 lb 1.6 oz (81.7 kg)  04/05/20 183 lb (83 kg)     Lab Results  Component Value Date   WBC 3.8 (L) 05/02/2020   HGB 15.9 (H) 05/02/2020   HCT 47.5 (H) 05/02/2020   PLT 217 05/02/2020   GLUCOSE 113 (H) 04/11/2020   CHOL 165 03/06/2020   TRIG 81.0 03/06/2020    HDL 64.00 03/06/2020   LDLDIRECT 150.8 03/02/2013   LDLCALC 85 03/06/2020   ALT 28 04/11/2020   AST 22 04/11/2020   NA 136 04/11/2020  K 4.3 04/11/2020   CL 101 04/11/2020   CREATININE 0.90 04/11/2020   BUN 18 04/11/2020   CO2 23 04/11/2020   TSH 1.14 07/21/2019   INR 1.0 05/02/2020   HGBA1C 6.0 03/06/2020    US LIVER W/CM 1ST LESION  Result Date: 05/02/2020 CLINICAL DATA:  70 year old female with remote history of colon cancer status post right hemicolectomy in 2011. Incidentally observed right dome hepatic mass on liver ultrasound in the setting of transaminitis. Further workup with MRI demonstrated 2 enhancing masses, 1 in the hepatic dome and 1 in the left lobe which also restricted diffusion, concerning for metastatic lesions. Further evaluation with PET-CT demonstrated no evidence of hypermetabolism in these masses. The patient presents today for contrast enhanced ultrasound for further evaluation and possible biopsy. EXAM: US LIVER WITH CONTRAST EACH ADDITIONAL LESION CONTRAST:  5 mL sulfur hexafluoride miscropheres (Lumason), intravenous Number of injections: 3 COMPARISON:  CT abdomen from 07/22/2007, ultrasound abdomen from 03/19/2020, MRI abdomen from 04/04/2020, and PET-CT from 04/24/2020. FINDINGS: LIVER Size: Normal. Echogenicity: Within normal limits. Surface Contour: Smooth. Observation 1 Lobe: Right Size: 2.4 x 2.9 x 3.9 cm Arterial phase enhancement features: Diffuse, very early within 5 seconds. Enhancement returned to baseline parenchyma within 16 seconds. No washout. Onset of washout: Not applicable Degree of washout: Not applicable Tumor in vein: Not applicable The left hepatic mass visualized on comparison MRI is not sonographically apparent on grayscale or with contrast. Two attempts were made at contrast administration for evaluation of the left lobe, however no discrete mass was visualized. BILIARY SYSTEM Gallbladder: Mildly distended. No gallbladder wall thickening or  pericholecystic fluid. No cholelithiasis. Intrahepatic bile ducts: Within normal limits, nondilated. Common bile duct: 0.32 cm FLUID No ascites. VASCULATURE Main portal vein: Patent with antegrade flow. Right portal vein: Patent with antegrade flow. Left portal vein: Patent with antegrade flow. Hepatic veins: Patent. IMPRESSION: 1. Hypoechoic mass in the dome of the right lobe of the liver measuring up to 3.9 cm which demonstrates very early, flash filling diffuse enhancement without evidence of washout. These findings are most compatible with a flash filling hemangioma. No biopsy of this lesion was performed given suggestion benign etiology. In retrospect, there is a hypoattenuating focus in this region on comparison CT from 2009, further evidence of benignity. 2. The left hepatic lesion visualized on recent MRI is not conspicuous sonographically, either grayscale or with contrast enhancement. 3. Otherwise normal appearance of the right upper quadrant without acute abnormality. Consider 6 months or 1 year follow-up contrast-enhanced ultrasound (MRI abdomen would also be reasonable) for observation. Ruthann Cancer, MD [facility] Radiology Electronically Signed   By: Ruthann Cancer MD   On: 05/02/2020 13:17   US LIVER W/CM EA ADDT'L LESION  Result Date: 05/02/2020 CLINICAL DATA:  70 year old female with remote history of colon cancer status post right hemicolectomy in 2011. Incidentally observed right dome hepatic mass on liver ultrasound in the setting of transaminitis. Further workup with MRI demonstrated 2 enhancing masses, 1 in the hepatic dome and 1 in the left lobe which also restricted diffusion, concerning for metastatic lesions. Further evaluation with PET-CT demonstrated no evidence of hypermetabolism in these masses. The patient presents today for contrast enhanced ultrasound for further evaluation and possible biopsy. EXAM: US LIVER WITH CONTRAST EACH  ADDITIONAL LESION CONTRAST:  5 mL sulfur hexafluoride miscropheres (Lumason), intravenous Number of injections: 3 COMPARISON:  CT abdomen from 07/22/2007, ultrasound abdomen from 03/19/2020, MRI abdomen from 04/04/2020, and PET-CT from 04/24/2020.  FINDINGS: LIVER Size: Normal. Echogenicity: Within normal limits. Surface Contour: Smooth. Observation 1 Lobe: Right Size: 2.4 x 2.9 x 3.9 cm Arterial phase enhancement features: Diffuse, very early within 5 seconds. Enhancement returned to baseline parenchyma within 16 seconds. No washout. Onset of washout: Not applicable Degree of washout: Not applicable Tumor in vein: Not applicable The left hepatic mass visualized on comparison MRI is not sonographically apparent on grayscale or with contrast. Two attempts were made at contrast administration for evaluation of the left lobe, however no discrete mass was visualized. BILIARY SYSTEM Gallbladder: Mildly distended. No gallbladder wall thickening or pericholecystic fluid. No cholelithiasis. Intrahepatic bile ducts: Within normal limits, nondilated. Common bile duct: 0.32 cm FLUID No ascites. VASCULATURE Main portal vein: Patent with antegrade flow. Right portal vein: Patent with antegrade flow. Left portal vein: Patent with antegrade flow. Hepatic veins: Patent. IMPRESSION: 1. Hypoechoic mass in the dome of the right lobe of the liver measuring up to 3.9 cm which demonstrates very early, flash filling diffuse enhancement without evidence of washout. These findings are most compatible with a flash filling hemangioma. No biopsy of this lesion was performed given suggestion benign etiology. In retrospect, there is a hypoattenuating focus in this region on comparison CT from 2009, further evidence of benignity. 2. The left hepatic lesion visualized on recent MRI is not conspicuous sonographically, either grayscale or with contrast enhancement. 3. Otherwise normal appearance of the right upper quadrant without acute abnormality.  Consider 6 months or 1 year follow-up contrast-enhanced ultrasound (MRI abdomen would also be reasonable) for observation. Ruthann Cancer, MD [facility] Radiology Electronically Signed   By: Ruthann Cancer MD   On: 05/02/2020 13:17       Assessment & Plan:   Problem List Items Addressed This Visit    Abnormal liver function    Just had w/up for liver lesion as outlined.  Follow liver function tests.  Recommended f/u MRI in 6 months (last scan 06/2020).  Followed by hematology.        Elevated hemoglobin (HCC)    Last hgb 15.9.  Followed by hematology.        Essential hypertension, benign    Blood pressure doing well.  Continue amlodipine and lisinopril.  Follow pressures.  Follow metabolic panel.       Relevant Orders   TSH   Basic metabolic panel   History of colon cancer    Colonoscopy 10/2019 - recommended f/u in 3 years.       Hyperglycemia    Low carb diet and exercise.  Follow met b and a1c.       Relevant Orders   Hemoglobin A1c   Leukopenia    Follow cbc.       Pelvic mass    Noted on pelvic ultrasound.  Seeing oncology.  Had PET as outlined.  No mention of pelvic abnormality.  Continue f/u with oncology.       Pure hypercholesterolemia    On crestor.  Low cholesterol diet and exercise.  Follow lipid panel and liver function tests.        Relevant Orders   Lipid panel   Hepatic function panel   Skin lesion    Skin lesion left leg/ hip.  Refer to dermatology.        Relevant Orders   Ambulatory referral to Dermatology   Sleep apnea    Continue cpap.       SOB (shortness of breath)    Some  windedness noticed with rushing.  No chest pain.  EKG - SR with no acute ischemic changes.  Discussed further w/up.  She relates to deconditioning.  Wants to monitor.  Wants to hold on further cardiac testing.  Follow.  Notify me if change in symptoms.        Relevant Orders   EKG 12-Lead (Completed)        [name], MD

## 2020-07-09 ENCOUNTER — Encounter: Payer: Self-pay | Admitting: Internal Medicine

## 2020-07-09 DIAGNOSIS — R1900 Intra-abdominal and pelvic swelling, mass and lump, unspecified site: Secondary | ICD-10-CM | POA: Insufficient documentation

## 2020-07-09 DIAGNOSIS — L989 Disorder of the skin and subcutaneous tissue, unspecified: Secondary | ICD-10-CM | POA: Insufficient documentation

## 2020-07-09 NOTE — Assessment & Plan Note (Signed)
Noted on pelvic ultrasound.  Seeing oncology.  Had PET as outlined.  No mention of pelvic abnormality.  Continue f/u with oncology.

## 2020-07-09 NOTE — Assessment & Plan Note (Signed)
Follow cbc.  

## 2020-07-09 NOTE — Assessment & Plan Note (Signed)
Blood pressure doing well.  Continue amlodipine and lisinopril.  Follow pressures.  Follow metabolic panel.

## 2020-07-09 NOTE — Assessment & Plan Note (Signed)
On crestor.  Low cholesterol diet and exercise.  Follow lipid panel and liver function tests.   

## 2020-07-09 NOTE — Assessment & Plan Note (Signed)
Last hgb 15.9.  Followed by hematology.

## 2020-07-09 NOTE — Assessment & Plan Note (Signed)
Continue cpap.  

## 2020-07-09 NOTE — Assessment & Plan Note (Addendum)
Skin lesion left leg/ hip.  Refer to dermatology.

## 2020-07-09 NOTE — Assessment & Plan Note (Signed)
Just had w/up for liver lesion as outlined.  Follow liver function tests.  Recommended f/u MRI in 6 months (last scan 06/2020).  Followed by hematology.

## 2020-07-09 NOTE — Assessment & Plan Note (Signed)
Low carb diet and exercise.  Follow met b and a1c.  

## 2020-07-09 NOTE — Assessment & Plan Note (Signed)
Colonoscopy 10/2019 - recommended f/u in 3 years.  

## 2020-07-09 NOTE — Assessment & Plan Note (Signed)
Some windedness noticed with rushing.  No chest pain.  EKG - SR with no acute ischemic changes.  Discussed further w/up.  She relates to deconditioning.  Wants to monitor.  Wants to hold on further cardiac testing.  Follow.  Notify me if change in symptoms.

## 2020-07-13 ENCOUNTER — Other Ambulatory Visit: Payer: Self-pay | Admitting: Internal Medicine

## 2020-07-23 ENCOUNTER — Other Ambulatory Visit: Payer: BC Managed Care – PPO

## 2020-07-25 ENCOUNTER — Other Ambulatory Visit (INDEPENDENT_AMBULATORY_CARE_PROVIDER_SITE_OTHER): Payer: BC Managed Care – PPO

## 2020-07-25 ENCOUNTER — Other Ambulatory Visit: Payer: Self-pay

## 2020-07-25 ENCOUNTER — Ambulatory Visit: Payer: BC Managed Care – PPO | Admitting: Oncology

## 2020-07-25 DIAGNOSIS — E7800 Pure hypercholesterolemia, unspecified: Secondary | ICD-10-CM

## 2020-07-25 DIAGNOSIS — R739 Hyperglycemia, unspecified: Secondary | ICD-10-CM | POA: Diagnosis not present

## 2020-07-25 DIAGNOSIS — I10 Essential (primary) hypertension: Secondary | ICD-10-CM

## 2020-07-25 LAB — BASIC METABOLIC PANEL
BUN: 16 mg/dL (ref 6–23)
CO2: 27 mEq/L (ref 19–32)
Calcium: 8.8 mg/dL (ref 8.4–10.5)
Chloride: 101 mEq/L (ref 96–112)
Creatinine, Ser: 0.87 mg/dL (ref 0.40–1.20)
GFR: 67.37 mL/min (ref >60.00)
Glucose, Bld: 96 mg/dL (ref 70–99)
Potassium: 4.2 mEq/L (ref 3.5–5.1)
Sodium: 137 mEq/L (ref 135–145)

## 2020-07-25 LAB — HEPATIC FUNCTION PANEL
ALT: 31 U/L (ref 0–35)
AST: 20 U/L (ref 0–37)
Albumin: 4.5 g/dL (ref 3.5–5.2)
Alkaline Phosphatase: 63 U/L (ref 39–117)
Bilirubin, Direct: 0.2 mg/dL (ref 0.0–0.3)
Total Bilirubin: 0.7 mg/dL (ref 0.2–1.2)
Total Protein: 6.9 g/dL (ref 6.0–8.3)

## 2020-07-25 LAB — LIPID PANEL
Cholesterol: 147 mg/dL (ref 0–200)
HDL: 56.6 mg/dL (ref >39.00)
LDL Cholesterol: 77 mg/dL (ref 0–99)
NonHDL: 90.79
Total CHOL/HDL Ratio: 3
Triglycerides: 71 mg/dL (ref 0.0–149.0)
VLDL: 14.2 mg/dL (ref 0.0–40.0)

## 2020-07-25 LAB — TSH: TSH: 1.53 u[IU]/mL (ref 0.35–4.50)

## 2020-07-25 LAB — HEMOGLOBIN A1C: Hgb A1c MFr Bld: 5.9 % (ref 4.6–6.5)

## 2020-07-29 ENCOUNTER — Encounter: Payer: Self-pay | Admitting: Emergency Medicine

## 2020-07-29 ENCOUNTER — Other Ambulatory Visit: Payer: Self-pay

## 2020-07-29 ENCOUNTER — Telehealth: Payer: Self-pay | Admitting: Internal Medicine

## 2020-07-29 ENCOUNTER — Emergency Department
Admission: EM | Admit: 2020-07-29 | Discharge: 2020-07-29 | Disposition: A | Discharge status: Home / Self Care | Payer: BC Managed Care – PPO | Attending: Emergency Medicine | Admitting: Emergency Medicine

## 2020-07-29 DIAGNOSIS — L309 Dermatitis, unspecified: Secondary | ICD-10-CM | POA: Diagnosis not present

## 2020-07-29 DIAGNOSIS — L299 Pruritus, unspecified: Secondary | ICD-10-CM | POA: Diagnosis present

## 2020-07-29 DIAGNOSIS — R220 Localized swelling, mass and lump, head: Secondary | ICD-10-CM | POA: Insufficient documentation

## 2020-07-29 DIAGNOSIS — Z85038 Personal history of other malignant neoplasm of large intestine: Secondary | ICD-10-CM | POA: Insufficient documentation

## 2020-07-29 DIAGNOSIS — Z87891 Personal history of nicotine dependence: Secondary | ICD-10-CM | POA: Diagnosis not present

## 2020-07-29 DIAGNOSIS — Z79899 Other long term (current) drug therapy: Secondary | ICD-10-CM | POA: Diagnosis not present

## 2020-07-29 DIAGNOSIS — I10 Essential (primary) hypertension: Secondary | ICD-10-CM | POA: Insufficient documentation

## 2020-07-29 DIAGNOSIS — Z7982 Long term (current) use of aspirin: Secondary | ICD-10-CM | POA: Insufficient documentation

## 2020-07-29 MED ORDER — DIPHENHYDRAMINE HCL 25 MG PO CAPS
25.0000 mg | ORAL_CAPSULE | Freq: Once | ORAL | Status: AC
  Administered 2020-07-29: 25 mg via ORAL
  Filled 2020-07-29: qty 1

## 2020-07-29 MED ORDER — PREDNISONE 20 MG PO TABS
40.0000 mg | ORAL_TABLET | Freq: Once | ORAL | Status: AC
  Administered 2020-07-29: 40 mg via ORAL
  Filled 2020-07-29: qty 2

## 2020-07-29 MED ORDER — PREDNISONE 50 MG PO TABS: ORAL_TABLET | ORAL | 0 refills | Status: DC

## 2020-07-29 NOTE — Telephone Encounter (Signed)
Opened in error

## 2020-07-29 NOTE — ED Triage Notes (Signed)
Pt states woke up this morning with facial swelling, swelling noted to forehead, around eyes, and around chin. Pt states woke up in the night with itching, put vaseline on her forehead, and then woke up with swelling and slight weeping.   Pt states new bath and body works product she starting using this week, is unsure if contact with her face.   Pt denies difficulty breathing, denies difficulty swallowing. Pt is able to speak in full and complete sentences.

## 2020-07-29 NOTE — Discharge Instructions (Signed)
STOP Lisinopril (this can cause facial swelling)  Please follow-up with your doctor this week.  I recommend that you journal your blood pressures over the next few days and let your doctor know Monday about your visit and our discontinuation of her lisinopril.  You may need to have your blood pressure medications adjusted  Return to the ER right away if you develop increased swelling, swelling of the lips or mouth, difficulty breathing, weakness, or other new concerns arise.

## 2020-07-29 NOTE — ED Notes (Signed)
ED Provider at bedside. 

## 2020-07-29 NOTE — ED Notes (Signed)
No acute respiratory distress. Denies SOB. Denies chest pain.

## 2020-07-30 ENCOUNTER — Telehealth: Payer: Self-pay | Admitting: Internal Medicine

## 2020-07-30 NOTE — Telephone Encounter (Signed)
Patient went to the ED yesterday. The ED doctor told patient to stop taking her predniSONE (DELTASONE) 50 MG tablet because patient had swelling between eyes,  forehead, left eye really puffy. Please see note from ED visit at [facility] on 07/29/2020.

## 2020-07-30 NOTE — Telephone Encounter (Signed)
She was given prednisone to take for the swelling and was advised to stop lisinopril and monitor her BP over the next few days. ED provider advised for her to follow up with you. Do you want to work her in somewhere or do you want to have her monitor her BP until next week and follow up then since you just saw her last week.

## 2020-07-30 NOTE — Telephone Encounter (Signed)
Need to know how she is doing.  Has the swelling improved?  Any sob, tongue or lip swelling, etc?  If doing ok, then she can monitor her blood pressure, complete prednisone and I will see her next week. If any acute issues or problems, will need to work in this week.

## 2020-07-30 NOTE — Telephone Encounter (Signed)
Pt doing okay. No sob, tongue swelling, lip swelling. Facial swelling has improved some but she has only had one dose of the prednisone. She is going to continue and call with update on Wednesday.

## 2020-07-30 NOTE — ED Provider Notes (Signed)
[facility] Emergency Department Provider Note   ____________________________________________   Event Date/Time   First MD Initiated Contact with Patient 07/29/20 [phone]     (approximate)  I have reviewed the triage vital signs and the nursing notes.   HISTORY  Chief Complaint Allergic Reaction    HPI Susan Weeks is a 70 y.o. female the history of colon cancer hypertension hyperlipidemia.  The patient presents today, reports this morning and some last night she noticed that she started giving a bit of an itchiness across her hairline and then this morning also having some swelling over her forehead and a little bit around her left eye.  Reports area is itching.  No pain or discomfort.  No recent illness.  She does associate using a product on her forehead last night by bath and body type oil, and unsure if that made it happen.  She has never had swelling of her face before or around her left eye.  She does take medications including Norvasc and lisinopril  Denies chest pain no trouble breathing.  No wheezing her throat does not feel swollen her tongue does not feel swollen.  She is not itching anywhere else other than across her forehead     Past Medical History:  Diagnosis Date  . Abnormal liver function   . Colon cancer ([place])    adenocarcinoma of the cecum s/p partial colon resection  . History of colon polyps   . Hyperlipidemia   . Hypertension     Patient Active Problem List   Diagnosis Date Noted  . Skin lesion 07/09/2020  . Pelvic mass 07/09/2020  . SOB (shortness of breath) 07/04/2020  . Elevated hemoglobin ([place]) 06/08/2019  . Toe infection 09/26/2018  . Erythrocytosis 01/24/2018  . Sleep apnea 01/24/2018  . Hyperglycemia 02/25/2015  . Health care maintenance 08/23/2014  . Rash and nonspecific skin eruption 06/12/2014  . Thigh cramp 02/12/2014  . Pain of right clavicle 04/10/2013  . History of colon cancer 02/15/2013  .  Essential hypertension, benign 07/11/2012  . Pure hypercholesterolemia 07/11/2012  . Abnormal liver function 07/11/2012  . Leukopenia 07/11/2012    Past Surgical History:  Procedure Laterality Date  . ABDOMINAL HYSTERECTOMY  1984   secondary to bleeding and fibroid tumors  . APPENDECTOMY    . COLONOSCOPY W/ POLYPECTOMY  2001  . COLONOSCOPY WITH PROPOFOL N/A 07/28/2016   Procedure: COLONOSCOPY WITH PROPOFOL;  Surgeon: [name], MD;  Location: [facility] ENDOSCOPY;  Service: Endoscopy;  Laterality: N/A;    Prior to Admission medications   Medication Sig Start Date End Date Taking? Authorizing Provider  predniSONE (DELTASONE) 50 MG tablet 1 tab by mouth daily 07/29/20  Yes Delman Kitten, MD  amLODipine (NORVASC) 5 MG tablet TAKE 1 TABLET BY MOUTH EVERY DAY 03/14/20   [name], MD  aspirin EC 81 MG tablet Take 1 tablet (81 mg total) by mouth daily. 10/16/17   Earlie Server, MD  calcium carbonate (OS-CAL) 600 MG TABS Take 600 mg by mouth daily.     [provider]  nystatin cream (MYCOSTATIN) Apply 1 application topically 2 (two) times daily. 03/06/20   [name], MD  rosuvastatin (CRESTOR) 5 MG tablet TAKE 1 TABLET BY MOUTH EVERY DAY 03/19/20   Crecencio Mc, MD  vitamin E 400 UNIT capsule Take 400 Units by mouth daily.     [provider]  lisinopril (ZESTRIL) 30 MG tablet TAKE 1 TABLET BY MOUTH EVERY DAY 07/13/20 07/29/20  [name], MD    Allergies Lisinopril and Z-pak [azithromycin]  Family History  Problem Relation Age of Onset  . Stroke Mother   . Hypertension Mother   . Cancer Father        Prostate   . Heart disease Brother        myocardial infarction  . Hypertension Brother   . Hypertension Sister        x4  . Breast cancer Neg Hx     Social History Social History   Tobacco Use  . Smoking status: Former Smoker    Quit date: 1978    Years since quitting: 44.2  . Smokeless tobacco: Never Used  Vaping Use  . Vaping Use: Never used   Substance Use Topics  . Alcohol use: No    Alcohol/week: 0.0 standard drinks  . Drug use: No    Review of Systems Constitutional: No fever/chills Eyes: No visual changes except some swelling a little bit around her left eye. ENT: No sore throat.  No trouble breathing.  No tongue swelling. Cardiovascular: Denies chest pain. Respiratory: Denies shortness of breath. Gastrointestinal: No abdominal pain.   Skin: Negative for rash except the skin across her forehead seems a little bit irritated or a little weepy, and some slight swelling around the left eye. Neurological: Negative for headaches, areas of focal weakness or numbness.    ____________________________________________   PHYSICAL EXAM:  VITAL SIGNS: ED Triage Vitals  Enc Vitals Group     BP 07/29/20 0821 (!) 154/76     Pulse Rate 07/29/20 0821 85     Resp 07/29/20 0821 18     Temp 07/29/20 0821 98.5 F (36.9 C)     Temp Source 07/29/20 0821 Oral     SpO2 07/29/20 0821 97 %     Weight 07/29/20 0826 180 lb (81.6 kg)     Height 07/29/20 0826 5\' 3"  (1.6 m)     Head Circumference --      Peak Flow --      Pain Score 07/29/20 0827 0     Pain Loc --      Pain Edu? --      Excl. in [place]? --     Constitutional: Alert and oriented. Well appearing and in no acute distress. Eyes: Conjunctivae are normal. Head: Atraumatic. Nose: No congestion/rhinnorhea. Mouth/Throat: Mucous membranes are moist. Neck: No stridor.  No swelling around the neck.  Oropharynx is widely patent.  Normal tongue.  No swelling in the oropharynx or below the tongue.  No noted angioedema of the lips or face except slight periorbital edema around the left eye primarily around the left upper lid.  Still opens and closes the eye without difficulty. Cardiovascular: Normal rate, regular rhythm. Grossly normal heart sounds.  Good peripheral circulation. Respiratory: Normal respiratory effort.  No retractions. Lungs CTAB. Gastrointestinal: Soft and nontender.  No distention. Musculoskeletal: No lower extremity tenderness nor edema. Neurologic:  Normal speech and language. No gross focal neurologic deficits are appreciated.  Skin:  Skin is warm, dry and intact. No rash noted except across her forehead she seems to have a slight very minimally raised slightly papular but not erythematous type of eruption without discharge or bleeding.  The hairline does not seem to be affected.  She reports the area does itch. Psychiatric: Mood and affect are normal. Speech and behavior are normal.  ____________________________________________   LABS (all labs ordered are listed, but only abnormal results are displayed)  Labs Reviewed -  No data to display ____________________________________________  EKG   ____________________________________________  RADIOLOGY   ____________________________________________   PROCEDURES  Procedure(s) performed: None  Procedures  Critical Care performed: No  ____________________________________________   INITIAL IMPRESSION / ASSESSMENT AND PLAN / ED COURSE  Pertinent labs & imaging results that were available during my care of the patient were reviewed by me and considered in my medical decision making (see chart for details).   Patient presents for what appears to be some sort of an allergic reaction.  Possibly brought on by hair product she used versus skin product yesterday, but also has some very mild left periorbital edema and being on an ACE inhibitor I have counseled her to discontinue this.  Her blood pressure is slightly elevated but she will continue to take Norvasc, and she reports she can reach out to her primary care doctor tomorrow regarding medications and document where her blood pressures land with being off lisinopril  Though I suspect mostly this is some type of dermatitis contact type dermatitis brought on by skin care product, I am concerned with her use of lisinopril.  She received Benadryl  which helped with her itching and was placed on additional 4 days of prednisone as anti-inflammatory.  Follow-up with her primary care doctor and also discussed she may follow-up with an allergist or dermatologist, and she is agreeable with this plan as well.  She does not have anything to suggest acute angioedema of life-threatening concern.  She does not have evidence of Stevens-Johnson's, or life-threatening rash.  Seems to be primarily across the forehead and the skin is not broken.  Return precautions and treatment recommendations and follow-up discussed with the patient who is agreeable with the plan.       ____________________________________________   FINAL CLINICAL IMPRESSION(S) / ED DIAGNOSES  Final diagnoses:  Dermatitis  Facial swelling        Note:  This document was prepared using Dragon voice recognition software and may include unintentional dictation errors       Delman Kitten, MD 07/30/20 1313

## 2020-07-31 ENCOUNTER — Telehealth: Payer: Self-pay | Admitting: Internal Medicine

## 2020-07-31 NOTE — Telephone Encounter (Signed)
Patient called and is getting all different BP readings and would like to bring her bp machine into office to have it checked with ours.

## 2020-07-31 NOTE — Telephone Encounter (Signed)
LMTCB

## 2020-08-01 NOTE — Telephone Encounter (Signed)
Pt called in to let me know she is feeling better as far as the facial swelling. Still off of lisinopril. She is has been monitoring BP and has been getting readings ranging 120s-140s/70-80. She would like to come in and have her cuff checked. I have scheduled her for a fu appt to check BP on 4/11. Will call before then if BP starts to trend up.

## 2020-08-13 ENCOUNTER — Ambulatory Visit (INDEPENDENT_AMBULATORY_CARE_PROVIDER_SITE_OTHER): Payer: BC Managed Care – PPO | Admitting: Internal Medicine

## 2020-08-13 ENCOUNTER — Other Ambulatory Visit: Payer: Self-pay

## 2020-08-13 DIAGNOSIS — I10 Essential (primary) hypertension: Secondary | ICD-10-CM | POA: Diagnosis not present

## 2020-08-13 DIAGNOSIS — T7840XD Allergy, unspecified, subsequent encounter: Secondary | ICD-10-CM | POA: Diagnosis not present

## 2020-08-13 DIAGNOSIS — G4730 Sleep apnea, unspecified: Secondary | ICD-10-CM

## 2020-08-13 DIAGNOSIS — E7800 Pure hypercholesterolemia, unspecified: Secondary | ICD-10-CM | POA: Diagnosis not present

## 2020-08-13 NOTE — Progress Notes (Signed)
[name]: Susan Weeks, female   DOB: Jul 07, 1950, 70 y.o.   MRN: [id number]   Subjective:    [name]: Susan Weeks, female    DOB: December 09, 1950, 70 y.o.   MRN: [id number]  HPI This visit occurred during the SARS-CoV-2 public health emergency.  Safety protocols were in place, including screening questions prior to the visit, additional usage of staff PPE, and extensive cleaning of exam room while observing appropriate contact time as indicated for disinfecting solutions.  Patient here for work in appt.  Was seen in the ER 07/29/20 with skin itching (facial - across her hairline).  Also had swelling - periorbital edema - documented.  Her ACE inhibitor was stopped.  She also stopped her bath and body lotion.  She was given benadryl and placed on prednisone.  She reports she is doing better now.  No facial swelling.  No rash.  No lip or tongue swelling.  No chest pain, chest tightness or sob.  No increased cough or congestion.  No nausea or vomiting.  No abdominal pain.  Bowels moving.   Past Medical History:  Diagnosis Date  . Abnormal liver function   . Colon cancer ([place])    adenocarcinoma of the cecum s/p partial colon resection  . History of colon polyps   . Hyperlipidemia   . Hypertension    Past Surgical History:  Procedure Laterality Date  . ABDOMINAL HYSTERECTOMY  1984   secondary to bleeding and fibroid tumors  . APPENDECTOMY    . COLONOSCOPY W/ POLYPECTOMY  2001  . COLONOSCOPY WITH PROPOFOL N/A 07/28/2016   Procedure: COLONOSCOPY WITH PROPOFOL;  Surgeon: [name], MD;  Location: [facility] ENDOSCOPY;  Service: Endoscopy;  Laterality: N/A;   Family History  Problem Relation Age of Onset  . Stroke Mother   . Hypertension Mother   . Cancer Father        Prostate   . Heart disease Brother        myocardial infarction  . Hypertension Brother   . Hypertension Sister        x4  . Breast cancer Neg Hx    Social History   Socioeconomic History  . Marital status:  Married    Spouse name: [name]  . Number of children: 1  . Years of education: HS  . Highest education level: Not on file  Occupational History    Employer: OTHER    Comment: [employer]  Tobacco Use  . Smoking status: Former Smoker    Quit date: 1978    Years since quitting: 44.3  . Smokeless tobacco: Never Used  Vaping Use  . Vaping Use: Never used  Substance and Sexual Activity  . Alcohol use: No    Alcohol/week: 0.0 standard drinks  . Drug use: No  . Sexual activity: Not on file  Other Topics Concern  . Not on file  Social History Narrative  . Not on file   Social Determinants of Health   Financial Resource Strain: Not on file  Food Insecurity: Not on file  Transportation Needs: Not on file  Physical Activity: Not on file  Stress: Not on file  Social Connections: Not on file    Outpatient Encounter Medications as of 08/13/2020  Medication Sig  . amLODipine (NORVASC) 5 MG tablet TAKE 1 TABLET BY MOUTH EVERY DAY  . aspirin EC 81 MG tablet Take 1 tablet (81 mg total) by mouth daily.  . calcium carbonate (OS-CAL) 600 MG TABS Take 600 mg  by mouth daily.   [name] nystatin cream (MYCOSTATIN) Apply 1 application topically 2 (two) times daily.  . predniSONE (DELTASONE) 50 MG tablet 1 tab by mouth daily  . rosuvastatin (CRESTOR) 5 MG tablet TAKE 1 TABLET BY MOUTH EVERY DAY  . vitamin E 400 UNIT capsule Take 400 Units by mouth daily.   . [DISCONTINUED] lisinopril (ZESTRIL) 30 MG tablet TAKE 1 TABLET BY MOUTH EVERY DAY   No facility-administered encounter medications on file as of 08/13/2020.    Review of Systems  Constitutional: Negative for appetite change and unexpected weight change.  HENT: Negative for congestion and sinus pressure.   Respiratory: Negative for cough, chest tightness and shortness of breath.   Cardiovascular: Negative for chest pain, palpitations and leg swelling.  Gastrointestinal: Negative for abdominal pain, diarrhea, nausea and vomiting.   Genitourinary: Negative for difficulty urinating and dysuria.  Musculoskeletal: Negative for joint swelling and myalgias.  Skin: Negative for color change and rash.  Neurological: Negative for dizziness, light-headedness and headaches.  Psychiatric/Behavioral: Negative for agitation and dysphoric mood.       Objective:    Physical Exam Vitals reviewed.  Constitutional:      General: She is not in acute distress. HENT:     Head: Normocephalic and atraumatic.     Right Ear: External ear normal.     Left Ear: External ear normal.  Eyes:     General: No scleral icterus.       Right eye: No discharge.        Left eye: No discharge.     Conjunctiva/sclera: Conjunctivae normal.  Neck:     Thyroid: No thyromegaly.  Cardiovascular:     Rate and Rhythm: Normal rate and regular rhythm.  Pulmonary:     Effort: No respiratory distress.     Breath sounds: Normal breath sounds. No wheezing.  Abdominal:     General: Bowel sounds are normal.     Palpations: Abdomen is soft.     Tenderness: There is no abdominal tenderness.  Musculoskeletal:        General: No swelling or tenderness.     Cervical back: Neck supple. No tenderness.  Lymphadenopathy:     Cervical: No cervical adenopathy.  Skin:    Findings: No erythema or rash.  Neurological:     Mental Status: She is alert.  Psychiatric:        Mood and Affect: Mood normal.        Behavior: Behavior normal.     BP 128/72   Pulse 83   Temp 98 F (36.7 C) (Oral)   Resp 16   Ht 5\' 3"  (1.6 m)   Wt 181 lb (82.1 kg)   LMP 06/28/1982   SpO2 98%   BMI 32.06 kg/m  Wt Readings from Last 3 Encounters:  08/13/20 181 lb (82.1 kg)  07/29/20 180 lb (81.6 kg)  07/04/20 182 lb 9.6 oz (82.8 kg)     Lab Results  Component Value Date   WBC 3.8 (L) 05/02/2020   HGB 15.9 (H) 05/02/2020   HCT 47.5 (H) 05/02/2020   PLT 217 05/02/2020   GLUCOSE 96 07/25/2020   CHOL 147 07/25/2020   TRIG 71.0 07/25/2020   HDL 56.60 07/25/2020    LDLDIRECT 150.8 03/02/2013   LDLCALC 77 07/25/2020   ALT 31 07/25/2020   AST 20 07/25/2020   NA 137 07/25/2020   K 4.2 07/25/2020   CL 101 07/25/2020   CREATININE 0.87 07/25/2020   BUN 16  07/25/2020   CO2 27 07/25/2020   TSH 1.53 07/25/2020   INR 1.0 05/02/2020   HGBA1C 5.9 07/25/2020       Assessment & Plan:   Problem List Items Addressed This Visit    Allergic reaction    Allergic reaction as outlined.  Off bath and body lotion and off lisinopril.  Completed prednisone taper.  Doing well.  Follow.  No residual problems.        Essential hypertension, benign    Is off lisinopril.  Taking amlodipine.  Blood pressure currently wnl.  Continue amlodipine.  She will monitor her pressure.  Follow and send in readings.  Follow metabolic panel.       Pure hypercholesterolemia    Continues on crestor.  Low cholesterol diet and exercise.  Follow lipid panel and liver function tests.        Sleep apnea    Continue cpap.           [name], MD

## 2020-08-14 ENCOUNTER — Ambulatory Visit: Payer: BC Managed Care – PPO | Admitting: Internal Medicine

## 2020-08-20 ENCOUNTER — Encounter: Payer: Self-pay | Admitting: Internal Medicine

## 2020-08-20 DIAGNOSIS — T7840XA Allergy, unspecified, initial encounter: Secondary | ICD-10-CM | POA: Insufficient documentation

## 2020-08-20 NOTE — Assessment & Plan Note (Signed)
Continues on crestor.  Low cholesterol diet and exercise.  Follow lipid panel and liver function tests.

## 2020-08-20 NOTE — Assessment & Plan Note (Signed)
Allergic reaction as outlined.  Off bath and body lotion and off lisinopril.  Completed prednisone taper.  Doing well.  Follow.  No residual problems.

## 2020-08-20 NOTE — Assessment & Plan Note (Signed)
Is off lisinopril.  Taking amlodipine.  Blood pressure currently wnl.  Continue amlodipine.  She will monitor her pressure.  Follow and send in readings.  Follow metabolic panel.

## 2020-08-20 NOTE — Assessment & Plan Note (Signed)
Continue cpap.  

## 2020-09-11 ENCOUNTER — Other Ambulatory Visit: Payer: Self-pay

## 2020-09-11 ENCOUNTER — Encounter: Payer: Self-pay | Admitting: Internal Medicine

## 2020-09-11 ENCOUNTER — Ambulatory Visit (INDEPENDENT_AMBULATORY_CARE_PROVIDER_SITE_OTHER): Payer: BC Managed Care – PPO | Admitting: Internal Medicine

## 2020-09-11 VITALS — BP 120/72 | HR 85 | Temp 97.0°F | Resp 16 | Ht 63.0 in | Wt 184.0 lb

## 2020-09-11 DIAGNOSIS — I10 Essential (primary) hypertension: Secondary | ICD-10-CM

## 2020-09-11 DIAGNOSIS — E7800 Pure hypercholesterolemia, unspecified: Secondary | ICD-10-CM

## 2020-09-11 DIAGNOSIS — Z1231 Encounter for screening mammogram for malignant neoplasm of breast: Secondary | ICD-10-CM

## 2020-09-11 DIAGNOSIS — G4730 Sleep apnea, unspecified: Secondary | ICD-10-CM

## 2020-09-11 DIAGNOSIS — R945 Abnormal results of liver function studies: Secondary | ICD-10-CM

## 2020-09-11 DIAGNOSIS — R739 Hyperglycemia, unspecified: Secondary | ICD-10-CM

## 2020-09-11 DIAGNOSIS — D582 Other hemoglobinopathies: Secondary | ICD-10-CM

## 2020-09-11 DIAGNOSIS — Z85038 Personal history of other malignant neoplasm of large intestine: Secondary | ICD-10-CM

## 2020-09-11 NOTE — Progress Notes (Signed)
[name]: Susan Weeks, female   DOB: 07-24-1950, 70 y.o.   MRN: [id number]   Subjective:    [name]: Susan Weeks, female    DOB: 05-Jun-1950, 70 y.o.   MRN: [id number]  HPI This visit occurred during the SARS-CoV-2 public health emergency.  Safety protocols were in place, including screening questions prior to the visit, additional usage of staff PPE, and extensive cleaning of exam room while observing appropriate contact time as indicated for disinfecting solutions.  Patient here for a scheduled follow up.  Here to follow up regarding her blood pressure.  Had allergic reaction to lisinopril - rash and swelling.   Off now.  Doing well off the medication.  No headache.  No dizziness.  No chest pain or sob.  No acid reflux. No abdominal pain.  Bowels moving.  Due to f/u with Dr Tasia Catchings in 10/2020.   Past Medical History:  Diagnosis Date  . Abnormal liver function   . Colon cancer ([place])    adenocarcinoma of the cecum s/p partial colon resection  . History of colon polyps   . Hyperlipidemia   . Hypertension    Past Surgical History:  Procedure Laterality Date  . ABDOMINAL HYSTERECTOMY  1984   secondary to bleeding and fibroid tumors  . APPENDECTOMY    . COLONOSCOPY W/ POLYPECTOMY  2001  . COLONOSCOPY WITH PROPOFOL N/A 07/28/2016   Procedure: COLONOSCOPY WITH PROPOFOL;  Surgeon: [name], MD;  Location: [facility] ENDOSCOPY;  Service: Endoscopy;  Laterality: N/A;   Family History  Problem Relation Age of Onset  . Stroke Mother   . Hypertension Mother   . Cancer Father        Prostate   . Heart disease Brother        myocardial infarction  . Hypertension Brother   . Hypertension Sister        x4  . Breast cancer Neg Hx    Social History   Socioeconomic History  . Marital status: Married    Spouse name: [name]  . Number of children: 1  . Years of education: HS  . Highest education level: Not on file  Occupational History    Employer: OTHER    Comment: [employer]   Tobacco Use  . Smoking status: Former Smoker    Quit date: 1978    Years since quitting: 44.4  . Smokeless tobacco: Never Used  Vaping Use  . Vaping Use: Never used  Substance and Sexual Activity  . Alcohol use: No    Alcohol/week: 0.0 standard drinks  . Drug use: No  . Sexual activity: Not on file  Other Topics Concern  . Not on file  Social History Narrative  . Not on file   Social Determinants of Health   Financial Resource Strain: Not on file  Food Insecurity: Not on file  Transportation Needs: Not on file  Physical Activity: Not on file  Stress: Not on file  Social Connections: Not on file    Outpatient Encounter Medications as of 09/11/2020  Medication Sig  . amLODipine (NORVASC) 5 MG tablet TAKE 1 TABLET BY MOUTH EVERY DAY  . aspirin EC 81 MG tablet Take 1 tablet (81 mg total) by mouth daily.  . calcium carbonate (OS-CAL) 600 MG TABS Take 600 mg by mouth daily.   . rosuvastatin (CRESTOR) 5 MG tablet TAKE 1 TABLET BY MOUTH EVERY DAY  . vitamin E 400 UNIT capsule Take 400 Units by mouth daily.   . [  DISCONTINUED] lisinopril (ZESTRIL) 30 MG tablet TAKE 1 TABLET BY MOUTH EVERY DAY  . [DISCONTINUED] nystatin cream (MYCOSTATIN) Apply 1 application topically 2 (two) times daily. (Patient not taking: Reported on 09/11/2020)  . [DISCONTINUED] predniSONE (DELTASONE) 50 MG tablet 1 tab by mouth daily (Patient not taking: Reported on 09/11/2020)   No facility-administered encounter medications on file as of 09/11/2020.    Review of Systems  Constitutional: Negative for appetite change and unexpected weight change.  HENT: Negative for congestion and sinus pressure.   Respiratory: Negative for cough, chest tightness and shortness of breath.   Cardiovascular: Negative for chest pain, palpitations and leg swelling.  Gastrointestinal: Negative for abdominal pain, diarrhea, nausea and vomiting.  Genitourinary: Negative for difficulty urinating and dysuria.  Musculoskeletal: Negative  for joint swelling and myalgias.  Skin: Negative for color change and rash.  Neurological: Negative for dizziness, light-headedness and headaches.  Psychiatric/Behavioral: Negative for agitation and dysphoric mood.       Objective:    Physical Exam Vitals reviewed.  Constitutional:      General: She is not in acute distress.    Appearance: Normal appearance.  HENT:     Head: Normocephalic and atraumatic.     Right Ear: External ear normal.     Left Ear: External ear normal.  Eyes:     General: No scleral icterus.       Right eye: No discharge.        Left eye: No discharge.     Conjunctiva/sclera: Conjunctivae normal.  Neck:     Thyroid: No thyromegaly.  Cardiovascular:     Rate and Rhythm: Normal rate and regular rhythm.  Pulmonary:     Effort: No respiratory distress.     Breath sounds: Normal breath sounds. No wheezing.  Abdominal:     General: Bowel sounds are normal.     Palpations: Abdomen is soft.     Tenderness: There is no abdominal tenderness.  Musculoskeletal:        General: No swelling or tenderness.     Cervical back: Neck supple. No tenderness.  Lymphadenopathy:     Cervical: No cervical adenopathy.  Skin:    Findings: No erythema or rash.  Neurological:     Mental Status: She is alert.  Psychiatric:        Mood and Affect: Mood normal.        Behavior: Behavior normal.     BP 120/72   Pulse 85   Temp (!) 97 F (36.1 C) (Temporal)   Resp 16   Ht 5' 3"  (1.6 m)   Wt 184 lb (83.5 kg)   LMP 06/28/1982   SpO2 99%   BMI 32.59 kg/m  Wt Readings from Last 3 Encounters:  09/11/20 184 lb (83.5 kg)  08/13/20 181 lb (82.1 kg)  07/29/20 180 lb (81.6 kg)     Lab Results  Component Value Date   WBC 3.8 (L) 05/02/2020   HGB 15.9 (H) 05/02/2020   HCT 47.5 (H) 05/02/2020   PLT 217 05/02/2020   GLUCOSE 96 07/25/2020   CHOL 147 07/25/2020   TRIG 71.0 07/25/2020   HDL 56.60 07/25/2020   LDLDIRECT 150.8 03/02/2013   LDLCALC 77 07/25/2020   ALT  31 07/25/2020   AST 20 07/25/2020   NA 137 07/25/2020   K 4.2 07/25/2020   CL 101 07/25/2020   CREATININE 0.87 07/25/2020   BUN 16 07/25/2020   CO2 27 07/25/2020   TSH 1.53 07/25/2020   INR  1.0 05/02/2020   HGBA1C 5.9 07/25/2020       Assessment & Plan:   Problem List Items Addressed This Visit    Abnormal liver function    Had f/u liver lesion as outlined previous note.  Recommended f/u MRI in 6 months (last scan 06/2020).  Followed by hematology.  Follow liver function tests.        Elevated hemoglobin ([place])    Followed by hematology.       Essential hypertension, benign    Previously on lisinopril.  Has swelling as outlined. Off now.  Continues on amlodipine.  Blood pressure doing well.  Hold on additional medication.  Follow pressures.       History of colon cancer    Colonoscopy 10/2019 - recommended f/u in 3 years.       Hyperglycemia    Low carb diet and exercise.  Follow met b and a1c.       Pure hypercholesterolemia    Continue crestor.  Low cholesterol diet and exercise.  Follow lipid panel and liver function tests.       Sleep apnea    Continue cpap.        Other Visit Diagnoses    Visit for screening mammogram    -  Primary   Relevant Orders   MM 3D SCREEN BREAST BILATERAL       [name], MD

## 2020-09-17 ENCOUNTER — Encounter: Payer: Self-pay | Admitting: Internal Medicine

## 2020-09-17 NOTE — Assessment & Plan Note (Signed)
Followed by hematology 

## 2020-09-17 NOTE — Assessment & Plan Note (Signed)
Continue crestor.  Low cholesterol diet and exercise. Follow lipid panel and liver function tests.   

## 2020-09-17 NOTE — Assessment & Plan Note (Signed)
Colonoscopy 10/2019 - recommended f/u in 3 years.  

## 2020-09-17 NOTE — Assessment & Plan Note (Signed)
Had f/u liver lesion as outlined previous note.  Recommended f/u MRI in 6 months (last scan 06/2020).  Followed by hematology.  Follow liver function tests.

## 2020-09-17 NOTE — Assessment & Plan Note (Signed)
Previously on lisinopril.  Has swelling as outlined. Off now.  Continues on amlodipine.  Blood pressure doing well.  Hold on additional medication.  Follow pressures.

## 2020-09-17 NOTE — Assessment & Plan Note (Signed)
Continue cpap.  

## 2020-09-17 NOTE — Assessment & Plan Note (Signed)
Low carb diet and exercise.  Follow met b and a1c.  

## 2020-10-07 ENCOUNTER — Other Ambulatory Visit: Payer: Self-pay | Admitting: Internal Medicine

## 2020-10-22 ENCOUNTER — Encounter: Payer: Self-pay | Admitting: Internal Medicine

## 2020-10-31 ENCOUNTER — Other Ambulatory Visit: Payer: Self-pay

## 2020-10-31 DIAGNOSIS — D751 Secondary polycythemia: Secondary | ICD-10-CM

## 2020-11-01 ENCOUNTER — Inpatient Hospital Stay: Payer: BC Managed Care – PPO | Admitting: Oncology

## 2020-11-01 ENCOUNTER — Inpatient Hospital Stay: Payer: BC Managed Care – PPO

## 2020-11-06 ENCOUNTER — Other Ambulatory Visit: Payer: BC Managed Care – PPO

## 2020-11-06 ENCOUNTER — Other Ambulatory Visit: Payer: Self-pay | Admitting: *Deleted

## 2020-11-06 DIAGNOSIS — I10 Essential (primary) hypertension: Secondary | ICD-10-CM

## 2020-11-06 DIAGNOSIS — R739 Hyperglycemia, unspecified: Secondary | ICD-10-CM

## 2020-11-07 ENCOUNTER — Other Ambulatory Visit (INDEPENDENT_AMBULATORY_CARE_PROVIDER_SITE_OTHER): Payer: BC Managed Care – PPO

## 2020-11-07 ENCOUNTER — Other Ambulatory Visit: Payer: Self-pay

## 2020-11-07 DIAGNOSIS — I10 Essential (primary) hypertension: Secondary | ICD-10-CM

## 2020-11-07 DIAGNOSIS — R739 Hyperglycemia, unspecified: Secondary | ICD-10-CM | POA: Diagnosis not present

## 2020-11-07 LAB — BASIC METABOLIC PANEL
BUN: 15 mg/dL (ref 6–23)
CO2: 28 mEq/L (ref 19–32)
Calcium: 9.1 mg/dL (ref 8.4–10.5)
Chloride: 102 mEq/L (ref 96–112)
Creatinine, Ser: 0.88 mg/dL (ref 0.40–1.20)
GFR: 66.32 mL/min (ref >60.00)
Glucose, Bld: 90 mg/dL (ref 70–99)
Potassium: 3.9 mEq/L (ref 3.5–5.1)
Sodium: 137 mEq/L (ref 135–145)

## 2020-11-07 LAB — HEMOGLOBIN A1C: Hgb A1c MFr Bld: 5.9 % (ref 4.6–6.5)

## 2020-11-08 ENCOUNTER — Encounter: Payer: BC Managed Care – PPO | Admitting: Internal Medicine

## 2020-11-30 ENCOUNTER — Other Ambulatory Visit: Payer: Self-pay

## 2020-11-30 ENCOUNTER — Inpatient Hospital Stay: Payer: BC Managed Care – PPO | Attending: Oncology

## 2020-11-30 ENCOUNTER — Inpatient Hospital Stay (HOSPITAL_BASED_OUTPATIENT_CLINIC_OR_DEPARTMENT_OTHER): Payer: BC Managed Care – PPO | Admitting: Oncology

## 2020-11-30 ENCOUNTER — Encounter: Payer: Self-pay | Admitting: Oncology

## 2020-11-30 ENCOUNTER — Other Ambulatory Visit: Payer: Self-pay | Admitting: Oncology

## 2020-11-30 VITALS — BP 153/77 | HR 79 | Temp 97.5°F | Resp 18 | Wt 185.4 lb

## 2020-11-30 DIAGNOSIS — G4730 Sleep apnea, unspecified: Secondary | ICD-10-CM | POA: Diagnosis not present

## 2020-11-30 DIAGNOSIS — E785 Hyperlipidemia, unspecified: Secondary | ICD-10-CM | POA: Insufficient documentation

## 2020-11-30 DIAGNOSIS — Z85038 Personal history of other malignant neoplasm of large intestine: Secondary | ICD-10-CM

## 2020-11-30 DIAGNOSIS — K769 Liver disease, unspecified: Secondary | ICD-10-CM | POA: Diagnosis not present

## 2020-11-30 DIAGNOSIS — D751 Secondary polycythemia: Secondary | ICD-10-CM

## 2020-11-30 DIAGNOSIS — R160 Hepatomegaly, not elsewhere classified: Secondary | ICD-10-CM

## 2020-11-30 DIAGNOSIS — Z7982 Long term (current) use of aspirin: Secondary | ICD-10-CM | POA: Diagnosis not present

## 2020-11-30 DIAGNOSIS — I10 Essential (primary) hypertension: Secondary | ICD-10-CM | POA: Insufficient documentation

## 2020-11-30 DIAGNOSIS — Z79899 Other long term (current) drug therapy: Secondary | ICD-10-CM | POA: Diagnosis not present

## 2020-11-30 DIAGNOSIS — R970 Elevated carcinoembryonic antigen [CEA]: Secondary | ICD-10-CM | POA: Insufficient documentation

## 2020-11-30 LAB — BASIC METABOLIC PANEL
Anion gap: 7 (ref 5–15)
BUN: 13 mg/dL (ref 8–23)
CO2: 29 mmol/L (ref 22–32)
Calcium: 9.2 mg/dL (ref 8.9–10.3)
Chloride: 103 mmol/L (ref 98–111)
Creatinine, Ser: 0.82 mg/dL (ref 0.44–1.00)
GFR, Estimated: >60 mL/min (ref >60)
Glucose, Bld: 108 mg/dL — ABNORMAL HIGH (ref 70–99)
Potassium: 3.9 mmol/L (ref 3.5–5.1)
Sodium: 139 mmol/L (ref 135–145)

## 2020-11-30 LAB — CBC WITH DIFFERENTIAL/PLATELET
Abs Immature Granulocytes: 0.01 10*3/uL (ref 0.00–0.07)
Basophils Absolute: 0 10*3/uL (ref 0.0–0.1)
Basophils Relative: 1 %
Eosinophils Absolute: 0.1 10*3/uL (ref 0.0–0.5)
Eosinophils Relative: 3 %
HCT: 46 % (ref 36.0–46.0)
Hemoglobin: 15.5 g/dL — ABNORMAL HIGH (ref 12.0–15.0)
Immature Granulocytes: 0 %
Lymphocytes Relative: 44 %
Lymphs Abs: 1.7 10*3/uL (ref 0.7–4.0)
MCH: 29.3 pg (ref 26.0–34.0)
MCHC: 33.7 g/dL (ref 30.0–36.0)
MCV: 87 fL (ref 80.0–100.0)
Monocytes Absolute: 0.3 10*3/uL (ref 0.1–1.0)
Monocytes Relative: 8 %
Neutro Abs: 1.8 10*3/uL (ref 1.7–7.7)
Neutrophils Relative %: 44 %
Platelets: 209 10*3/uL (ref 150–400)
RBC: 5.29 MIL/uL — ABNORMAL HIGH (ref 3.87–5.11)
RDW: 12.8 % (ref 11.5–15.5)
WBC: 4 10*3/uL (ref 4.0–10.5)
nRBC: 0 % (ref 0.0–0.2)

## 2020-11-30 LAB — LIPID PANEL
Cholesterol: 147 mg/dL (ref 0–200)
HDL: 61 mg/dL (ref >40)
LDL Cholesterol: 70 mg/dL (ref 0–99)
Total CHOL/HDL Ratio: 2.4 RATIO
Triglycerides: 79 mg/dL (ref <150)
VLDL: 16 mg/dL (ref 0–40)

## 2020-11-30 LAB — TSH: TSH: 1.127 u[IU]/mL (ref 0.350–4.500)

## 2020-11-30 LAB — HEPATIC FUNCTION PANEL
ALT: 34 U/L (ref 0–44)
AST: 28 U/L (ref 15–41)
Albumin: 4.3 g/dL (ref 3.5–5.0)
Alkaline Phosphatase: 59 U/L (ref 38–126)
Bilirubin, Direct: 0.1 mg/dL (ref 0.0–0.2)
Indirect Bilirubin: 0.7 mg/dL (ref 0.3–0.9)
Total Bilirubin: 0.8 mg/dL (ref 0.3–1.2)
Total Protein: 7.1 g/dL (ref 6.5–8.1)

## 2020-11-30 NOTE — Progress Notes (Signed)
Patient here for follow up. No new concerns voiced.  °

## 2020-12-01 LAB — CEA: CEA: 4 ng/mL (ref 0.0–4.7)

## 2020-12-01 NOTE — Progress Notes (Signed)
Hematology/Oncology follow up note [facility] [phone] [phone]   Patient Care Team: [name], MD as PCP - General (Internal Medicine)  REFERRING PROVIDER: [name], MD  REASON FOR VISIT Follow up for treatment of erythrocytosis, new liver lesion and adnexal lesion  HISTORY OF PRESENTING ILLNESS:  Susan Weeks is a  70 y.o.  female with PMH listed below who was referred to me for evaluation of elevated hemoglobin.  Recent hemoglobin 16.2, hct 47.7, reviewed her previous labs, hemoglobin has been chronically high since 2017, ranging from 15.1 to 16.2.   Pertinent Oncology Histroy  Previous history of carcinoma of colon status post resection, has an intermittent rising CEA.  Status post resection, no evidence of malignancy.-2011 Patient had exploratory laparotomy because of abnormal PET scan following rising CEA and was negative for any malignancy. CEA  remains stable in the range of 5.0   #Erythrocytosis was thought to be secondary, negative for Jak 2 mutation with reflex, BCR ABL mutation negative. # Sleep Apnea, patient establish care with Dr. Hector Shade.   on CPAP machine.  # 03/19/2020, ultrasound abdomen complete showed indeterminate lesions in the dome of liver.  Recommend MRI. 04/04/2020, patient had MRI abdomen with and without contrast Focal hepatic lesions [right dome2.3 x 1.9 cm, left lateral segment ] and in the right and left hepatic lobes, suspicious for metastatic disease. There is also a potential solid right adnexal lesion measuring 2.6 x 2.4 cm.  Potential ovarian neoplasm.  INTERVAL HISTORY Susan Weeks is a 70 y.o. female who has above history reviewed by me today presents for follow-up visit for management of erythrocytosis, history of colon cancer. 04/15/2020 US pelvis showed  Questionable visualization of a tiny RIGHT ovary containing 6 mm and 5 mm cysts; no follow-up imaging  recommended. Questionable 3.4 cm diameter shadowing mass in RIGHT pelvis versus artifact related to stool within the colon; recommend dedicated CT imaging of the pelvis with IV contrast to assess.  04/24/2020 PET scan showed the liver lesions are not hypermetabolic.    [id number] Liver lesion was further evaluated by contrast enhanced [place] and Hypoechoic mass in the dome of the right lobe of the liver measuring up to 3.9 cm which demonstrates very early, flash filling diffuse enhancement without evidence of washout. These findings are most compatible with a flash filling hemangioma. Biopsy was not proceeded.   Today she reports feeling well. No new complaints.   Review of Systems  Constitutional:  Negative for chills, fever, malaise/fatigue and weight loss.  HENT:  Negative for sore throat.   Eyes:  Negative for redness.  Respiratory:  Negative for cough, shortness of breath and wheezing.   Cardiovascular:  Negative for chest pain, palpitations and leg swelling.  Gastrointestinal:  Negative for abdominal pain, blood in stool, nausea and vomiting.  Genitourinary:  Negative for dysuria.  Musculoskeletal:  Negative for myalgias.  Skin:  Negative for rash.  Neurological:  Negative for dizziness, tingling and tremors.  Endo/Heme/Allergies:  Does not bruise/bleed easily.  Psychiatric/Behavioral:  Negative for hallucinations.    MEDICAL HISTORY:  Past Medical History:  Diagnosis Date   Abnormal liver function    Colon cancer ([place])    adenocarcinoma of the cecum s/p partial colon resection   History of colon polyps    Hyperlipidemia    Hypertension     SURGICAL HISTORY: Past Surgical History:  Procedure Laterality Date   ABDOMINAL HYSTERECTOMY  1984   secondary to bleeding and fibroid tumors  APPENDECTOMY     COLONOSCOPY W/ POLYPECTOMY  2001   COLONOSCOPY WITH PROPOFOL N/A 07/28/2016   Procedure: COLONOSCOPY WITH PROPOFOL;  Surgeon: [name], MD;  [facility] END[facility]COPY;   Service: Endoscopy;  Laterality: N/A;    SOCIAL HISTORY: Social History   Socioeconomic History   Marital status: Married    Spouse name: Eddie   Number of children: 1   Years of education: HS   Highest education level: Not on file  Occupational History    Employer: OTHER    Comment: [employer]  Tobacco Use   Smoking status: Former    Types: Cigarettes    Quit date: 1978    Years since quitting: 44.6   Smokeless tobacco: Never  Vaping Use   Vaping Use: Never used  Substance and Sexual Activity   Alcohol use: No    Alcohol/week: 0.0 standard drinks   Drug use: No   Sexual activity: Not on file  Other Topics Concern   Not on file  Social History Narrative   Not on file   Social Determinants of Health   Financial Resource Strain: Not on file  Food Insecurity: Not on file  Transportation Needs: Not on file  Physical Activity: Not on file  Stress: Not on file  Social Connections: Not on file  Intimate Partner Violence: Not on file    FAMILY HISTORY: Family History  Problem Relation Age of Onset   Stroke Mother    Hypertension Mother    Cancer Father        Prostate    Heart disease Brother        myocardial infarction   Hypertension Brother    Hypertension Sister        x4   Breast cancer Neg Hx     ALLERGIES:  is allergic to lisinopril and z-pak [azithromycin].  MEDICATIONS:  Current Outpatient Medications  Medication Sig Dispense Refill   amLODipine (NORVASC) 5 MG tablet TAKE 1 TABLET BY MOUTH EVERY DAY 90 tablet 1   aspirin EC 81 MG tablet Take 1 tablet (81 mg total) by mouth daily. 90 tablet 3   calcium carbonate (OS-CAL) 600 MG TABS Take 600 mg by mouth daily.      rosuvastatin (CRESTOR) 5 MG tablet TAKE 1 TABLET BY MOUTH EVERY DAY 90 tablet 3   vitamin E 400 UNIT capsule Take 400 Units by mouth daily.      No current facility-administered medications for this visit.     PHYSICAL EXAMINATION: ECOG PERFORMANCE STATUS: 0 -  Asymptomatic Vitals:   11/30/20 1204  BP: (!) 153/77  Pulse: 79  Resp: 18  Temp: (!) 97.5 F (36.4 C)   Filed Weights   11/30/20 1204  Weight: 185 lb 6.4 oz (84.1 kg)    Physical Exam Constitutional:      General: She is not in acute distress. HENT:     Head: Normocephalic and atraumatic.  Eyes:     General: No scleral icterus. Cardiovascular:     Rate and Rhythm: Normal rate and regular rhythm.     Heart sounds: Normal heart sounds.  Pulmonary:     Effort: Pulmonary effort is normal. No respiratory distress.     Breath sounds: No wheezing.  Abdominal:     General: Bowel sounds are normal. There is no distension.     Palpations: Abdomen is soft.  Musculoskeletal:        General: No deformity. Normal range of motion.  Cervical back: Normal range of motion and neck supple.  Skin:    General: Skin is warm and dry.     Findings: No erythema or rash.  Neurological:     Mental Status: She is alert and oriented to person, place, and time. Mental status is at baseline.     Cranial Nerves: No cranial nerve deficit.     Coordination: Coordination normal.  Psychiatric:        Mood and Affect: Mood normal.     LABORATORY DATA:  I have reviewed the data as listed Lab Results  Component Value Date   WBC 4.0 11/30/2020   HGB 15.5 (H) 11/30/2020   HCT 46.0 11/30/2020   MCV 87.0 11/30/2020   PLT 209 11/30/2020   Recent Labs    03/06/20 0911 04/11/20 1315 07/25/20 0903 11/07/20 0957 11/30/20 1145  NA 139 136 137 137 139  K 4.0 4.3 4.2 3.9 3.9  CL 102 101 101 102 103  CO2 '29 23 27 28 29  '$ GLUCOSE 91 113* 96 90 108*  BUN '18 18 16 15 13  '$ CREATININE 0.92 0.90 0.87 0.88 0.82  CALCIUM 9.6 9.3 8.8 9.1 9.2  GFRNONAA  --  >60  --   --  >60  PROT 7.4 7.4 6.9  --  7.1  ALBUMIN 4.6 4.5 4.5  --  4.3  AST '27 22 20  '$ --  28  ALT 39* 28 31  --  34  ALKPHOS 60 44 63  --  59  BILITOT 1.0 1.3* 0.7  --  0.8  BILIDIR 0.2  --  0.2  --  0.1  IBILI  --   --   --   --  0.7         ASSESSMENT & PLAN:  1. Erythrocytosis   2. History of colon cancer   3. Liver lesion    # liver lesions she has had extensive work up No hypermetabolism on PET.  Contrasted enhanced US showed likely benign hemangioma and biopsy was not proceeded.  Recommend to repeat another contrast enhanced [place] for follow up within 6- 12 months. Will obtain.  CEA remains stable.   # Right adnexal lesion, has had negative work up so far. #Secondary erythrocytosis, labs are reviewed.  Hemoglobin is stable at 15.5, HCT 46.  Continue CPAP machine. No need for phlebotomuy   Orders Placed This Encounter  Procedures   US LIVER W/CM 1ST LESION    To be done by Dr. Serafina Royals    Standing Status:   Future    Standing Expiration Date:   11/30/2021    Order Specific Question:   Reason for Exam (SYMPTOM  OR DIAGNOSIS REQUIRED)    Answer:   liver lesion, history of colon cancer    Order Specific Question:   Preferred imaging location?    Answer:   [facility]   CBC with Differential/Platelet    Standing Status:   Future    Number of Occurrences:   1    Standing Expiration Date:   11/30/2021   CEA    Standing Status:   Future    Number of Occurrences:   1    Standing Expiration Date:   11/30/2021   Follow up Labs in 6 months (cbc,cmp,cea)  MD 1-2 days after labs  Earlie Server, MD, PhD Hematology [place] [facility] Pager- [phone] 12/01/2020

## 2020-12-03 ENCOUNTER — Telehealth: Payer: Self-pay

## 2020-12-03 NOTE — Telephone Encounter (Signed)
pt scheduled for US Liver on 8/12 at 9:30 with arrival time 8:30am. Pt informed of appt details.

## 2020-12-10 NOTE — Patient Outreach (Signed)
Patient on schedule for Liver biopsy 8/12, called and spoke with patient on phone with pre procedure instructions given. Made aware to be here @ 0830, NPO after MN prior to procedure and driver post procedure/recovery/discharge. Stated understanding.holding 81 mg ASA, LD 8/8.

## 2020-12-14 ENCOUNTER — Other Ambulatory Visit: Payer: Self-pay

## 2020-12-14 ENCOUNTER — Ambulatory Visit
Admission: RE | Admit: 2020-12-14 | Discharge: 2020-12-14 | Disposition: A | Discharge status: Home / Self Care | Payer: BC Managed Care – PPO | Source: Ambulatory Visit | Attending: Oncology | Admitting: Oncology

## 2020-12-14 DIAGNOSIS — D751 Secondary polycythemia: Secondary | ICD-10-CM | POA: Diagnosis present

## 2020-12-14 DIAGNOSIS — R160 Hepatomegaly, not elsewhere classified: Secondary | ICD-10-CM | POA: Insufficient documentation

## 2020-12-14 MED ORDER — SULFUR HEXAFLUORIDE MICROSPH 60.7-25 MG IJ SUSR
5.0000 mL | Freq: Once | INTRAMUSCULAR | Status: AC | PRN
  Administered 2020-12-14: 5 mL via INTRAVENOUS

## 2020-12-14 NOTE — Progress Notes (Signed)
Patient here today for Liver Contrast study per Dr Serafina Royals, tolerated well. Post procedure. Patient without complaints.

## 2020-12-19 ENCOUNTER — Ambulatory Visit
Admission: RE | Admit: 2020-12-19 | Discharge: 2020-12-19 | Disposition: A | Discharge status: Home / Self Care | Payer: BC Managed Care – PPO | Source: Ambulatory Visit | Attending: Internal Medicine | Admitting: Internal Medicine

## 2020-12-19 ENCOUNTER — Other Ambulatory Visit: Payer: Self-pay

## 2020-12-19 DIAGNOSIS — Z1231 Encounter for screening mammogram for malignant neoplasm of breast: Secondary | ICD-10-CM

## 2020-12-20 ENCOUNTER — Other Ambulatory Visit: Payer: Self-pay

## 2020-12-20 ENCOUNTER — Ambulatory Visit (INDEPENDENT_AMBULATORY_CARE_PROVIDER_SITE_OTHER): Payer: BC Managed Care – PPO | Admitting: Internal Medicine

## 2020-12-20 ENCOUNTER — Encounter: Payer: Self-pay | Admitting: Internal Medicine

## 2020-12-20 VITALS — BP 138/82 | HR 78 | Temp 98.1°F | Ht 62.99 in | Wt 185.6 lb

## 2020-12-20 DIAGNOSIS — R739 Hyperglycemia, unspecified: Secondary | ICD-10-CM

## 2020-12-20 DIAGNOSIS — E7800 Pure hypercholesterolemia, unspecified: Secondary | ICD-10-CM | POA: Diagnosis not present

## 2020-12-20 DIAGNOSIS — D751 Secondary polycythemia: Secondary | ICD-10-CM

## 2020-12-20 DIAGNOSIS — Z0000 Encounter for general adult medical examination without abnormal findings: Secondary | ICD-10-CM | POA: Diagnosis not present

## 2020-12-20 DIAGNOSIS — D582 Other hemoglobinopathies: Secondary | ICD-10-CM

## 2020-12-20 DIAGNOSIS — Z85038 Personal history of other malignant neoplasm of large intestine: Secondary | ICD-10-CM

## 2020-12-20 DIAGNOSIS — G4730 Sleep apnea, unspecified: Secondary | ICD-10-CM

## 2020-12-20 DIAGNOSIS — I10 Essential (primary) hypertension: Secondary | ICD-10-CM | POA: Diagnosis not present

## 2020-12-20 NOTE — Assessment & Plan Note (Addendum)
Physical today 12/20/20.  Mammogram 12/19/19 - Birads I.  Colonoscopy 10/25/19 - patent end to end ileo-colonic anastomosis, entire colon normal, non bleeding internal hemorrhoids.  Recommended f/u in 3 years.

## 2020-12-20 NOTE — Progress Notes (Signed)
[name]: Susan Weeks, female   DOB: 21-Jul-1950, 70 y.o.   MRN: [id number]   Subjective:    [name]: Susan Weeks, female    DOB: Feb 03, 1951, 70 y.o.   MRN: [id number]  HPI This visit occurred during the SARS-CoV-2 public health emergency.  Safety protocols were in place, including screening questions prior to the visit, additional usage of staff PPE, and extensive cleaning of exam room while observing appropriate contact time as indicated for disinfecting solutions.   Patient here for her physical exam.  Seeing oncology for f/u - liver lesions:  extensive w/up.  No hypermetabolism on PET.  Contrasted enhanced ultrasound showed likely benign hemangioma.  Recommended to repeat another contrast-enhanced ultrasound in 6 to 12 months.  Following CEA level.  Also being followed by hematology for erythrocytosis.  Hemoglobin stable at 15.5.  Continue CPAP.  She states she is doing relatively well.  Discussed diet and exercise.  She has been eating out more.  Plans to adjust her diet.  Discussed labs.  Cholesterol within normal limits.  LDL 70.  No chest pain or shortness of breath reported.  No abdominal pain or bowel change reported.  Past Medical History:  Diagnosis Date   Abnormal liver function    Colon cancer ([place])    adenocarcinoma of the cecum s/p partial colon resection   History of colon polyps    Hyperlipidemia    Hypertension    Past Surgical History:  Procedure Laterality Date   ABDOMINAL HYSTERECTOMY  1984   secondary to bleeding and fibroid tumors   APPENDECTOMY     COLONOSCOPY W/ POLYPECTOMY  2001   COLONOSCOPY WITH PROPOFOL N/A 07/28/2016   Procedure: COLONOSCOPY WITH PROPOFOL;  Surgeon: [name], MD;  Location: [facility] ENDOSCOPY;  Service: Endoscopy;  Laterality: N/A;   Family History  Problem Relation Age of Onset   Stroke Mother    Hypertension Mother    Cancer Father        Prostate    Heart disease Brother        myocardial infarction   Hypertension  Brother    Hypertension Sister        x4   Breast cancer Neg Hx    Social History   Socioeconomic History   Marital status: Married    Spouse name: Eddie   Number of children: 1   Years of education: HS   Highest education level: Not on file  Occupational History    Employer: OTHER    Comment: [employer]  Tobacco Use   Smoking status: Former    Types: Cigarettes    Quit date: 1978    Years since quitting: 44.6   Smokeless tobacco: Never  Vaping Use   Vaping Use: Never used  Substance and Sexual Activity   Alcohol use: No    Alcohol/week: 0.0 standard drinks   Drug use: No   Sexual activity: Not on file  Other Topics Concern   Not on file  Social History Narrative   Not on file   Social Determinants of Health   Financial Resource Strain: Not on file  Food Insecurity: Not on file  Transportation Needs: Not on file  Physical Activity: Not on file  Stress: Not on file  Social Connections: Not on file    Review of Systems  Constitutional:  Negative for fatigue and unexpected weight change.  HENT:  Negative for congestion and sinus pressure.   Eyes:  Negative for pain and visual disturbance.  Respiratory:  Negative for cough, chest tightness and shortness of breath.   Cardiovascular:  Negative for chest pain, palpitations and leg swelling.  Gastrointestinal:  Negative for abdominal pain, diarrhea, nausea and vomiting.  Genitourinary:  Negative for difficulty urinating and dysuria.  Musculoskeletal:  Negative for joint swelling and myalgias.  Skin:  Negative for color change and rash.  Neurological:  Negative for dizziness and headaches.  Hematological:  Negative for adenopathy. Does not bruise/bleed easily.  Psychiatric/Behavioral:  Negative for agitation and dysphoric mood.       Objective:    Physical Exam Vitals reviewed.  Constitutional:      General: She is not in acute distress.    Appearance: Normal appearance. She is well-developed.  HENT:      Head: Normocephalic and atraumatic.     Right Ear: External ear normal.     Left Ear: External ear normal.  Eyes:     General: No scleral icterus.       Right eye: No discharge.        Left eye: No discharge.     Conjunctiva/sclera: Conjunctivae normal.  Neck:     Thyroid: No thyromegaly.  Cardiovascular:     Rate and Rhythm: Normal rate and regular rhythm.  Pulmonary:     Effort: No tachypnea, accessory muscle usage or respiratory distress.     Breath sounds: Normal breath sounds. No decreased breath sounds or wheezing.  Chest:  Breasts:    Right: No inverted nipple, mass, nipple discharge or tenderness (no axillary adenopathy).     Left: No inverted nipple, mass, nipple discharge or tenderness (no axilarry adenopathy).  Abdominal:     General: Bowel sounds are normal.     Palpations: Abdomen is soft.     Tenderness: There is no abdominal tenderness.  Musculoskeletal:        General: No swelling or tenderness.     Cervical back: Neck supple.  Lymphadenopathy:     Cervical: No cervical adenopathy.  Skin:    Findings: No erythema or rash.  Neurological:     Mental Status: She is alert and oriented to person, place, and time.  Psychiatric:        Mood and Affect: Mood normal.        Behavior: Behavior normal.    BP 138/82   Pulse 78   Temp 98.1 F (36.7 C)   Ht 5' 2.99" (1.6 m)   Wt 185 lb 9.6 oz (84.2 kg)   LMP 06/28/1982   SpO2 97%   BMI 32.89 kg/m  Wt Readings from Last 3 Encounters:  12/20/20 185 lb 9.6 oz (84.2 kg)  11/30/20 185 lb 6.4 oz (84.1 kg)  09/11/20 184 lb (83.5 kg)    Outpatient Encounter Medications as of 12/20/2020  Medication Sig   amLODipine (NORVASC) 5 MG tablet TAKE 1 TABLET BY MOUTH EVERY DAY   aspirin EC 81 MG tablet Take 1 tablet (81 mg total) by mouth daily.   calcium carbonate (OS-CAL) 600 MG TABS Take 600 mg by mouth daily.    rosuvastatin (CRESTOR) 5 MG tablet TAKE 1 TABLET BY MOUTH EVERY DAY   vitamin E 400 UNIT capsule Take 400  Units by mouth daily.    [DISCONTINUED] lisinopril (ZESTRIL) 30 MG tablet TAKE 1 TABLET BY MOUTH EVERY DAY   No facility-administered encounter medications on file as of 12/20/2020.     Lab Results  Component Value Date   WBC 4.0 11/30/2020   HGB 15.5 (H) 11/30/2020  HCT 46.0 11/30/2020   PLT 209 11/30/2020   GLUCOSE 108 (H) 11/30/2020   CHOL 147 11/30/2020   TRIG 79 11/30/2020   HDL 61 11/30/2020   LDLDIRECT 150.8 03/02/2013   LDLCALC 70 11/30/2020   ALT 34 11/30/2020   AST 28 11/30/2020   NA 139 11/30/2020   K 3.9 11/30/2020   CL 103 11/30/2020   CREATININE 0.82 11/30/2020   BUN 13 11/30/2020   CO2 29 11/30/2020   TSH 1.127 11/30/2020   INR 1.0 05/02/2020   HGBA1C 5.9 11/07/2020    MM 3D SCREEN BREAST BILATERAL  Result Date: 12/20/2020 CLINICAL DATA:  Screening. EXAM: DIGITAL SCREENING BILATERAL MAMMOGRAM WITH TOMOSYNTHESIS AND CAD TECHNIQUE: Bilateral screening digital craniocaudal and mediolateral oblique mammograms were obtained. Bilateral screening digital breast tomosynthesis was performed. The images were evaluated with computer-aided detection. COMPARISON:  Previous exam(s). ACR Breast Density Category b: There are scattered areas of fibroglandular density. FINDINGS: There are no findings suspicious for malignancy. IMPRESSION: No mammographic evidence of malignancy. A result letter of this screening mammogram will be mailed directly to the patient. RECOMMENDATION: Screening mammogram in one year. (Code:SM-B-01Y) BI-RADS CATEGORY  1: Negative. Electronically Signed   By: Audie Pinto M.D.   On: 12/20/2020 08:59      Assessment & Plan:   Problem List Items Addressed This Visit     Elevated hemoglobin ([place])    Followed by hematology as outlined.  Continue cpap.       Erythrocytosis    Followed by hematology.  hgb stable - 15.5.  Continue cpap.        Essential hypertension, benign    Had swelling on lisinopril.  On amlodipine.  Diet adjustment and  exercise.  Follow pressures.  If remains elevated, will need medication adjustment.  Follow pressures.  Follow metabolic panel.       Health care maintenance    Physical today 12/20/20.  Mammogram 12/19/19 - Birads I.  Colonoscopy 10/25/19 - patent end to end ileo-colonic anastomosis, entire colon normal, non bleeding internal hemorrhoids.  Recommended f/u in 3 years.        History of colon cancer    Colonoscopy 10/2019 - recommended f/u in 3 years.       Hyperglycemia    Low carb diet and exercise.  Follow met b and a1c.       Pure hypercholesterolemia    Continues on crestor.  Low cholesterol diet and exercise.  Follow lipid panel and liver function tests.   Lab Results  Component Value Date   CHOL 147 11/30/2020   HDL 61 11/30/2020   LDLCALC 70 11/30/2020   LDLDIRECT 150.8 03/02/2013   TRIG 79 11/30/2020   CHOLHDL 2.4 11/30/2020       Sleep apnea    Continue cpap.       Other Visit Diagnoses     Routine general medical examination at a health care facility    -  Primary        [name], MD

## 2020-12-24 ENCOUNTER — Encounter: Payer: Self-pay | Admitting: Internal Medicine

## 2020-12-24 NOTE — Assessment & Plan Note (Signed)
Low carb diet and exercise.  Follow met b and a1c.  

## 2020-12-24 NOTE — Assessment & Plan Note (Signed)
Continue cpap.  

## 2020-12-24 NOTE — Assessment & Plan Note (Signed)
Continues on crestor.  Low cholesterol diet and exercise.  Follow lipid panel and liver function tests.   Lab Results  Component Value Date   CHOL 147 11/30/2020   HDL 61 11/30/2020   LDLCALC 70 11/30/2020   LDLDIRECT 150.8 03/02/2013   TRIG 79 11/30/2020   CHOLHDL 2.4 11/30/2020

## 2020-12-24 NOTE — Assessment & Plan Note (Signed)
Colonoscopy 10/2019 - recommended f/u in 3 years.  

## 2020-12-24 NOTE — Assessment & Plan Note (Signed)
Had swelling on lisinopril.  On amlodipine.  Diet adjustment and exercise.  Follow pressures.  If remains elevated, will need medication adjustment.  Follow pressures.  Follow metabolic panel.

## 2020-12-24 NOTE — Assessment & Plan Note (Signed)
Followed by hematology.  hgb stable - 15.5.  Continue cpap.

## 2020-12-24 NOTE — Assessment & Plan Note (Signed)
Followed by hematology as outlined.  Continue cpap.  

## 2021-01-09 DIAGNOSIS — S93409A Sprain of unspecified ligament of unspecified ankle, initial encounter: Secondary | ICD-10-CM | POA: Insufficient documentation

## 2021-01-21 ENCOUNTER — Ambulatory Visit (INDEPENDENT_AMBULATORY_CARE_PROVIDER_SITE_OTHER): Payer: BC Managed Care – PPO | Admitting: Dermatology

## 2021-01-21 ENCOUNTER — Other Ambulatory Visit: Payer: Self-pay

## 2021-01-21 DIAGNOSIS — L819 Disorder of pigmentation, unspecified: Secondary | ICD-10-CM

## 2021-01-21 NOTE — Progress Notes (Signed)
   New Patient Visit  Subjective  Susan Weeks is a 70 y.o. female who presents for the following: Skin Problem (Patient here today for a spot at left hip. Patient thinks she bumped it about in February or March and had a bruise. Patient advises it has gotten much lighter but she would like to make sure it's ok. ).  The following portions of the chart were reviewed this encounter and updated as appropriate:   Tobacco  Allergies  Meds  Problems  Med Hx  Surg Hx  Fam Hx     Review of Systems:  No other skin or systemic complaints except as noted in HPI or Assessment and Plan.  Objective  Well appearing patient in no apparent distress; mood and affect are within normal limits.  A focused examination was performed including left hip. Relevant physical exam findings are noted in the Assessment and Plan.  left hip 6.0 x 8.0cm area of hyperpigmentation       Assessment & Plan  Dyschromia -likely secondary to injury and bruise from hitting it on a counter corner.  Already improving and fading. left hip Post inflammatory pigment alteration from bruise and injury in the area Reassured all appears okay.  Discussed treatment options with anti-inflammatory and topical fading creams. Benign-appearing.  Observation.  Call clinic for new or changing lesions.  Recommend daily use of broad spectrum spf 30+ sunscreen to sun-exposed areas.   Patient advised she could use a topical lightening cream from [employer] 12% hydroquinone, patient defers today.  Return if symptoms worsen or fail to improve.  Graciella Belton, RMA, am acting as scribe for Sarina Ser, MD . Documentation: I have reviewed the above documentation for accuracy and completeness, and I agree with the above.  Sarina Ser, MD

## 2021-01-21 NOTE — Patient Instructions (Signed)

## 2021-01-22 ENCOUNTER — Encounter: Payer: Self-pay | Admitting: Dermatology

## 2021-03-12 ENCOUNTER — Other Ambulatory Visit: Payer: Self-pay

## 2021-03-12 ENCOUNTER — Ambulatory Visit (INDEPENDENT_AMBULATORY_CARE_PROVIDER_SITE_OTHER): Payer: BC Managed Care – PPO | Admitting: Internal Medicine

## 2021-03-12 ENCOUNTER — Encounter: Payer: Self-pay | Admitting: Internal Medicine

## 2021-03-12 VITALS — BP 142/82 | HR 91 | Temp 97.8°F | Resp 16 | Ht 63.0 in | Wt 187.0 lb

## 2021-03-12 DIAGNOSIS — E7800 Pure hypercholesterolemia, unspecified: Secondary | ICD-10-CM

## 2021-03-12 DIAGNOSIS — R739 Hyperglycemia, unspecified: Secondary | ICD-10-CM

## 2021-03-12 DIAGNOSIS — Z23 Encounter for immunization: Secondary | ICD-10-CM | POA: Diagnosis not present

## 2021-03-12 DIAGNOSIS — D582 Other hemoglobinopathies: Secondary | ICD-10-CM

## 2021-03-12 DIAGNOSIS — I10 Essential (primary) hypertension: Secondary | ICD-10-CM

## 2021-03-12 DIAGNOSIS — R0602 Shortness of breath: Secondary | ICD-10-CM | POA: Insufficient documentation

## 2021-03-12 DIAGNOSIS — D72819 Decreased white blood cell count, unspecified: Secondary | ICD-10-CM

## 2021-03-12 DIAGNOSIS — Z85038 Personal history of other malignant neoplasm of large intestine: Secondary | ICD-10-CM

## 2021-03-12 DIAGNOSIS — G4730 Sleep apnea, unspecified: Secondary | ICD-10-CM

## 2021-03-12 MED ORDER — HYDROCHLOROTHIAZIDE 12.5 MG PO CAPS: 12.5000 mg | ORAL_CAPSULE | Freq: Every day | ORAL | 1 refills | Status: DC

## 2021-03-12 NOTE — Progress Notes (Signed)
[name]: Susan Weeks, female   DOB: 01/31/1951, 70 y.o.   MRN: [id number]   Subjective:    [name]: Susan Weeks, female    DOB: 01/23/1951, 70 y.o.   MRN: [id number]  This visit occurred during the SARS-CoV-2 public health emergency.  Safety protocols were in place, including screening questions prior to the visit, additional usage of staff PPE, and extensive cleaning of exam room while observing appropriate contact time as indicated for disinfecting solutions.   Patient here for a scheduled follow up.   Chief Complaint  Patient presents with   Hypertension   .   HPI Here to follow up regarding her blood pressure.  Blood pressure is elevated - reviewed outside readings 140-160/60-70.  Taking amlodipine.  Discussed restarting hctz.  Has been taking meloxicam for her left ankle.  Plans to stop.  Not having pain now.  Has noticed when she walks fast - she "gives out".  No chest pain.  No increased cough or congestion.  No acid reflux reported.  No abdominal pain.  Bowels moving.     Past Medical History:  Diagnosis Date   Abnormal liver function    Colon cancer (HCC)    adenocarcinoma of the cecum s/p partial colon resection   History of colon polyps    Hyperlipidemia    Hypertension    Past Surgical History:  Procedure Laterality Date   ABDOMINAL HYSTERECTOMY  1984   secondary to bleeding and fibroid tumors   APPENDECTOMY     COLONOSCOPY W/ POLYPECTOMY  2001   COLONOSCOPY WITH PROPOFOL N/A 07/28/2016   Procedure: COLONOSCOPY WITH PROPOFOL;  Surgeon: Martin U Skulskie, MD;  Location: [facility] ENDOSCOPY;  Service: Endoscopy;  Laterality: N/A;   Family History  Problem Relation Age of Onset   Stroke Mother    Hypertension Mother    Cancer Father        Prostate    Heart disease Brother        myocardial infarction   Hypertension Brother    Hypertension Sister        x4   Breast cancer Neg Hx    Social History   Socioeconomic History   Marital status: Married     Spouse name: Eddie   Number of children: 1   Years of education: HS   Highest education level: Not on file  Occupational History    Employer: OTHER    Comment: [employer]  Tobacco Use   Smoking status: Former    Types: Cigarettes    Quit date: 1978    Years since quitting: 44.8   Smokeless tobacco: Never  Vaping Use   Vaping Use: Never used  Substance and Sexual Activity   Alcohol use: No    Alcohol/week: 0.0 standard drinks   Drug use: No   Sexual activity: Not on file  Other Topics Concern   Not on file  Social History Narrative   Not on file   Social Determinants of Health   Financial Resource Strain: Not on file  Food Insecurity: Not on file  Transportation Needs: Not on file  Physical Activity: Not on file  Stress: Not on file  Social Connections: Not on file     Review of Systems  Constitutional:  Negative for appetite change and unexpected weight change.  HENT:  Negative for congestion and sinus pressure.   Respiratory:  Negative for cough and chest tightness.   Cardiovascular:  Negative for chest pain and palpitations.  Gastrointestinal:    Negative for abdominal pain, diarrhea, nausea and vomiting.  Genitourinary:  Negative for difficulty urinating and dysuria.  Musculoskeletal:  Negative for joint swelling and myalgias.  Skin:  Negative for color change and rash.  Neurological:  Negative for dizziness, light-headedness and headaches.  Psychiatric/Behavioral:  Negative for agitation and dysphoric mood.       Objective:     BP (!) 142/82   Pulse 91   Temp 97.8 F (36.6 C)   Resp 16   Ht 5' 3" (1.6 m)   Wt 187 lb (84.8 kg)   LMP 06/28/1982   SpO2 97%   BMI 33.13 kg/m  Wt Readings from Last 3 Encounters:  03/12/21 187 lb (84.8 kg)  12/20/20 185 lb 9.6 oz (84.2 kg)  11/30/20 185 lb 6.4 oz (84.1 kg)    Physical Exam Vitals reviewed.  Constitutional:      General: She is not in acute distress.    Appearance: Normal appearance.  HENT:      Head: Normocephalic and atraumatic.     Right Ear: External ear normal.     Left Ear: External ear normal.  Eyes:     General: No scleral icterus.       Right eye: No discharge.        Left eye: No discharge.     Conjunctiva/sclera: Conjunctivae normal.  Neck:     Thyroid: No thyromegaly.  Cardiovascular:     Rate and Rhythm: Normal rate and regular rhythm.  Pulmonary:     Effort: No respiratory distress.     Breath sounds: Normal breath sounds. No wheezing.  Abdominal:     General: Bowel sounds are normal.     Palpations: Abdomen is soft.     Tenderness: There is no abdominal tenderness.  Musculoskeletal:        General: No swelling or tenderness.     Cervical back: Neck supple. No tenderness.  Lymphadenopathy:     Cervical: No cervical adenopathy.  Skin:    Findings: No erythema or rash.  Neurological:     Mental Status: She is alert.  Psychiatric:        Mood and Affect: Mood normal.        Behavior: Behavior normal.     Outpatient Encounter Medications as of 03/12/2021  Medication Sig   hydrochlorothiazide (MICROZIDE) 12.5 MG capsule Take 1 capsule (12.5 mg total) by mouth daily.   amLODipine (NORVASC) 5 MG tablet TAKE 1 TABLET BY MOUTH EVERY DAY   aspirin EC 81 MG tablet Take 1 tablet (81 mg total) by mouth daily.   calcium carbonate (OS-CAL) 600 MG TABS Take 600 mg by mouth daily.    meloxicam (MOBIC) 15 MG tablet Take 1 tablet by mouth daily.   rosuvastatin (CRESTOR) 5 MG tablet TAKE 1 TABLET BY MOUTH EVERY DAY   vitamin E 400 UNIT capsule Take 400 Units by mouth daily.    [DISCONTINUED] lisinopril (ZESTRIL) 30 MG tablet TAKE 1 TABLET BY MOUTH EVERY DAY   No facility-administered encounter medications on file as of 03/12/2021.     Lab Results  Component Value Date   WBC 4.0 11/30/2020   HGB 15.5 (H) 11/30/2020   HCT 46.0 11/30/2020   PLT 209 11/30/2020   GLUCOSE 108 (H) 11/30/2020   CHOL 147 11/30/2020   TRIG 79 11/30/2020   HDL 61 11/30/2020    LDLDIRECT 150.8 03/02/2013   LDLCALC 70 11/30/2020   ALT 34 11/30/2020   AST 28 11/30/2020  NA 139 11/30/2020   K 3.9 11/30/2020   CL 103 11/30/2020   CREATININE 0.82 11/30/2020   BUN 13 11/30/2020   CO2 29 11/30/2020   TSH 1.127 11/30/2020   INR 1.0 05/02/2020   HGBA1C 5.9 11/07/2020    MM 3D SCREEN BREAST BILATERAL  Result Date: 12/20/2020 CLINICAL DATA:  Screening. EXAM: DIGITAL SCREENING BILATERAL MAMMOGRAM WITH TOMOSYNTHESIS AND CAD TECHNIQUE: Bilateral screening digital craniocaudal and mediolateral oblique mammograms were obtained. Bilateral screening digital breast tomosynthesis was performed. The images were evaluated with computer-aided detection. COMPARISON:  Previous exam(s). ACR Breast Density Category b: There are scattered areas of fibroglandular density. FINDINGS: There are no findings suspicious for malignancy. IMPRESSION: No mammographic evidence of malignancy. A result letter of this screening mammogram will be mailed directly to the patient. RECOMMENDATION: Screening mammogram in one year. (Code:SM-B-01Y) BI-RADS CATEGORY  1: Negative. Electronically Signed   By: Audie Pinto M.D.   On: 12/20/2020 08:59      Assessment & Plan:   Problem List Items Addressed This Visit     Elevated hemoglobin ([place])    Followed by hematology as outlined.  Continue cpap.       Essential hypertension, benign    Had swelling on lisinopril.  On amlodipine. Blood pressure remains elevated.  Start hctz 12.75m q day. Follow pressures.  Follow metabolic panel. Since not having pain now - will stop meloxicam.       Relevant Medications   hydrochlorothiazide (MICROZIDE) 12.5 MG capsule   Other Relevant Orders   Basic metabolic panel   History of colon cancer    Colonoscopy 10/2019 - recommended f/u in 3 years.       Hyperglycemia - Primary    Low carb diet and exercise.  Follow met b and a1c.       Relevant Orders   Hemoglobin A1c   Leukopenia    Follow cbc.       Pure  hypercholesterolemia    Continues on crestor.  Low cholesterol diet and exercise.  Follow lipid panel and liver function tests.   Lab Results  Component Value Date   CHOL 147 11/30/2020   HDL 61 11/30/2020   LDLCALC 70 11/30/2020   LDLDIRECT 150.8 03/02/2013   TRIG 79 11/30/2020   CHOLHDL 2.4 11/30/2020       Relevant Medications   hydrochlorothiazide (MICROZIDE) 12.5 MG capsule   Other Relevant Orders   Hepatic function panel   Lipid panel   Sleep apnea    Continue cpap.       SOB (shortness of breath) on exertion    Reported symptoms with increased exertion.  EKG - SR with no acute ischemic changes.  Given risk factors and change in symptoms, will refer to cardiology for further evaluation.        Relevant Orders   EKG 12-Lead (Completed)   Ambulatory referral to Cardiology   Other Visit Diagnoses     Need for immunization against influenza       Relevant Orders   Flu Vaccine QUAD High Dose(Fluad) (Completed)        CEinar Pheasant MD

## 2021-03-17 ENCOUNTER — Encounter: Payer: Self-pay | Admitting: Internal Medicine

## 2021-03-17 NOTE — Assessment & Plan Note (Signed)
Reported symptoms with increased exertion.  EKG - SR with no acute ischemic changes.  Given risk factors and change in symptoms, will refer to cardiology for further evaluation.

## 2021-03-17 NOTE — Assessment & Plan Note (Addendum)
Had swelling on lisinopril.  On amlodipine. Blood pressure remains elevated.  Start hctz 12.5mg  q day. Follow pressures.  Follow metabolic panel. Since not having pain now - will stop meloxicam.

## 2021-03-17 NOTE — Assessment & Plan Note (Signed)
Colonoscopy 10/2019 - recommended f/u in 3 years.  

## 2021-03-17 NOTE — Assessment & Plan Note (Signed)
Continues on crestor.  Low cholesterol diet and exercise.  Follow lipid panel and liver function tests.   Lab Results  Component Value Date   CHOL 147 11/30/2020   HDL 61 11/30/2020   LDLCALC 70 11/30/2020   LDLDIRECT 150.8 03/02/2013   TRIG 79 11/30/2020   CHOLHDL 2.4 11/30/2020

## 2021-03-17 NOTE — Assessment & Plan Note (Signed)
Followed by hematology as outlined.  Continue cpap.  

## 2021-03-17 NOTE — Assessment & Plan Note (Signed)
Follow cbc.  

## 2021-03-17 NOTE — Assessment & Plan Note (Signed)
Low carb diet and exercise.  Follow met b and a1c.  

## 2021-03-17 NOTE — Assessment & Plan Note (Signed)
Continue cpap.  

## 2021-03-19 ENCOUNTER — Encounter: Payer: Self-pay | Admitting: Cardiovascular Disease

## 2021-03-19 ENCOUNTER — Other Ambulatory Visit: Payer: Self-pay

## 2021-03-19 ENCOUNTER — Ambulatory Visit (INDEPENDENT_AMBULATORY_CARE_PROVIDER_SITE_OTHER): Payer: BC Managed Care – PPO | Admitting: Cardiovascular Disease

## 2021-03-19 VITALS — BP 130/78 | HR 88 | Ht 63.0 in | Wt 187.4 lb

## 2021-03-19 DIAGNOSIS — R072 Precordial pain: Secondary | ICD-10-CM | POA: Diagnosis not present

## 2021-03-19 DIAGNOSIS — E785 Hyperlipidemia, unspecified: Secondary | ICD-10-CM

## 2021-03-19 DIAGNOSIS — R011 Cardiac murmur, unspecified: Secondary | ICD-10-CM

## 2021-03-19 DIAGNOSIS — I10 Essential (primary) hypertension: Secondary | ICD-10-CM

## 2021-03-19 MED ORDER — METOPROLOL TARTRATE 100 MG PO TABS
ORAL_TABLET | ORAL | 0 refills | Status: DC
Start: 2021-03-19 — End: 2021-04-15

## 2021-03-19 NOTE — Patient Instructions (Signed)
Medication Instructions:  Your physician recommends that you continue on your current medications as directed. Please refer to the Current Medication list given to you today.   A one time dose of Metoprolol 100 mg tablet to be taken 2 hours prior to your Cardiac CTA has been sent to your pharmacy.  *If you need a refill on your cardiac medications before your next appointment, please call your pharmacy*   Lab Work: Bmp today If you have labs (blood work) drawn today and your tests are completely normal, you will receive your results only by: [place] (if you have MyChart) OR A paper copy in the mail If you have any lab test that is abnormal or we need to change your treatment, we will call you to review the results.   Testing/Procedures: Your physician has requested that you have an echocardiogram. Echocardiography is a painless test that uses sound waves to create images of your heart. It provides your doctor with information about the size and shape of your heart and how well your heart's chambers and valves are working. This procedure takes approximately one hour. There are no restrictions for this procedure.  Your physician has requested that you have cardiac CT. Cardiac computed tomography (CT) is a painless test that uses an x-ray machine to take clear, detailed pictures of your heart. For further information please visit [email or web address]. Please follow instruction sheet as given.     Follow-Up: At [facility], you and your health needs are our priority.  As part of our continuing mission to provide you with exceptional heart care, we have created designated Provider Care Teams.  These Care Teams include your primary Cardiologist (physician) and Advanced Practice Providers (APPs -  Physician Assistants and Nurse Practitioners) who all work together to provide you with the care you need, when you need it.  We recommend signing up for the patient portal called "MyChart".   Sign up information is provided on this After Visit Summary.  MyChart is used to connect with patients for Virtual Visits (Telemedicine).  Patients are able to view lab/test results, encounter notes, upcoming appointments, etc.  Non-urgent messages can be sent to your provider as well.   To learn more about what you can do with MyChart, go to [email or web address].    Your next appointment:   As needed  The format for your next appointment:   In Person  Provider:   You may see [name], MD or one of the following Advanced Practice Providers on your designated Care Team:   Murray Hodgkins, NP Christell Faith, PA-C Cadence Kathlen Mody, [place]    Other Instructions   Your cardiac CT will be scheduled at one of the below locations:   [facility] [address] [place], [place] [address] [phone]  [place] [address] [place], [place] [address] [phone]  If scheduled at [facility], please arrive at the [facility] main entrance (entrance A) of [facility] 30 minutes prior to test start time. You can use the FREE valet parking offered at the main entrance (encouraged to control the heart rate for the test) Proceed to the [facility] Radiology Department (first floor) to check-in and test prep.  If scheduled at [facility], please arrive 15 mins early for check-in and test prep.  Please follow these instructions carefully (unless otherwise directed):    On the Night Before the Test: Be sure to Drink plenty  of water. Do not consume any caffeinated/decaffeinated beverages or chocolate 12 hours prior to your test. Do not take any antihistamines 12 hours prior to your test.  On the Day of the Test: Drink plenty of water until 1 hour prior to the test. Do not eat any food 4 hours prior to the test. You may take your regular medications prior to the test.  Take  metoprolol (Lopressor) two hours prior to test. HOLD Hydrochlorothiazide morning of the test. FEMALES- please wear underwire-free bra if available, avoid dresses & tight clothing         After the Test: Drink plenty of water. After receiving IV contrast, you may experience a mild flushed feeling. This is normal. On occasion, you may experience a mild rash up to 24 hours after the test. This is not dangerous. If this occurs, you can take Benadryl 25 mg and increase your fluid intake. If you experience trouble breathing, this can be serious. If it is severe call 911 IMMEDIATELY. If it is mild, please call our office. If you take any of these medications: Glipizide/Metformin, Avandament, Glucavance, please do not take 48 hours after completing test unless otherwise instructed.  Please allow 2-4 weeks for scheduling of routine cardiac CTs. Some insurance companies require a pre-authorization which may delay scheduling of this test.   For non-scheduling related questions, please contact the cardiac imaging nurse navigator should you have any questions/concerns: Marchia Bond, Cardiac Imaging Nurse Navigator Gordy Clement, Cardiac Imaging Nurse Navigator [facility] Direct Office Dial: [phone]   For scheduling needs, including cancellations and rescheduling, please call [place], [phone].

## 2021-03-19 NOTE — H&P (View-Only) (Signed)
Cardiology Office Note   Date:  03/19/2021   ID:  Susan Weeks, DOB 20-Oct-1950, MRN [id number]  PCP:  [name], MD  Cardiologist:   [name], MD   Chief Complaint  Patient presents with   Other    SOB c/o edema ankles. Meds reviewed verbally with pt.      History of Present Illness: Susan Weeks is a 70 y.o. female who was referred by Dr. [name] for evaluation of exertional dyspnea.  She has no prior cardiac history. She has known history of essential hypertension, hyperlipidemia, erythrocytosis thought to be due to untreated sleep apnea which improved after starting CPAP treatment and adenocarcinoma of the cecum status post partial colon resection. No prior cardiac testing.  She reports recent exertional dyspnea happening with moderate activities with increased leg edema.  This started few months ago.  She had 1 episode of chest fullness after a meal with no recurrent symptoms.  She denies tightness or heaviness with exertion.  She reports that her leg edema improved after she was started on small dose hydrochlorothiazide.  She quit smoking in 1978.  Family history is remarkable for TIA affecting her mother. No orthopnea or PND.  No palpitations or tachycardia.    Past Medical History:  Diagnosis Date   Abnormal liver function    Colon cancer ([place])    adenocarcinoma of the cecum s/p partial colon resection   History of colon polyps    Hyperlipidemia    Hypertension     Past Surgical History:  Procedure Laterality Date   ABDOMINAL HYSTERECTOMY  1984   secondary to bleeding and fibroid tumors   APPENDECTOMY     COLONOSCOPY W/ POLYPECTOMY  2001   COLONOSCOPY WITH PROPOFOL N/A 07/28/2016   Procedure: COLONOSCOPY WITH PROPOFOL;  Surgeon: [name], MD;  [facility] ENDOSCOPY;  Service: Endoscopy;  Laterality: N/A;     Current Outpatient Medications  Medication Sig Dispense Refill   amLODipine (NORVASC) 5 MG tablet TAKE 1 TABLET BY MOUTH  EVERY DAY 90 tablet 1   aspirin EC 81 MG tablet Take 1 tablet (81 mg total) by mouth daily. 90 tablet 3   calcium carbonate (OS-CAL) 600 MG TABS Take 600 mg by mouth daily.      hydrochlorothiazide (MICROZIDE) 12.5 MG capsule Take 1 capsule (12.5 mg total) by mouth daily. 30 capsule 1   rosuvastatin (CRESTOR) 5 MG tablet TAKE 1 TABLET BY MOUTH EVERY DAY 90 tablet 3   vitamin E 400 UNIT capsule Take 400 Units by mouth daily.      No current facility-administered medications for this visit.    Allergies:   Lisinopril and Z-pak [azithromycin]    Social History:  The patient  reports that she quit smoking about 44 years ago. Her smoking use included cigarettes. She has never used smokeless tobacco. She reports that she does not drink alcohol and does not use drugs.   Family History:  The patient's family history includes Cancer in her father; Heart attack in her brother; Heart disease in her brother; Hypertension in her brother, mother, and sister; Stroke in her mother.    ROS:  Please see the history of present illness.   Otherwise, review of systems are positive for none.   All other systems are reviewed and negative.    PHYSICAL EXAM: VS:  BP 130/78 (BP Location: Right Arm, Patient Position: Sitting, Cuff Size: Normal)   Pulse 88   Ht 5\' 3"  (1.6 m)  Wt 187 lb 6 oz (85 kg)   LMP 06/28/1982   SpO2 98%   BMI 33.19 kg/m  , BMI Body mass index is 33.19 kg/m. GEN: Well nourished, well developed, in no acute distress  HEENT: normal  Neck: no JVD, carotid bruits, or masses Cardiac: RRR; no rubs, or gallops.  2 out of 6 systolic murmur in the aortic area.  Mild right leg edema Respiratory:  clear to auscultation bilaterally, normal work of breathing GI: soft, nontender, nondistended, + BS MS: no deformity or atrophy  Skin: warm and dry, no rash Neuro:  Strength and sensation are intact Psych: euthymic mood, full affect   EKG:  EKG is ordered today. The ekg ordered today  demonstrates normal sinus rhythm with no significant ST or T wave changes.  No evidence of infarct.   Recent Labs: 11/30/2020: ALT 34; BUN 13; Creatinine, Ser 0.82; Hemoglobin 15.5; Platelets 209; Potassium 3.9; Sodium 139; TSH 1.127    Lipid Panel    Component Value Date/Time   CHOL 147 11/30/2020 1145   TRIG 79 11/30/2020 1145   HDL 61 11/30/2020 1145   CHOLHDL 2.4 11/30/2020 1145   VLDL 16 11/30/2020 1145   LDLCALC 70 11/30/2020 1145   LDLDIRECT 150.8 03/02/2013 0800      Wt Readings from Last 3 Encounters:  03/19/21 187 lb 6 oz (85 kg)  03/12/21 187 lb (84.8 kg)  12/20/20 185 lb 9.6 oz (84.2 kg)       PAD Screen 03/19/2021  Previous PAD dx? No  Previous surgical procedure? Yes  Pain with walking? No  Feet/toe relief with dangling? No  Painful, non-healing ulcers? No  Extremities discolored? No      ASSESSMENT AND PLAN:  1.  Exertional dyspnea: Possible anginal equivalent given her risk factors and new onset.  Fortunately, her EKG does not show any ischemic changes.  I discussed different management options and recommend proceeding with cardiac CTA.  She is not allergic to contrast.  2.  Cardiac murmur: Seems to be suggestive of aortic sclerosis and possible mild stenosis.  Given her symptoms, I requested an echocardiogram.  3.  Essential hypertension: Blood pressure is reasonably controlled on current medications.  4.  Hyperlipidemia: Most recent lipid profile showed an LDL of 70.  She is currently on small dose rosuvastatin 5 mg daily.    Disposition: Proceed with cardiac CTA and echocardiogram.  Follow-up with [place] if testing is abnormal.  Signed,  [name], MD  03/19/2021 2:17 PM    [facility]

## 2021-03-19 NOTE — Progress Notes (Signed)
Cardiology Office Note   Date:  03/19/2021   ID:  Susan Weeks, DOB 03-11-1951, MRN [id number]  PCP:  [name], MD  Cardiologist:   [name], MD   Chief Complaint  Patient presents with   Other    SOB c/o edema ankles. Meds reviewed verbally with pt.      History of Present Illness: Susan Weeks is a 70 y.o. female who was referred by Dr. [name] for evaluation of exertional dyspnea.  She has no prior cardiac history. She has known history of essential hypertension, hyperlipidemia, erythrocytosis thought to be due to untreated sleep apnea which improved after starting CPAP treatment and adenocarcinoma of the cecum status post partial colon resection. No prior cardiac testing.  She reports recent exertional dyspnea happening with moderate activities with increased leg edema.  This started few months ago.  She had 1 episode of chest fullness after a meal with no recurrent symptoms.  She denies tightness or heaviness with exertion.  She reports that her leg edema improved after she was started on small dose hydrochlorothiazide.  She quit smoking in 1978.  Family history is remarkable for TIA affecting her mother. No orthopnea or PND.  No palpitations or tachycardia.    Past Medical History:  Diagnosis Date   Abnormal liver function    Colon cancer ([place])    adenocarcinoma of the cecum s/p partial colon resection   History of colon polyps    Hyperlipidemia    Hypertension     Past Surgical History:  Procedure Laterality Date   ABDOMINAL HYSTERECTOMY  1984   secondary to bleeding and fibroid tumors   APPENDECTOMY     COLONOSCOPY W/ POLYPECTOMY  2001   COLONOSCOPY WITH PROPOFOL N/A 07/28/2016   Procedure: COLONOSCOPY WITH PROPOFOL;  Surgeon: [name], MD;  Location: [facility] ENDOSCOPY;  Service: Endoscopy;  Laterality: N/A;     Current Outpatient Medications  Medication Sig Dispense Refill   amLODipine (NORVASC) 5 MG tablet TAKE 1 TABLET BY MOUTH  EVERY DAY 90 tablet 1   aspirin EC 81 MG tablet Take 1 tablet (81 mg total) by mouth daily. 90 tablet 3   calcium carbonate (OS-CAL) 600 MG TABS Take 600 mg by mouth daily.      hydrochlorothiazide (MICROZIDE) 12.5 MG capsule Take 1 capsule (12.5 mg total) by mouth daily. 30 capsule 1   rosuvastatin (CRESTOR) 5 MG tablet TAKE 1 TABLET BY MOUTH EVERY DAY 90 tablet 3   vitamin E 400 UNIT capsule Take 400 Units by mouth daily.      No current facility-administered medications for this visit.    Allergies:   Lisinopril and Z-pak [azithromycin]    Social History:  The patient  reports that she quit smoking about 44 years ago. Her smoking use included cigarettes. She has never used smokeless tobacco. She reports that she does not drink alcohol and does not use drugs.   Family History:  The patient's family history includes Cancer in her father; Heart attack in her brother; Heart disease in her brother; Hypertension in her brother, mother, and sister; Stroke in her mother.    ROS:  Please see the history of present illness.   Otherwise, review of systems are positive for none.   All other systems are reviewed and negative.    PHYSICAL EXAM: VS:  BP 130/78 (BP Location: Right Arm, Patient Position: Sitting, Cuff Size: Normal)   Pulse 88   Ht 5\' 3"  (1.6 m)  Wt 187 lb 6 oz (85 kg)   LMP 06/28/1982   SpO2 98%   BMI 33.19 kg/m  , BMI Body mass index is 33.19 kg/m. GEN: Well nourished, well developed, in no acute distress  HEENT: normal  Neck: no JVD, carotid bruits, or masses Cardiac: RRR; no rubs, or gallops.  2 out of 6 systolic murmur in the aortic area.  Mild right leg edema Respiratory:  clear to auscultation bilaterally, normal work of breathing GI: soft, nontender, nondistended, + BS MS: no deformity or atrophy  Skin: warm and dry, no rash Neuro:  Strength and sensation are intact Psych: euthymic mood, full affect   EKG:  EKG is ordered today. The ekg ordered today  demonstrates normal sinus rhythm with no significant ST or T wave changes.  No evidence of infarct.   Recent Labs: 11/30/2020: ALT 34; BUN 13; Creatinine, Ser 0.82; Hemoglobin 15.5; Platelets 209; Potassium 3.9; Sodium 139; TSH 1.127    Lipid Panel    Component Value Date/Time   CHOL 147 11/30/2020 1145   TRIG 79 11/30/2020 1145   HDL 61 11/30/2020 1145   CHOLHDL 2.4 11/30/2020 1145   VLDL 16 11/30/2020 1145   LDLCALC 70 11/30/2020 1145   LDLDIRECT 150.8 03/02/2013 0800      Wt Readings from Last 3 Encounters:  03/19/21 187 lb 6 oz (85 kg)  03/12/21 187 lb (84.8 kg)  12/20/20 185 lb 9.6 oz (84.2 kg)       PAD Screen 03/19/2021  Previous PAD dx? No  Previous surgical procedure? Yes  Pain with walking? No  Feet/toe relief with dangling? No  Painful, non-healing ulcers? No  Extremities discolored? No      ASSESSMENT AND PLAN:  1.  Exertional dyspnea: Possible anginal equivalent given her risk factors and new onset.  Fortunately, her EKG does not show any ischemic changes.  I discussed different management options and recommend proceeding with cardiac CTA.  She is not allergic to contrast.  2.  Cardiac murmur: Seems to be suggestive of aortic sclerosis and possible mild stenosis.  Given her symptoms, I requested an echocardiogram.  3.  Essential hypertension: Blood pressure is reasonably controlled on current medications.  4.  Hyperlipidemia: Most recent lipid profile showed an LDL of 70.  She is currently on small dose rosuvastatin 5 mg daily.    Disposition: Proceed with cardiac CTA and echocardiogram.  Follow-up with [place] if testing is abnormal.  Signed,  [name], MD  03/19/2021 2:17 PM    [facility]

## 2021-03-20 LAB — BASIC METABOLIC PANEL
BUN/Creatinine Ratio: 19 (ref 12–28)
BUN: 18 mg/dL (ref 8–27)
CO2: 23 mmol/L (ref 20–29)
Calcium: 9.4 mg/dL (ref 8.7–10.3)
Chloride: 101 mmol/L (ref 96–106)
Creatinine, Ser: 0.96 mg/dL (ref 0.57–1.00)
Glucose: 118 mg/dL — ABNORMAL HIGH (ref 70–99)
Potassium: 3.8 mmol/L (ref 3.5–5.2)
Sodium: 146 mmol/L — ABNORMAL HIGH (ref 134–144)
eGFR: 63 mL/min/{1.73_m2} (ref >59)

## 2021-03-26 ENCOUNTER — Telehealth: Payer: Self-pay | Admitting: Pulmonary Disease

## 2021-03-26 NOTE — Telephone Encounter (Signed)
Per recall note, Susan Weeks left message to schedule appt.  Appt scheduled 05/21/2020 at 10:00. Patient is aware and voiced her understanding.  Nothing further needed at this time.

## 2021-03-29 ENCOUNTER — Telehealth (HOSPITAL_COMMUNITY): Payer: Self-pay | Admitting: Emergency Medicine

## 2021-03-29 NOTE — Telephone Encounter (Signed)
Reaching out to patient to offer assistance regarding upcoming cardiac imaging study; pt verbalizes understanding of appt date/time, parking situation and where to check in, pre-test NPO status and medications ordered, and verified current allergies; name and call back number provided for further questions should they arise Marchia Bond RN Navigator Cardiac Imaging [facility] [phone] office [phone] cell  Arrival 745 100mg  metoprolol tart R arm IV preferred

## 2021-04-01 ENCOUNTER — Ambulatory Visit
Admission: RE | Admit: 2021-04-01 | Discharge: 2021-04-01 | Disposition: A | Discharge status: Home / Self Care | Payer: BC Managed Care – PPO | Source: Ambulatory Visit | Attending: Cardiovascular Disease | Admitting: Cardiovascular Disease

## 2021-04-01 ENCOUNTER — Other Ambulatory Visit: Payer: Self-pay | Admitting: Cardiovascular Disease

## 2021-04-01 ENCOUNTER — Other Ambulatory Visit: Payer: Self-pay

## 2021-04-01 DIAGNOSIS — R931 Abnormal findings on diagnostic imaging of heart and coronary circulation: Secondary | ICD-10-CM | POA: Diagnosis not present

## 2021-04-01 DIAGNOSIS — I2510 Atherosclerotic heart disease of native coronary artery without angina pectoris: Secondary | ICD-10-CM | POA: Diagnosis not present

## 2021-04-01 DIAGNOSIS — R072 Precordial pain: Secondary | ICD-10-CM | POA: Insufficient documentation

## 2021-04-01 MED ORDER — IOHEXOL 350 MG/ML SOLN
100.0000 mL | Freq: Once | INTRAVENOUS | Status: AC | PRN
  Administered 2021-04-01: 09:00:00 100 mL via INTRAVENOUS

## 2021-04-01 MED ORDER — DILTIAZEM HCL 25 MG/5ML IV SOLN
10.0000 mg | Freq: Once | INTRAVENOUS | Status: AC
  Administered 2021-04-01: 09:00:00 10 mg via INTRAVENOUS

## 2021-04-01 MED ORDER — METOPROLOL TARTRATE 5 MG/5ML IV SOLN
10.0000 mg | Freq: Once | INTRAVENOUS | Status: AC
  Administered 2021-04-01: 09:00:00 10 mg via INTRAVENOUS

## 2021-04-01 MED ORDER — NITROGLYCERIN 0.4 MG SL SUBL
0.8000 mg | SUBLINGUAL_TABLET | Freq: Once | SUBLINGUAL | Status: AC
  Administered 2021-04-01: 09:00:00 0.8 mg via SUBLINGUAL

## 2021-04-01 NOTE — Progress Notes (Signed)
Patient tolerated procedure well. Ambulate w/o difficulty. Denies light headedness or being dizzy. Sitting in chair drinking water provided. Encouraged to drink extra water today and reasoning explained. Verbalized understanding. All questions answered. ABC intact. No further needs. Discharge from procedure area w/o issues.   °

## 2021-04-02 DIAGNOSIS — I2510 Atherosclerotic heart disease of native coronary artery without angina pectoris: Secondary | ICD-10-CM | POA: Diagnosis not present

## 2021-04-03 ENCOUNTER — Other Ambulatory Visit: Payer: Self-pay | Admitting: Internal Medicine

## 2021-04-04 ENCOUNTER — Telehealth: Payer: Self-pay | Admitting: Cardiovascular Disease

## 2021-04-04 DIAGNOSIS — R931 Abnormal findings on diagnostic imaging of heart and coronary circulation: Secondary | ICD-10-CM

## 2021-04-04 NOTE — Telephone Encounter (Signed)
[facility] radiology calling to discuss recent ct  testing results   Please call

## 2021-04-04 NOTE — Telephone Encounter (Signed)
Radiologist over read for the patients Cardiac CTA is viewable in [place]. Msg fwd to Dr. [name] to review.

## 2021-04-04 NOTE — Telephone Encounter (Signed)
I called the patient to go over the CTA results but there was no answer.  I did leave her a message about abnormal CTA showing significant stenosis in the LAD.  I have recommended proceeding with left heart catheterization.  In addition, she does have some extracardiac abnormalities as reported by radiology: I included Dr. [name] on this message to make her aware.  Two hypervascular lesions in the liver, one of which may have enlarged slightly since previous PET imaging. Based on enhancement on this and previous imaging would favor process such as focal nodular hyperplasia. Consider 3 to six-month follow-up with abdominal MRI utilizing Eovist to differentiate from adenoma. Enhancement characteristics are not compatible with hemangioma.

## 2021-04-05 ENCOUNTER — Other Ambulatory Visit: Payer: Self-pay

## 2021-04-05 ENCOUNTER — Other Ambulatory Visit (INDEPENDENT_AMBULATORY_CARE_PROVIDER_SITE_OTHER): Payer: BC Managed Care – PPO

## 2021-04-05 DIAGNOSIS — R931 Abnormal findings on diagnostic imaging of heart and coronary circulation: Secondary | ICD-10-CM

## 2021-04-05 NOTE — Telephone Encounter (Addendum)
Spoke with the patient. Patient has had the opportunity to listen to  Dr. Tyrell Antonio voicemail message. Patient is agreeable with proceeding with the recommended cardiac cath. Reviewed with the patient what to expect that day. Patient given verbal pre-procedure instructions.  Patient will come to our office today @ 11:30 am for pre-procedure labs (bmp, cbc).  Patient aware that the cardiac cath is scheduled on 04/15/21 @ 11:45 am with Dr. [name]. Adv the patient to arrive at the medical mall at 10:30 am (per Marcie Bal they may be able to start the procedure a little early).  Written pre-procedure  instructions have been left at the front desk to be picked up today when the patient come in for labs. After reviewing patient has been advised to call the office if any questions. Patient verbalized  understanding and voiced appreciation for the call.   Patient will need a 4 week f/u. Patient will stop at check out today after her lab draw.

## 2021-04-05 NOTE — Telephone Encounter (Signed)
On review the patients cardiac cath has been scheduled on the incorrect day  Mon 04/08/21. Patients cath should be scheduled on Mon 04/15/21 with Dr. [name].  Called cath scheduling and left a detailed message for them to reschedule the patients cath. Left my contact number if they need to discuss.

## 2021-04-05 NOTE — Telephone Encounter (Signed)
Reviewed.  Has had previous w/up for liver lesions. Contacted Dr Tasia Catchings regarding further testing and f/u.

## 2021-04-05 NOTE — Telephone Encounter (Addendum)
Patients cardiac cath date and time have been corrected. Patients cath is rescheduled for 04/15/21 @ 10:30 am with a 9:30 am arrival time. Called the patient and made her aware. Patient verbalized understanding and voiced appreciation for the call.

## 2021-04-06 ENCOUNTER — Telehealth: Payer: Self-pay | Admitting: Internal Medicine

## 2021-04-06 LAB — BASIC METABOLIC PANEL
BUN/Creatinine Ratio: 14 (ref 12–28)
BUN: 13 mg/dL (ref 8–27)
CO2: 24 mmol/L (ref 20–29)
Calcium: 10.1 mg/dL (ref 8.7–10.3)
Chloride: 100 mmol/L (ref 96–106)
Creatinine, Ser: 0.95 mg/dL (ref 0.57–1.00)
Glucose: 102 mg/dL — ABNORMAL HIGH (ref 70–99)
Potassium: 3.9 mmol/L (ref 3.5–5.2)
Sodium: 139 mmol/L (ref 134–144)
eGFR: 64 mL/min/{1.73_m2} (ref >59)

## 2021-04-06 LAB — CBC WITH DIFFERENTIAL/PLATELET
Basophils Absolute: 0 10*3/uL (ref 0.0–0.2)
Basos: 1 %
EOS (ABSOLUTE): 0.1 10*3/uL (ref 0.0–0.4)
Eos: 2 %
Hematocrit: 48.9 % — ABNORMAL HIGH (ref 34.0–46.6)
Hemoglobin: 16.5 g/dL — ABNORMAL HIGH (ref 11.1–15.9)
Immature Grans (Abs): 0 10*3/uL (ref 0.0–0.1)
Immature Granulocytes: 0 %
Lymphocytes Absolute: 1.8 10*3/uL (ref 0.7–3.1)
Lymphs: 41 %
MCH: 29.4 pg (ref 26.6–33.0)
MCHC: 33.7 g/dL (ref 31.5–35.7)
MCV: 87 fL (ref 79–97)
Monocytes Absolute: 0.3 10*3/uL (ref 0.1–0.9)
Monocytes: 7 %
Neutrophils Absolute: 2.2 10*3/uL (ref 1.4–7.0)
Neutrophils: 49 %
Platelets: 260 10*3/uL (ref 150–450)
RBC: 5.62 x10E6/uL — ABNORMAL HIGH (ref 3.77–5.28)
RDW: 13.1 % (ref 11.7–15.4)
WBC: 4.4 10*3/uL (ref 3.4–10.8)

## 2021-04-06 NOTE — Telephone Encounter (Signed)
-----   Message from Earlie Server, MD sent at 04/06/2021  2:26 PM EST ----- Regarding: RE: follow up liver lesions Thank you. She has appt with me in Feb 2023, I will order MRI abdomen utilizing Eovist to follow up the lesion.   ----- Message ----- From: [name], MD Sent: 04/05/2021  12:55 AM EST To: Earlie Server, MD Subject: follow up liver lesions                        You have seen Ms Montanaro previously for follow up:  history of colon cancer, erythrocytosis and f/u liver lesion.  She recently had CTA ordered by cardiology.  Finding on CTA:  "Two hypervascular lesions in the liver, one of which may have enlarged slightly since previous PET imaging. Based on enhancement on this and previous imaging would favor process such as focal nodular hyperplasia. Consider 3 to six-month follow-up with abdominal MRI utilizing Eovist to differentiate from adenoma. Enhancement characteristics are not compatible with hemangioma".  Per review, she has had extensive work up.  I wanted to get your opinion regarding need for further imaging/work up.  Thank you for your help.  I really appreciate it.   [name]

## 2021-04-08 ENCOUNTER — Telehealth: Payer: Self-pay

## 2021-04-08 DIAGNOSIS — K769 Liver disease, unspecified: Secondary | ICD-10-CM

## 2021-04-08 NOTE — Telephone Encounter (Signed)
-----   Message from Earlie Server, MD sent at 04/06/2021  2:26 PM EST ----- Regarding: FW: follow up liver lesions Please obtain MRI abdomen Eovistin Feb, a few days prior to her visit.  Please call radiology to make sure we have the right order.  We have previously done [place] with eovist see 12/14/20 image. This time radiologist recommend MRI with this eovist thanks.     ----- Message ----- From: [name], MD Sent: 04/05/2021  12:55 AM EST To: Earlie Server, MD Subject: follow up liver lesions                        You have seen Susan Weeks previously for follow up:  history of colon cancer, erythrocytosis and f/u liver lesion.  She recently had CTA ordered by cardiology.  Finding on CTA:  "Two hypervascular lesions in the liver, one of which may have enlarged slightly since previous PET imaging. Based on enhancement on this and previous imaging would favor process such as focal nodular hyperplasia. Consider 3 to six-month follow-up with abdominal MRI utilizing Eovist to differentiate from adenoma. Enhancement characteristics are not compatible with hemangioma".  Per review, she has had extensive work up.  I wanted to get your opinion regarding need for further imaging/work up.  Thank you for your help.  I really appreciate it.   [name]

## 2021-04-08 NOTE — Telephone Encounter (Signed)
Please schedule MRI in Feb, a few days prior to scheduled appt.   Order added, EOVIST added to MRI order comments per radiology.

## 2021-04-15 ENCOUNTER — Encounter
Admission: RE | Disposition: A | Discharge status: Home / Self Care | Payer: Self-pay | Source: Home / Self Care | Attending: Cardiovascular Disease

## 2021-04-15 ENCOUNTER — Other Ambulatory Visit: Payer: Self-pay

## 2021-04-15 ENCOUNTER — Ambulatory Visit
Admission: RE | Admit: 2021-04-15 | Discharge: 2021-04-15 | Disposition: A | Discharge status: Home / Self Care | Payer: BC Managed Care – PPO | Attending: Cardiovascular Disease | Admitting: Cardiovascular Disease

## 2021-04-15 ENCOUNTER — Telehealth: Payer: Self-pay | Admitting: Cardiovascular Disease

## 2021-04-15 DIAGNOSIS — I25119 Atherosclerotic heart disease of native coronary artery with unspecified angina pectoris: Secondary | ICD-10-CM

## 2021-04-15 DIAGNOSIS — E785 Hyperlipidemia, unspecified: Secondary | ICD-10-CM | POA: Insufficient documentation

## 2021-04-15 DIAGNOSIS — I10 Essential (primary) hypertension: Secondary | ICD-10-CM | POA: Insufficient documentation

## 2021-04-15 DIAGNOSIS — R011 Cardiac murmur, unspecified: Secondary | ICD-10-CM | POA: Diagnosis not present

## 2021-04-15 DIAGNOSIS — I25118 Atherosclerotic heart disease of native coronary artery with other forms of angina pectoris: Secondary | ICD-10-CM

## 2021-04-15 DIAGNOSIS — I208 Other forms of angina pectoris: Secondary | ICD-10-CM

## 2021-04-15 DIAGNOSIS — Z79899 Other long term (current) drug therapy: Secondary | ICD-10-CM | POA: Diagnosis not present

## 2021-04-15 DIAGNOSIS — R0609 Other forms of dyspnea: Secondary | ICD-10-CM | POA: Insufficient documentation

## 2021-04-15 DIAGNOSIS — I2510 Atherosclerotic heart disease of native coronary artery without angina pectoris: Secondary | ICD-10-CM | POA: Insufficient documentation

## 2021-04-15 DIAGNOSIS — I2582 Chronic total occlusion of coronary artery: Secondary | ICD-10-CM | POA: Diagnosis not present

## 2021-04-15 DIAGNOSIS — R9389 Abnormal findings on diagnostic imaging of other specified body structures: Secondary | ICD-10-CM

## 2021-04-15 HISTORY — PX: LEFT HEART CATH AND CORONARY ANGIOGRAPHY: CATH118249

## 2021-04-15 HISTORY — PX: CORONARY STENT INTERVENTION: CATH118234

## 2021-04-15 LAB — POCT ACTIVATED CLOTTING TIME: Activated Clotting Time: 323 seconds

## 2021-04-15 SURGERY — LEFT HEART CATH AND CORONARY ANGIOGRAPHY
Anesthesia: Moderate Sedation

## 2021-04-15 MED ORDER — SODIUM CHLORIDE 0.9 % IV SOLN: 250.0000 mL | INTRAVENOUS | Status: DC | PRN

## 2021-04-15 MED ORDER — IOHEXOL 300 MG/ML  SOLN
INTRAMUSCULAR | Status: DC | PRN
  Administered 2021-04-15: 135 mL

## 2021-04-15 MED ORDER — MIDAZOLAM HCL 2 MG/2ML IJ SOLN
INTRAMUSCULAR | Status: AC
  Filled 2021-04-15: qty 2

## 2021-04-15 MED ORDER — HEPARIN SODIUM (PORCINE) 1000 UNIT/ML IJ SOLN
INTRAMUSCULAR | Status: DC | PRN
  Administered 2021-04-15 (×2): 4000 [IU] via INTRAVENOUS

## 2021-04-15 MED ORDER — HEPARIN SODIUM (PORCINE) 1000 UNIT/ML IJ SOLN
INTRAMUSCULAR | Status: AC
  Filled 2021-04-15: qty 10

## 2021-04-15 MED ORDER — LIDOCAINE HCL 1 % IJ SOLN
INTRAMUSCULAR | Status: AC
  Filled 2021-04-15: qty 20

## 2021-04-15 MED ORDER — VERAPAMIL HCL 2.5 MG/ML IV SOLN
INTRAVENOUS | Status: AC
  Filled 2021-04-15: qty 2

## 2021-04-15 MED ORDER — SODIUM CHLORIDE 0.9% FLUSH: 3.0000 mL | Freq: Two times a day (BID) | INTRAVENOUS | Status: DC

## 2021-04-15 MED ORDER — LABETALOL HCL 5 MG/ML IV SOLN: 10.0000 mg | INTRAVENOUS | Status: DC | PRN

## 2021-04-15 MED ORDER — METOPROLOL TARTRATE 25 MG PO TABS: 25.0000 mg | ORAL_TABLET | Freq: Two times a day (BID) | ORAL | 11 refills | Status: DC

## 2021-04-15 MED ORDER — ACETAMINOPHEN 325 MG PO TABS: 650.0000 mg | ORAL_TABLET | ORAL | Status: DC | PRN

## 2021-04-15 MED ORDER — HEPARIN (PORCINE) IN NACL 1000-0.9 UT/500ML-% IV SOLN
INTRAVENOUS | Status: DC | PRN
  Administered 2021-04-15: 1000 mL

## 2021-04-15 MED ORDER — SODIUM CHLORIDE 0.9 % WEIGHT BASED INFUSION
1.0000 mL/kg/h | INTRAVENOUS | Status: DC
  Administered 2021-04-15: 1 mL/kg/h via INTRAVENOUS

## 2021-04-15 MED ORDER — SODIUM CHLORIDE 0.9% FLUSH: 3.0000 mL | INTRAVENOUS | Status: DC | PRN

## 2021-04-15 MED ORDER — VERAPAMIL HCL 2.5 MG/ML IV SOLN
INTRAVENOUS | Status: DC | PRN
  Administered 2021-04-15: 2.5 mg via INTRA_ARTERIAL

## 2021-04-15 MED ORDER — LIDOCAINE HCL (PF) 1 % IJ SOLN
INTRAMUSCULAR | Status: DC | PRN
  Administered 2021-04-15: 2 mL

## 2021-04-15 MED ORDER — MIDAZOLAM HCL 2 MG/2ML IJ SOLN
INTRAMUSCULAR | Status: DC | PRN
  Administered 2021-04-15: 1 mg via INTRAVENOUS

## 2021-04-15 MED ORDER — ONDANSETRON HCL 4 MG/2ML IJ SOLN: 4.0000 mg | Freq: Four times a day (QID) | INTRAMUSCULAR | Status: DC | PRN

## 2021-04-15 MED ORDER — SODIUM CHLORIDE 0.9 % WEIGHT BASED INFUSION
3.0000 mL/kg/h | INTRAVENOUS | Status: AC
  Administered 2021-04-15: 3 mL/kg/h via INTRAVENOUS

## 2021-04-15 MED ORDER — HEPARIN (PORCINE) IN NACL 1000-0.9 UT/500ML-% IV SOLN
INTRAVENOUS | Status: AC
  Filled 2021-04-15: qty 1000

## 2021-04-15 MED ORDER — SODIUM CHLORIDE 0.9 % IV SOLN: INTRAVENOUS | Status: DC

## 2021-04-15 MED ORDER — FENTANYL CITRATE (PF) 100 MCG/2ML IJ SOLN
INTRAMUSCULAR | Status: AC
  Filled 2021-04-15: qty 2

## 2021-04-15 MED ORDER — FENTANYL CITRATE (PF) 100 MCG/2ML IJ SOLN
INTRAMUSCULAR | Status: DC | PRN
  Administered 2021-04-15: 25 ug via INTRAVENOUS

## 2021-04-15 MED ORDER — ASPIRIN 81 MG PO CHEW: 81.0000 mg | CHEWABLE_TABLET | ORAL | Status: DC

## 2021-04-15 MED ORDER — NITROGLYCERIN 1 MG/10 ML FOR IR/CATH LAB
INTRA_ARTERIAL | Status: AC
  Filled 2021-04-15: qty 10

## 2021-04-15 SURGICAL SUPPLY — 19 items
BALLN TREK OTW 2X8 (BALLOONS) ×2
BALLN TREK RX 2.5X12 (BALLOONS) ×2
BALLOON TREK OTW 2X8 (BALLOONS) IMPLANT
BALLOON TREK RX 2.5X12 (BALLOONS) IMPLANT
CATH 5FR JL3.5 JR4 ANG PIG MP (CATHETERS) ×1 IMPLANT
CATH INFINITI 5FR JK (CATHETERS) ×1 IMPLANT
CATH LAUNCHER 6FR EBU3.5 (CATHETERS) ×1 IMPLANT
DEVICE RAD TR BAND REGULAR (VASCULAR PRODUCTS) ×1 IMPLANT
GLIDESHEATH SLEND SS 6F .021 (SHEATH) ×1 IMPLANT
GUIDEWIRE INQWIRE 1.5J.035X260 (WIRE) IMPLANT
INQWIRE 1.5J .035X260CM (WIRE) ×2
KIT ENCORE 26 ADVANTAGE (KITS) ×1 IMPLANT
PACK CARDIAC CATH (CUSTOM PROCEDURE TRAY) ×2 IMPLANT
PROTECTION STATION PRESSURIZED (MISCELLANEOUS) ×2
SET ATX SIMPLICITY (MISCELLANEOUS) ×1 IMPLANT
STATION PROTECTION PRESSURIZED (MISCELLANEOUS) IMPLANT
TUBING CIL FLEX 10 FLL-RA (TUBING) ×1 IMPLANT
WIRE ASAHI FIELDER XT 300CM (WIRE) ×1 IMPLANT
WIRE RUNTHROUGH .014X180CM (WIRE) ×1 IMPLANT

## 2021-04-15 NOTE — Telephone Encounter (Signed)
Returned the call to [place]. Lmtcb.

## 2021-04-15 NOTE — Telephone Encounter (Signed)
Cath lab called stating patient arrived an hour early to appt could not locate AVS for instructions, please call Juliann Pulse to assist [phone]. Stated she would file a complaint

## 2021-04-15 NOTE — Telephone Encounter (Signed)
Susan Weeks that I called the patient on 04/05/21 and advised the patient that her Cardiac Cath with Dr. [name] was confirmed for 04/15/21 @ 10:30 am with a 9:30 am arrival time. Patient verbalized understanding at that time and stated that she would also write it down as a reminder.

## 2021-04-15 NOTE — Interval H&P Note (Signed)
Cath Lab Visit (complete for each Cath Lab visit)  Clinical Evaluation Leading to the Procedure:   ACS: No.  Non-ACS:    Anginal Classification: CCS III  Anti-ischemic medical therapy: Minimal Therapy (1 class of medications)  Non-Invasive Test Results: High-risk stress test findings: cardiac mortality >3%/year  Prior CABG: No previous CABG      History and Physical Interval Note:  04/15/2021 10:41 AM  Susan Weeks  has presented today for surgery, with the diagnosis of LT Heart Cath   Abnormal CTA.  The various methods of treatment have been discussed with the patient and family. After consideration of risks, benefits and other options for treatment, the patient has consented to  Procedure(s): LEFT HEART CATH AND CORONARY ANGIOGRAPHY (N/A) as a surgical intervention.  The patient's history has been reviewed, patient examined, no change in status, stable for surgery.  I have reviewed the patient's chart and labs.  Questions were answered to the patient's satisfaction.     Susan Weeks

## 2021-04-16 ENCOUNTER — Encounter: Payer: Self-pay | Admitting: Cardiovascular Disease

## 2021-04-17 ENCOUNTER — Telehealth: Payer: Self-pay | Admitting: Cardiovascular Disease

## 2021-04-17 NOTE — Telephone Encounter (Signed)
Patient states that her cath site, under the tape, looks blistered. Please call to discuss.

## 2021-04-17 NOTE — Telephone Encounter (Signed)
Spoke with the patient. Patient sts that she thinks she is having some mild skin irritation from the bandage place at her cath site.  Patients cath was on 04/15/21, cath site (right radial). Adv the patient that it would be ok to go ahead and remove the bandage.   Adv the patient to clean the site with warm soapy water, pat dry, and place a regular band aid over the site. Adv the patient to continue to keep the site clean and dry and to monitor for any signs of infection.  Patient verbalized understanding and voiced appreciation for the call back.

## 2021-04-19 ENCOUNTER — Ambulatory Visit (INDEPENDENT_AMBULATORY_CARE_PROVIDER_SITE_OTHER): Payer: BC Managed Care – PPO

## 2021-04-19 ENCOUNTER — Other Ambulatory Visit: Payer: Self-pay

## 2021-04-19 DIAGNOSIS — R011 Cardiac murmur, unspecified: Secondary | ICD-10-CM | POA: Diagnosis not present

## 2021-04-19 LAB — ECHOCARDIOGRAM COMPLETE
AR max vel: 2.27 cm2
AV Area VTI: 2.43 cm2
AV Area mean vel: 2.26 cm2
AV Mean grad: 4 mmHg
AV Peak grad: 7.4 mmHg
Ao pk vel: 1.36 m/s
Area-P 1/2: 3.77 cm2
S' Lateral: 2.6 cm

## 2021-04-19 MED ORDER — PERFLUTREN LIPID MICROSPHERE
1.0000 mL | INTRAVENOUS | Status: AC | PRN
  Administered 2021-04-19: 2 mL via INTRAVENOUS

## 2021-04-22 ENCOUNTER — Telehealth: Payer: Self-pay | Admitting: *Deleted

## 2021-04-22 NOTE — Telephone Encounter (Signed)
Reviewed results and recommendations with patient and she verbalized understanding with no further questions at this time.  

## 2021-04-22 NOTE — Telephone Encounter (Signed)
Patient is returning your call.  

## 2021-04-22 NOTE — Telephone Encounter (Signed)
-----   Message from [name], MD sent at 04/19/2021  3:46 PM EST ----- Inform patient that echo showed normal LV systolic function with mild to moderate mitral regurgitation and no other significant abnormalities. She is scheduled to see Dr. Kipp Brood for consultation regarding CABG .

## 2021-04-22 NOTE — Telephone Encounter (Signed)
Left voicemail message to call back for review of results.  

## 2021-04-24 NOTE — H&P (View-Only) (Signed)
[address]       ,[place] [address]             [id number]        [name] [place] Medical Record #[id number] Date of Birth: 1951/02/23  Referring: [name], MD Primary Care: [name], MD Primary Cardiologist:None  Chief Complaint:    Chief Complaint  Patient presents with   Coronary Artery Disease    Surgical consult, cath 12/12, ECHO 12/16    History of Present Illness:     Susan Weeks is a 70 y.o. female who was referred by Dr. [name] for evaluation of exertional dyspnea.  This all began over the summer.  Most notably she had symptoms when she was ambulating a few weeks ago with her sister.  She subsequently was seen by Dr. [name] where she underwent a left heart catheterization which showed three-vessel disease.  She has no prior cardiac history. She has known history of essential hypertension, hyperlipidemia, erythrocytosis thought to be due to untreated sleep apnea which improved after starting CPAP treatment and adenocarcinoma of the cecum status post partial colon resection.   Past Medical and Surgical History: Previous Chest Surgery: no Previous Chest Radiation: no Diabetes Mellitus: no.  HbA1C 5.9 Creatinine: 0.95  Past Medical History:  Diagnosis Date   Abnormal liver function    Colon cancer ([place])    adenocarcinoma of the cecum s/p partial colon resection   History of colon polyps    Hyperlipidemia    Hypertension     Past Surgical History:  Procedure Laterality Date   ABDOMINAL HYSTERECTOMY  1984   secondary to bleeding and fibroid tumors   APPENDECTOMY     COLONOSCOPY W/ POLYPECTOMY  2001   COLONOSCOPY WITH PROPOFOL N/A 07/28/2016   Procedure: COLONOSCOPY WITH PROPOFOL;  Surgeon: [name], MD;  Location: [facility] ENDOSCOPY;  Service: Endoscopy;  Laterality: N/A;   CORONARY STENT INTERVENTION N/A 04/15/2021   Procedure: CORONARY STENT INTERVENTION;  Surgeon: [name], MD;  Location: [facility] CV LAB;  Service: Cardiovascular;  Laterality: N/A;   LEFT HEART CATH AND CORONARY ANGIOGRAPHY N/A 04/15/2021   Procedure: LEFT HEART CATH AND CORONARY ANGIOGRAPHY;  Surgeon: [name], MD;  Location: [facility] CV LAB;  Service: Cardiovascular;  Laterality: N/A;    Social History: Support: lives with husband and son  Social History   Tobacco Use  Smoking Status Former   Types: Cigarettes   Quit date: 1978   Years since quitting: 45.0  Smokeless Tobacco Never    Social History   Substance and Sexual Activity  Alcohol Use No   Alcohol/week: 0.0 standard drinks     Allergies  Allergen Reactions   Lisinopril Rash    Developed slight facial swelling and itching over forehead while taking. Unclear if definite ACE allergy, but concern enough to stop in ER today.   Z-Pak [Azithromycin] Nausea And Vomiting    Caused Emesis     Current Outpatient Medications  Medication Sig Dispense Refill   acetaminophen (TYLENOL) 500 MG tablet Take 500 mg by mouth every 6 (six) hours as needed for moderate pain.     amLODipine (NORVASC) 5 MG tablet TAKE 1 TABLET BY MOUTH EVERY DAY 90 tablet 1   aspirin EC 81 MG tablet Take 1 tablet (81 mg total) by mouth daily. 90 tablet 3   Calcium Carbonate-Vitamin D (CALCIUM 600+D PO) Take 1 tablet by mouth daily.  hydrochlorothiazide (MICROZIDE) 12.5 MG capsule TAKE 1 CAPSULE BY MOUTH EVERY DAY 30 capsule 1   Menthol-Methyl Salicylate (SALONPAS PAIN RELIEF PATCH) PTCH Apply 1 patch topically daily as needed (pain).     metoprolol tartrate (LOPRESSOR) 25 MG tablet Take 1 tablet (25 mg total) by mouth 2 (two) times daily. 60 tablet 11   rosuvastatin (CRESTOR) 5 MG tablet TAKE 1 TABLET BY MOUTH EVERY DAY 90 tablet 3   vitamin E 400 UNIT capsule Take 400 Units by mouth daily.      No current facility-administered medications for this visit.    (Not in a hospital admission)   Family History  Problem Relation Age of Onset   Stroke  Mother    Hypertension Mother    Cancer Father        Prostate    Hypertension Sister        x4   Heart attack Brother    Heart disease Brother        myocardial infarction   Hypertension Brother    Breast cancer Neg Hx      Review of Systems:   Review of Systems  Constitutional:  Positive for malaise/fatigue.  Respiratory:  Positive for shortness of breath.   Cardiovascular:  Positive for leg swelling. Negative for chest pain.     Physical Exam: BP (!) 142/83 (BP Location: Left Arm, Patient Position: Sitting)    Pulse 71    Resp 20    Ht 5\' 3"  (1.6 m)    Wt 180 lb (81.6 kg)    LMP 06/28/1982    SpO2 97% Comment: RA   BMI 31.89 kg/m  Physical Exam Constitutional:      General: She is not in acute distress.    Appearance: She is normal weight. She is not ill-appearing.  Cardiovascular:     Rate and Rhythm: Normal rate.  Pulmonary:     Effort: Pulmonary effort is normal. No respiratory distress.  Abdominal:     General: Abdomen is flat. There is no distension.  Musculoskeletal:        General: Normal range of motion.     Cervical back: Normal range of motion.  Neurological:     General: No focal deficit present.     Mental Status: She is alert and oriented to person, place, and time.      Diagnostic Studies & Laboratory data:    Left Heart Catherization: Intervention   Echo: IMPRESSIONS     1. Left ventricular ejection fraction, by estimation, is 60 to 65%. The  left ventricle has normal function. The left ventricle has no regional  wall motion abnormalities. Left ventricular diastolic parameters are  consistent with Grade I diastolic  dysfunction (impaired relaxation).   2. Right ventricular systolic function is normal. The right ventricular  size is normal. There is normal pulmonary artery systolic pressure. The  estimated right ventricular systolic pressure is 74.9 mmHg.   3. The mitral valve is normal in structure. Mild to moderate mitral valve   regurgitation. No evidence of mitral stenosis.   4. The aortic valve was not well visualized. Aortic valve regurgitation  is not visualized. Aortic valve sclerosis is present, with no evidence of  aortic valve stenosis. Aortic valve mean gradient measures 4.0 mmHg.   5. The inferior vena cava is normal in size with greater than 50%  respiratory variability, suggesting right atrial pressure of 3 mmHg.   EKG: Sinus I have independently reviewed the above radiologic studies and discussed  with the patient   Recent Lab Findings: Lab Results  Component Value Date   WBC 4.4 04/05/2021   HGB 16.5 (H) 04/05/2021   HCT 48.9 (H) 04/05/2021   PLT 260 04/05/2021   GLUCOSE 102 (H) 04/05/2021   CHOL 147 11/30/2020   TRIG 79 11/30/2020   HDL 61 11/30/2020   LDLDIRECT 150.8 03/02/2013   LDLCALC 70 11/30/2020   ALT 34 11/30/2020   AST 28 11/30/2020   NA 139 04/05/2021   K 3.9 04/05/2021   CL 100 04/05/2021   CREATININE 0.95 04/05/2021   BUN 13 04/05/2021   CO2 24 04/05/2021   TSH 1.127 11/30/2020   INR 1.0 05/02/2020   HGBA1C 5.9 11/07/2020      Assessment / Plan:   70 year old female with severe three-vessel coronary artery disease.  I personally reviewed her left heart cath.  The LAD fills from right to left collaterals.  The circumflex does not have much disease however posterior lateral branch, and the left posterior atrioventricular branch fill from collaterals.  There is also a 90% stenosis in the RCA.  Otherwise the right-sided vessels free from much disease.  I also reviewed the echocardiogram.  She has preserved biventricular function.  She does have mild to moderate mitral valve regurgitation but the jet is mostly central.  We discussed the risks and benefits of surgical revascularization and she is agreeable to proceed.  We will plan for CABG x4 on May 07, 2021.  If she has any severe symptoms that do not resolve then she was instructed to come to the emergency department for  emergent evaluation.     I  spent 40 minutes counseling the patient face to face.   Lajuana Matte 04/26/2021 9:14 AM

## 2021-04-24 NOTE — Progress Notes (Signed)
[address]       [place] [address]             [phone]        Susan Weeks [facility] Medical Record #[id number] Date of Birth: Sep 29, 1950  Referring: [name], MD Primary Care: [name], MD Primary Cardiologist:None  Chief Complaint:    Chief Complaint  Patient presents with   Coronary Artery Disease    Surgical consult, cath 12/12, ECHO 12/16    History of Present Illness:     Susan Weeks is a 70 y.o. female who was referred by Dr. [name] for evaluation of exertional dyspnea.  This all began over the summer.  Most notably she had symptoms when she was ambulating a few weeks ago with her sister.  She subsequently was seen by Dr. [name] where she underwent a left heart catheterization which showed three-vessel disease.  She has no prior cardiac history. She has known history of essential hypertension, hyperlipidemia, erythrocytosis thought to be due to untreated sleep apnea which improved after starting CPAP treatment and adenocarcinoma of the cecum status post partial colon resection.   Past Medical and Surgical History: Previous Chest Surgery: no Previous Chest Radiation: no Diabetes Mellitus: no.  HbA1C 5.9 Creatinine: 0.95  Past Medical History:  Diagnosis Date   Abnormal liver function    Colon cancer ([place])    adenocarcinoma of the cecum s/p partial colon resection   History of colon polyps    Hyperlipidemia    Hypertension     Past Surgical History:  Procedure Laterality Date   ABDOMINAL HYSTERECTOMY  1984   secondary to bleeding and fibroid tumors   APPENDECTOMY     COLONOSCOPY W/ POLYPECTOMY  2001   COLONOSCOPY WITH PROPOFOL N/A 07/28/2016   Procedure: COLONOSCOPY WITH PROPOFOL;  Surgeon: [name], MD;  Location: [facility] ENDOSCOPY;  Service: Endoscopy;  Laterality: N/A;   CORONARY STENT INTERVENTION N/A 04/15/2021   Procedure: CORONARY STENT INTERVENTION;  Surgeon: [name], MD;  Location: [facility] CV LAB;  Service: Cardiovascular;  Laterality: N/A;   LEFT HEART CATH AND CORONARY ANGIOGRAPHY N/A 04/15/2021   Procedure: LEFT HEART CATH AND CORONARY ANGIOGRAPHY;  Surgeon: [name], MD;  Location: [facility];  Service: Cardiovascular;  Laterality: N/A;    Social History: Support: lives with husband and son  Social History   Tobacco Use  Smoking Status Former   Types: Cigarettes   Quit date: 1978   Years since quitting: 45.0  Smokeless Tobacco Never    Social History   Substance and Sexual Activity  Alcohol Use No   Alcohol/week: 0.0 standard drinks     Allergies  Allergen Reactions   Lisinopril Rash    Developed slight facial swelling and itching over forehead while taking. Unclear if definite ACE allergy, but concern enough to stop in ER today.   Z-Pak [Azithromycin] Nausea And Vomiting    Caused Emesis     Current Outpatient Medications  Medication Sig Dispense Refill   acetaminophen (TYLENOL) 500 MG tablet Take 500 mg by mouth every 6 (six) hours as needed for moderate pain.     amLODipine (NORVASC) 5 MG tablet TAKE 1 TABLET BY MOUTH EVERY DAY 90 tablet 1   aspirin EC 81 MG tablet Take 1 tablet (81 mg total) by mouth daily. 90 tablet 3   Calcium Carbonate-Vitamin D (CALCIUM 600+D PO) Take 1 tablet by mouth daily.  hydrochlorothiazide (MICROZIDE) 12.5 MG capsule TAKE 1 CAPSULE BY MOUTH EVERY DAY 30 capsule 1   Menthol-Methyl Salicylate (SALONPAS PAIN RELIEF PATCH) PTCH Apply 1 patch topically daily as needed (pain).     metoprolol tartrate (LOPRESSOR) 25 MG tablet Take 1 tablet (25 mg total) by mouth 2 (two) times daily. 60 tablet 11   rosuvastatin (CRESTOR) 5 MG tablet TAKE 1 TABLET BY MOUTH EVERY DAY 90 tablet 3   vitamin E 400 UNIT capsule Take 400 Units by mouth daily.      No current facility-administered medications for this visit.    (Not in a hospital admission)   Family History  Problem Relation Age of Onset   Stroke  Mother    Hypertension Mother    Cancer Father        Prostate    Hypertension Sister        x4   Heart attack Brother    Heart disease Brother        myocardial infarction   Hypertension Brother    Breast cancer Neg Hx      Review of Systems:   Review of Systems  Constitutional:  Positive for malaise/fatigue.  Respiratory:  Positive for shortness of breath.   Cardiovascular:  Positive for leg swelling. Negative for chest pain.     Physical Exam: BP (!) 142/83 (BP Location: Left Arm, Patient Position: Sitting)    Pulse 71    Resp 20    Ht 5\' 3"  (1.6 m)    Wt 180 lb (81.6 kg)    LMP 06/28/1982    SpO2 97% Comment: RA   BMI 31.89 kg/m  Physical Exam Constitutional:      General: She is not in acute distress.    Appearance: She is normal weight. She is not ill-appearing.  Cardiovascular:     Rate and Rhythm: Normal rate.  Pulmonary:     Effort: Pulmonary effort is normal. No respiratory distress.  Abdominal:     General: Abdomen is flat. There is no distension.  Musculoskeletal:        General: Normal range of motion.     Cervical back: Normal range of motion.  Neurological:     General: No focal deficit present.     Mental Status: She is alert and oriented to person, place, and time.      Diagnostic Studies & Laboratory data:    Left Heart Catherization: Intervention   Echo: IMPRESSIONS     1. Left ventricular ejection fraction, by estimation, is 60 to 65%. The  left ventricle has normal function. The left ventricle has no regional  wall motion abnormalities. Left ventricular diastolic parameters are  consistent with Grade I diastolic  dysfunction (impaired relaxation).   2. Right ventricular systolic function is normal. The right ventricular  size is normal. There is normal pulmonary artery systolic pressure. The  estimated right ventricular systolic pressure is 61.9 mmHg.   3. The mitral valve is normal in structure. Mild to moderate mitral valve   regurgitation. No evidence of mitral stenosis.   4. The aortic valve was not well visualized. Aortic valve regurgitation  is not visualized. Aortic valve sclerosis is present, with no evidence of  aortic valve stenosis. Aortic valve mean gradient measures 4.0 mmHg.   5. The inferior vena cava is normal in size with greater than 50%  respiratory variability, suggesting right atrial pressure of 3 mmHg.   EKG: Sinus I have independently reviewed the above radiologic studies and discussed  with the patient   Recent Lab Findings: Lab Results  Component Value Date   WBC 4.4 04/05/2021   HGB 16.5 (H) 04/05/2021   HCT 48.9 (H) 04/05/2021   PLT 260 04/05/2021   GLUCOSE 102 (H) 04/05/2021   CHOL 147 11/30/2020   TRIG 79 11/30/2020   HDL 61 11/30/2020   LDLDIRECT 150.8 03/02/2013   LDLCALC 70 11/30/2020   ALT 34 11/30/2020   AST 28 11/30/2020   NA 139 04/05/2021   K 3.9 04/05/2021   CL 100 04/05/2021   CREATININE 0.95 04/05/2021   BUN 13 04/05/2021   CO2 24 04/05/2021   TSH 1.127 11/30/2020   INR 1.0 05/02/2020   HGBA1C 5.9 11/07/2020      Assessment / Plan:   70 year old female with severe three-vessel coronary artery disease.  I personally reviewed her left heart cath.  The LAD fills from right to left collaterals.  The circumflex does not have much disease however posterior lateral branch, and the left posterior atrioventricular branch fill from collaterals.  There is also a 90% stenosis in the RCA.  Otherwise the right-sided vessels free from much disease.  I also reviewed the echocardiogram.  She has preserved biventricular function.  She does have mild to moderate mitral valve regurgitation but the jet is mostly central.  We discussed the risks and benefits of surgical revascularization and she is agreeable to proceed.  We will plan for CABG x4 on May 07, 2021.  If she has any severe symptoms that do not resolve then she was instructed to come to the emergency department for  emergent evaluation.     I  spent 40 minutes counseling the patient face to face.   Lajuana Matte 04/26/2021 9:14 AM

## 2021-04-26 ENCOUNTER — Other Ambulatory Visit: Payer: Self-pay

## 2021-04-26 ENCOUNTER — Other Ambulatory Visit: Payer: Self-pay | Admitting: *Deleted

## 2021-04-26 ENCOUNTER — Institutional Professional Consult (permissible substitution) (INDEPENDENT_AMBULATORY_CARE_PROVIDER_SITE_OTHER): Payer: BC Managed Care – PPO | Admitting: Thoracic Surgery (Cardiothoracic Vascular Surgery)

## 2021-04-26 ENCOUNTER — Encounter: Payer: Self-pay | Admitting: *Deleted

## 2021-04-26 VITALS — BP 142/83 | HR 71 | Resp 20 | Ht 63.0 in | Wt 180.0 lb

## 2021-04-26 DIAGNOSIS — I2510 Atherosclerotic heart disease of native coronary artery without angina pectoris: Secondary | ICD-10-CM

## 2021-05-01 ENCOUNTER — Other Ambulatory Visit: Payer: Self-pay | Admitting: Internal Medicine

## 2021-05-02 NOTE — Pre-Procedure Instructions (Signed)
Surgical Instructions    Your procedure is scheduled on Tuesday, January 3rd.  Report to [facility] Main Entrance "A" at 05:30 A.M., then check in with the Admitting office.  Call this number if you have problems the morning of surgery:  [phone]   If you have any questions prior to your surgery date call [phone]: Open Monday-Friday 8am-4pm    Remember:  Do not eat or drink after midnight the night before your surgery     Take these medicines the morning of surgery with A SIP OF WATER  amLODipine (NORVASC) metoprolol tartrate (LOPRESSOR)  rosuvastatin (CRESTOR)   As of today, STOP taking any Aspirin, Aleve, Naproxen, Ibuprofen, Motrin, Advil, Goody's, BC's, all herbal medications, fish oil, and all vitamins.                     Do NOT Smoke (Tobacco/Vaping) or drink Alcohol 24 hours prior to your procedure.  If you use a CPAP at night, you may bring all equipment for your overnight stay.   Contacts, glasses, piercing's, hearing aid's, dentures or partials may not be worn into surgery, please bring cases for these belongings.    For patients admitted to the hospital, discharge time will be determined by your treatment team.   Patients discharged the day of surgery will not be allowed to drive home, and someone needs to stay with them for 24 hours.  NO VISITORS WILL BE ALLOWED IN PRE-OP WHERE PATIENTS GET READY FOR SURGERY.  ONLY 1 SUPPORT PERSON MAY BE PRESENT IN THE WAITING ROOM WHILE YOU ARE IN SURGERY.  IF YOU ARE TO BE ADMITTED, ONCE YOU ARE IN YOUR ROOM YOU WILL BE ALLOWED TWO (2) VISITORS.  Minor children may have two parents present. Special consideration for safety and communication needs will be reviewed on a case by case basis.   Special instructions:   [facility]- Preparing For Surgery  Before surgery, you can play an important role. Because skin is not sterile, your skin needs to be as free of germs as possible. You can reduce the number of germs on your  skin by washing with CHG (chlorahexidine gluconate) Soap before surgery.  CHG is an antiseptic cleaner which kills germs and bonds with the skin to continue killing germs even after washing.    Oral Hygiene is also important to reduce your risk of infection.  Remember - BRUSH YOUR TEETH THE MORNING OF SURGERY WITH YOUR REGULAR TOOTHPASTE  Please do not use if you have an allergy to CHG or antibacterial soaps. If your skin becomes reddened/irritated stop using the CHG.  Do not shave (including legs and underarms) for at least 48 hours prior to first CHG shower. It is OK to shave your face.  Please follow these instructions carefully.   Shower the NIGHT BEFORE SURGERY and the MORNING OF SURGERY  If you chose to wash your hair, wash your hair first as usual with your normal shampoo.  After you shampoo, rinse your hair and body thoroughly to remove the shampoo.  Use CHG Soap as you would any other liquid soap. You can apply CHG directly to the skin and wash gently with a scrungie or a clean washcloth.   Apply the CHG Soap to your body ONLY FROM THE NECK DOWN.  Do not use on open wounds or open sores. Avoid contact with your eyes, ears, mouth and genitals (private parts). Wash Face and genitals (private parts)  with your normal soap.   Wash  thoroughly, paying special attention to the area where your surgery will be performed.  Thoroughly rinse your body with warm water from the neck down.  DO NOT shower/wash with your normal soap after using and rinsing off the CHG Soap.  Pat yourself dry with a CLEAN TOWEL.  Wear CLEAN PAJAMAS to bed the night before surgery  Place CLEAN SHEETS on your bed the night before your surgery  DO NOT SLEEP WITH PETS.   Day of Surgery: Shower with CHG soap. Do not wear jewelry, make up, nail polish, gel polish, artificial nails, or any other type of covering on natural nails including finger and toenails. If patients have artificial nails, gel coating, etc.  that need to be removed by a nail salon please have this removed prior to surgery. Surgery may need to be canceled/delayed if the surgeon/ anesthesia feels like the patient is unable to be adequately monitored. Do not wear lotions, powders, perfumes, or deodorant. Do not shave 48 hours prior to surgery.   Do not bring valuables to the hospital. [facility] is not responsible for any belongings or valuables. Wear Clean/Comfortable clothing the morning of surgery Remember to brush your teeth WITH YOUR REGULAR TOOTHPASTE.   Please read over the following fact sheets that you were given.   3 days prior to your procedure or After your COVID test   You are not required to quarantine however you are required to wear a well-fitting mask when you are out and around people not in your household. If your mask becomes wet or soiled, replace with a new one.   Wash your hands often with soap and water for 20 seconds or clean your hands with an alcohol-based hand sanitizer that contains at least 60% alcohol.   Do not share personal items.   Notify your provider:  o if you are in close contact with someone who has COVID  o or if you develop a fever of 100.4 or greater, sneezing, cough, sore throat, shortness of breath or body aches.

## 2021-05-03 ENCOUNTER — Encounter (HOSPITAL_COMMUNITY): Payer: Self-pay

## 2021-05-03 ENCOUNTER — Ambulatory Visit (HOSPITAL_COMMUNITY)
Admission: RE | Admit: 2021-05-03 | Discharge: 2021-05-03 | Disposition: A | Discharge status: Home / Self Care | Payer: BC Managed Care – PPO | Source: Ambulatory Visit | Attending: Thoracic Surgery (Cardiothoracic Vascular Surgery) | Admitting: Thoracic Surgery (Cardiothoracic Vascular Surgery)

## 2021-05-03 ENCOUNTER — Other Ambulatory Visit: Payer: Self-pay

## 2021-05-03 ENCOUNTER — Encounter (HOSPITAL_COMMUNITY)
Admission: RE | Admit: 2021-05-03 | Discharge: 2021-05-03 | Disposition: A | Discharge status: Home / Self Care | Payer: BC Managed Care – PPO | Source: Ambulatory Visit | Attending: Thoracic Surgery (Cardiothoracic Vascular Surgery) | Admitting: Thoracic Surgery (Cardiothoracic Vascular Surgery)

## 2021-05-03 DIAGNOSIS — I10 Essential (primary) hypertension: Secondary | ICD-10-CM | POA: Diagnosis not present

## 2021-05-03 DIAGNOSIS — E785 Hyperlipidemia, unspecified: Secondary | ICD-10-CM | POA: Insufficient documentation

## 2021-05-03 DIAGNOSIS — Z20822 Contact with and (suspected) exposure to covid-19: Secondary | ICD-10-CM | POA: Diagnosis not present

## 2021-05-03 DIAGNOSIS — I2510 Atherosclerotic heart disease of native coronary artery without angina pectoris: Secondary | ICD-10-CM | POA: Insufficient documentation

## 2021-05-03 DIAGNOSIS — Z01818 Encounter for other preprocedural examination: Secondary | ICD-10-CM | POA: Diagnosis present

## 2021-05-03 LAB — URINALYSIS, ROUTINE W REFLEX MICROSCOPIC
Bilirubin Urine: NEGATIVE
Glucose, UA: NEGATIVE mg/dL
Hgb urine dipstick: NEGATIVE
Ketones, ur: NEGATIVE mg/dL
Nitrite: NEGATIVE
Protein, ur: NEGATIVE mg/dL
Specific Gravity, Urine: 1.01 (ref 1.005–1.030)
pH: 7 (ref 5.0–8.0)

## 2021-05-03 LAB — COMPREHENSIVE METABOLIC PANEL
ALT: 37 U/L (ref 0–44)
AST: 30 U/L (ref 15–41)
Albumin: 4.3 g/dL (ref 3.5–5.0)
Alkaline Phosphatase: 62 U/L (ref 38–126)
Anion gap: 12 (ref 5–15)
BUN: 12 mg/dL (ref 8–23)
CO2: 21 mmol/L — ABNORMAL LOW (ref 22–32)
Calcium: 9.3 mg/dL (ref 8.9–10.3)
Chloride: 103 mmol/L (ref 98–111)
Creatinine, Ser: 0.8 mg/dL (ref 0.44–1.00)
GFR, Estimated: >60 mL/min (ref >60)
Glucose, Bld: 99 mg/dL (ref 70–99)
Potassium: 3.7 mmol/L (ref 3.5–5.1)
Sodium: 136 mmol/L (ref 135–145)
Total Bilirubin: 1.2 mg/dL (ref 0.3–1.2)
Total Protein: 7.4 g/dL (ref 6.5–8.1)

## 2021-05-03 LAB — SURGICAL PCR SCREEN
MRSA, PCR: NEGATIVE
Staphylococcus aureus: POSITIVE — AB

## 2021-05-03 LAB — BLOOD GAS, ARTERIAL
Acid-Base Excess: 1.8 mmol/L (ref 0.0–2.0)
Bicarbonate: 26 mmol/L (ref 20.0–28.0)
FIO2: 21
O2 Saturation: 97.9 %
Patient temperature: 37
pCO2 arterial: 41.9 mmHg (ref 32.0–48.0)
pH, Arterial: 7.41 (ref 7.350–7.450)
pO2, Arterial: 103 mmHg (ref 83.0–108.0)

## 2021-05-03 LAB — TYPE AND SCREEN
ABO/RH(D): B POS
Antibody Screen: NEGATIVE

## 2021-05-03 LAB — SARS CORONAVIRUS 2 (TAT 6-24 HRS): SARS Coronavirus 2: NEGATIVE

## 2021-05-03 LAB — PROTIME-INR
INR: 0.9 (ref 0.8–1.2)
Prothrombin Time: 12 seconds (ref 11.4–15.2)

## 2021-05-03 LAB — CBC
HCT: 46.6 % — ABNORMAL HIGH (ref 36.0–46.0)
Hemoglobin: 15.4 g/dL — ABNORMAL HIGH (ref 12.0–15.0)
MCH: 29.1 pg (ref 26.0–34.0)
MCHC: 33 g/dL (ref 30.0–36.0)
MCV: 88.1 fL (ref 80.0–100.0)
Platelets: 220 10*3/uL (ref 150–400)
RBC: 5.29 MIL/uL — ABNORMAL HIGH (ref 3.87–5.11)
RDW: 12.4 % (ref 11.5–15.5)
WBC: 4.6 10*3/uL (ref 4.0–10.5)
nRBC: 0 % (ref 0.0–0.2)

## 2021-05-03 LAB — APTT: aPTT: 25 seconds (ref 24–36)

## 2021-05-03 LAB — HEMOGLOBIN A1C
Hgb A1c MFr Bld: 5.8 % — ABNORMAL HIGH (ref 4.8–5.6)
Mean Plasma Glucose: 119.76 mg/dL

## 2021-05-03 NOTE — Progress Notes (Signed)
PCP - Dr. [name] Cardiologist - Dr. [name]  PPM/ICD - denies   Chest x-ray - 05/03/21 at PAT EKG - 05/03/21 at PAT Stress Test - denies ECHO - 04/19/21 Cardiac Cath - 04/15/21  Sleep Study - 12/28/17, diagnosed with OSA CPAP - yes  DM- denies  Blood Thinner Instructions: n/a Aspirin Instructions: hold morning of surgery  ERAS Protcol - no, NPO   COVID TEST- 05/03/21 at PAT   Anesthesia review: yes, cardiac hx  Patient denies shortness of breath, fever, cough and chest pain at PAT appointment   All instructions explained to the patient, with a verbal understanding of the material. Patient agrees to go over the instructions while at home for a better understanding. Patient also instructed to wear a mask in public after being tested for COVID-19. The opportunity to ask questions was provided.

## 2021-05-03 NOTE — Progress Notes (Signed)
Pre cabg has been completed.   Preliminary results in CV Proc.   Susan Weeks 05/03/2021 9:42 AM

## 2021-05-06 MED ORDER — PHENYLEPHRINE HCL-NACL 20-0.9 MG/250ML-% IV SOLN
30.0000 ug/min | INTRAVENOUS | Status: AC
  Administered 2021-05-07: 25 ug/min via INTRAVENOUS
  Administered 2021-05-07: 40 ug/min via INTRAVENOUS
  Filled 2021-05-06: qty 250

## 2021-05-06 MED ORDER — CEFAZOLIN SODIUM-DEXTROSE 2-4 GM/100ML-% IV SOLN
2.0000 g | INTRAVENOUS | Status: AC
  Administered 2021-05-07: 2 g via INTRAVENOUS
  Filled 2021-05-06: qty 100

## 2021-05-06 MED ORDER — MILRINONE LACTATE IN DEXTROSE 20-5 MG/100ML-% IV SOLN
0.3000 ug/kg/min | INTRAVENOUS | Status: DC
  Filled 2021-05-06: qty 100

## 2021-05-06 MED ORDER — NITROGLYCERIN IN D5W 200-5 MCG/ML-% IV SOLN
2.0000 ug/min | INTRAVENOUS | Status: AC
  Administered 2021-05-07: 5 ug/min via INTRAVENOUS
  Filled 2021-05-06: qty 250

## 2021-05-06 MED ORDER — PLASMA-LYTE A IV SOLN
INTRAVENOUS | Status: DC
  Filled 2021-05-06: qty 5

## 2021-05-06 MED ORDER — HEPARIN 30,000 UNITS/1000 ML (OHS) CELLSAVER SOLUTION
Status: DC
  Filled 2021-05-06: qty 1000

## 2021-05-06 MED ORDER — EPINEPHRINE HCL 5 MG/250ML IV SOLN IN NS
0.0000 ug/min | INTRAVENOUS | Status: DC
  Filled 2021-05-06: qty 250

## 2021-05-06 MED ORDER — INSULIN REGULAR(HUMAN) IN NACL 100-0.9 UT/100ML-% IV SOLN
INTRAVENOUS | Status: AC
  Administered 2021-05-07: 1.5 [IU]/h via INTRAVENOUS
  Filled 2021-05-06: qty 100

## 2021-05-06 MED ORDER — NOREPINEPHRINE 4 MG/250ML-% IV SOLN
0.0000 ug/min | INTRAVENOUS | Status: DC
  Filled 2021-05-06: qty 250

## 2021-05-06 MED ORDER — MANNITOL 20 % IV SOLN
INTRAVENOUS | Status: DC
  Filled 2021-05-06: qty 13

## 2021-05-06 MED ORDER — DEXMEDETOMIDINE HCL IN NACL 400 MCG/100ML IV SOLN
0.1000 ug/kg/h | INTRAVENOUS | Status: AC
  Administered 2021-05-07: .4 ug/kg/h via INTRAVENOUS
  Filled 2021-05-06: qty 100

## 2021-05-06 MED ORDER — POTASSIUM CHLORIDE 2 MEQ/ML IV SOLN
80.0000 meq | INTRAVENOUS | Status: DC
  Filled 2021-05-06: qty 40

## 2021-05-06 MED ORDER — VANCOMYCIN HCL 1500 MG/300ML IV SOLN
1500.0000 mg | INTRAVENOUS | Status: AC
  Administered 2021-05-07: 1500 mg via INTRAVENOUS
  Filled 2021-05-06: qty 300

## 2021-05-06 MED ORDER — TRANEXAMIC ACID (OHS) BOLUS VIA INFUSION
15.0000 mg/kg | INTRAVENOUS | Status: AC
  Administered 2021-05-07: 1261.5 mg via INTRAVENOUS
  Filled 2021-05-06: qty 1262

## 2021-05-06 MED ORDER — TRANEXAMIC ACID (OHS) PUMP PRIME SOLUTION
2.0000 mg/kg | INTRAVENOUS | Status: DC
  Filled 2021-05-06: qty 1.68

## 2021-05-06 MED ORDER — TRANEXAMIC ACID 1000 MG/10ML IV SOLN
1.5000 mg/kg/h | INTRAVENOUS | Status: AC
  Administered 2021-05-07: 1.5 mg/kg/h via INTRAVENOUS
  Filled 2021-05-06: qty 25

## 2021-05-07 ENCOUNTER — Inpatient Hospital Stay (HOSPITAL_COMMUNITY)
Admission: RE | Disposition: A | Discharge status: Home / Self Care | Payer: Self-pay | Source: Home / Self Care | Attending: Thoracic Surgery (Cardiothoracic Vascular Surgery)

## 2021-05-07 ENCOUNTER — Inpatient Hospital Stay (HOSPITAL_COMMUNITY): Payer: BC Managed Care – PPO

## 2021-05-07 ENCOUNTER — Inpatient Hospital Stay (HOSPITAL_COMMUNITY): Payer: BC Managed Care – PPO | Admitting: Certified Registered"

## 2021-05-07 ENCOUNTER — Other Ambulatory Visit: Payer: Self-pay

## 2021-05-07 ENCOUNTER — Inpatient Hospital Stay (HOSPITAL_COMMUNITY)
Admission: RE | Admit: 2021-05-07 | Discharge: 2021-05-13 | DRG: 236 | Disposition: A | Discharge status: Home / Self Care | Payer: BC Managed Care – PPO | Attending: Thoracic Surgery (Cardiothoracic Vascular Surgery) | Admitting: Thoracic Surgery (Cardiothoracic Vascular Surgery)

## 2021-05-07 ENCOUNTER — Encounter (HOSPITAL_COMMUNITY): Payer: Self-pay | Admitting: Thoracic Surgery (Cardiothoracic Vascular Surgery)

## 2021-05-07 DIAGNOSIS — I97190 Other postprocedural cardiac functional disturbances following cardiac surgery: Secondary | ICD-10-CM | POA: Diagnosis not present

## 2021-05-07 DIAGNOSIS — M7989 Other specified soft tissue disorders: Secondary | ICD-10-CM | POA: Diagnosis not present

## 2021-05-07 DIAGNOSIS — Z91048 Other nonmedicinal substance allergy status: Secondary | ICD-10-CM

## 2021-05-07 DIAGNOSIS — Z85038 Personal history of other malignant neoplasm of large intestine: Secondary | ICD-10-CM | POA: Diagnosis not present

## 2021-05-07 DIAGNOSIS — D62 Acute posthemorrhagic anemia: Secondary | ICD-10-CM | POA: Diagnosis not present

## 2021-05-07 DIAGNOSIS — Z888 Allergy status to other drugs, medicaments and biological substances status: Secondary | ICD-10-CM | POA: Diagnosis not present

## 2021-05-07 DIAGNOSIS — E7800 Pure hypercholesterolemia, unspecified: Secondary | ICD-10-CM

## 2021-05-07 DIAGNOSIS — Z09 Encounter for follow-up examination after completed treatment for conditions other than malignant neoplasm: Secondary | ICD-10-CM

## 2021-05-07 DIAGNOSIS — I4891 Unspecified atrial fibrillation: Secondary | ICD-10-CM | POA: Diagnosis not present

## 2021-05-07 DIAGNOSIS — Z79899 Other long term (current) drug therapy: Secondary | ICD-10-CM | POA: Diagnosis not present

## 2021-05-07 DIAGNOSIS — Z823 Family history of stroke: Secondary | ICD-10-CM

## 2021-05-07 DIAGNOSIS — I2510 Atherosclerotic heart disease of native coronary artery without angina pectoris: Secondary | ICD-10-CM | POA: Diagnosis not present

## 2021-05-07 DIAGNOSIS — Z20822 Contact with and (suspected) exposure to covid-19: Secondary | ICD-10-CM | POA: Diagnosis present

## 2021-05-07 DIAGNOSIS — Z6833 Body mass index (BMI) 33.0-33.9, adult: Secondary | ICD-10-CM | POA: Diagnosis not present

## 2021-05-07 DIAGNOSIS — Z8719 Personal history of other diseases of the digestive system: Secondary | ICD-10-CM | POA: Diagnosis not present

## 2021-05-07 DIAGNOSIS — E669 Obesity, unspecified: Secondary | ICD-10-CM | POA: Diagnosis present

## 2021-05-07 DIAGNOSIS — E871 Hypo-osmolality and hyponatremia: Secondary | ICD-10-CM | POA: Diagnosis present

## 2021-05-07 DIAGNOSIS — E8770 Fluid overload, unspecified: Secondary | ICD-10-CM | POA: Diagnosis present

## 2021-05-07 DIAGNOSIS — J90 Pleural effusion, not elsewhere classified: Secondary | ICD-10-CM | POA: Diagnosis not present

## 2021-05-07 DIAGNOSIS — D696 Thrombocytopenia, unspecified: Secondary | ICD-10-CM | POA: Diagnosis present

## 2021-05-07 DIAGNOSIS — G4733 Obstructive sleep apnea (adult) (pediatric): Secondary | ICD-10-CM | POA: Diagnosis present

## 2021-05-07 DIAGNOSIS — Z8249 Family history of ischemic heart disease and other diseases of the circulatory system: Secondary | ICD-10-CM | POA: Diagnosis not present

## 2021-05-07 DIAGNOSIS — Y838 Other surgical procedures as the cause of abnormal reaction of the patient, or of later complication, without mention of misadventure at the time of the procedure: Secondary | ICD-10-CM | POA: Diagnosis not present

## 2021-05-07 DIAGNOSIS — E785 Hyperlipidemia, unspecified: Secondary | ICD-10-CM | POA: Diagnosis present

## 2021-05-07 DIAGNOSIS — I440 Atrioventricular block, first degree: Secondary | ICD-10-CM | POA: Diagnosis present

## 2021-05-07 DIAGNOSIS — Z951 Presence of aortocoronary bypass graft: Secondary | ICD-10-CM

## 2021-05-07 DIAGNOSIS — Z881 Allergy status to other antibiotic agents status: Secondary | ICD-10-CM

## 2021-05-07 DIAGNOSIS — Z87891 Personal history of nicotine dependence: Secondary | ICD-10-CM | POA: Diagnosis not present

## 2021-05-07 DIAGNOSIS — I10 Essential (primary) hypertension: Secondary | ICD-10-CM | POA: Diagnosis present

## 2021-05-07 DIAGNOSIS — I25118 Atherosclerotic heart disease of native coronary artery with other forms of angina pectoris: Principal | ICD-10-CM | POA: Diagnosis present

## 2021-05-07 DIAGNOSIS — J939 Pneumothorax, unspecified: Secondary | ICD-10-CM

## 2021-05-07 DIAGNOSIS — E876 Hypokalemia: Secondary | ICD-10-CM | POA: Diagnosis not present

## 2021-05-07 DIAGNOSIS — Z9889 Other specified postprocedural states: Secondary | ICD-10-CM

## 2021-05-07 DIAGNOSIS — G4730 Sleep apnea, unspecified: Secondary | ICD-10-CM

## 2021-05-07 DIAGNOSIS — Z7982 Long term (current) use of aspirin: Secondary | ICD-10-CM | POA: Diagnosis not present

## 2021-05-07 DIAGNOSIS — I9581 Postprocedural hypotension: Secondary | ICD-10-CM | POA: Diagnosis not present

## 2021-05-07 HISTORY — PX: CORONARY ARTERY BYPASS GRAFT: SHX141

## 2021-05-07 HISTORY — PX: ENDOVEIN HARVEST OF GREATER SAPHENOUS VEIN: SHX5059

## 2021-05-07 HISTORY — PX: TEE WITHOUT CARDIOVERSION: SHX5443

## 2021-05-07 LAB — CBC
HCT: 32.1 % — ABNORMAL LOW (ref 36.0–46.0)
HCT: 29.3 % — ABNORMAL LOW (ref 36.0–46.0)
Hemoglobin: 11.1 g/dL — ABNORMAL LOW (ref 12.0–15.0)
Hemoglobin: 9.8 g/dL — ABNORMAL LOW (ref 12.0–15.0)
MCH: 30.1 pg (ref 26.0–34.0)
MCH: 29.2 pg (ref 26.0–34.0)
MCHC: 34.6 g/dL (ref 30.0–36.0)
MCHC: 33.4 g/dL (ref 30.0–36.0)
MCV: 87 fL (ref 80.0–100.0)
MCV: 87.2 fL (ref 80.0–100.0)
Platelets: 101 10*3/uL — ABNORMAL LOW (ref 150–400)
Platelets: 98 10*3/uL — ABNORMAL LOW (ref 150–400)
RBC: 3.69 MIL/uL — ABNORMAL LOW (ref 3.87–5.11)
RBC: 3.36 MIL/uL — ABNORMAL LOW (ref 3.87–5.11)
RDW: 12.5 % (ref 11.5–15.5)
RDW: 12.8 % (ref 11.5–15.5)
WBC: 10.1 10*3/uL (ref 4.0–10.5)
WBC: 8.9 10*3/uL (ref 4.0–10.5)
nRBC: 0 % (ref 0.0–0.2)
nRBC: 0 % (ref 0.0–0.2)

## 2021-05-07 LAB — POCT I-STAT 7, (LYTES, BLD GAS, ICA,H+H)
Acid-Base Excess: 4 mmol/L — ABNORMAL HIGH (ref 0.0–2.0)
Acid-Base Excess: 3 mmol/L — ABNORMAL HIGH (ref 0.0–2.0)
Acid-Base Excess: 3 mmol/L — ABNORMAL HIGH (ref 0.0–2.0)
Acid-Base Excess: 4 mmol/L — ABNORMAL HIGH (ref 0.0–2.0)
Acid-base deficit: 5 mmol/L — ABNORMAL HIGH (ref 0.0–2.0)
Acid-base deficit: 7 mmol/L — ABNORMAL HIGH (ref 0.0–2.0)
Acid-base deficit: 3 mmol/L — ABNORMAL HIGH (ref 0.0–2.0)
Bicarbonate: 27.3 mmol/L (ref 20.0–28.0)
Bicarbonate: 26.1 mmol/L (ref 20.0–28.0)
Bicarbonate: 26.5 mmol/L (ref 20.0–28.0)
Bicarbonate: 27.3 mmol/L (ref 20.0–28.0)
Bicarbonate: 22 mmol/L (ref 20.0–28.0)
Bicarbonate: 18.3 mmol/L — ABNORMAL LOW (ref 20.0–28.0)
Bicarbonate: 21.3 mmol/L (ref 20.0–28.0)
Calcium, Ion: 0.85 mmol/L — CL (ref 1.15–1.40)
Calcium, Ion: 0.91 mmol/L — ABNORMAL LOW (ref 1.15–1.40)
Calcium, Ion: 0.91 mmol/L — ABNORMAL LOW (ref 1.15–1.40)
Calcium, Ion: 0.92 mmol/L — ABNORMAL LOW (ref 1.15–1.40)
Calcium, Ion: 1.17 mmol/L (ref 1.15–1.40)
Calcium, Ion: 0.98 mmol/L — ABNORMAL LOW (ref 1.15–1.40)
Calcium, Ion: 1.04 mmol/L — ABNORMAL LOW (ref 1.15–1.40)
HCT: 26 % — ABNORMAL LOW (ref 36.0–46.0)
HCT: 21 % — ABNORMAL LOW (ref 36.0–46.0)
HCT: 22 % — ABNORMAL LOW (ref 36.0–46.0)
HCT: 23 % — ABNORMAL LOW (ref 36.0–46.0)
HCT: 32 % — ABNORMAL LOW (ref 36.0–46.0)
HCT: 24 % — ABNORMAL LOW (ref 36.0–46.0)
HCT: 29 % — ABNORMAL LOW (ref 36.0–46.0)
Hemoglobin: 8.8 g/dL — ABNORMAL LOW (ref 12.0–15.0)
Hemoglobin: 7.1 g/dL — ABNORMAL LOW (ref 12.0–15.0)
Hemoglobin: 7.5 g/dL — ABNORMAL LOW (ref 12.0–15.0)
Hemoglobin: 7.8 g/dL — ABNORMAL LOW (ref 12.0–15.0)
Hemoglobin: 10.9 g/dL — ABNORMAL LOW (ref 12.0–15.0)
Hemoglobin: 8.2 g/dL — ABNORMAL LOW (ref 12.0–15.0)
Hemoglobin: 9.9 g/dL — ABNORMAL LOW (ref 12.0–15.0)
O2 Saturation: 100 %
O2 Saturation: 100 %
O2 Saturation: 100 %
O2 Saturation: 100 %
O2 Saturation: 98 %
O2 Saturation: 98 %
O2 Saturation: 98 %
Patient temperature: 36.7
Patient temperature: 37.2
Patient temperature: 37.5
Potassium: 3.5 mmol/L (ref 3.5–5.1)
Potassium: 3.7 mmol/L (ref 3.5–5.1)
Potassium: 4 mmol/L (ref 3.5–5.1)
Potassium: 4 mmol/L (ref 3.5–5.1)
Potassium: 3.4 mmol/L — ABNORMAL LOW (ref 3.5–5.1)
Potassium: 3.6 mmol/L (ref 3.5–5.1)
Potassium: 4.1 mmol/L (ref 3.5–5.1)
Sodium: 138 mmol/L (ref 135–145)
Sodium: 137 mmol/L (ref 135–145)
Sodium: 137 mmol/L (ref 135–145)
Sodium: 137 mmol/L (ref 135–145)
Sodium: 141 mmol/L (ref 135–145)
Sodium: 143 mmol/L (ref 135–145)
Sodium: 142 mmol/L (ref 135–145)
TCO2: 28 mmol/L (ref 22–32)
TCO2: 27 mmol/L (ref 22–32)
TCO2: 27 mmol/L (ref 22–32)
TCO2: 28 mmol/L (ref 22–32)
TCO2: 23 mmol/L (ref 22–32)
TCO2: 19 mmol/L — ABNORMAL LOW (ref 22–32)
TCO2: 22 mmol/L (ref 22–32)
pCO2 arterial: 32.7 mmHg (ref 32.0–48.0)
pCO2 arterial: 31.2 mmHg — ABNORMAL LOW (ref 32.0–48.0)
pCO2 arterial: 31.3 mmHg — ABNORMAL LOW (ref 32.0–48.0)
pCO2 arterial: 37.9 mmHg (ref 32.0–48.0)
pCO2 arterial: 45.6 mmHg (ref 32.0–48.0)
pCO2 arterial: 35.6 mmHg (ref 32.0–48.0)
pCO2 arterial: 37.1 mmHg (ref 32.0–48.0)
pH, Arterial: 7.53 — ABNORMAL HIGH (ref 7.350–7.450)
pH, Arterial: 7.466 — ABNORMAL HIGH (ref 7.350–7.450)
pH, Arterial: 7.531 — ABNORMAL HIGH (ref 7.350–7.450)
pH, Arterial: 7.535 — ABNORMAL HIGH (ref 7.350–7.450)
pH, Arterial: 7.29 — ABNORMAL LOW (ref 7.350–7.450)
pH, Arterial: 7.32 — ABNORMAL LOW (ref 7.350–7.450)
pH, Arterial: 7.37 (ref 7.350–7.450)
pO2, Arterial: 447 mmHg — ABNORMAL HIGH (ref 83.0–108.0)
pO2, Arterial: 367 mmHg — ABNORMAL HIGH (ref 83.0–108.0)
pO2, Arterial: 369 mmHg — ABNORMAL HIGH (ref 83.0–108.0)
pO2, Arterial: 411 mmHg — ABNORMAL HIGH (ref 83.0–108.0)
pO2, Arterial: 125 mmHg — ABNORMAL HIGH (ref 83.0–108.0)
pO2, Arterial: 110 mmHg — ABNORMAL HIGH (ref 83.0–108.0)
pO2, Arterial: 104 mmHg (ref 83.0–108.0)

## 2021-05-07 LAB — POCT I-STAT, CHEM 8
BUN: 14 mg/dL (ref 8–23)
BUN: 13 mg/dL (ref 8–23)
BUN: 13 mg/dL (ref 8–23)
BUN: 13 mg/dL (ref 8–23)
Calcium, Ion: 1.12 mmol/L — ABNORMAL LOW (ref 1.15–1.40)
Calcium, Ion: 1.09 mmol/L — ABNORMAL LOW (ref 1.15–1.40)
Calcium, Ion: 0.89 mmol/L — CL (ref 1.15–1.40)
Calcium, Ion: 1.16 mmol/L (ref 1.15–1.40)
Chloride: 104 mmol/L (ref 98–111)
Chloride: 105 mmol/L (ref 98–111)
Chloride: 101 mmol/L (ref 98–111)
Chloride: 105 mmol/L (ref 98–111)
Creatinine, Ser: 0.7 mg/dL (ref 0.44–1.00)
Creatinine, Ser: 0.6 mg/dL (ref 0.44–1.00)
Creatinine, Ser: 0.6 mg/dL (ref 0.44–1.00)
Creatinine, Ser: 0.6 mg/dL (ref 0.44–1.00)
Glucose, Bld: 125 mg/dL — ABNORMAL HIGH (ref 70–99)
Glucose, Bld: 134 mg/dL — ABNORMAL HIGH (ref 70–99)
Glucose, Bld: 105 mg/dL — ABNORMAL HIGH (ref 70–99)
Glucose, Bld: 132 mg/dL — ABNORMAL HIGH (ref 70–99)
HCT: 43 % (ref 36.0–46.0)
HCT: 36 % (ref 36.0–46.0)
HCT: 22 % — ABNORMAL LOW (ref 36.0–46.0)
HCT: 20 % — ABNORMAL LOW (ref 36.0–46.0)
Hemoglobin: 14.6 g/dL (ref 12.0–15.0)
Hemoglobin: 12.2 g/dL (ref 12.0–15.0)
Hemoglobin: 7.5 g/dL — ABNORMAL LOW (ref 12.0–15.0)
Hemoglobin: 6.8 g/dL — CL (ref 12.0–15.0)
Potassium: 3.3 mmol/L — ABNORMAL LOW (ref 3.5–5.1)
Potassium: 2.9 mmol/L — ABNORMAL LOW (ref 3.5–5.1)
Potassium: 3.6 mmol/L (ref 3.5–5.1)
Potassium: 3.5 mmol/L (ref 3.5–5.1)
Sodium: 139 mmol/L (ref 135–145)
Sodium: 139 mmol/L (ref 135–145)
Sodium: 137 mmol/L (ref 135–145)
Sodium: 139 mmol/L (ref 135–145)
TCO2: 26 mmol/L (ref 22–32)
TCO2: 24 mmol/L (ref 22–32)
TCO2: 26 mmol/L (ref 22–32)
TCO2: 25 mmol/L (ref 22–32)

## 2021-05-07 LAB — POCT I-STAT EG7
Acid-base deficit: 1 mmol/L (ref 0.0–2.0)
Bicarbonate: 22.8 mmol/L (ref 20.0–28.0)
Calcium, Ion: 0.82 mmol/L — CL (ref 1.15–1.40)
HCT: 22 % — ABNORMAL LOW (ref 36.0–46.0)
Hemoglobin: 7.5 g/dL — ABNORMAL LOW (ref 12.0–15.0)
O2 Saturation: 73 %
Potassium: 5.9 mmol/L — ABNORMAL HIGH (ref 3.5–5.1)
Sodium: 137 mmol/L (ref 135–145)
TCO2: 24 mmol/L (ref 22–32)
pCO2, Ven: 33.2 mmHg — ABNORMAL LOW (ref 44.0–60.0)
pH, Ven: 7.445 — ABNORMAL HIGH (ref 7.250–7.430)
pO2, Ven: 37 mmHg (ref 32.0–45.0)

## 2021-05-07 LAB — GLUCOSE, CAPILLARY
Glucose-Capillary: 129 mg/dL — ABNORMAL HIGH (ref 70–99)
Glucose-Capillary: 108 mg/dL — ABNORMAL HIGH (ref 70–99)
Glucose-Capillary: 164 mg/dL — ABNORMAL HIGH (ref 70–99)
Glucose-Capillary: 144 mg/dL — ABNORMAL HIGH (ref 70–99)
Glucose-Capillary: 154 mg/dL — ABNORMAL HIGH (ref 70–99)
Glucose-Capillary: 158 mg/dL — ABNORMAL HIGH (ref 70–99)
Glucose-Capillary: 158 mg/dL — ABNORMAL HIGH (ref 70–99)
Glucose-Capillary: 154 mg/dL — ABNORMAL HIGH (ref 70–99)
Glucose-Capillary: 138 mg/dL — ABNORMAL HIGH (ref 70–99)

## 2021-05-07 LAB — HEMOGLOBIN AND HEMATOCRIT, BLOOD
HCT: 21.9 % — ABNORMAL LOW (ref 36.0–46.0)
Hemoglobin: 7.8 g/dL — ABNORMAL LOW (ref 12.0–15.0)

## 2021-05-07 LAB — ABO/RH: ABO/RH(D): B POS

## 2021-05-07 LAB — BASIC METABOLIC PANEL
Anion gap: 7 (ref 5–15)
BUN: 11 mg/dL (ref 8–23)
CO2: 22 mmol/L (ref 22–32)
Calcium: 7 mg/dL — ABNORMAL LOW (ref 8.9–10.3)
Chloride: 108 mmol/L (ref 98–111)
Creatinine, Ser: 0.81 mg/dL (ref 0.44–1.00)
GFR, Estimated: >60 mL/min (ref >60)
Glucose, Bld: 196 mg/dL — ABNORMAL HIGH (ref 70–99)
Potassium: 4 mmol/L (ref 3.5–5.1)
Sodium: 137 mmol/L (ref 135–145)

## 2021-05-07 LAB — PROTIME-INR
INR: 1.6 — ABNORMAL HIGH (ref 0.8–1.2)
Prothrombin Time: 19.1 seconds — ABNORMAL HIGH (ref 11.4–15.2)

## 2021-05-07 LAB — APTT: aPTT: 30 seconds (ref 24–36)

## 2021-05-07 LAB — MAGNESIUM: Magnesium: 3.2 mg/dL — ABNORMAL HIGH (ref 1.7–2.4)

## 2021-05-07 LAB — PLATELET COUNT: Platelets: 120 10*3/uL — ABNORMAL LOW (ref 150–400)

## 2021-05-07 SURGERY — CORONARY ARTERY BYPASS GRAFTING (CABG)
Anesthesia: General | Site: Leg Lower

## 2021-05-07 MED ORDER — MIDAZOLAM HCL 5 MG/5ML IJ SOLN
INTRAMUSCULAR | Status: DC | PRN
  Administered 2021-05-07: 4 mg via INTRAVENOUS
  Administered 2021-05-07: 1 mg via INTRAVENOUS
  Administered 2021-05-07: 2 mg via INTRAVENOUS
  Administered 2021-05-07: 1 mg via INTRAVENOUS
  Administered 2021-05-07: 2 mg via INTRAVENOUS

## 2021-05-07 MED ORDER — ACETAMINOPHEN 160 MG/5ML PO SOLN: 650.0000 mg | Freq: Once | ORAL | Status: AC

## 2021-05-07 MED ORDER — PHENYLEPHRINE HCL (PRESSORS) 10 MG/ML IV SOLN
INTRAVENOUS | Status: AC
  Filled 2021-05-07: qty 1

## 2021-05-07 MED ORDER — ROCURONIUM BROMIDE 10 MG/ML (PF) SYRINGE
PREFILLED_SYRINGE | INTRAVENOUS | Status: DC | PRN
  Administered 2021-05-07: 30 mg via INTRAVENOUS
  Administered 2021-05-07: 10 mg via INTRAVENOUS
  Administered 2021-05-07: 20 mg via INTRAVENOUS
  Administered 2021-05-07: 70 mg via INTRAVENOUS
  Administered 2021-05-07: 50 mg via INTRAVENOUS

## 2021-05-07 MED ORDER — LACTATED RINGERS IV SOLN
500.0000 mL | Freq: Once | INTRAVENOUS | Status: AC | PRN
  Administered 2021-05-07: 500 mL via INTRAVENOUS

## 2021-05-07 MED ORDER — SODIUM BICARBONATE 8.4 % IV SOLN
50.0000 meq | Freq: Once | INTRAVENOUS | Status: AC
  Administered 2021-05-07: 50 meq via INTRAVENOUS

## 2021-05-07 MED ORDER — MORPHINE SULFATE (PF) 2 MG/ML IV SOLN: 1.0000 mg | INTRAVENOUS | Status: DC | PRN

## 2021-05-07 MED ORDER — METOPROLOL TARTRATE 12.5 MG HALF TABLET
12.5000 mg | ORAL_TABLET | Freq: Two times a day (BID) | ORAL | Status: DC
  Administered 2021-05-08 – 2021-05-09 (×3): 12.5 mg via ORAL
  Filled 2021-05-07 (×3): qty 1

## 2021-05-07 MED ORDER — POTASSIUM CHLORIDE 10 MEQ/50ML IV SOLN
10.0000 meq | INTRAVENOUS | Status: AC
  Administered 2021-05-07 (×3): 10 meq via INTRAVENOUS

## 2021-05-07 MED ORDER — SODIUM CHLORIDE (PF) 0.9 % IJ SOLN
OROMUCOSAL | Status: DC | PRN
  Administered 2021-05-07 (×3): 4 mL via TOPICAL

## 2021-05-07 MED ORDER — 0.9 % SODIUM CHLORIDE (POUR BTL) OPTIME
TOPICAL | Status: DC | PRN
  Administered 2021-05-07: 6000 mL

## 2021-05-07 MED ORDER — TRAMADOL HCL 50 MG PO TABS
50.0000 mg | ORAL_TABLET | ORAL | Status: DC | PRN
  Administered 2021-05-10: 100 mg via ORAL
  Filled 2021-05-07: qty 2

## 2021-05-07 MED ORDER — SODIUM CHLORIDE 0.45 % IV SOLN: INTRAVENOUS | Status: DC | PRN

## 2021-05-07 MED ORDER — MIDAZOLAM HCL (PF) 10 MG/2ML IJ SOLN
INTRAMUSCULAR | Status: AC
  Filled 2021-05-07: qty 2

## 2021-05-07 MED ORDER — BISACODYL 5 MG PO TBEC
10.0000 mg | DELAYED_RELEASE_TABLET | Freq: Every day | ORAL | Status: DC
  Administered 2021-05-08 – 2021-05-12 (×3): 10 mg via ORAL
  Filled 2021-05-07 (×4): qty 2

## 2021-05-07 MED ORDER — AMIODARONE HCL IN DEXTROSE 360-4.14 MG/200ML-% IV SOLN
INTRAVENOUS | Status: DC | PRN
  Administered 2021-05-07: 30 mg/h via INTRAVENOUS

## 2021-05-07 MED ORDER — SODIUM CHLORIDE 0.9% FLUSH
10.0000 mL | Freq: Two times a day (BID) | INTRAVENOUS | Status: DC
  Administered 2021-05-07 – 2021-05-08 (×4): 10 mL

## 2021-05-07 MED ORDER — AMIODARONE HCL IN DEXTROSE 360-4.14 MG/200ML-% IV SOLN
30.0000 mg/h | INTRAVENOUS | Status: DC
  Administered 2021-05-08 (×2): 30 mg/h via INTRAVENOUS
  Filled 2021-05-07 (×4): qty 200

## 2021-05-07 MED ORDER — FENTANYL CITRATE (PF) 250 MCG/5ML IJ SOLN
INTRAMUSCULAR | Status: AC
  Filled 2021-05-07: qty 5

## 2021-05-07 MED ORDER — SODIUM CHLORIDE 0.9 % IV SOLN: INTRAVENOUS | Status: DC

## 2021-05-07 MED ORDER — ROSUVASTATIN CALCIUM 5 MG PO TABS
5.0000 mg | ORAL_TABLET | Freq: Every day | ORAL | Status: DC
Start: 2021-05-08 — End: 2021-05-13
  Administered 2021-05-08 – 2021-05-12 (×5): 5 mg via ORAL
  Filled 2021-05-07 (×6): qty 1

## 2021-05-07 MED ORDER — CHLORHEXIDINE GLUCONATE CLOTH 2 % EX PADS: 6.0000 | MEDICATED_PAD | Freq: Every day | CUTANEOUS | Status: DC

## 2021-05-07 MED ORDER — ASPIRIN 81 MG PO CHEW: 324.0000 mg | CHEWABLE_TABLET | Freq: Every day | ORAL | Status: DC

## 2021-05-07 MED ORDER — VANCOMYCIN HCL IN DEXTROSE 1-5 GM/200ML-% IV SOLN
1000.0000 mg | Freq: Once | INTRAVENOUS | Status: AC
  Administered 2021-05-07: 1000 mg via INTRAVENOUS
  Filled 2021-05-07: qty 200

## 2021-05-07 MED ORDER — DOBUTAMINE IN D5W 4-5 MG/ML-% IV SOLN: 0.0000 ug/kg/min | INTRAVENOUS | Status: DC

## 2021-05-07 MED ORDER — METOPROLOL TARTRATE 5 MG/5ML IV SOLN: 2.5000 mg | INTRAVENOUS | Status: DC | PRN

## 2021-05-07 MED ORDER — DEXAMETHASONE SODIUM PHOSPHATE 4 MG/ML IJ SOLN
4.0000 mg | Freq: Once | INTRAMUSCULAR | Status: AC
  Administered 2021-05-07: 4 mg via INTRAVENOUS
  Filled 2021-05-07: qty 1

## 2021-05-07 MED ORDER — PLASMA-LYTE A IV SOLN: INTRAVENOUS | Status: DC | PRN

## 2021-05-07 MED ORDER — PHENYLEPHRINE 40 MCG/ML (10ML) SYRINGE FOR IV PUSH (FOR BLOOD PRESSURE SUPPORT)
PREFILLED_SYRINGE | INTRAVENOUS | Status: DC | PRN
  Administered 2021-05-07: 40 ug via INTRAVENOUS
  Administered 2021-05-07: 80 ug via INTRAVENOUS

## 2021-05-07 MED ORDER — NICARDIPINE HCL IN NACL 20-0.86 MG/200ML-% IV SOLN: 0.0000 mg/h | INTRAVENOUS | Status: DC

## 2021-05-07 MED ORDER — SODIUM CHLORIDE 0.9% FLUSH: 10.0000 mL | INTRAVENOUS | Status: DC | PRN

## 2021-05-07 MED ORDER — ALBUMIN HUMAN 5 % IV SOLN
250.0000 mL | INTRAVENOUS | Status: AC | PRN
  Filled 2021-05-07 (×3): qty 250

## 2021-05-07 MED ORDER — CHLORHEXIDINE GLUCONATE 0.12 % MT SOLN: 15.0000 mL | Freq: Once | OROMUCOSAL | Status: AC

## 2021-05-07 MED ORDER — DOCUSATE SODIUM 100 MG PO CAPS
200.0000 mg | ORAL_CAPSULE | Freq: Every day | ORAL | Status: DC
  Administered 2021-05-08 – 2021-05-12 (×3): 200 mg via ORAL
  Filled 2021-05-07 (×4): qty 2

## 2021-05-07 MED ORDER — ONDANSETRON HCL 4 MG/2ML IJ SOLN
4.0000 mg | Freq: Four times a day (QID) | INTRAMUSCULAR | Status: DC | PRN
  Administered 2021-05-08: 4 mg via INTRAVENOUS
  Filled 2021-05-07 (×2): qty 2

## 2021-05-07 MED ORDER — DEXTROSE 50 % IV SOLN: 0.0000 mL | INTRAVENOUS | Status: DC | PRN

## 2021-05-07 MED ORDER — ~~LOC~~ CARDIAC SURGERY, PATIENT & FAMILY EDUCATION
Freq: Once | Status: DC
  Filled 2021-05-07: qty 1

## 2021-05-07 MED ORDER — LACTATED RINGERS IV SOLN: INTRAVENOUS | Status: DC | PRN

## 2021-05-07 MED ORDER — OXYCODONE HCL 5 MG PO TABS: 5.0000 mg | ORAL_TABLET | ORAL | Status: DC | PRN

## 2021-05-07 MED ORDER — CHLORHEXIDINE GLUCONATE 4 % EX LIQD: 30.0000 mL | CUTANEOUS | Status: DC

## 2021-05-07 MED ORDER — CHLORHEXIDINE GLUCONATE 0.12 % MT SOLN
15.0000 mL | OROMUCOSAL | Status: AC
  Administered 2021-05-07: 15 mL via OROMUCOSAL

## 2021-05-07 MED ORDER — ACETAMINOPHEN 160 MG/5ML PO SOLN
1000.0000 mg | Freq: Four times a day (QID) | ORAL | Status: DC
  Administered 2021-05-07 – 2021-05-08 (×2): 1000 mg
  Filled 2021-05-07 (×2): qty 40.6

## 2021-05-07 MED ORDER — HEPARIN SODIUM (PORCINE) 1000 UNIT/ML IJ SOLN
INTRAMUSCULAR | Status: DC | PRN
  Administered 2021-05-07: 28000 [IU] via INTRAVENOUS

## 2021-05-07 MED ORDER — CHLORHEXIDINE GLUCONATE 0.12% ORAL RINSE (MEDLINE KIT)
15.0000 mL | Freq: Two times a day (BID) | OROMUCOSAL | Status: DC
  Administered 2021-05-07 – 2021-05-09 (×4): 15 mL via OROMUCOSAL

## 2021-05-07 MED ORDER — HEPARIN SODIUM (PORCINE) 1000 UNIT/ML IJ SOLN
INTRAMUSCULAR | Status: AC
  Filled 2021-05-07: qty 1

## 2021-05-07 MED ORDER — SODIUM CHLORIDE 0.9 % IV SOLN: 250.0000 mL | INTRAVENOUS | Status: DC

## 2021-05-07 MED ORDER — PROPOFOL 10 MG/ML IV BOLUS
INTRAVENOUS | Status: DC | PRN
  Administered 2021-05-07: 40 mg via INTRAVENOUS
  Administered 2021-05-07: 20 mg via INTRAVENOUS
  Administered 2021-05-07: 120 mg via INTRAVENOUS
  Administered 2021-05-07: 20 mg via INTRAVENOUS

## 2021-05-07 MED ORDER — FAMOTIDINE IN NACL 20-0.9 MG/50ML-% IV SOLN
20.0000 mg | Freq: Two times a day (BID) | INTRAVENOUS | Status: AC
  Administered 2021-05-07 (×2): 20 mg via INTRAVENOUS
  Filled 2021-05-07: qty 50

## 2021-05-07 MED ORDER — ALBUMIN HUMAN 5 % IV SOLN
INTRAVENOUS | Status: DC | PRN
  Administered 2021-05-07 (×4): 12.5 g via INTRAVENOUS

## 2021-05-07 MED ORDER — FAMOTIDINE IN NACL 20-0.9 MG/50ML-% IV SOLN
INTRAVENOUS | Status: AC
  Filled 2021-05-07: qty 50

## 2021-05-07 MED ORDER — ACETAMINOPHEN 650 MG RE SUPP
650.0000 mg | Freq: Once | RECTAL | Status: AC
  Administered 2021-05-07: 650 mg via RECTAL

## 2021-05-07 MED ORDER — INSULIN REGULAR(HUMAN) IN NACL 100-0.9 UT/100ML-% IV SOLN: INTRAVENOUS | Status: DC

## 2021-05-07 MED ORDER — PHENYLEPHRINE HCL-NACL 20-0.9 MG/250ML-% IV SOLN
0.0000 ug/min | INTRAVENOUS | Status: DC
  Administered 2021-05-08: 5 ug/min via INTRAVENOUS
  Filled 2021-05-07: qty 250

## 2021-05-07 MED ORDER — NOREPINEPHRINE 4 MG/250ML-% IV SOLN: 0.0000 ug/min | INTRAVENOUS | Status: DC

## 2021-05-07 MED ORDER — LACTATED RINGERS IV SOLN: INTRAVENOUS | Status: DC

## 2021-05-07 MED ORDER — CHLORHEXIDINE GLUCONATE CLOTH 2 % EX PADS
6.0000 | MEDICATED_PAD | Freq: Every day | CUTANEOUS | Status: DC
  Administered 2021-05-08 – 2021-05-09 (×2): 6 via TOPICAL

## 2021-05-07 MED ORDER — ROCURONIUM BROMIDE 10 MG/ML (PF) SYRINGE
PREFILLED_SYRINGE | INTRAVENOUS | Status: AC
  Filled 2021-05-07: qty 10

## 2021-05-07 MED ORDER — PROTAMINE SULFATE 10 MG/ML IV SOLN
INTRAVENOUS | Status: DC | PRN
  Administered 2021-05-07: 10 mg via INTRAVENOUS
  Administered 2021-05-07: 270 mg via INTRAVENOUS

## 2021-05-07 MED ORDER — PANTOPRAZOLE SODIUM 40 MG PO TBEC
40.0000 mg | DELAYED_RELEASE_TABLET | Freq: Every day | ORAL | Status: DC
  Administered 2021-05-09 – 2021-05-13 (×5): 40 mg via ORAL
  Filled 2021-05-07 (×5): qty 1

## 2021-05-07 MED ORDER — ASPIRIN EC 325 MG PO TBEC
325.0000 mg | DELAYED_RELEASE_TABLET | Freq: Every day | ORAL | Status: DC
  Administered 2021-05-08 – 2021-05-13 (×6): 325 mg via ORAL
  Filled 2021-05-07 (×6): qty 1

## 2021-05-07 MED ORDER — BISACODYL 10 MG RE SUPP: 10.0000 mg | Freq: Every day | RECTAL | Status: DC

## 2021-05-07 MED ORDER — MIDAZOLAM HCL 2 MG/2ML IJ SOLN: 2.0000 mg | INTRAMUSCULAR | Status: DC | PRN

## 2021-05-07 MED ORDER — CEFAZOLIN SODIUM-DEXTROSE 2-4 GM/100ML-% IV SOLN
2.0000 g | Freq: Three times a day (TID) | INTRAVENOUS | Status: AC
  Administered 2021-05-07 – 2021-05-09 (×6): 2 g via INTRAVENOUS
  Filled 2021-05-07 (×6): qty 100

## 2021-05-07 MED ORDER — SODIUM CHLORIDE 0.9% FLUSH
3.0000 mL | Freq: Two times a day (BID) | INTRAVENOUS | Status: DC
  Administered 2021-05-08: 3 mL via INTRAVENOUS

## 2021-05-07 MED ORDER — SODIUM CHLORIDE 0.9% FLUSH: 3.0000 mL | INTRAVENOUS | Status: DC | PRN

## 2021-05-07 MED ORDER — METOPROLOL TARTRATE 25 MG/10 ML ORAL SUSPENSION: 12.5000 mg | Freq: Two times a day (BID) | ORAL | Status: DC

## 2021-05-07 MED ORDER — DEXMEDETOMIDINE HCL IN NACL 400 MCG/100ML IV SOLN
0.0000 ug/kg/h | INTRAVENOUS | Status: DC
  Administered 2021-05-07: 0.5 ug/kg/h via INTRAVENOUS
  Filled 2021-05-07: qty 200

## 2021-05-07 MED ORDER — ORAL CARE MOUTH RINSE
15.0000 mL | OROMUCOSAL | Status: DC
  Administered 2021-05-07 – 2021-05-08 (×8): 15 mL via OROMUCOSAL

## 2021-05-07 MED ORDER — NITROGLYCERIN IN D5W 200-5 MCG/ML-% IV SOLN: 0.0000 ug/min | INTRAVENOUS | Status: DC

## 2021-05-07 MED ORDER — CHLORHEXIDINE GLUCONATE 0.12 % MT SOLN
OROMUCOSAL | Status: AC
  Administered 2021-05-07: 15 mL via OROMUCOSAL
  Filled 2021-05-07: qty 15

## 2021-05-07 MED ORDER — MAGNESIUM SULFATE 4 GM/100ML IV SOLN
4.0000 g | Freq: Once | INTRAVENOUS | Status: AC
  Administered 2021-05-07: 4 g via INTRAVENOUS
  Filled 2021-05-07: qty 100

## 2021-05-07 MED ORDER — PROPOFOL 10 MG/ML IV BOLUS
INTRAVENOUS | Status: AC
  Filled 2021-05-07: qty 20

## 2021-05-07 MED ORDER — PROTAMINE SULFATE 10 MG/ML IV SOLN
INTRAVENOUS | Status: AC
  Filled 2021-05-07: qty 25

## 2021-05-07 MED ORDER — ACETAMINOPHEN 500 MG PO TABS
1000.0000 mg | ORAL_TABLET | Freq: Four times a day (QID) | ORAL | Status: AC
  Administered 2021-05-08 – 2021-05-12 (×12): 1000 mg via ORAL
  Filled 2021-05-07 (×14): qty 2

## 2021-05-07 MED ORDER — METOPROLOL TARTRATE 12.5 MG HALF TABLET: 12.5000 mg | ORAL_TABLET | Freq: Once | ORAL | Status: DC

## 2021-05-07 MED ORDER — PROTAMINE SULFATE 10 MG/ML IV SOLN
INTRAVENOUS | Status: AC
  Filled 2021-05-07: qty 5

## 2021-05-07 MED ORDER — PHENYLEPHRINE 40 MCG/ML (10ML) SYRINGE FOR IV PUSH (FOR BLOOD PRESSURE SUPPORT)
PREFILLED_SYRINGE | INTRAVENOUS | Status: AC
  Filled 2021-05-07: qty 10

## 2021-05-07 MED ORDER — FENTANYL CITRATE (PF) 250 MCG/5ML IJ SOLN
INTRAMUSCULAR | Status: DC | PRN
  Administered 2021-05-07 (×2): 100 ug via INTRAVENOUS
  Administered 2021-05-07: 250 ug via INTRAVENOUS
  Administered 2021-05-07: 50 ug via INTRAVENOUS
  Administered 2021-05-07: 100 ug via INTRAVENOUS
  Administered 2021-05-07: 150 ug via INTRAVENOUS
  Administered 2021-05-07: 100 ug via INTRAVENOUS
  Administered 2021-05-07: 50 ug via INTRAVENOUS
  Administered 2021-05-07: 100 ug via INTRAVENOUS

## 2021-05-07 SURGICAL SUPPLY — 91 items
BAG DECANTER FOR FLEXI CONT (MISCELLANEOUS) ×4 IMPLANT
BINDER BREAST XXLRG (GAUZE/BANDAGES/DRESSINGS) ×1 IMPLANT
BLADE CLIPPER SURG (BLADE) ×4 IMPLANT
BLADE STERNUM SYSTEM 6 (BLADE) ×4 IMPLANT
BNDG ELASTIC 4X5.8 VLCR STR LF (GAUZE/BANDAGES/DRESSINGS) ×4 IMPLANT
BNDG ELASTIC 6X10 VLCR STRL LF (GAUZE/BANDAGES/DRESSINGS) ×1 IMPLANT
BNDG ELASTIC 6X5.8 VLCR STR LF (GAUZE/BANDAGES/DRESSINGS) ×4 IMPLANT
BNDG GAUZE ELAST 4 BULKY (GAUZE/BANDAGES/DRESSINGS) ×4 IMPLANT
CABLE SURGICAL S-101-97-12 (CABLE) ×4 IMPLANT
CANISTER SUCT 3000ML PPV (MISCELLANEOUS) ×4 IMPLANT
CANNULA MC2 2 STG 29/37 NON-V (CANNULA) ×3 IMPLANT
CANNULA MC2 TWO STAGE (CANNULA) ×1
CANNULA NON VENT 22FR 12 (CANNULA) ×1 IMPLANT
CATH ROBINSON RED A/P 18FR (CATHETERS) ×8 IMPLANT
CLIP FOGARTY SPRING 6M (CLIP) ×1 IMPLANT
CLIP RETRACTION 3.0MM CORONARY (MISCELLANEOUS) ×3 IMPLANT
CLIP VESOCCLUDE MED 24/CT (CLIP) IMPLANT
CLIP VESOCCLUDE SM WIDE 24/CT (CLIP) IMPLANT
CONN ST 1/2X1/2  BEN (MISCELLANEOUS) ×1
CONN ST 1/2X1/2 BEN (MISCELLANEOUS) ×3 IMPLANT
CONNECTOR BLAKE 2:1 CARIO BLK (MISCELLANEOUS) ×4 IMPLANT
CONTAINER PROTECT SURGISLUSH (MISCELLANEOUS) ×8 IMPLANT
DERMABOND ADVANCED (GAUZE/BANDAGES/DRESSINGS) ×1
DERMABOND ADVANCED .7 DNX12 (GAUZE/BANDAGES/DRESSINGS) IMPLANT
DRAIN CHANNEL 19F RND (DRAIN) ×12 IMPLANT
DRAIN CONNECTOR BLAKE 1:1 (MISCELLANEOUS) ×4 IMPLANT
DRAPE CARDIOVASCULAR INCISE (DRAPES) ×1
DRAPE INCISE IOBAN 66X45 STRL (DRAPES) IMPLANT
DRAPE SRG 135X102X78XABS (DRAPES) ×3 IMPLANT
DRAPE WARM FLUID 44X44 (DRAPES) ×4 IMPLANT
DRSG AQUACEL AG ADV 3.5X10 (GAUZE/BANDAGES/DRESSINGS) ×4 IMPLANT
DRSG COVADERM 4X14 (GAUZE/BANDAGES/DRESSINGS) ×4 IMPLANT
ELECT BLADE 4.0 EZ CLEAN MEGAD (MISCELLANEOUS) ×4
ELECT REM PT RETURN 9FT ADLT (ELECTROSURGICAL) ×8
ELECTRODE BLDE 4.0 EZ CLN MEGD (MISCELLANEOUS) ×3 IMPLANT
ELECTRODE REM PT RTRN 9FT ADLT (ELECTROSURGICAL) ×6 IMPLANT
FELT TEFLON 1X6 (MISCELLANEOUS) ×8 IMPLANT
GAUZE SPONGE 4X4 12PLY STRL (GAUZE/BANDAGES/DRESSINGS) ×9 IMPLANT
GLOVE SURG ENC MOIS LTX SZ7 (GLOVE) ×8 IMPLANT
GLOVE SURG ENC TEXT LTX SZ7.5 (GLOVE) ×8 IMPLANT
GLOVE SURG MICRO LTX SZ6 (GLOVE) ×1 IMPLANT
GLOVE SURG MICRO LTX SZ7.5 (GLOVE) ×1 IMPLANT
GLOVE SURG PR MICRO ENCORE 7.5 (GLOVE) ×2 IMPLANT
GOWN STRL REUS W/ TWL LRG LVL3 (GOWN DISPOSABLE) ×12 IMPLANT
GOWN STRL REUS W/ TWL XL LVL3 (GOWN DISPOSABLE) ×6 IMPLANT
GOWN STRL REUS W/TWL LRG LVL3 (GOWN DISPOSABLE) ×6
GOWN STRL REUS W/TWL XL LVL3 (GOWN DISPOSABLE) ×2
HEMOSTAT POWDER SURGIFOAM 1G (HEMOSTASIS) ×12 IMPLANT
INSERT SUTURE HOLDER (MISCELLANEOUS) ×5 IMPLANT
KIT BASIN OR (CUSTOM PROCEDURE TRAY) ×4 IMPLANT
KIT SUCTION CATH 14FR (SUCTIONS) ×4 IMPLANT
KIT TURNOVER KIT B (KITS) ×4 IMPLANT
KIT VASOVIEW HEMOPRO 2 VH 4000 (KITS) ×4 IMPLANT
LEAD PACING MYOCARDI (MISCELLANEOUS) ×4 IMPLANT
MARKER GRAFT CORONARY BYPASS (MISCELLANEOUS) ×12 IMPLANT
NS IRRIG 1000ML POUR BTL (IV SOLUTION) ×21 IMPLANT
PACK ACCESSORY CANNULA KIT (KITS) ×4 IMPLANT
PACK E OPEN HEART (SUTURE) ×4 IMPLANT
PACK OPEN HEART (CUSTOM PROCEDURE TRAY) ×4 IMPLANT
PAD ARMBOARD 7.5X6 YLW CONV (MISCELLANEOUS) ×8 IMPLANT
PAD ELECT DEFIB RADIOL ZOLL (MISCELLANEOUS) ×4 IMPLANT
PENCIL BUTTON HOLSTER BLD 10FT (ELECTRODE) ×4 IMPLANT
POSITIONER HEAD DONUT 9IN (MISCELLANEOUS) ×4 IMPLANT
PUNCH AORTIC ROTATE 4.0MM (MISCELLANEOUS) ×4 IMPLANT
SET MPS 3-ND DEL (MISCELLANEOUS) ×1 IMPLANT
SUPPORT HEART JANKE-BARRON (MISCELLANEOUS) ×4 IMPLANT
SUT BONE WAX W31G (SUTURE) ×4 IMPLANT
SUT ETHIBOND X763 2 0 SH 1 (SUTURE) ×8 IMPLANT
SUT MNCRL AB 3-0 PS2 18 (SUTURE) ×8 IMPLANT
SUT PDS AB 1 CTX 36 (SUTURE) ×8 IMPLANT
SUT PROLENE 4 0 RB 1 (SUTURE) ×3
SUT PROLENE 4 0 SH DA (SUTURE) ×6 IMPLANT
SUT PROLENE 4-0 RB1 .5 CRCL 36 (SUTURE) IMPLANT
SUT PROLENE 5 0 C 1 36 (SUTURE) ×13 IMPLANT
SUT PROLENE 7 0 BV1 MDA (SUTURE) ×5 IMPLANT
SUT STEEL 6MS V (SUTURE) ×8 IMPLANT
SUT VIC AB 1 CTX 36 (SUTURE) ×1
SUT VIC AB 1 CTX36XBRD ANBCTR (SUTURE) IMPLANT
SUT VIC AB 2-0 CT1 27 (SUTURE) ×1
SUT VIC AB 2-0 CT1 TAPERPNT 27 (SUTURE) IMPLANT
SUT VIC AB 2-0 CTX 36 (SUTURE) ×1 IMPLANT
SUT VIC AB 3-0 X1 27 (SUTURE) ×2 IMPLANT
SYSTEM SAHARA CHEST DRAIN ATS (WOUND CARE) ×4 IMPLANT
TAPE CLOTH SURG 4X10 WHT LF (GAUZE/BANDAGES/DRESSINGS) ×2 IMPLANT
TAPE PAPER 2X10 WHT MICROPORE (GAUZE/BANDAGES/DRESSINGS) ×1 IMPLANT
TOWEL GREEN STERILE (TOWEL DISPOSABLE) ×4 IMPLANT
TOWEL GREEN STERILE FF (TOWEL DISPOSABLE) ×4 IMPLANT
TRAY FOLEY SLVR 16FR TEMP STAT (SET/KITS/TRAYS/PACK) ×4 IMPLANT
TUBING LAP HI FLOW INSUFFLATIO (TUBING) ×4 IMPLANT
UNDERPAD 30X36 HEAVY ABSORB (UNDERPADS AND DIAPERS) ×4 IMPLANT
WATER STERILE IRR 1000ML POUR (IV SOLUTION) ×8 IMPLANT

## 2021-05-07 NOTE — Progress Notes (Addendum)
Patient progressed through weaning protocol without any significant issues noted. ABG obtained and within protocol limits to extubate. However, no cuff leak around ETT was noted. Lightfoot MD called and came to bedside. Orders received to give an amp of bicarb and another albumin. He will discuss steroid dosing with pharmacy and place orders. Will give steroids, wait one hour and extubate.

## 2021-05-07 NOTE — Progress Notes (Signed)
Notified Lightfoot MD of patient's ABG results post-OR. Orders received to increase RR on the ventilator to 18 and to give one amp bicarb. RRT notified.

## 2021-05-07 NOTE — Progress Notes (Signed)
Patients airway examined with nurse to evaluate for cuff leak. No leak heard  from around ETT tube and patient returning her set tidal volumes. Patients tongue and lower lip is swollen. No cuff leak at this time, doctor notified and ordered to leave patient intubated over night.

## 2021-05-07 NOTE — Brief Op Note (Signed)
05/07/2021  6:54 AM  PATIENT:  [name]  71 y.o. female  PRE-OPERATIVE DIAGNOSIS:  CORONARY ARTERY DISEASE  POST-OPERATIVE DIAGNOSIS:  CORONARY ARTERY DISEASE  PROCEDURE:  Procedure(s): CORONARY ARTERY BYPASS GRAFTING (CABG) X 4, USING LEFT INTERNAL MAMMARY ARTERY, LEFT LEG GREATER SAPHENOUS VEIN HARVESTED ENDOSCOPICALLY (N/A) TRANSESOPHAGEAL ECHOCARDIOGRAM (TEE) (N/A) APPLICATION OF CELL SAVER (N/A) ENDOVEIN HARVEST OF GREATER SAPHENOUS VEIN (Left) LIMA-LAD SVG-PD SVG-OM1 SVG-OM3 EVH 50/15 SURGEON:  Surgeon(s) and Role:    * Lightfoot, Lucile Crater, MD - Primary    * Dahlia Byes, MD - Assisting  PHYSICIAN ASSISTANT: Eula Jaster PA-C  ASSISTANTS:STAFF   ANESTHESIA:   general  EBL:  see anest/perfusion records  BLOOD ADMINISTERED:none  DRAINS:  LEFT PLEURAL AND MEDIASTINAL CHEST DRAINS    LOCAL MEDICATIONS USED:  NONE  SPECIMEN:  No Specimen  DISPOSITION OF SPECIMEN:  N/A  COUNTS:  YES  TOURNIQUET:  * No tourniquets in log *  DICTATION: .Dragon Dictation  PLAN OF CARE: Admit to inpatient   PATIENT DISPOSITION:  ICU - intubated and hemodynamically stable.   Delay start of Pharmacological VTE agent (>24hrs) due to surgical blood loss or risk of bleeding: yes  COMPLICATIONS: NO KNOWN

## 2021-05-07 NOTE — Transfer of Care (Signed)
Immediate Anesthesia Transfer of Care Note  Patient: Susan Weeks  Procedure(s) Performed: CORONARY ARTERY BYPASS GRAFTING (CABG) X 4, USING LEFT INTERNAL MAMMARY ARTERY, LEFT LEG GREATER SAPHENOUS VEIN HARVESTED ENDOSCOPICALLY (Chest) TRANSESOPHAGEAL ECHOCARDIOGRAM (TEE) APPLICATION OF CELL SAVER ENDOVEIN HARVEST OF GREATER SAPHENOUS VEIN (Left: Leg Lower)  Patient Location: SICU  Anesthesia Type:General  Level of Consciousness: sedated and Patient remains intubated per anesthesia plan  Airway & Oxygen Therapy: Patient remains intubated per anesthesia plan and Patient placed on Ventilator (see vital sign flow sheet for setting)  Post-op Assessment: Report given to RN and Post -op Vital signs reviewed and stable  Post vital signs: Reviewed and stable  Last Vitals:  Vitals Value Taken Time  BP    Temp    Pulse    Resp    SpO2      Last Pain:  Vitals:   05/07/21 0608  TempSrc: Oral  PainSc:          Complications: No notable events documented.

## 2021-05-07 NOTE — Progress Notes (Signed)
SICU wean initiated

## 2021-05-07 NOTE — Interval H&P Note (Signed)
History and Physical Interval Note:  05/07/2021 7:31 AM  Susan Weeks  has presented today for surgery, with the diagnosis of CAD.  The various methods of treatment have been discussed with the patient and family. After consideration of risks, benefits and other options for treatment, the patient has consented to  Procedure(s): CORONARY ARTERY BYPASS GRAFTING (CABG) (N/A) TRANSESOPHAGEAL ECHOCARDIOGRAM (TEE) (N/A) as a surgical intervention.  The patient's history has been reviewed, patient examined, no change in status, stable for surgery.  I have reviewed the patient's chart and labs.  Questions were answered to the patient's satisfaction.     Nuri Branca Bary Leriche

## 2021-05-07 NOTE — Discharge Summary (Signed)
[address]       [place] [address]             [phone]    Physician Discharge Summary  [name]: Susan Weeks MRN: [id number] DOB/AGE: 72-Aug-1952 72 y.o.  Admit date: 05/07/2021 Discharge date: 05/13/2021  Admission Diagnoses:  Patient Active Problem List   Diagnosis Date Noted   Abnormal computed tomography angiography (CTA)    Stable angina (HCC)    SOB (shortness of breath) on exertion 03/12/2021   Allergic reaction 08/20/2020   Skin lesion 07/09/2020   Pelvic mass 07/09/2020   SOB (shortness of breath) 07/04/2020   Elevated hemoglobin (HCC) 06/08/2019   Toe infection 09/26/2018   Erythrocytosis 01/24/2018   Sleep apnea 01/24/2018   Hyperglycemia 02/25/2015   Health care maintenance 08/23/2014   Rash and nonspecific skin eruption 06/12/2014   Thigh cramp 02/12/2014   Pain of right clavicle 04/10/2013   History of colon cancer 02/15/2013   Essential hypertension, benign 07/11/2012   Pure hypercholesterolemia 07/11/2012   Abnormal liver function 07/11/2012   Leukopenia 07/11/2012   Discharge Diagnoses:  Patient Active Problem List   Diagnosis Date Noted   S/P CABG x 4 05/07/2021   Abnormal computed tomography angiography (CTA)    Stable angina (HCC)    SOB (shortness of breath) on exertion 03/12/2021   Allergic reaction 08/20/2020   Skin lesion 07/09/2020   Pelvic mass 07/09/2020   SOB (shortness of breath) 07/04/2020   Elevated hemoglobin (HCC) 06/08/2019   Toe infection 09/26/2018   Erythrocytosis 01/24/2018   Sleep apnea 01/24/2018   Hyperglycemia 02/25/2015   Health care maintenance 08/23/2014   Rash and nonspecific skin eruption 06/12/2014   Thigh cramp 02/12/2014   Pain of right clavicle 04/10/2013   History of colon cancer 02/15/2013   Essential hypertension, benign 07/11/2012   Pure hypercholesterolemia 07/11/2012   Abnormal liver function 07/11/2012   Leukopenia 07/11/2012   Discharged Condition:  stable  History of Present Illness:     Susan Weeks is a 71 y.o. female who was referred by Dr. [name] for evaluation of exertional dyspnea.  This all began over the summer.  Most notably she had symptoms when she was ambulating a few weeks ago with her sister.  She subsequently was seen by Dr. [name] where she underwent a left heart catheterization which showed three-vessel disease.  She has no prior cardiac history. She has known history of essential hypertension, hyperlipidemia, erythrocytosis thought to be due to untreated sleep apnea which improved after starting CPAP treatment and adenocarcinoma of the cecum status post partial colon resection.  The patient and all relevant studies were reviewed by Dr. Kipp Brood who recommended proceeding with elective admission for CABG.  Hospital course: The patient was admitted electively and on 05/07/2021 taken to the operating room at which time he underwent CABG x4.  She tolerated procedure well was taken to the surgical intensive care unit in stable condition.  The patient was weaned and extubated on POD #1.  She was hypotensive and required support with Neo-synephrine.  This was weaned as tolerated. She was treated with additional fluid to help with Neo wean.  The patient was maintaining NSR and was transitioned off Amiodarone drip to an oral regimen of the medication.  Her pacing wires and chest tubes were removed without difficulty.  She was treated with Lasix for volume overloaded state.  She was felt stable for transfer to the progressive care unit on  05/09/2021.She has continued to progress. She is noted to  have a moderate left pleural effusion and thoracentesis was done, yielding 300 cc of fluid.She has a stable expected acute blood loss anemia but with further decrease, she did require a transfusion on 01/06. Incisions are healing well without signs of infection. Amlodipine was restarted for better BP control on 01/08. Of note, she has allergy to ACE/ARB.  She was was felt surgically stable for discharge 01/09.    Consults: None  Significant Diagnostic Studies: angiography:     Mid LAD lesion is 100% stenosed.   1st Diag lesion is 60% stenosed.   3rd Mrg lesion is 60% stenosed.   LPAV lesion is 100% stenosed.   Prox RCA lesion is 90% stenosed.   1.  Codominant coronary arteries with significant three-vessel coronary artery disease.  There is chronic total occlusion of the mid LAD with left to left and right to left collaterals.  In addition, the AV groove left circumflex is occluded distally and the right coronary artery has 90% proximal stenosis. 2.  Left ventricular angiography was not diagnostic due to the catheter moving into the ascending aorta with injection.  LVEDP was mildly elevated. 3.  Attempted unsuccessful angioplasty of the mid LAD due to inability to cross the occlusion.   Recommendations: The plan was to do PCI on the LAD if able to cross and then stage the right coronary artery.  However, I was not able to cross the mid LAD occlusion.  Recommend evaluation for CABG.  The patient is scheduled for an echocardiogram on the 16th.  I added metoprolol 25 mg twice daily.  Treatments:  CABG X 4.  LIMA to LAD.  Reverse saphenous vein graft to PDA, obtuse marginal 1 and obtuse marginal #3. Endoscopic greater saphenous vein harvest on the right by Dr. Kipp Brood on 05/07/2021.   Discharge Exam: Blood pressure (!) 149/85, pulse 85, temperature 98.1 F (36.7 C), temperature source Oral, resp. rate 18, height 5\' 3"  (1.6 m), weight 86.6 kg, last menstrual period 06/28/1982, SpO2 93 %.  General appearance: alert, cooperative, and no distress Heart: regular rate and rhythm Lungs: clear to auscultation bilaterally Abdomen: soft, non-tender; bowel sounds normal; no masses,  no organomegaly Extremities: edema trace-1+ Wound: clean and dry   Discharge Medications:  The patient has been discharged on:   1.Beta Blocker:  Yes [  X ]                               No   [   ]                              If No, reason:  2.Ace Inhibitor/ARB: Yes [   ]                                     No  [ X   ]                                     If No, reason: allergy  3.Statin:   Yes [ X  ]                  No  [   ]  If No, reason:  4.Ecasa:  Yes  Valu.Nieves  ]                  No   [   ]                  If No, reason:  Patient had ACS upon admission:  Plavix/P2Y12 inhibitor: Yes [   ]                                      No  [ x  ]     Discharge Instructions     Amb Referral to Cardiac Rehabilitation   Complete by: As directed    Diagnosis: CABG   CABG X ___: 4   After initial evaluation and assessments completed: Virtual Based Care may be provided alone or in conjunction with Phase 2 Cardiac Rehab based on patient barriers.: Yes      Allergies as of 05/13/2021       Reactions   Tape    Bruising from paper tape   Lisinopril Rash   Developed slight facial swelling and itching over forehead while taking. Unclear if definite ACE allergy, but concern enough to stop in ER today.   Z-pak [azithromycin] Nausea And Vomiting   Caused Emesis        Medication List     STOP taking these medications    hydrochlorothiazide 12.5 MG capsule Commonly known as: MICROZIDE       TAKE these medications    acetaminophen 500 MG tablet Commonly known as: TYLENOL Take 500 mg by mouth every 6 (six) hours as needed for moderate pain.   amiodarone 200 MG tablet Commonly known as: PACERONE Take 1 tablet (200 mg total) by mouth 2 (two) times daily. X 7 days, then decrease to 200 mg daily   amLODipine 5 MG tablet Commonly known as: NORVASC TAKE 1 TABLET BY MOUTH EVERY DAY   aspirin 325 MG EC tablet Take 1 tablet (325 mg total) by mouth daily. What changed:  medication strength how much to take   CALCIUM 600+D PO Take 1 tablet by mouth daily.   furosemide 40 MG tablet Commonly known as: LASIX Take 1  tablet (40 mg total) by mouth daily. Start taking on: May 14, 2021   metoprolol tartrate 25 MG tablet Commonly known as: LOPRESSOR Take 1 tablet (25 mg total) by mouth 2 (two) times daily.   NON FORMULARY Pt uses a cpap nightly   oxyCODONE 5 MG immediate release tablet Commonly known as: Oxy IR/ROXICODONE Take 1 tablet (5 mg total) by mouth every 4 (four) hours as needed for up to 7 days for severe pain.   potassium chloride SA 20 MEQ tablet Commonly known as: KLOR-CON M Take 1 tablet (20 mEq total) by mouth daily. Start taking on: May 14, 2021   rosuvastatin 5 MG tablet Commonly known as: CRESTOR TAKE 1 TABLET BY MOUTH EVERY DAY   vitamin E 180 MG (400 UNITS) capsule Take 400 Units by mouth daily.        Follow-up Information     Lajuana Matte, MD Follow up on 05/24/2021.   Specialty: Cardiothoracic Surgery Why: Appointment is at virtual visit.  Dr. Kipp Brood will call you.  Your appointment time is at 1:30 Contact information: [address] [place] [address] [phone]  Theora Gianotti, NP. Go on 06/11/2021.   Specialties: Nurse Practitioner, Cardiology, Radiology Why: Appointment time is at 8:50 am Contact information: [address] [place] [address] [phone]                 Signed:  Ellwood Handler, PA-C 05/13/2021, 7:21 AM

## 2021-05-07 NOTE — Progress Notes (Signed)
Radiology called in regards to ETT needing to be withdrawn. RRT aware.

## 2021-05-07 NOTE — Anesthesia Postprocedure Evaluation (Signed)
Anesthesia Post Note  Patient: TIAJUANA LEPPANEN  Procedure(s) Performed: CORONARY ARTERY BYPASS GRAFTING (CABG) X 4, USING LEFT INTERNAL MAMMARY ARTERY, LEFT LEG GREATER SAPHENOUS VEIN HARVESTED ENDOSCOPICALLY (Chest) TRANSESOPHAGEAL ECHOCARDIOGRAM (TEE) APPLICATION OF CELL SAVER ENDOVEIN HARVEST OF GREATER SAPHENOUS VEIN (Left: Leg Lower)     Patient location during evaluation: SICU Anesthesia Type: General Level of consciousness: patient remains intubated per anesthesia plan Pain management: pain level controlled Vital Signs Assessment: post-procedure vital signs reviewed and stable Respiratory status: patient remains intubated per anesthesia plan Cardiovascular status: blood pressure returned to baseline and stable Postop Assessment: no apparent nausea or vomiting Anesthetic complications: no   No notable events documented.  Last Vitals:  Vitals:   05/07/21 1800 05/07/21 1815  BP:    Pulse: 77 76  Resp: (!) 26 (!) 30  Temp: 37.2 C 37.2 C  SpO2: 100% 100%    Last Pain:  Vitals:   05/07/21 1600  TempSrc: Bladder  PainSc:                  Kyo Cocuzza

## 2021-05-07 NOTE — Anesthesia Preprocedure Evaluation (Addendum)
Anesthesia Evaluation  Patient identified by MRN, date of birth, ID band Patient awake    Reviewed: Allergy & Precautions, NPO status , Patient's Chart, lab work & pertinent test results  Airway Mallampati: II  TM Distance: >3 FB     Dental   Pulmonary sleep apnea , former smoker,    breath sounds clear to auscultation       Cardiovascular hypertension, + angina + CAD   Rhythm:Regular Rate:Normal     Neuro/Psych    GI/Hepatic Neg liver ROS,   Endo/Other  negative endocrine ROS  Renal/GU negative Renal ROS     Musculoskeletal   Abdominal   Peds  Hematology   Anesthesia Other Findings   Reproductive/Obstetrics                            Anesthesia Physical Anesthesia Plan  ASA: 3  Anesthesia Plan: General   Post-op Pain Management:    Induction: Intravenous  PONV Risk Score and Plan: Treatment may vary due to age or medical condition, Ondansetron and Midazolam  Airway Management Planned: Oral ETT  Additional Equipment:   Intra-op Plan:   Post-operative Plan: Post-operative intubation/ventilation  Informed Consent: I have reviewed the patients History and Physical, chart, labs and discussed the procedure including the risks, benefits and alternatives for the proposed anesthesia with the patient or authorized representative who has indicated his/her understanding and acceptance.     Dental advisory given  Plan Discussed with: CRNA and Anesthesiologist  Anesthesia Plan Comments:        Anesthesia Quick Evaluation

## 2021-05-07 NOTE — Anesthesia Procedure Notes (Addendum)
°  Central Venous Catheter Insertion Performed by: Belinda Block, MD, anesthesiologist Start/End1/07/2021 7:00 AM, 05/07/2021 7:15 AM Patient location: Pre-op. Preanesthetic checklist: patient identified, IV checked, site marked, risks and benefits discussed, surgical consent, monitors and equipment checked, pre-op evaluation, timeout performed and anesthesia consent Position: Trendelenburg Lidocaine 1% used for infiltration and patient sedated Hand hygiene performed  and maximum sterile barriers used  Catheter size: 8.5 Fr Sheath introducer Procedure performed using ultrasound guided technique. Ultrasound Notes:anatomy identified, needle tip was noted to be adjacent to the nerve/plexus identified, no ultrasound evidence of intravascular and/or intraneural injection and image(s) printed for medical record Attempts: 1 Following insertion, line sutured and dressing applied. Post procedure assessment: blood return through all ports, free fluid flow and no air  Patient tolerated the procedure well with no immediate complications. Additional procedure comments: Flow track inserted through introducer port without difficulty. Dr. Nyoka Cowden.

## 2021-05-07 NOTE — Progress Notes (Signed)
Rec'd order to withdraw ETT 3 cm (per c-xray report).  Pt tol well, + [facility] coarse diminished t/o.  Sat 99%

## 2021-05-07 NOTE — Anesthesia Procedure Notes (Signed)
Arterial Line Insertion Start/End1/07/2021 7:05 AM, 05/07/2021 7:10 AM Performed by: Betha Loa, CRNA, CRNA  Patient location: Pre-op. Preanesthetic checklist: patient identified, IV checked, site marked, risks and benefits discussed, surgical consent, monitors and equipment checked, pre-op evaluation, timeout performed and anesthesia consent Lidocaine 1% used for infiltration and patient sedated Left, radial was placed Catheter size: 20 G Hand hygiene performed  and maximum sterile barriers used   Attempts: 1 Procedure performed without using ultrasound guided technique. Following insertion, dressing applied and Biopatch. Post procedure assessment: normal  Patient tolerated the procedure well with no immediate complications.

## 2021-05-07 NOTE — Op Note (Signed)
[address]       [place] [address]             [phone]                                          05/07/2021 Patient:  Susan Weeks Pre-Op Dx: Coronary artery disease   Hypertension   Hyperlipidemia Post-op Dx: Same Procedure: CABG X 4.  LIMA to LAD.  Reverse saphenous vein graft to PDA, obtuse marginal 1 and obtuse marginal #3. Endoscopic greater saphenous vein harvest on the right   Surgeon and Role:      * Camisha Srey, Lucile Crater, MD - Primary    Evonnie Pat, PA-C- assisting An experienced assistant was required given the complexity of this surgery and the standard of surgical care. The assistant was needed for exposure, dissection, suctioning, retraction of delicate tissues and sutures, instrument exchange and for overall help during this procedure.    Anesthesia  general EBL: 500 ml Blood Administration: None Xclamp Time:  59 min Pump Time:  28min  Drains: 31 F blake drain: L, mediastinal  Wires: Ventricular Counts: correct   Indications: 71 year old female with severe three-vessel coronary artery disease.  I personally reviewed her left heart cath.  The LAD fills from right to left collaterals.  The circumflex does not have much disease however posterior lateral branch, and the left posterior atrioventricular branch fill from collaterals.  There is also a 90% stenosis in the RCA.  Otherwise the right-sided vessels free from much disease.  I also reviewed the echocardiogram.  She has preserved biventricular function.  She does have mild to moderate mitral valve regurgitation but the jet is mostly central.  We discussed the risks and benefits of surgical revascularization and she is agreeable to proceed.    Findings: Good LIMA, good vein conduit.  The LAD was adequate in size.  The obtuse marginals are small.  The PDA was a good quality target.  Operative Technique: All invasive lines were placed in pre-op holding.  After the risks, benefits and  alternatives were thoroughly discussed, the patient was brought to the operative theatre.  Anesthesia was induced, and the patient was prepped and draped in normal sterile fashion.  An appropriate surgical pause was performed, and pre-operative antibiotics were dosed accordingly.  We began with simultaneous incisions along the right leg for harvesting of the greater saphenous vein and the chest for the sternotomy.  In regards to the sternotomy, this was carried down with bovie cautery, and the sternum was divided with a reciprocating saw.  Meticulous hemostasis was obtained.  The left internal thoracic artery was exposed and harvested in in pedicled fashion.  The patient was systemically heparinized, and the artery was divided distally, and placed in a papaverine sponge.    The sternal elevator was removed, and a retractor was placed.  The pericardium was divided in the midline and fashioned into a cradle with pericardial stitches.   After we confirmed an appropriate ACT, the ascending aorta was cannulated in standard fashion.  The right atrial appendage was used for venous cannulation site.  Cardiopulmonary bypass was initiated, and the heart retractor was placed. The cross clamp was applied, and a dose of anterograde cardioplegia was given with good arrest of the heart.  We moved to the posterior wall of the heart, and found a  good target on the PDA.  An arteriotomy was made, and the vein graft was anastomosed to it in an end to side fashion.  Next we exposed the lateral wall, and found a good target on the obtuse marginal #3.  An end to side anastomosis with the vein graft was then created.  Next, we exposed another good target on the lateral wall.   An arteriotomy was created.  The vein was anastomosed in an end to side fashion.  Finally, we exposed a good target on the  LAD, and fashioned an end to side anastomosis between it and the LITA.  We began to re-warm, and a re-animation dose of cardioplegia was  given.  The heart was de-aired, and the cross clamp was removed.  Meticulous hemostasis was obtained.    A partial occludding clamp was then placed on the ascending aorta, and we created an end to side anastomosis between it and the proximal vein grafts.  Rings were placed on the proximal anastomosis.  Hemostasis was obtained, and we separated from cardiopulmonary bypass without event.  The heparin was reversed with protamine.  Chest tubes and wires were placed, and the sternum was re-approximated with sternal wires.  The soft tissue and skin were re-approximated wth absorbable suture.    The patient tolerated the procedure without any immediate complications, and was transferred to the ICU in guarded condition.  Hughey Rittenberry Bary Leriche

## 2021-05-07 NOTE — Progress Notes (Signed)
Sat was 91-92% once pt in room s/p OR.  [facility] w/ diminished breath sounds over left side.  CRNA assessed airway and requested recruitment maneuver.  Pt tol recruitment maneuver well, sat 100% s/p recruitment maneuver.  [facility] w/ improved air movement.  RN aware.

## 2021-05-07 NOTE — Hospital Course (Addendum)
History of Present Illness:     Susan Weeks is a 71 y.o. female who was referred by Dr. [name] for evaluation of exertional dyspnea.  This all began over the summer.  Most notably she had symptoms when she was ambulating a few weeks ago with her sister.  She subsequently was seen by Dr. [name] where she underwent a left heart catheterization which showed three-vessel disease.  She has no prior cardiac history. She has known history of essential hypertension, hyperlipidemia, erythrocytosis thought to be due to untreated sleep apnea which improved after starting CPAP treatment and adenocarcinoma of the cecum status post partial colon resection.  The patient and all relevant studies were reviewed by Dr. Kipp Brood who recommended proceeding with elective admission for CABG.  Hospital course: The patient was admitted electively and on 05/07/2021 taken to the operating room at which time he underwent CABG x4.  She tolerated procedure well was taken to the surgical intensive care unit in stable condition.  The patient was weaned and extubated on POD #1.  She was hypotensive and required support with Neo-synephrine.  This was weaned as tolerated. She was treated with additional fluid to help with Neo wean.  The patient was maintaining NSR and was transitioned off Amiodarone drip to an oral regimen of the medication.  Her pacing wires and chest tubes were removed without difficulty.  She was treated with Lasix for volume overloaded state.  She was felt stable for transfer to the progressive care unit on 05/09/2021.She has continued to progress. She is noted to  have a moderate left pleural effusion and thoracentesis was done, yielding 300 cc of fluid.She has a stable expected acute blood loss anemia but with further decrease, she did require a transfusion on 01/06. Incisions are healing well without signs of infection. Amlodipine was restarted for better BP control on 01/08. Of note, she has allergy to ACE/ARB. She was  was felt surgically stable for discharge 01/09.

## 2021-05-08 ENCOUNTER — Inpatient Hospital Stay (HOSPITAL_COMMUNITY): Payer: BC Managed Care – PPO

## 2021-05-08 ENCOUNTER — Encounter (HOSPITAL_COMMUNITY): Payer: Self-pay | Admitting: Thoracic Surgery (Cardiothoracic Vascular Surgery)

## 2021-05-08 DIAGNOSIS — Z951 Presence of aortocoronary bypass graft: Secondary | ICD-10-CM | POA: Diagnosis not present

## 2021-05-08 LAB — POCT I-STAT 7, (LYTES, BLD GAS, ICA,H+H)
Acid-base deficit: 10 mmol/L — ABNORMAL HIGH (ref 0.0–2.0)
Bicarbonate: 13.5 mmol/L — ABNORMAL LOW (ref 20.0–28.0)
Calcium, Ion: 0.85 mmol/L — CL (ref 1.15–1.40)
HCT: 19 % — ABNORMAL LOW (ref 36.0–46.0)
Hemoglobin: 6.5 g/dL — CL (ref 12.0–15.0)
O2 Saturation: 99 %
Patient temperature: 37.8
Potassium: 2.7 mmol/L — CL (ref 3.5–5.1)
Sodium: 147 mmol/L — ABNORMAL HIGH (ref 135–145)
TCO2: 14 mmol/L — ABNORMAL LOW (ref 22–32)
pCO2 arterial: 22.9 mmHg — ABNORMAL LOW (ref 32.0–48.0)
pH, Arterial: 7.382 (ref 7.350–7.450)
pO2, Arterial: 125 mmHg — ABNORMAL HIGH (ref 83.0–108.0)

## 2021-05-08 LAB — BASIC METABOLIC PANEL
Anion gap: 7 (ref 5–15)
Anion gap: 7 (ref 5–15)
Anion gap: 8 (ref 5–15)
BUN: 12 mg/dL (ref 8–23)
BUN: 13 mg/dL (ref 8–23)
BUN: 14 mg/dL (ref 8–23)
CO2: 22 mmol/L (ref 22–32)
CO2: 21 mmol/L — ABNORMAL LOW (ref 22–32)
CO2: 24 mmol/L (ref 22–32)
Calcium: 7.4 mg/dL — ABNORMAL LOW (ref 8.9–10.3)
Calcium: 7.5 mg/dL — ABNORMAL LOW (ref 8.9–10.3)
Calcium: 7.7 mg/dL — ABNORMAL LOW (ref 8.9–10.3)
Chloride: 109 mmol/L (ref 98–111)
Chloride: 109 mmol/L (ref 98–111)
Chloride: 105 mmol/L (ref 98–111)
Creatinine, Ser: 0.9 mg/dL (ref 0.44–1.00)
Creatinine, Ser: 1.01 mg/dL — ABNORMAL HIGH (ref 0.44–1.00)
Creatinine, Ser: 0.98 mg/dL (ref 0.44–1.00)
GFR, Estimated: >60 mL/min (ref >60)
GFR, Estimated: 60 mL/min — ABNORMAL LOW (ref >60)
GFR, Estimated: >60 mL/min (ref >60)
Glucose, Bld: 166 mg/dL — ABNORMAL HIGH (ref 70–99)
Glucose, Bld: 150 mg/dL — ABNORMAL HIGH (ref 70–99)
Glucose, Bld: 154 mg/dL — ABNORMAL HIGH (ref 70–99)
Potassium: 3.6 mmol/L (ref 3.5–5.1)
Potassium: 4 mmol/L (ref 3.5–5.1)
Potassium: 3.4 mmol/L — ABNORMAL LOW (ref 3.5–5.1)
Sodium: 138 mmol/L (ref 135–145)
Sodium: 137 mmol/L (ref 135–145)
Sodium: 137 mmol/L (ref 135–145)

## 2021-05-08 LAB — GLUCOSE, CAPILLARY
Glucose-Capillary: 100 mg/dL — ABNORMAL HIGH (ref 70–99)
Glucose-Capillary: 102 mg/dL — ABNORMAL HIGH (ref 70–99)
Glucose-Capillary: 104 mg/dL — ABNORMAL HIGH (ref 70–99)
Glucose-Capillary: 109 mg/dL — ABNORMAL HIGH (ref 70–99)
Glucose-Capillary: 121 mg/dL — ABNORMAL HIGH (ref 70–99)
Glucose-Capillary: 146 mg/dL — ABNORMAL HIGH (ref 70–99)
Glucose-Capillary: 146 mg/dL — ABNORMAL HIGH (ref 70–99)
Glucose-Capillary: 148 mg/dL — ABNORMAL HIGH (ref 70–99)
Glucose-Capillary: 150 mg/dL — ABNORMAL HIGH (ref 70–99)
Glucose-Capillary: 151 mg/dL — ABNORMAL HIGH (ref 70–99)
Glucose-Capillary: 153 mg/dL — ABNORMAL HIGH (ref 70–99)
Glucose-Capillary: 177 mg/dL — ABNORMAL HIGH (ref 70–99)
Glucose-Capillary: 179 mg/dL — ABNORMAL HIGH (ref 70–99)
Glucose-Capillary: 94 mg/dL (ref 70–99)

## 2021-05-08 LAB — CBC
HCT: 30.1 % — ABNORMAL LOW (ref 36.0–46.0)
HCT: 27.8 % — ABNORMAL LOW (ref 36.0–46.0)
HCT: 29.8 % — ABNORMAL LOW (ref 36.0–46.0)
Hemoglobin: 10.3 g/dL — ABNORMAL LOW (ref 12.0–15.0)
Hemoglobin: 9.3 g/dL — ABNORMAL LOW (ref 12.0–15.0)
Hemoglobin: 10.1 g/dL — ABNORMAL LOW (ref 12.0–15.0)
MCH: 29.9 pg (ref 26.0–34.0)
MCH: 29.6 pg (ref 26.0–34.0)
MCH: 29.9 pg (ref 26.0–34.0)
MCHC: 34.2 g/dL (ref 30.0–36.0)
MCHC: 33.5 g/dL (ref 30.0–36.0)
MCHC: 33.9 g/dL (ref 30.0–36.0)
MCV: 87.2 fL (ref 80.0–100.0)
MCV: 88.5 fL (ref 80.0–100.0)
MCV: 88.2 fL (ref 80.0–100.0)
Platelets: 122 10*3/uL — ABNORMAL LOW (ref 150–400)
Platelets: 122 10*3/uL — ABNORMAL LOW (ref 150–400)
Platelets: 124 10*3/uL — ABNORMAL LOW (ref 150–400)
RBC: 3.45 MIL/uL — ABNORMAL LOW (ref 3.87–5.11)
RBC: 3.14 MIL/uL — ABNORMAL LOW (ref 3.87–5.11)
RBC: 3.38 MIL/uL — ABNORMAL LOW (ref 3.87–5.11)
RDW: 13.2 % (ref 11.5–15.5)
RDW: 13.4 % (ref 11.5–15.5)
RDW: 13.5 % (ref 11.5–15.5)
WBC: 10.5 10*3/uL (ref 4.0–10.5)
WBC: 10.9 10*3/uL — ABNORMAL HIGH (ref 4.0–10.5)
WBC: 10.8 10*3/uL — ABNORMAL HIGH (ref 4.0–10.5)
nRBC: 0 % (ref 0.0–0.2)
nRBC: 0 % (ref 0.0–0.2)
nRBC: 0 % (ref 0.0–0.2)

## 2021-05-08 LAB — MAGNESIUM
Magnesium: 2.8 mg/dL — ABNORMAL HIGH (ref 1.7–2.4)
Magnesium: 2.6 mg/dL — ABNORMAL HIGH (ref 1.7–2.4)

## 2021-05-08 MED ORDER — ENOXAPARIN SODIUM 40 MG/0.4ML IJ SOSY: 40.0000 mg | PREFILLED_SYRINGE | Freq: Every day | INTRAMUSCULAR | Status: DC

## 2021-05-08 MED ORDER — RACEPINEPHRINE HCL 2.25 % IN NEBU
INHALATION_SOLUTION | RESPIRATORY_TRACT | Status: AC
  Filled 2021-05-08: qty 0.5

## 2021-05-08 MED ORDER — INSULIN ASPART 100 UNIT/ML IJ SOLN: 0.0000 [IU] | INTRAMUSCULAR | Status: DC

## 2021-05-08 MED ORDER — POTASSIUM CHLORIDE 10 MEQ/50ML IV SOLN
10.0000 meq | INTRAVENOUS | Status: AC
  Administered 2021-05-08 (×3): 10 meq via INTRAVENOUS
  Filled 2021-05-08 (×3): qty 50

## 2021-05-08 MED ORDER — INSULIN ASPART 100 UNIT/ML IJ SOLN
0.0000 [IU] | INTRAMUSCULAR | Status: DC
  Administered 2021-05-08 – 2021-05-09 (×4): 2 [IU] via SUBCUTANEOUS

## 2021-05-08 MED ORDER — ENOXAPARIN SODIUM 40 MG/0.4ML IJ SOSY
40.0000 mg | PREFILLED_SYRINGE | Freq: Every day | INTRAMUSCULAR | Status: DC
  Administered 2021-05-08 – 2021-05-12 (×5): 40 mg via SUBCUTANEOUS
  Filled 2021-05-08 (×5): qty 0.4

## 2021-05-08 MED ORDER — ORAL CARE MOUTH RINSE
15.0000 mL | Freq: Two times a day (BID) | OROMUCOSAL | Status: DC
  Administered 2021-05-08 – 2021-05-10 (×3): 15 mL via OROMUCOSAL

## 2021-05-08 MED ORDER — POTASSIUM CHLORIDE 10 MEQ/50ML IV SOLN
10.0000 meq | INTRAVENOUS | Status: AC
  Administered 2021-05-08 (×3): 10 meq via INTRAVENOUS

## 2021-05-08 NOTE — Procedures (Signed)
Extubation Procedure Note  Patient Details:   Name: Susan Weeks DOB: [birth year] MRN: [id number]   Airway Documentation: Per Dr Kipp Brood, extubate pt even if no cuff leak.  Pt does have a small cuff leak noted during test this morning.     ABG results reviewed by CCM MD, per MD proceed w/ extubation.   Vent end date: 05/08/21 Vent end time: 0930   Evaluation  O2 sats: stable throughout Complications: No apparent complications Patient did tolerate procedure well. Bilateral Breath Sounds: Clear, Diminished   Yes pt able to speak, no stridor noted.  No distress noted.   CCM at bedside for extubation.    Lenna Sciara 05/08/2021, 9:36 AM

## 2021-05-08 NOTE — Progress Notes (Signed)
Placed patient on CPAP for the night via auto-mode with oxygen set at 2lpm  

## 2021-05-08 NOTE — Progress Notes (Signed)
°  SICU p.m. Rounds  Patient sitting up in bed with no complaints Minimal chest tube output maintaining sinus rhythm P.m. labs reviewed and are satisfactory Doing well after multivessel CABG yesterday Continue current care

## 2021-05-08 NOTE — Progress Notes (Addendum)
[address]       [place] [address]             [phone]      1 Day Post-Op Procedure(s) (LRB): CORONARY ARTERY BYPASS GRAFTING (CABG) X 4, USING LEFT INTERNAL MAMMARY ARTERY, LEFT LEG GREATER SAPHENOUS VEIN HARVESTED ENDOSCOPICALLY (N/A) TRANSESOPHAGEAL ECHOCARDIOGRAM (TEE) (N/A) APPLICATION OF CELL SAVER (N/A) ENDOVEIN HARVEST OF GREATER SAPHENOUS VEIN (Left)  Subjective:  Patient alert on ventilator.  Denies pain.  She follows commands, wants tube out.  Objective: Vital signs in last 24 hours: Temp:  [97.9 F (36.6 C)-100.2 F (37.9 C)] 100 F (37.8 C) (01/04 0700) Pulse Rate:  [61-82] 63 (01/04 0700) Cardiac Rhythm: Normal sinus rhythm (01/04 0400) Resp:  [15-31] 27 (01/04 0700) BP: (87-108)/(59-74) 87/59 (01/04 0700) SpO2:  [92 %-100 %] 99 % (01/04 0700) Arterial Line BP: (87-126)/(47-76) 103/56 (01/04 0700) FiO2 (%):  [40 %-50 %] 40 % (01/04 0328) Weight:  [92.5 kg] 92.5 kg (01/04 0500)  Hemodynamic parameters for last 24 hours: CVP:  [3 mmHg-16 mmHg] 9 mmHg  Intake/Output from previous day: 01/03 0701 - 01/04 0700 In: 5729.4 [I.V.:3715.5; Blood:535; IV Piggyback:1478.9] Out: 2255 [TWSFK:8127; Chest Tube:480]  General appearance: alert, cooperative, and on ventilator w/o distress Heart: regular rate and rhythm Lungs: diminished breath sounds bilaterally Abdomen: soft, non-tender; bowel sounds normal; no masses,  no organomegaly Extremities: edema trace Wound: aquacel on sternotomy, EVH site clean and dry, ecchymosis present  Lab Results: Recent Labs    05/07/21 1853 05/07/21 2243 05/08/21 0305  WBC 8.9  --  10.5  HGB 9.8* 9.9* 10.3*  HCT 29.3* 29.0* 30.1*  PLT 98*  --  122*   BMET:  Recent Labs    05/07/21 1853 05/07/21 2243 05/08/21 0305  NA 137 142 138  K 4.0 4.1 3.6  CL 108  --  109  CO2 22  --  22  GLUCOSE 196*  --  166*  BUN 11  --  12  CREATININE 0.81  --  0.90  CALCIUM 7.0*  --  7.4*    PT/INR:  Recent  Labs    05/07/21 1325  LABPROT 19.1*  INR 1.6*   ABG    Component Value Date/Time   PHART 7.370 05/07/2021 2243   HCO3 21.3 05/07/2021 2243   TCO2 22 05/07/2021 2243   ACIDBASEDEF 3.0 (H) 05/07/2021 2243   O2SAT 98.0 05/07/2021 2243   CBG (last 3)  Recent Labs    05/08/21 0509 05/08/21 0605 05/08/21 0701  GLUCAP 121* 179* 153*    Assessment/Plan: S/P Procedure(s) (LRB): CORONARY ARTERY BYPASS GRAFTING (CABG) X 4, USING LEFT INTERNAL MAMMARY ARTERY, LEFT LEG GREATER SAPHENOUS VEIN HARVESTED ENDOSCOPICALLY (N/A) TRANSESOPHAGEAL ECHOCARDIOGRAM (TEE) (N/A) APPLICATION OF CELL SAVER (N/A) ENDOVEIN HARVEST OF GREATER SAPHENOUS VEIN (Left)  CV- EKG- NSR with 1st degree AV Block- on Amiodarone, would hold off on BB, she is hypotensive on Neo at 10, wean as BP allows Pulm- remains on ventilator, can hopefully wean and extubate today, CT output 480 cc since surgery, leave in place today Renal- creatinine at 0.90, if weight is accurate volume overloaded on exam, BP will not tolerate diuresis at this time Expected post operative blood loss anemia, mild Hgb at 10.3 Expected post operative thrombocytopenia- mild  CBGs controlled, patient is not a diabetic, will stop insulin drip today,start SSIP Dispo- patient stable, can hopefully wean to extubate today, hypotension on Neo, wean as BP allows, POD #1 progression orders  LOS: 1 day    Ellwood Handler, PA-C 05/08/2021   Agree with above Will give more fluid, and wean neo Will extubate today Amio on prophylactically Will transition to PO today Will keep wires and tubes  Kikue Gerhart O Rebekka Lobello

## 2021-05-08 NOTE — Consult Note (Signed)
NAME:  Susan Weeks, MRN:  [id number], DOB:  Sep 12, 1950, LOS: 1 ADMISSION DATE:  05/07/2021, CONSULTATION DATE:  05/08/21 REFERRING MD:  Kipp Brood, MD CHIEF COMPLAINT:  Vent management   History of Present Illness:  71 year old female with CAD, HTN, HLD, OSA on CPAP admitted for CABG. She wnderwent CABG x4 on 1/3 without complicat. Post-op SBT was passed however no cuff leak noted. Lips and tongue were also swollen. Was given dexamethasone x 1. This AM, she is awake alert and following commands. Tolerated PS for 30 minutes  Pertinent  Medical History  CAD, HTN, HLD, mild OSA on CPAP, hx adenocarcinoma of cecum s/p partial colon resection  Significant Hospital Events: Including procedures, antibiotic start and stop dates in addition to other pertinent events   1/3 S/p CABG x 4. Did not extubate due to no cuff leak. Dexamethasone x 1 1/4 Extubate to [place]  Interim History / Subjective:  As above  Objective   Blood pressure 93/62, pulse 61, temperature 100 F (37.8 C), resp. rate (!) 24, height 5\' 3"  (1.6 m), weight 92.5 kg, last menstrual period 06/28/1982, SpO2 100 %. CVP:  [3 mmHg-16 mmHg] 9 mmHg  Vent Mode: PSV;CPAP FiO2 (%):  [40 %-50 %] 40 % Set Rate:  [4 bmp-18 bmp] 4 bmp Vt Set:  [420 mL] 420 mL PEEP:  [5 cmH20] 5 cmH20 Pressure Support:  [10 cmH20] 10 cmH20 Plateau Pressure:  [12 cmH20-18 cmH20] 18 cmH20   Intake/Output Summary (Last 24 hours) at 05/08/2021 0919 Last data filed at 05/08/2021 0800 Gross per 24 hour  Intake 5502 ml  Output 2255 ml  Net 3247 ml   Filed Weights   05/07/21 0608 05/08/21 0500  Weight: 83.9 kg 92.5 kg   Physical Exam: General: Obese female, well-appearing, no acute distress HENT: [place], AT, ETT in place Eyes: EOMI, no scleral icterus Respiratory: Clear to auscultation bilaterally.  No crackles, wheezing or rales Cardiovascular: RRR, -M/R/G, no JVD GI: BS+, soft, nontender Extremities:-Edema,-tenderness Neuro: Awake alert and following commands,  CNII-XII grossly intact  CXR 05/08/20 ETT in place. Low lung volumes with bibasilar atelectasis  Resolved Hospital Problem list   N/A  Assessment & Plan:   Post op vent management OSA on CPAP - Reviewed ABG. Tolerated PS. Cuff leak present. Decision to extubate - Wean supplemental oxygen - Resume home CPAP  Hypotension, post-op - Wean off neo for goal MAP >65  Hypokalemia - Repeat BMET - Goal K 4  Post op anemia May be erroneous  - Recheck CBC - Transfuse for goal >7  Pulmonary will follow today post-extubation. Available as needed  Best Practice (right click and "Reselect all SmartList Selections" daily)  Per primary  Labs   CBC: Recent Labs  Lab 05/03/21 1032 05/07/21 0757 05/07/21 1059 05/07/21 1101 05/07/21 1325 05/07/21 1329 05/07/21 1734 05/07/21 1853 05/07/21 2243 05/08/21 0305 05/08/21 0902  WBC 4.6  --   --   --  10.1  --   --  8.9  --  10.5  --   HGB 15.4*   < > 7.8*   < > 11.1*   < > 8.2* 9.8* 9.9* 10.3* 6.5*  HCT 46.6*   < > 21.9*   < > 32.1*   < > 24.0* 29.3* 29.0* 30.1* 19.0*  MCV 88.1  --   --   --  87.0  --   --  87.2  --  87.2  --   PLT 220  --  120*  --  101*  --   --  98*  --  122*  --    < > = values in this interval not displayed.    Basic Metabolic Panel: Recent Labs  Lab 05/03/21 1032 05/07/21 0757 05/07/21 0931 05/07/21 1002 05/07/21 1031 05/07/21 1035 05/07/21 1154 05/07/21 1329 05/07/21 1734 05/07/21 1853 05/07/21 2243 05/08/21 0305 05/08/21 0902  NA 136   < > 139   < > 137   < > 139   < > 143 137 142 138 147*  K 3.7   < > 2.9*   < > 3.6   < > 3.5   < > 3.6 4.0 4.1 3.6 2.7*  CL 103   < > 105  --  101  --  105  --   --  108  --  109  --   CO2 21*  --   --   --   --   --   --   --   --  22  --  22  --   GLUCOSE 99   < > 134*  --  105*  --  132*  --   --  196*  --  166*  --   BUN 12   < > 13  --  13  --  13  --   --  11  --  12  --   CREATININE 0.80   < > 0.60  --  0.60  --  0.60  --   --  0.81  --  0.90  --   CALCIUM  9.3  --   --   --   --   --   --   --   --  7.0*  --  7.4*  --   MG  --   --   --   --   --   --   --   --   --  3.2*  --  2.8*  --    < > = values in this interval not displayed.   GFR: Estimated Creatinine Clearance: 61.9 mL/min (by C-G formula based on SCr of 0.9 mg/dL). Recent Labs  Lab 05/03/21 1032 05/07/21 1325 05/07/21 1853 05/08/21 0305  WBC 4.6 10.1 8.9 10.5    Liver Function Tests: Recent Labs  Lab 05/03/21 1032  AST 30  ALT 37  ALKPHOS 62  BILITOT 1.2  PROT 7.4  ALBUMIN 4.3   No results for input(s): LIPASE, AMYLASE in the last 168 hours. No results for input(s): AMMONIA in the last 168 hours.  ABG    Component Value Date/Time   PHART 7.382 05/08/2021 0902   PCO2ART 22.9 (L) 05/08/2021 0902   PO2ART 125 (H) 05/08/2021 0902   HCO3 13.5 (L) 05/08/2021 0902   TCO2 14 (L) 05/08/2021 0902   ACIDBASEDEF 10.0 (H) 05/08/2021 0902   O2SAT 99.0 05/08/2021 0902     Coagulation Profile: Recent Labs  Lab 05/03/21 1032 05/07/21 1325  INR 0.9 1.6*    Cardiac Enzymes: No results for input(s): CKTOTAL, CKMB, CKMBINDEX, TROPONINI in the last 168 hours.  HbA1C: Hgb A1c MFr Bld  Date/Time Value Ref Range Status  05/03/2021 10:33 AM 5.8 (H) 4.8 - 5.6 % Final    Comment:    (NOTE) Pre diabetes:          5.7%-6.4%  Diabetes:              >6.4%  Glycemic control for   <7.0%  adults with diabetes   11/07/2020 09:57 AM 5.9 4.6 - 6.5 % Final    Comment:    Glycemic Control Guidelines for People with Diabetes:Non Diabetic:  <6%Goal of Therapy: <7%Additional Action Suggested:  >8%     CBG: Recent Labs  Lab 05/08/21 0509 05/08/21 0605 05/08/21 0701 05/08/21 0752 05/08/21 0859  GLUCAP 121* 179* 153* 109* 102*    Review of Systems:   Unable to obtain due to vented status  Past Medical History:  She,  has a past medical history of Abnormal liver function, Colon cancer ([place]), Coronary artery disease (2022), Heart murmur (2022), History of colon polyps,  Hyperlipidemia, Hypertension, and Sleep apnea (12/28/2017).   Surgical History:   Past Surgical History:  Procedure Laterality Date   ABDOMINAL HYSTERECTOMY  1984   secondary to bleeding and fibroid tumors   APPENDECTOMY     COLONOSCOPY W/ POLYPECTOMY  2001   COLONOSCOPY WITH PROPOFOL N/A 07/28/2016   Procedure: COLONOSCOPY WITH PROPOFOL;  Surgeon: [name], MD;  [facility] ENDOSCOPY;  Service: Endoscopy;  Laterality: N/A;   CORONARY STENT INTERVENTION N/A 04/15/2021   Procedure: CORONARY STENT INTERVENTION;  Surgeon: [name], MD;  Location: [facility];  Service: Cardiovascular;  Laterality: N/A;   DILATION AND CURETTAGE OF UTERUS  1967   LEFT HEART CATH AND CORONARY ANGIOGRAPHY N/A 04/15/2021   Procedure: LEFT HEART CATH AND CORONARY ANGIOGRAPHY;  Surgeon: [name], MD;  Location: [facility];  Service: Cardiovascular;  Laterality: N/A;     Social History:   reports that she quit smoking about 45 years ago. Her smoking use included cigarettes. She has never used smokeless tobacco. She reports that she does not drink alcohol and does not use drugs.   Family History:  Her family history includes Cancer in her father; Heart attack in her brother; Heart disease in her brother; Hypertension in her brother, mother, and sister; Stroke in her mother. There is no history of Breast cancer.   Allergies Allergies  Allergen Reactions   Tape     Bruising from paper tape   Lisinopril Rash    Developed slight facial swelling and itching over forehead while taking. Unclear if definite ACE allergy, but concern enough to stop in ER today.   Z-Pak [Azithromycin] Nausea And Vomiting    Caused Emesis     Home Medications  Prior to Admission medications   Medication Sig Start Date End Date Taking? Authorizing Provider  amLODipine (NORVASC) 5 MG tablet TAKE 1 TABLET BY MOUTH EVERY DAY 04/03/21  Yes [name], MD  aspirin EC 81 MG tablet Take 1  tablet (81 mg total) by mouth daily. 10/16/17  Yes Earlie Server, MD  Calcium Carbonate-Vitamin D (CALCIUM 600+D PO) Take 1 tablet by mouth daily.   Yes [provider]  hydrochlorothiazide (MICROZIDE) 12.5 MG capsule TAKE 1 CAPSULE BY MOUTH EVERY DAY 05/01/21  Yes [name], MD  metoprolol tartrate (LOPRESSOR) 25 MG tablet Take 1 tablet (25 mg total) by mouth 2 (two) times daily. 04/15/21 04/15/22 Yes [name], MD  NON FORMULARY Pt uses a cpap nightly   Yes [provider]  rosuvastatin (CRESTOR) 5 MG tablet TAKE 1 TABLET BY MOUTH EVERY DAY 04/03/21  Yes Crecencio Mc, MD  vitamin E 400 UNIT capsule Take 400 Units by mouth daily.    Yes [provider]  acetaminophen (TYLENOL) 500 MG tablet Take 500 mg by mouth every 6 (six) hours as needed  for moderate pain.    [provider]  lisinopril (ZESTRIL) 30 MG tablet TAKE 1 TABLET BY MOUTH EVERY DAY 07/13/20 07/29/20  [name], MD     Critical care time: 35 min    The patient is critically ill with respiratory failure secondary to post-op vent and failed SBT due to cuff leak. Requires high complexity decision making for assessment and support, frequent evaluation and titration of therapies, application of advanced monitoring technologies and extensive interpretation of multiple databases.  Discussed plan with RN and RT.   Rodman Pickle, M.D. [facility] Pulmonary/Critical Care Medicine 05/08/2021 9:35 AM   Please see Amion for pager number to reach on-call Pulmonary and Critical Care Team.

## 2021-05-08 NOTE — TOC Progression Note (Signed)
Transition of Care [facility]) - Progression Note    Patient Details  Name: Susan Weeks MRN: [id number] Date of Birth: Jul 08, 1950  Transition of Care [facility]) CM/SW [place], RN Phone Number:[phone]  05/08/2021, 3:47 PM  Clinical Narrative:     Transition of Care [facility]) Screening Note   Patient Details  Name: Susan Weeks Date of Birth: 1951-01-02   Transition of Care [facility]) CM/SW Contact:    Angelita Ingles, RN Phone Number:[phone]  05/08/2021, 3:47 PM    Transition of Care Department [facility]) has reviewed patient and no TOC needs have been identified at this time. We will continue to monitor patient advancement through interdisciplinary progression rounds. If new patient transition needs arise, please place a TOC consult.          Expected Discharge Plan and Services                                                 Social Determinants of Health (SDOH) Interventions    Readmission Risk Interventions No flowsheet data found.

## 2021-05-09 ENCOUNTER — Inpatient Hospital Stay (HOSPITAL_COMMUNITY): Payer: BC Managed Care – PPO

## 2021-05-09 ENCOUNTER — Ambulatory Visit: Payer: BC Managed Care – PPO | Admitting: Medical

## 2021-05-09 LAB — CBC
HCT: 29.3 % — ABNORMAL LOW (ref 36.0–46.0)
Hemoglobin: 9.5 g/dL — ABNORMAL LOW (ref 12.0–15.0)
MCH: 29.1 pg (ref 26.0–34.0)
MCHC: 32.4 g/dL (ref 30.0–36.0)
MCV: 89.9 fL (ref 80.0–100.0)
Platelets: 115 10*3/uL — ABNORMAL LOW (ref 150–400)
RBC: 3.26 MIL/uL — ABNORMAL LOW (ref 3.87–5.11)
RDW: 13.6 % (ref 11.5–15.5)
WBC: 10.7 10*3/uL — ABNORMAL HIGH (ref 4.0–10.5)
nRBC: 0 % (ref 0.0–0.2)

## 2021-05-09 LAB — BASIC METABOLIC PANEL
Anion gap: 4 — ABNORMAL LOW (ref 5–15)
BUN: 13 mg/dL (ref 8–23)
CO2: 25 mmol/L (ref 22–32)
Calcium: 7.6 mg/dL — ABNORMAL LOW (ref 8.9–10.3)
Chloride: 103 mmol/L (ref 98–111)
Creatinine, Ser: 0.99 mg/dL (ref 0.44–1.00)
GFR, Estimated: >60 mL/min (ref >60)
Glucose, Bld: 137 mg/dL — ABNORMAL HIGH (ref 70–99)
Potassium: 3.9 mmol/L (ref 3.5–5.1)
Sodium: 132 mmol/L — ABNORMAL LOW (ref 135–145)

## 2021-05-09 LAB — GLUCOSE, CAPILLARY
Glucose-Capillary: 143 mg/dL — ABNORMAL HIGH (ref 70–99)
Glucose-Capillary: 181 mg/dL — ABNORMAL HIGH (ref 70–99)
Glucose-Capillary: 149 mg/dL — ABNORMAL HIGH (ref 70–99)
Glucose-Capillary: 135 mg/dL — ABNORMAL HIGH (ref 70–99)
Glucose-Capillary: 131 mg/dL — ABNORMAL HIGH (ref 70–99)

## 2021-05-09 LAB — ECHO INTRAOPERATIVE TEE
Height: 63 in
Weight: 2960 oz

## 2021-05-09 MED ORDER — FUROSEMIDE 40 MG PO TABS
40.0000 mg | ORAL_TABLET | Freq: Every day | ORAL | Status: DC
  Administered 2021-05-10: 40 mg via ORAL
  Filled 2021-05-09: qty 1

## 2021-05-09 MED ORDER — SODIUM CHLORIDE 0.9 % IV SOLN: 250.0000 mL | INTRAVENOUS | Status: DC | PRN

## 2021-05-09 MED ORDER — SODIUM CHLORIDE 0.9% FLUSH
3.0000 mL | Freq: Two times a day (BID) | INTRAVENOUS | Status: DC
  Administered 2021-05-09: 3 mL via INTRAVENOUS
  Administered 2021-05-09: 10 mL via INTRAVENOUS
  Administered 2021-05-10 – 2021-05-13 (×5): 3 mL via INTRAVENOUS

## 2021-05-09 MED ORDER — FUROSEMIDE 10 MG/ML IJ SOLN
40.0000 mg | Freq: Once | INTRAMUSCULAR | Status: AC
Start: 2021-05-09 — End: 2021-05-09
  Administered 2021-05-09: 40 mg via INTRAVENOUS
  Filled 2021-05-09: qty 4

## 2021-05-09 MED ORDER — ~~LOC~~ CARDIAC SURGERY, PATIENT & FAMILY EDUCATION: Freq: Once | Status: AC

## 2021-05-09 MED ORDER — AMIODARONE HCL 200 MG PO TABS
200.0000 mg | ORAL_TABLET | Freq: Two times a day (BID) | ORAL | Status: DC
  Administered 2021-05-09 – 2021-05-13 (×9): 200 mg via ORAL
  Filled 2021-05-09 (×9): qty 1

## 2021-05-09 MED ORDER — INSULIN ASPART 100 UNIT/ML IJ SOLN
0.0000 [IU] | Freq: Three times a day (TID) | INTRAMUSCULAR | Status: DC
  Administered 2021-05-09 – 2021-05-11 (×4): 2 [IU] via SUBCUTANEOUS

## 2021-05-09 MED ORDER — POTASSIUM CHLORIDE CRYS ER 20 MEQ PO TBCR
40.0000 meq | EXTENDED_RELEASE_TABLET | Freq: Every day | ORAL | Status: DC
  Administered 2021-05-09 – 2021-05-13 (×5): 40 meq via ORAL
  Filled 2021-05-09 (×5): qty 2

## 2021-05-09 MED ORDER — SODIUM CHLORIDE 0.9% FLUSH: 3.0000 mL | INTRAVENOUS | Status: DC | PRN

## 2021-05-09 MED FILL — Electrolyte-R (PH 7.4) Solution: INTRAVENOUS | Qty: 6000 | Status: AC

## 2021-05-09 MED FILL — Potassium Chloride Inj 2 mEq/ML: INTRAVENOUS | Qty: 40 | Status: AC

## 2021-05-09 MED FILL — Heparin Sodium (Porcine) Inj 1000 Unit/ML: INTRAMUSCULAR | Qty: 10 | Status: AC

## 2021-05-09 MED FILL — Sodium Chloride IV Soln 0.9%: INTRAVENOUS | Qty: 2000 | Status: AC

## 2021-05-09 MED FILL — Sodium Bicarbonate IV Soln 8.4%: INTRAVENOUS | Qty: 50 | Status: AC

## 2021-05-09 MED FILL — Lidocaine HCl Local Preservative Free (PF) Inj 2%: INTRAMUSCULAR | Qty: 15 | Status: AC

## 2021-05-09 MED FILL — Mannitol IV Soln 20%: INTRAVENOUS | Qty: 500 | Status: AC

## 2021-05-09 MED FILL — Heparin Sodium (Porcine) Inj 1000 Unit/ML: Qty: 1000 | Status: AC

## 2021-05-09 MED FILL — Calcium Chloride Inj 10%: INTRAVENOUS | Qty: 10 | Status: AC

## 2021-05-09 MED FILL — Albumin, Human Inj 5%: INTRAVENOUS | Qty: 250 | Status: AC

## 2021-05-09 NOTE — Progress Notes (Signed)
Mobility Specialist: Progress Note   05/09/21 1705  Mobility  Activity Ambulated in hall  Level of Assistance Minimal assist, patient does 75% or more  Assistive Device Front wheel walker  Distance Ambulated (ft) 230 ft  Mobility Ambulated with assistance in hallway  Mobility Response Tolerated well  Mobility performed by Mobility specialist  Bed Position Chair  $Mobility charge 1 Mobility   Pre-Mobility on 2 L/min [place]: 78 HR, 95% SpO2 During Mobility on 2 L/min [place]: 95-97% SpO2 Post-Mobility on 1 L/min [place]: 83 HR, 93% SpO2  Pt required minA to sit EOB as well as to stand. Pt ambulated on 2 L/min [place] and was able to wean down to 1 L/min  when back in the room. Pt to the recliner after walk with call bell and phone in reach.   [facility] Adar Rase Mobility Specialist Mobility Specialist 4 [place]: [phone] Mobility Specialist 2 [place]: [phone]

## 2021-05-09 NOTE — Progress Notes (Signed)
A&Ox4 oriented to room.  CT x 2.  CCMD notified.  Vital signs per protocol.  CHG

## 2021-05-09 NOTE — Progress Notes (Signed)
CT x 2 discontinued without difficulty.  Pt tolerated well.  Vital signs per protocol.  Denies SOB/difficulty breathing.

## 2021-05-09 NOTE — Progress Notes (Addendum)
° °   °  [address]       [place] [address]             [phone]    2 Days Post-Op Procedure(s) (LRB): CORONARY ARTERY BYPASS GRAFTING (CABG) X 4, USING LEFT INTERNAL MAMMARY ARTERY, LEFT LEG GREATER SAPHENOUS VEIN HARVESTED ENDOSCOPICALLY (N/A) TRANSESOPHAGEAL ECHOCARDIOGRAM (TEE) (N/A) APPLICATION OF CELL SAVER (N/A) ENDOVEIN HARVEST OF GREATER SAPHENOUS VEIN (Left)  Subjective:  Patient up in chair, feels well overall.  Denies N/V  Objective: Vital signs in last 24 hours: Temp:  [99.1 F (37.3 C)-100.2 F (37.9 C)] 99.3 F (37.4 C) (01/05 0700) Pulse Rate:  [62-82] 70 (01/05 0700) Cardiac Rhythm: Normal sinus rhythm (01/05 0000) Resp:  [13-33] 23 (01/05 0700) BP: (87-128)/(31-100) 114/55 (01/05 0700) SpO2:  [93 %-100 %] 94 % (01/05 0700) Arterial Line BP: (102-135)/(47-60) 135/58 (01/04 1400) FiO2 (%):  [40 %] 40 % (01/04 0833) Weight:  [91.4 kg] 91.4 kg (01/05 0500)  Hemodynamic parameters for last 24 hours: CVP:  [5 mmHg-17 mmHg] 13 mmHg  Intake/Output from previous day: 01/04 0701 - 01/05 0700 In: 1518.2 [P.O.:240; I.V.:782.7; IV Piggyback:495.5] Out: 968 [Urine:808; Chest Tube:160]  General appearance: alert, cooperative, and no distress Heart: regular rate and rhythm Lungs: clear to auscultation bilaterally Abdomen: soft, non-tender; bowel sounds normal; no masses,  no organomegaly Extremities: edema trace Wound: clean and dry, aquacel on sternum  Lab Results: Recent Labs    05/08/21 0923 05/08/21 1659  WBC 10.9* 10.8*  HGB 9.3* 10.1*  HCT 27.8* 29.8*  PLT 122* 124*   BMET:  Recent Labs    05/08/21 1659 05/09/21 0500  NA 137 132*  K 3.4* 3.9  CL 105 103  CO2 24 25  GLUCOSE 154* 137*  BUN 14 13  CREATININE 0.98 0.99  CALCIUM 7.7* 7.6*    PT/INR:  Recent Labs    05/07/21 1325  LABPROT 19.1*  INR 1.6*   ABG    Component Value Date/Time   PHART 7.382 05/08/2021 0902   HCO3 13.5 (L) 05/08/2021 0902   TCO2 14 (L)  05/08/2021 0902   ACIDBASEDEF 10.0 (H) 05/08/2021 0902   O2SAT 99.0 05/08/2021 0902   CBG (last 3)  Recent Labs    05/08/21 2314 05/09/21 0344 05/09/21 0751  GLUCAP 146* 143* 181*    Assessment/Plan: S/P Procedure(s) (LRB): CORONARY ARTERY BYPASS GRAFTING (CABG) X 4, USING LEFT INTERNAL MAMMARY ARTERY, LEFT LEG GREATER SAPHENOUS VEIN HARVESTED ENDOSCOPICALLY (N/A) TRANSESOPHAGEAL ECHOCARDIOGRAM (TEE) (N/A) APPLICATION OF CELL SAVER (N/A) ENDOVEIN HARVEST OF GREATER SAPHENOUS VEIN (Left)  CV- maintaining NSR, BP stable- will d/c EPW today- transition to oral Amiodarone, continue Lopressor Pulm- no acute issues, CT output remains low.. will d/c later today after EPW have been removed Renal- creatinine okay, mild hyponatremia at 132- remains volume overloaded, Lasix as ordered by Dr. Kipp Brood Expected post operative blood loss anemia,Hgb at 10.1 CBGs controlled, patient is not a diabetic will transition SSIP to TID HS Dispo- patient stable, doing well overall, transition to oral Amiodarone, she is maintaining NSR, will remove EPW today, CT later today after EPW removal, transfer to 4E   LOS: 2 days    Ellwood Handler, PA-C 05/09/2021  Agree with above. Doing well.  We will transfer to the floor today.  Gurdeep Keesey Bary Leriche

## 2021-05-10 ENCOUNTER — Encounter (HOSPITAL_COMMUNITY): Payer: Self-pay | Admitting: Thoracic Surgery (Cardiothoracic Vascular Surgery)

## 2021-05-10 ENCOUNTER — Inpatient Hospital Stay (HOSPITAL_COMMUNITY): Payer: BC Managed Care – PPO

## 2021-05-10 HISTORY — PX: IR THORACENTESIS RIGHT ASP PLEURAL SPACE W/IMG GUIDE: IMG5380

## 2021-05-10 LAB — CBC
HCT: 30.9 % — ABNORMAL LOW (ref 36.0–46.0)
Hemoglobin: 10.5 g/dL — ABNORMAL LOW (ref 12.0–15.0)
MCH: 29.8 pg (ref 26.0–34.0)
MCHC: 34 g/dL (ref 30.0–36.0)
MCV: 87.8 fL (ref 80.0–100.0)
Platelets: 149 10*3/uL — ABNORMAL LOW (ref 150–400)
RBC: 3.52 MIL/uL — ABNORMAL LOW (ref 3.87–5.11)
RDW: 13.8 % (ref 11.5–15.5)
WBC: 11 10*3/uL — ABNORMAL HIGH (ref 4.0–10.5)
nRBC: 0.2 % (ref 0.0–0.2)

## 2021-05-10 LAB — GLUCOSE, CAPILLARY
Glucose-Capillary: 119 mg/dL — ABNORMAL HIGH (ref 70–99)
Glucose-Capillary: 130 mg/dL — ABNORMAL HIGH (ref 70–99)
Glucose-Capillary: 123 mg/dL — ABNORMAL HIGH (ref 70–99)
Glucose-Capillary: 163 mg/dL — ABNORMAL HIGH (ref 70–99)

## 2021-05-10 LAB — BASIC METABOLIC PANEL
Anion gap: 8 (ref 5–15)
BUN: 10 mg/dL (ref 8–23)
CO2: 27 mmol/L (ref 22–32)
Calcium: 8.1 mg/dL — ABNORMAL LOW (ref 8.9–10.3)
Chloride: 101 mmol/L (ref 98–111)
Creatinine, Ser: 0.78 mg/dL (ref 0.44–1.00)
GFR, Estimated: >60 mL/min (ref >60)
Glucose, Bld: 129 mg/dL — ABNORMAL HIGH (ref 70–99)
Potassium: 4.1 mmol/L (ref 3.5–5.1)
Sodium: 136 mmol/L (ref 135–145)

## 2021-05-10 MED ORDER — LIDOCAINE HCL 1 % IJ SOLN
INTRAMUSCULAR | Status: AC
  Administered 2021-05-10: 10 mL
  Filled 2021-05-10: qty 20

## 2021-05-10 MED ORDER — METOPROLOL TARTRATE 25 MG PO TABS
25.0000 mg | ORAL_TABLET | Freq: Two times a day (BID) | ORAL | Status: DC
  Administered 2021-05-10 – 2021-05-13 (×7): 25 mg via ORAL
  Filled 2021-05-10 (×7): qty 1

## 2021-05-10 NOTE — Progress Notes (Addendum)
[address]       [place] [address]             [phone]      3 Days Post-Op Procedure(s) (LRB): CORONARY ARTERY BYPASS GRAFTING (CABG) X 4, USING LEFT INTERNAL MAMMARY ARTERY, LEFT LEG GREATER SAPHENOUS VEIN HARVESTED ENDOSCOPICALLY (N/A) TRANSESOPHAGEAL ECHOCARDIOGRAM (TEE) (N/A) APPLICATION OF CELL SAVER (N/A) ENDOVEIN HARVEST OF GREATER SAPHENOUS VEIN (Left) Subjective: Feels pretty well, mild nausea  Objective: Vital signs in last 24 hours: Temp:  [98.4 F (36.9 C)-99.5 F (37.5 C)] 98.5 F (36.9 C) (01/06 0351) Pulse Rate:  [69-81] 79 (01/06 0351) Cardiac Rhythm: Normal sinus rhythm (01/05 1900) Resp:  [16-34] 20 (01/06 0351) BP: (100-160)/(55-88) 160/73 (01/06 0351) SpO2:  [89 %-95 %] 90 % (01/06 0351) Weight:  [89.6 kg-91.3 kg] 89.6 kg (01/06 0419)  Hemodynamic parameters for last 24 hours:    Intake/Output from previous day: 01/05 0701 - 01/06 0700 In: 325.4 [P.O.:240; I.V.:85.4] Out: 1625 [Urine:1625] Intake/Output this shift: No intake/output data recorded.  General appearance: alert, cooperative, and no distress Heart: regular rate and rhythm Lungs: dim left base Abdomen: benign Extremities: minor edema Wound: evh site ok, chest dressing should be removed some time today  Lab Results: Recent Labs    05/09/21 0500 05/10/21 0354  WBC 10.7* 11.0*  HGB 9.5* 10.5*  HCT 29.3* 30.9*  PLT 115* 149*   BMET:  Recent Labs    05/09/21 0500 05/10/21 0354  NA 132* 136  K 3.9 4.1  CL 103 101  CO2 25 27  GLUCOSE 137* 129*  BUN 13 10  CREATININE 0.99 0.78  CALCIUM 7.6* 8.1*    PT/INR:  Recent Labs    05/07/21 1325  LABPROT 19.1*  INR 1.6*   ABG    Component Value Date/Time   PHART 7.382 05/08/2021 0902   HCO3 13.5 (L) 05/08/2021 0902   TCO2 14 (L) 05/08/2021 0902   ACIDBASEDEF 10.0 (H) 05/08/2021 0902   O2SAT 99.0 05/08/2021 0902   CBG (last 3)  Recent Labs    05/09/21 1655 05/09/21 2119 05/10/21 0619   GLUCAP 135* 131* 119*    Meds Scheduled Meds:  acetaminophen  1,000 mg Oral Q6H   amiodarone  200 mg Oral BID   aspirin EC  325 mg Oral Daily   bisacodyl  10 mg Oral Daily   Or   bisacodyl  10 mg Rectal Daily   chlorhexidine gluconate (MEDLINE KIT)  15 mL Mouth Rinse BID   Chlorhexidine Gluconate Cloth  6 each Topical Daily   docusate sodium  200 mg Oral Daily   enoxaparin (LOVENOX) injection  40 mg Subcutaneous QHS   furosemide  40 mg Oral Daily   insulin aspart  0-24 Units Subcutaneous TID WC   mouth rinse  15 mL Mouth Rinse BID   metoprolol tartrate  25 mg Oral BID   pantoprazole  40 mg Oral Daily   potassium chloride  40 mEq Oral Daily   rosuvastatin  5 mg Oral Daily   sodium chloride flush  10-40 mL Intracatheter Q12H   sodium chloride flush  3 mL Intravenous Q12H   sodium chloride flush  3 mL Intravenous Q12H   Continuous Infusions:  sodium chloride Stopped (05/07/21 1345)   sodium chloride     sodium chloride     sodium chloride     lactated ringers     lactated ringers Stopped (05/09/21 0919)   PRN Meds:.sodium chloride, sodium  chloride, dextrose, metoprolol tartrate, midazolam, morphine injection, ondansetron (ZOFRAN) IV, oxyCODONE, sodium chloride flush, sodium chloride flush, sodium chloride flush, traMADol  Xrays DG Chest Port 1 View  Result Date: 05/09/2021 CLINICAL DATA:  Status post cardiac surgery. EXAM: PORTABLE CHEST 1 VIEW COMPARISON:  May 08, 2021. FINDINGS: Stable cardiomegaly. Endotracheal and nasogastric tubes have been removed. Right internal jugular catheter is stable. Left-sided chest tube is noted without pneumothorax. Left pleural effusion is noted with associated atelectasis. Right basilar atelectasis and small pleural effusion is noted as well. Bony thorax is unremarkable. IMPRESSION: Stable position of left-sided chest tube without pneumothorax. Endotracheal and nasogastric tubes have been removed. Bibasilar atelectasis and small pleural  effusions are noted. Electronically Signed   By: Marijo Conception M.D.   On: 05/09/2021 08:25    Assessment/Plan: S/P Procedure(s) (LRB): CORONARY ARTERY BYPASS GRAFTING (CABG) X 4, USING LEFT INTERNAL MAMMARY ARTERY, LEFT LEG GREATER SAPHENOUS VEIN HARVESTED ENDOSCOPICALLY (N/A) TRANSESOPHAGEAL ECHOCARDIOGRAM (TEE) (N/A) APPLICATION OF CELL SAVER (N/A) ENDOVEIN HARVEST OF GREATER SAPHENOUS VEIN (Left)  POD#3 1 Tmax 99.5, VSS s BP 100-160, sinus rhythm, on oral amio 2 sats good on 2 liters, CPAP at night 3 good UOP, weight about 6 kg >preop if accurate, on daily lasix 40 po, normal renal fxn, sodium has normalized 4 CBG's good control- not a diabetic 5 expected ABLA is stable 6 thrombocytopenia almost resolved, PLT 149 K 7 WBC pretty stable at 11.0 8 CXR - mod left effusion, may need to consider thoracentesis 9 push rehab/pulm hygiene   LOS: 3 days    Susan Giovanni PA-C Pager [phone] 05/10/2021    Agree with above. Remains on supplemental oxygen.  Chest x-ray does show a moderate sized left effusion.  We will plan for thoracentesis Continue physical therapy and ambulation.  Jakeya Gherardi Bary Leriche

## 2021-05-10 NOTE — Progress Notes (Signed)
CARDIAC REHAB PHASE I   PRE:  Rate/Rhythm: 86 SR    BP: sitting 141/76    SaO2: 96 1L  MODE:  Ambulation: 400 ft   POST:  Rate/Rhythm: 96 SR with PACs    BP: sitting 148/53     SaO2: 91-96 1L  Pt stood with rocking and pillow. Walked with RW. Attempted RA however SaO2 87-88 RA at desk. Applied 1L and remained 91-96 1L rest of walk. Steady with RW. Return to recliner for lunch. Progressing well, encouraged IS and x2 more walks. [phone]   [place], ACSM 05/10/2021 12:21 PM

## 2021-05-10 NOTE — Progress Notes (Signed)
Pt practiced IS but only achieving 250-300 ml. Gave instructions. 89-90 1L. Preparing for ambulation but transport came for thoracentesis. Assisted pt to bed min assist with rocking and needed help lifting her legs getting in bed. Will f/u for ambulation later. [place], ACSM 9:20 AM 05/10/2021

## 2021-05-10 NOTE — Progress Notes (Signed)
Mobility Specialist Progress Note    05/10/21 1614  Mobility  Activity Ambulated in hall  Level of Assistance Minimal assist, patient does 75% or more  Assistive Device Front wheel walker  Distance Ambulated (ft) 400 ft  Mobility Ambulated with assistance in hallway  Mobility Response Tolerated fair  Mobility performed by Mobility specialist  $Mobility charge 1 Mobility   Pre-Mobility: 89 HR, 165/81 BP, 92% SpO2 During Mobility: 103 HR  Pt received in bed and agreeable. No complaints. Ambulated on 2LO2. Returned to bed with call bell in reach.   [facility] Mobility Specialist  M.S. 2C and 6E: [phone] M.S. 4E: [phone]

## 2021-05-10 NOTE — Procedures (Signed)
PROCEDURE SUMMARY:  Successful US guided left thoracentesis. Yielded 300 mL of red fluid. Patient tolerated procedure well. No immediate complications. EBL = trace  Post procedure chest X-ray reveals no pneumothorax.  Lawan Nanez S Kalen Ratajczak PA-C 05/10/2021 11:06 AM

## 2021-05-11 LAB — BASIC METABOLIC PANEL
Anion gap: 6 (ref 5–15)
BUN: 13 mg/dL (ref 8–23)
CO2: 26 mmol/L (ref 22–32)
Calcium: 8.1 mg/dL — ABNORMAL LOW (ref 8.9–10.3)
Chloride: 107 mmol/L (ref 98–111)
Creatinine, Ser: 0.97 mg/dL (ref 0.44–1.00)
GFR, Estimated: >60 mL/min (ref >60)
Glucose, Bld: 135 mg/dL — ABNORMAL HIGH (ref 70–99)
Potassium: 3.9 mmol/L (ref 3.5–5.1)
Sodium: 139 mmol/L (ref 135–145)

## 2021-05-11 LAB — GLUCOSE, CAPILLARY
Glucose-Capillary: 155 mg/dL — ABNORMAL HIGH (ref 70–99)
Glucose-Capillary: 154 mg/dL — ABNORMAL HIGH (ref 70–99)

## 2021-05-11 MED ORDER — FUROSEMIDE 10 MG/ML IJ SOLN
40.0000 mg | Freq: Once | INTRAMUSCULAR | Status: AC
  Administered 2021-05-11: 40 mg via INTRAVENOUS
  Filled 2021-05-11: qty 4

## 2021-05-11 MED ORDER — FUROSEMIDE 40 MG PO TABS: 40.0000 mg | ORAL_TABLET | Freq: Every day | ORAL | Status: DC

## 2021-05-11 NOTE — Progress Notes (Signed)
CARDIAC REHAB PHASE I   PRE:  Rate/Rhythm: 77 SR  BP:  Supine: 148/73  Sitting:   Standing:    SaO2: 96% RA  MODE:  Ambulation: 400 ft   POST:  Rate/Rhythm: 88 SR  BP:  Supine:   Sitting: 142/77  Standing:    SaO2: 92% RA post ambulation Tolerated ambulation well with minimal assistance, rolling walker.  Able to demonstrate IS 750-900 ml.  C/o legs feeling swollen and heavy.  Education completed re: referral to phase II cardiac rehab @ [facility], sternal precautions, activity restrictions, exercise progression, signs of infection, when to call the surgeon. [phone] Liliane Channel RN, BSN 05/11/2021 9:37 AM

## 2021-05-11 NOTE — Progress Notes (Addendum)
° °   °  [address]       [place] [address]             [phone]        4 Days Post-Op Procedure(s) (LRB): CORONARY ARTERY BYPASS GRAFTING (CABG) X 4, USING LEFT INTERNAL MAMMARY ARTERY, LEFT LEG GREATER SAPHENOUS VEIN HARVESTED ENDOSCOPICALLY (N/A) TRANSESOPHAGEAL ECHOCARDIOGRAM (TEE) (N/A) APPLICATION OF CELL SAVER (N/A) ENDOVEIN HARVEST OF GREATER SAPHENOUS VEIN (Left)  Subjective: Patient states breathing is ok.  Objective: Vital signs in last 24 hours: Temp:  [97.7 F (36.5 C)-99.6 F (37.6 C)] 98.4 F (36.9 C) (01/07 0410) Pulse Rate:  [76-93] 76 (01/07 0410) Cardiac Rhythm: Normal sinus rhythm (01/06 1912) Resp:  [15-20] 15 (01/07 0410) BP: (117-176)/(56-91) 117/56 (01/07 0410) SpO2:  [91 %-97 %] 92 % (01/07 0410) Weight:  [88.5 kg] 88.5 kg (01/07 0410)  Pre op weight 83.9 kg Current Weight  05/11/21 88.5 kg      Intake/Output from previous day: 01/06 0701 - 01/07 0700 In: 1160 [P.O.:1160] Out: -    Physical Exam:  Cardiovascular: RRR Pulmonary: Diminished bibasilar breath sounds Abdomen: Soft, non tender, bowel sounds present. Extremities: ++ bilateral lower extremity edema. Wounds: Aquacel removed and sternal wound is clean and dry.  No erythema or signs of infection. Bilateral LE wounds are clean and dry  Lab Results: CBC: Recent Labs    05/09/21 0500 05/10/21 0354  WBC 10.7* 11.0*  HGB 9.5* 10.5*  HCT 29.3* 30.9*  PLT 115* 149*   BMET:  Recent Labs    05/10/21 0354 05/11/21 0122  NA 136 139  K 4.1 3.9  CL 101 107  CO2 27 26  GLUCOSE 129* 135*  BUN 10 13  CREATININE 0.78 0.97  CALCIUM 8.1* 8.1*    PT/INR:  Lab Results  Component Value Date   INR 1.6 (H) 05/07/2021   INR 0.9 05/03/2021   INR 1.0 05/02/2020   ABG:  INR: Will add last result for INR, ABG once components are confirmed Will add last 4 CBG results once components are confirmed  Assessment/Plan:  1. CV - She had a fib early after  surgery. Maintaining SR. On Amiodarone 200 mg bid and Lopressor 25 mg bid 2.  Pulmonary - On room air most of yesterday. She has OSA and wears CPAP at night;desat's with sleeping so put on 1 L of oxgyen at night. S/p left thoracentesis with 300 ml removed. Encourage incentive spirometer. 3. Volume Overload - On Lasix 40 mg daily but will give IV this am 4.  Expected post op acute blood loss anemia - Last H and H stable at 10.5 and 30.9 5. CBGs 123/163/155. Pre op HGA1C 5.8. Stop accu checks and SS PRN. She likely has pre diabetes. Will need further surveillance after discharge. 6. Supplement potassium 7. Possible discharge in am  Donielle M ZimmermanPA-C 05/11/2021,7:41 AM   Doing well Home tomorrow  Lajuana Matte

## 2021-05-11 NOTE — Discharge Instructions (Signed)

## 2021-05-12 MED ORDER — FUROSEMIDE 40 MG PO TABS: 40.0000 mg | ORAL_TABLET | Freq: Every day | ORAL | Status: DC

## 2021-05-12 MED ORDER — FUROSEMIDE 10 MG/ML IJ SOLN
40.0000 mg | Freq: Once | INTRAMUSCULAR | Status: AC
Start: 2021-05-12 — End: 2021-05-12
  Administered 2021-05-12: 40 mg via INTRAVENOUS
  Filled 2021-05-12: qty 4

## 2021-05-12 NOTE — Progress Notes (Addendum)
° °   °  [address]       [place] [address]             [phone]        5 Days Post-Op Procedure(s) (LRB): CORONARY ARTERY BYPASS GRAFTING (CABG) X 4, USING LEFT INTERNAL MAMMARY ARTERY, LEFT LEG GREATER SAPHENOUS VEIN HARVESTED ENDOSCOPICALLY (N/A) TRANSESOPHAGEAL ECHOCARDIOGRAM (TEE) (N/A) APPLICATION OF CELL SAVER (N/A) ENDOVEIN HARVEST OF GREATER SAPHENOUS VEIN (Left)  Subjective: Patient walked one time yesterday. She states "I still have a lot of leg swelling".  Objective: Vital signs in last 24 hours: Temp:  [97.8 F (36.6 C)-98.9 F (37.2 C)] 97.8 F (36.6 C) (01/08 0515) Pulse Rate:  [78-100] 91 (01/08 0033) Cardiac Rhythm: Normal sinus rhythm (01/07 1944) Resp:  [18-21] 20 (01/08 0515) BP: (142-167)/(69-87) 163/86 (01/08 0515) SpO2:  [90 %-95 %] 93 % (01/08 0515) Weight:  [87.4 kg] 87.4 kg (01/08 0236)  Pre op weight 83.9 kg Current Weight  05/12/21 87.4 kg      Intake/Output from previous day: 01/07 0701 - 01/08 0700 In: 2.8 [I.V.:2.8] Out: 1500 [Urine:1500]   Physical Exam:  Cardiovascular: RRR Pulmonary: Slightly diminished bibasilar breath sounds Abdomen: Soft, non tender, bowel sounds present. Extremities: ++ bilateral lower extremity edema. Wounds: Sternal wound is clean and dry.  No erythema or signs of infection. Bilateral LE wounds are clean and dry  Lab Results: CBC: Recent Labs    05/10/21 0354  WBC 11.0*  HGB 10.5*  HCT 30.9*  PLT 149*    BMET:  Recent Labs    05/10/21 0354 05/11/21 0122  NA 136 139  K 4.1 3.9  CL 101 107  CO2 27 26  GLUCOSE 129* 135*  BUN 10 13  CREATININE 0.78 0.97  CALCIUM 8.1* 8.1*     PT/INR:  Lab Results  Component Value Date   INR 1.6 (H) 05/07/2021   INR 0.9 05/03/2021   INR 1.0 05/02/2020   ABG:  INR: Will add last result for INR, ABG once components are confirmed Will add last 4 CBG results once components are confirmed  Assessment/Plan:  1. CV - She had a  fib early after surgery. Maintaining SR but more hypertensive.On Amiodarone 200 mg bid and Lopressor 25 mg bid. Will restart Amlodipine. Has allergy to ACE/ARB 2.  Pulmonary - On room air most of yesterday. She has OSA and wears CPAP at nigh. S/p left thoracentesis 01/06 with 300 ml removed. Encourage incentive spirometer. 3. Volume Overload - Will give Lasix IV again this am 4.  Expected post op acute blood loss anemia - Last H and H stable at 10.5 and 30.9 5. Will discuss disposition with Dr. Janne Lab M ZimmermanPA-C 05/12/2021,7:52 AM   Agree with above. Ready for discharge with patient has some concern about ambulation. Will titrate blood pressure medication today. [place]

## 2021-05-12 NOTE — Progress Notes (Signed)
Mobility Specialist Progress Note:   05/12/21 0948  Mobility  Activity Ambulated in hall  Level of Assistance Standby assist, set-up cues, supervision of patient - no hands on  Assistive Device Front wheel walker  Distance Ambulated (ft) 400 ft  Mobility Ambulated with assistance in hallway  Mobility Response Tolerated well  Mobility performed by Mobility specialist  Bed Position Chair  $Mobility charge 1 Mobility   PT received in chair willing to participate in mobility. No complaints of pain. Required a couple standing rest breaks. Returned to chair with call bell in reach and all needs met.   [facility] Public librarian Phone [phone] Secondary Phone [phone]

## 2021-05-13 ENCOUNTER — Other Ambulatory Visit: Payer: Self-pay | Admitting: *Deleted

## 2021-05-13 DIAGNOSIS — Z951 Presence of aortocoronary bypass graft: Secondary | ICD-10-CM

## 2021-05-13 MED ORDER — AMIODARONE HCL 200 MG PO TABS: 200.0000 mg | ORAL_TABLET | Freq: Two times a day (BID) | ORAL | 1 refills | Status: DC

## 2021-05-13 MED ORDER — POTASSIUM CHLORIDE CRYS ER 20 MEQ PO TBCR: 20.0000 meq | EXTENDED_RELEASE_TABLET | Freq: Every day | ORAL | 0 refills | Status: DC

## 2021-05-13 MED ORDER — FUROSEMIDE 40 MG PO TABS: 40.0000 mg | ORAL_TABLET | Freq: Every day | ORAL | 0 refills | Status: DC

## 2021-05-13 MED ORDER — FUROSEMIDE 40 MG PO TABS: 40.0000 mg | ORAL_TABLET | Freq: Every day | ORAL | Status: DC

## 2021-05-13 MED ORDER — OXYCODONE HCL 5 MG PO TABS: 5.0000 mg | ORAL_TABLET | ORAL | 0 refills | Status: AC | PRN

## 2021-05-13 MED ORDER — FUROSEMIDE 10 MG/ML IJ SOLN
40.0000 mg | Freq: Once | INTRAMUSCULAR | Status: AC
  Administered 2021-05-13: 40 mg via INTRAVENOUS
  Filled 2021-05-13: qty 4

## 2021-05-13 MED ORDER — ASPIRIN 325 MG PO TBEC: 325.0000 mg | DELAYED_RELEASE_TABLET | Freq: Every day | ORAL | Status: AC

## 2021-05-13 NOTE — Progress Notes (Signed)
Reviewed ed with pt. She demonstrated walking in room without RW. Elevated pts legs and donned compression stockings.  [phone] [place], ACSM 10:22 AM 05/13/2021

## 2021-05-13 NOTE — Progress Notes (Signed)
° °   °  [address]       [place] [address]             [phone]      6 Days Post-Op Procedure(s) (LRB): CORONARY ARTERY BYPASS GRAFTING (CABG) X 4, USING LEFT INTERNAL MAMMARY ARTERY, LEFT LEG GREATER SAPHENOUS VEIN HARVESTED ENDOSCOPICALLY (N/A) TRANSESOPHAGEAL ECHOCARDIOGRAM (TEE) (N/A) APPLICATION OF CELL SAVER (N/A) ENDOVEIN HARVEST OF GREATER SAPHENOUS VEIN (Left)  Subjective:  Patient feeling better.  Her swelling is improved in her legs.  She feels up to going home today.  Objective: Vital signs in last 24 hours: Temp:  [97.8 F (36.6 C)-100 F (37.8 C)] 98.1 F (36.7 C) (01/09 0348) Pulse Rate:  [85-98] 85 (01/08 2352) Cardiac Rhythm: Normal sinus rhythm (01/08 1900) Resp:  [18-20] 18 (01/09 0348) BP: (140-183)/(72-99) 149/85 (01/09 0348) SpO2:  [91 %-95 %] 93 % (01/09 0348) Weight:  [86.6 kg-86.7 kg] 86.6 kg (01/09 0234)  Intake/Output from previous day: 01/08 0701 - 01/09 0700 In: 480 [P.O.:480] Out: 550 [Urine:550]  General appearance: alert, cooperative, and no distress Heart: regular rate and rhythm Lungs: clear to auscultation bilaterally Abdomen: soft, non-tender; bowel sounds normal; no masses,  no organomegaly Extremities: edema trace-1+ Wound: clean and dry  Lab Results: No results for input(s): WBC, HGB, HCT, PLT in the last 72 hours. BMET:  Recent Labs    05/11/21 0122  NA 139  K 3.9  CL 107  CO2 26  GLUCOSE 135*  BUN 13  CREATININE 0.97  CALCIUM 8.1*    PT/INR: No results for input(s): LABPROT, INR in the last 72 hours. ABG    Component Value Date/Time   PHART 7.382 05/08/2021 0902   HCO3 13.5 (L) 05/08/2021 0902   TCO2 14 (L) 05/08/2021 0902   ACIDBASEDEF 10.0 (H) 05/08/2021 0902   O2SAT 99.0 05/08/2021 0902   CBG (last 3)  Recent Labs    05/10/21 2101 05/11/21 0612 05/11/21 1122  GLUCAP 163* 155* 154*    Assessment/Plan: S/P Procedure(s) (LRB): CORONARY ARTERY BYPASS GRAFTING (CABG) X 4, USING  LEFT INTERNAL MAMMARY ARTERY, LEFT LEG GREATER SAPHENOUS VEIN HARVESTED ENDOSCOPICALLY (N/A) TRANSESOPHAGEAL ECHOCARDIOGRAM (TEE) (N/A) APPLICATION OF CELL SAVER (N/A) ENDOVEIN HARVEST OF GREATER SAPHENOUS VEIN (Left)  CV- NSR, + HTN- continue Amiodarone, lopressor, NorVASC Pulm- no acute issues, off oxygen, continue IS Renal- remains volume overloaded, but this is improving, will place TED hose and continue diuretics Expected post operative blood loss anemia, has been stable Dispo- patient stable, will d/c home today   LOS: 6 days    Ellwood Handler 05/13/2021

## 2021-05-13 NOTE — Plan of Care (Signed)
  Problem: Education: Goal: Knowledge of General Education information will improve Description: Including pain rating scale, medication(s)/side effects and non-pharmacologic comfort measures Outcome: Adequate for Discharge   Problem: Health Behavior/Discharge Planning: Goal: Ability to manage health-related needs will improve Outcome: Adequate for Discharge   Problem: Clinical Measurements: Goal: Ability to maintain clinical measurements within normal limits will improve Outcome: Adequate for Discharge Goal: Will remain free from infection Outcome: Adequate for Discharge Goal: Diagnostic test results will improve Outcome: Adequate for Discharge Goal: Respiratory complications will improve Outcome: Adequate for Discharge Goal: Cardiovascular complication will be avoided Outcome: Adequate for Discharge   Problem: Activity: Goal: Risk for activity intolerance will decrease Outcome: Adequate for Discharge   Problem: Nutrition: Goal: Adequate nutrition will be maintained Outcome: Adequate for Discharge   Problem: Coping: Goal: Level of anxiety will decrease Outcome: Adequate for Discharge   Problem: Elimination: Goal: Will not experience complications related to bowel motility Outcome: Adequate for Discharge Goal: Will not experience complications related to urinary retention Outcome: Adequate for Discharge   Problem: Pain Managment: Goal: General experience of comfort will improve Outcome: Adequate for Discharge   Problem: Safety: Goal: Ability to remain free from injury will improve Outcome: Adequate for Discharge   Problem: Skin Integrity: Goal: Risk for impaired skin integrity will decrease Outcome: Adequate for Discharge   Problem: Education: Goal: Will demonstrate proper wound care and an understanding of methods to prevent future damage Outcome: Adequate for Discharge Goal: Knowledge of disease or condition will improve Outcome: Adequate for Discharge Goal:  Knowledge of the prescribed therapeutic regimen will improve Outcome: Adequate for Discharge Goal: Individualized Educational Video(s) Outcome: Adequate for Discharge   Problem: Activity: Goal: Risk for activity intolerance will decrease Outcome: Adequate for Discharge   Problem: Cardiac: Goal: Will achieve and/or maintain hemodynamic stability Outcome: Adequate for Discharge   Problem: Clinical Measurements: Goal: Postoperative complications will be avoided or minimized Outcome: Adequate for Discharge   Problem: Respiratory: Goal: Respiratory status will improve Outcome: Adequate for Discharge   Problem: Skin Integrity: Goal: Wound healing without signs and symptoms of infection Outcome: Adequate for Discharge Goal: Risk for impaired skin integrity will decrease Outcome: Adequate for Discharge   Problem: Urinary Elimination: Goal: Ability to achieve and maintain adequate renal perfusion and functioning will improve Outcome: Adequate for Discharge   

## 2021-05-13 NOTE — Progress Notes (Signed)
Removed patients sutures and gave patient discharge instructions.  Patient ststed understanding.

## 2021-05-15 ENCOUNTER — Ambulatory Visit: Payer: BC Managed Care – PPO

## 2021-05-16 NOTE — Addendum Note (Signed)
Addendum  created 05/16/21 1843 by Belinda Block, MD   Intraprocedure Staff edited

## 2021-05-20 ENCOUNTER — Other Ambulatory Visit: Payer: BC Managed Care – PPO

## 2021-05-21 ENCOUNTER — Ambulatory Visit: Payer: BC Managed Care – PPO | Admitting: Pulmonary Disease

## 2021-05-23 ENCOUNTER — Other Ambulatory Visit: Payer: Self-pay | Admitting: Internal Medicine

## 2021-05-23 ENCOUNTER — Ambulatory Visit: Payer: BC Managed Care – PPO | Admitting: Internal Medicine

## 2021-05-23 NOTE — Telephone Encounter (Signed)
Please confirm with Susan Weeks if she is taking.  Per her recent discharge summary, this was discontinued.  Please confirm with her.

## 2021-05-23 NOTE — Telephone Encounter (Signed)
Looks like this was discontinued at hospital discharge.

## 2021-05-24 ENCOUNTER — Other Ambulatory Visit: Payer: Self-pay

## 2021-05-24 ENCOUNTER — Ambulatory Visit (INDEPENDENT_AMBULATORY_CARE_PROVIDER_SITE_OTHER): Payer: Self-pay | Admitting: Thoracic Surgery (Cardiothoracic Vascular Surgery)

## 2021-05-24 DIAGNOSIS — Z951 Presence of aortocoronary bypass graft: Secondary | ICD-10-CM

## 2021-05-24 NOTE — Progress Notes (Signed)
°   °  [address]       [place] [address]             [phone]       Patient: Home Provider: Office Consent for Telemedicine visit obtained.  Todays visit was completed via a real-time telehealth (see specific modality noted below). The patient/authorized person provided oral consent at the time of the visit to engage in a telemedicine encounter with the present provider at [facility]. The patient/authorized person was informed of the potential benefits, limitations, and risks of telemedicine. The patient/authorized person expressed understanding that the laws that protect confidentiality also apply to telemedicine. The patient/authorized person acknowledged understanding that telemedicine does not provide emergency services and that he or she would need to call 911 or proceed to the nearest hospital for help if such a need arose.   Total time spent in the clinical discussion 10 minutes.  Telehealth Modality: Phone visit (audio only)  I had a telephone visit with  Susan Weeks who is s/p CABG.  Overall doing well.   Pain is minimal.  Ambulating well. Vitals have been  stable.  Susan Weeks will see [place] back in 1 month with a chest x-ray for cardiac rehab clearance.  [name]

## 2021-05-28 ENCOUNTER — Telehealth: Payer: Self-pay

## 2021-05-28 NOTE — Telephone Encounter (Signed)
Transition Care Management Unsuccessful Follow-up Telephone Call  Date of discharge and from where:  05/13/21 from [facility]  Attempts:  1st Attempt  Reason for unsuccessful TCM follow-up call:  Left voice message  Thea Silversmith, RN, MSN, BSN, [place] Care Management Coordinator [phone]

## 2021-05-30 ENCOUNTER — Telehealth: Payer: Self-pay | Admitting: Oncology

## 2021-05-30 ENCOUNTER — Telehealth: Payer: Self-pay | Admitting: *Deleted

## 2021-05-30 ENCOUNTER — Other Ambulatory Visit: Payer: Self-pay | Admitting: *Deleted

## 2021-05-30 ENCOUNTER — Encounter (HOSPITAL_COMMUNITY): Payer: Self-pay | Admitting: Thoracic Surgery (Cardiothoracic Vascular Surgery)

## 2021-05-30 DIAGNOSIS — D751 Secondary polycythemia: Secondary | ICD-10-CM

## 2021-05-30 DIAGNOSIS — K769 Liver disease, unspecified: Secondary | ICD-10-CM

## 2021-05-30 DIAGNOSIS — Z85038 Personal history of other malignant neoplasm of large intestine: Secondary | ICD-10-CM

## 2021-05-30 NOTE — Telephone Encounter (Signed)
Transition Care Management Unsuccessful Follow-up Telephone Call  Date of discharge and from where:  [facility]  05/13/2021  Attempts:  2nd Attempt  Reason for unsuccessful TCM follow-up call:  No answer/busy  Jacqlyn Larsen [facility], BSN RN Case Manager [phone]

## 2021-05-30 NOTE — Addendum Note (Signed)
Addendum  created 05/30/21 2145 by Belinda Block, MD   Intraprocedure Event deleted, Intraprocedure Staff edited

## 2021-05-30 NOTE — Telephone Encounter (Signed)
Pt called and wants to change her appt for 2-3 to 2-7. She has another appt on 2-7 and wants to have them all in one day. Call back at [phone]

## 2021-05-31 ENCOUNTER — Encounter (HOSPITAL_COMMUNITY): Payer: Self-pay | Admitting: Thoracic Surgery (Cardiothoracic Vascular Surgery)

## 2021-05-31 ENCOUNTER — Telehealth: Payer: Self-pay | Admitting: Oncology

## 2021-05-31 NOTE — Telephone Encounter (Signed)
Pt called to reschedule her appt for 2-3. Call back at [phone]

## 2021-05-31 NOTE — Addendum Note (Signed)
Addendum  created 05/31/21 1136 by Josephine Igo, CRNA   Intraprocedure Event edited, Intraprocedure Staff edited

## 2021-06-03 ENCOUNTER — Other Ambulatory Visit: Payer: BC Managed Care – PPO

## 2021-06-04 ENCOUNTER — Other Ambulatory Visit: Payer: Self-pay | Admitting: Physician Assistant

## 2021-06-07 ENCOUNTER — Other Ambulatory Visit: Payer: BC Managed Care – PPO

## 2021-06-07 ENCOUNTER — Ambulatory Visit: Payer: BC Managed Care – PPO | Admitting: Oncology

## 2021-06-07 NOTE — Telephone Encounter (Signed)
HCTZ not on med list

## 2021-06-11 ENCOUNTER — Ambulatory Visit (INDEPENDENT_AMBULATORY_CARE_PROVIDER_SITE_OTHER): Payer: BC Managed Care – PPO | Admitting: Nurse Practitioner

## 2021-06-11 ENCOUNTER — Encounter: Payer: Self-pay | Admitting: Nurse Practitioner

## 2021-06-11 ENCOUNTER — Other Ambulatory Visit: Payer: Self-pay

## 2021-06-11 VITALS — BP 130/80 | HR 79 | Ht 63.0 in | Wt 178.2 lb

## 2021-06-11 DIAGNOSIS — I10 Essential (primary) hypertension: Secondary | ICD-10-CM | POA: Diagnosis not present

## 2021-06-11 DIAGNOSIS — I480 Paroxysmal atrial fibrillation: Secondary | ICD-10-CM

## 2021-06-11 DIAGNOSIS — E785 Hyperlipidemia, unspecified: Secondary | ICD-10-CM | POA: Diagnosis not present

## 2021-06-11 DIAGNOSIS — I2510 Atherosclerotic heart disease of native coronary artery without angina pectoris: Secondary | ICD-10-CM | POA: Diagnosis not present

## 2021-06-11 DIAGNOSIS — G4733 Obstructive sleep apnea (adult) (pediatric): Secondary | ICD-10-CM

## 2021-06-11 MED ORDER — ROSUVASTATIN CALCIUM 10 MG PO TABS: 10.0000 mg | ORAL_TABLET | Freq: Every day | ORAL | 3 refills | Status: DC

## 2021-06-11 NOTE — Patient Instructions (Signed)
Medication Instructions:  Your physician has recommended you make the following change in your medication:  1.Increase rosuvastatin (Crestor) to 10 mg, take one tablet by mouth daily *If you need a refill on your cardiac medications before your next appointment, please call your pharmacy*   Lab Work: None If you have labs (blood work) drawn today and your tests are completely normal, you will receive your results only by: [place] (if you have MyChart) OR A paper copy in the mail If you have any lab test that is abnormal or we need to change your treatment, we will call you to review the results.   Follow-Up: At [facility], you and your health needs are our priority.  As part of our continuing mission to provide you with exceptional heart care, we have created designated Provider Care Teams.  These Care Teams include your primary Cardiologist (physician) and Advanced Practice Providers (APPs -  Physician Assistants and Nurse Practitioners) who all work together to provide you with the care you need, when you need it.   Your next appointment:   4-6 week(s)  The format for your next appointment:   In Person  Provider:   [name], MD or Murray Hodgkins, NP{

## 2021-06-11 NOTE — Progress Notes (Signed)
Office Visit    Patient Name: Susan Weeks Date of Encounter: 06/11/2021  Primary Care Provider:  [name], MD Primary Cardiologist:  Susan Sacramento, MD  Chief Complaint    71 year old female with a history of CAD, hypertension, hyperlipidemia, erythrocytosis, sleep apnea, and colon cancer, who presents for follow-up after recent CABG x4.  Past Medical History    Past Medical History:  Diagnosis Date   Abnormal liver function    Colon cancer ([place])    adenocarcinoma of the cecum s/p partial colon resection   Coronary artery disease    a. 03/2021 Cor CTA: sev LAD dzs; b. 04/2021 Cath: lM nl, LAD 111m (unable to wire), D1 60, LCX nl, OM3 60, LPAV 100, RCA 90p; c. 05/2021 CABG x 4: LIMA->LAD, VG->RPDA, VG->OM1, VG->OM3.   History of colon polyps    Hyperlipidemia    Hypertension    Post-operative Atrial Fibrillation ([place])    a. 05/2021 Brief PAF after CABG-->amio.   Sleep apnea 34/19/3790   Systolic murmur    a. 24/0973 Echo: EF 60-65%, no rwma, GrI DD, RVSP 28.72mmHg. Mild-mod MR.   Past Surgical History:  Procedure Laterality Date   ABDOMINAL HYSTERECTOMY  1984   secondary to bleeding and fibroid tumors   APPENDECTOMY     COLONOSCOPY W/ POLYPECTOMY  2001   COLONOSCOPY WITH PROPOFOL N/A 07/28/2016   Procedure: COLONOSCOPY WITH PROPOFOL;  Surgeon: Susan Sails, MD;  Location: [facility] ENDOSCOPY;  Service: Endoscopy;  Laterality: N/A;   CORONARY ARTERY BYPASS GRAFT N/A 05/07/2021   Procedure: CORONARY ARTERY BYPASS GRAFTING (CABG) X 4, USING LEFT INTERNAL MAMMARY ARTERY, LEFT LEG GREATER SAPHENOUS VEIN HARVESTED ENDOSCOPICALLY;  Surgeon: Susan Matte, MD;  Location: [place];  Service: Open Heart Surgery;  Laterality: N/A;   CORONARY STENT INTERVENTION N/A 04/15/2021   Procedure: CORONARY STENT INTERVENTION;  Surgeon: Susan Hampshire, MD;  Location: [place] [facility];  Service: Cardiovascular;  Laterality: N/A;   DILATION AND CURETTAGE OF UTERUS  1967    ENDOVEIN HARVEST OF GREATER SAPHENOUS VEIN Left 05/07/2021   Procedure: ENDOVEIN HARVEST OF GREATER SAPHENOUS VEIN;  Surgeon: Susan Matte, MD;  Location: [place];  Service: Open Heart Surgery;  Laterality: Left;   IR THORACENTESIS ASP PLEURAL SPACE W/IMG GUIDE  05/10/2021   LEFT HEART CATH AND CORONARY ANGIOGRAPHY N/A 04/15/2021   Procedure: LEFT HEART CATH AND CORONARY ANGIOGRAPHY;  Surgeon: Susan Hampshire, MD;  Location: [place] [facility];  Service: Cardiovascular;  Laterality: N/A;   TEE WITHOUT CARDIOVERSION N/A 05/07/2021   Procedure: TRANSESOPHAGEAL ECHOCARDIOGRAM (TEE);  Surgeon: Susan Matte, MD;  Location: [place];  Service: Open Heart Surgery;  Laterality: N/A;    Allergies  Allergies  Allergen Reactions   Tape     Bruising from paper tape   Lisinopril Rash    Developed slight facial swelling and itching over forehead while taking. Unclear if definite ACE allergy, but concern enough to stop in ER today.   Z-Pak [Azithromycin] Nausea And Vomiting    Caused Emesis    History of Present Illness    71 year old female with a history of CAD, hypertension, hyperlipidemia, erythrocytosis, sleep apnea, and colon cancer.  She was evaluated by Dr. Fletcher Weeks in November 2022 in the setting of exertional dyspnea, increasing leg edema, and an episode of chest fullness.  She underwent coronary CT angiography which showed severe LAD disease.  Echocardiogram showed normal LV function with grade 1 diastolic dysfunction and mild to moderate  mitral regurgitation.  She subsequently underwent diagnostic catheterization in December 2022, revealing an occluded LAD and severe RCA disease.  Initially, there was an effort to perform PCI in the LAD with plan for staged PCI of the RCA however, the interventional team was unable to cross the LAD with a wire and decision was made to refer for cardiothoracic surgical evaluation.  Patient subsequently underwent CABG x4 on May 07, 2021.   Notes indicate  that she had a brief episode of atrial fibrillation shortly after surgery, and this has been managed with oral amiodarone.  After an otherwise relatively uneventful postoperative course, she was discharged January 9.  Ms. Susan Weeks is doing well at her postoperative phone visit with Dr. Kipp Weeks on January 20.  She notes that she has continued to do well.  She initially had significant bruising and swelling in her left lateral thigh with mild left greater than right lower extremity swelling, but both of these issues have improved significantly.  She has only trace edema on exam today.  She has been wearing compression hose at home and try to keep her legs elevated when possible.  She has been walking some in her home without any angina or dyspnea.  She has minimal incisional discomfort and feels that all of her wounds have healed well.  She denies palpitations, PND, orthopnea, dizziness, syncope, or early satiety.  Home Medications    Current Outpatient Medications  Medication Sig Dispense Refill   acetaminophen (TYLENOL) 500 MG tablet Take 500 mg by mouth every 6 (six) hours as needed for moderate pain.     amiodarone (PACERONE) 200 MG tablet Take 200 mg by mouth daily.     amLODipine (NORVASC) 5 MG tablet TAKE 1 TABLET BY MOUTH EVERY DAY 90 tablet 1   aspirin EC 325 MG EC tablet Take 1 tablet (325 mg total) by mouth daily.     Calcium Carbonate-Vitamin D (CALCIUM 600+D PO) Take 1 tablet by mouth daily.     metoprolol tartrate (LOPRESSOR) 25 MG tablet Take 1 tablet (25 mg total) by mouth 2 (two) times daily. 60 tablet 11   NON FORMULARY Pt uses a cpap nightly     rosuvastatin (CRESTOR) 10 MG tablet Take 1 tablet (10 mg total) by mouth daily. 90 tablet 3   vitamin E 400 UNIT capsule Take 400 Units by mouth daily.      furosemide (LASIX) 40 MG tablet Take 1 tablet (40 mg total) by mouth daily. (Patient not taking: Reported on 06/11/2021) 7 tablet 0   potassium chloride SA (KLOR-CON M) 20 MEQ tablet Take  1 tablet (20 mEq total) by mouth daily. (Patient not taking: Reported on 06/11/2021) 7 tablet 0   No current facility-administered medications for this visit.     Review of Systems    Mild lower extremity swelling, which has improved.  She denies chest pain, dyspnea, palpitations, PND, orthopnea, dizziness, syncope, or early satiety.  All other systems reviewed and are otherwise negative except as noted above.   Cardiac Rehabilitation Eligibility Assessment  The patient is ready to start cardiac rehabilitation pending clearance from the cardiac surgeon.    Physical Exam    VS:  BP 130/80    Pulse 79    Ht 5\' 3"  (1.6 m)    Wt 178 lb 3.2 oz (80.8 kg)    LMP 06/28/1982    SpO2 97%    BMI 31.57 kg/m  , BMI Body mass index is 31.57 kg/m.  GEN: Well nourished, well developed, in no acute distress. HEENT: normal. Neck: Supple, no JVD, carotid bruits, or masses. Cardiac: RRR, 2/6 systolic murmur at the upper sternal borders, no rubs or gallops. No clubbing, cyanosis, trace bilateral lower extremity edema.  Radials/PT 2+ and equal bilaterally.  Sternal incision bilateral lower leg incisions have healed well without erythema or drainage. Respiratory:  Respirations regular and unlabored, clear to auscultation bilaterally. GI: Soft, nontender, nondistended, BS + x 4.  Small abdominal incisions have healed well without erythema or drainage. MS: no deformity or atrophy. Skin: warm and dry, no rash. Neuro:  Strength and sensation are intact. Psych: Normal affect.  Accessory Clinical Findings    ECG personally reviewed by me today -regular sinus rhythm, 79, leftward axis - no acute changes.  Lab Results  Component Value Date   WBC 11.0 (H) 05/10/2021   HGB 10.5 (L) 05/10/2021   HCT 30.9 (L) 05/10/2021   MCV 87.8 05/10/2021   PLT 149 (L) 05/10/2021   Lab Results  Component Value Date   CREATININE 0.97 05/11/2021   BUN 13 05/11/2021   NA 139 05/11/2021   K 3.9 05/11/2021   CL 107  05/11/2021   CO2 26 05/11/2021   Lab Results  Component Value Date   ALT 37 05/03/2021   AST 30 05/03/2021   ALKPHOS 62 05/03/2021   BILITOT 1.2 05/03/2021   Lab Results  Component Value Date   CHOL 147 11/30/2020   HDL 61 11/30/2020   LDLCALC 70 11/30/2020   LDLDIRECT 150.8 03/02/2013   TRIG 79 11/30/2020   CHOLHDL 2.4 11/30/2020    Lab Results  Component Value Date   HGBA1C 5.8 (H) 05/03/2021    Assessment & Plan    1.  Coronary artery disease: Status post CABG x4 on January 3.  She has been doing well at home without recurrent angina or dyspnea.  She has trace lower extremity swelling in the setting of saphenous vein graft harvesting, which she has recently been managing with compression socks and leg elevation.  She remains on aspirin, beta-blocker, and statin therapy.  In the setting of an LDL of 70 in July 2022, I am titrating rosuvastatin to 10 mg daily with plan for follow-up lipids and LFTs in approximately 6 weeks.  She has follow-up with thoracic surgery later this month and is looking forward to participating in cardiac rehab.  2.  Postoperative atrial fibrillation: Notes indicate the patient had a brief episode of atrial fibrillation in the early postoperative setting.  She is on amiodarone 200 mg daily at this time.  We discussed that provided she has no further arrhythmia, this will likely be discontinued after she completes her current bottle.  Continue beta-blocker.  She is on full-strength aspirin.  3.  Essential hypertension: Blood pressure stable today at 130/80 on beta-blocker and amlodipine therapy.  4.  Hyperlipidemia: LDL of 70 in July 2022 on rosuvastatin 5 mg daily.  Given diagnosis of coronary disease status post recent CABG, we discussed a more aggressive approach to lipid management and I am going to increase her rosuvastatin to 10 mg daily.  I will plan to follow-up lipids and LFTs in approximately 6 weeks.  5.  Obstructive sleep apnea: On CPAP.  6.   Liver lesions: Patient incidentally noted to have 2 hypervascular lesions in the liver on coronary CTA.  She is followed by heme-onc.  7.  Disposition: Follow-up in clinic with lipids and LFTs at that time in approximately 6 weeks.  Murray Hodgkins, NP 06/11/2021, 9:24 AM

## 2021-06-11 NOTE — Progress Notes (Signed)
Has TCTS f/u later this month.

## 2021-06-18 ENCOUNTER — Telehealth: Payer: Self-pay

## 2021-06-18 NOTE — Telephone Encounter (Signed)
I'm sorry to hear that she's having leg pain on the higher dose of crestor.  Ok to reduce back down to 5mg  daily, since she tolerated that previously.  I'd like her to consider starting zetia 10mg  daily, in addition to crestor, once her leg symptoms have improved.  This will aid in getting her lipids to goal, and shouldn't cause leg aching.

## 2021-06-18 NOTE — Telephone Encounter (Signed)
Patient calling stating that since increasing Crestor at the last OV with  Ignacia Bayley, NP -- she has been having more leg pain. Wants to know if she can go back to the original dose of Crestor 5MG .

## 2021-06-19 NOTE — Telephone Encounter (Signed)
Called and left a VM requesting a call back. °

## 2021-06-19 NOTE — Telephone Encounter (Signed)
Patient returning call.

## 2021-06-19 NOTE — Telephone Encounter (Signed)
Spoke with patient and she stated that the leg pain she was having was also in the leg she recently had a procedure on. She took Crestor 10 MG last night and did not feel the pain she had the previous night and will continue to take and monitor for symptoms. Patient stated that she will notify us if the pain comes back and she needs to cut back to 5 MG dose, and possibly start Zetia.  Patient was very grateful for the call back.

## 2021-06-25 ENCOUNTER — Other Ambulatory Visit: Payer: Self-pay | Admitting: Thoracic Surgery (Cardiothoracic Vascular Surgery)

## 2021-06-25 DIAGNOSIS — Z951 Presence of aortocoronary bypass graft: Secondary | ICD-10-CM

## 2021-06-26 ENCOUNTER — Ambulatory Visit (INDEPENDENT_AMBULATORY_CARE_PROVIDER_SITE_OTHER): Payer: Self-pay | Admitting: Physician Assistant

## 2021-06-26 ENCOUNTER — Ambulatory Visit
Admission: RE | Admit: 2021-06-26 | Discharge: 2021-06-26 | Disposition: A | Discharge status: Home / Self Care | Payer: BC Managed Care – PPO | Source: Ambulatory Visit | Attending: Thoracic Surgery (Cardiothoracic Vascular Surgery) | Admitting: Thoracic Surgery (Cardiothoracic Vascular Surgery)

## 2021-06-26 ENCOUNTER — Other Ambulatory Visit: Payer: Self-pay

## 2021-06-26 VITALS — BP 149/92 | HR 86 | Resp 20 | Ht 63.0 in | Wt 176.0 lb

## 2021-06-26 DIAGNOSIS — Z951 Presence of aortocoronary bypass graft: Secondary | ICD-10-CM

## 2021-06-26 DIAGNOSIS — I2510 Atherosclerotic heart disease of native coronary artery without angina pectoris: Secondary | ICD-10-CM

## 2021-06-26 DIAGNOSIS — J90 Pleural effusion, not elsewhere classified: Secondary | ICD-10-CM

## 2021-06-26 MED ORDER — FUROSEMIDE 40 MG PO TABS: 40.0000 mg | ORAL_TABLET | Freq: Every day | ORAL | 1 refills | Status: DC

## 2021-06-26 MED ORDER — POTASSIUM CHLORIDE CRYS ER 20 MEQ PO TBCR: 20.0000 meq | EXTENDED_RELEASE_TABLET | Freq: Every day | ORAL | 1 refills | Status: DC

## 2021-06-26 NOTE — Patient Instructions (Signed)
You may return to driving an automobile as long as you are no longer requiring oral narcotic pain relievers during the daytime.  It would be wise to start driving only short distances during the daylight and gradually increase from there as you feel comfortable.  Make every effort to maintain a "heart-healthy" lifestyle with regular physical exercise and adherence to a low-fat, low-carbohydrate diet.  Continue to seek regular follow-up appointments with your primary care physician and/or cardiologist.  You may continue to gradually increase your physical activity as tolerated.  Refrain from any heavy lifting or strenuous use of your arms and shoulders until at least 8 weeks from the time of your surgery, and avoid activities that cause increased pain in your chest on the side of your surgical incision.  Otherwise you may continue to increase activities without any particular limitations.  Increase the intensity and duration of physical activity gradually.  You are encouraged to enroll and participate in the outpatient cardiac rehab program beginning as soon as practical.

## 2021-06-26 NOTE — Progress Notes (Signed)
° °   °  [address]       [place] [address]             [phone]     HPI:  Susan Weeks is S/P CABG x 4 on 05/07/2021.  The patient's hospital course was complicated by Atrial Fibrillation which converted with oral Amiodarone.  She had a pleural effusion treated with Thoracentesis.  She was hypertensive and started on Norvasc for ACE/ARB allergy.  The patient presents today for follow up.  Overall she is doing very well.  She states she feels better than she did prior to surgery.  She denies pain and shortness of breath.  Her surgical incisions are healing without evidence of infection.  She is interested in cardiac rehab.  Current Outpatient Medications  Medication Sig Dispense Refill   acetaminophen (TYLENOL) 500 MG tablet Take 500 mg by mouth every 6 (six) hours as needed for moderate pain.     amiodarone (PACERONE) 200 MG tablet Take 200 mg by mouth daily.     amLODipine (NORVASC) 5 MG tablet TAKE 1 TABLET BY MOUTH EVERY DAY 90 tablet 1   aspirin EC 325 MG EC tablet Take 1 tablet (325 mg total) by mouth daily.     Calcium Carbonate-Vitamin D (CALCIUM 600+D PO) Take 1 tablet by mouth daily.     furosemide (LASIX) 40 MG tablet Take 1 tablet (40 mg total) by mouth daily. (Patient not taking: Reported on 06/11/2021) 7 tablet 0   metoprolol tartrate (LOPRESSOR) 25 MG tablet Take 1 tablet (25 mg total) by mouth 2 (two) times daily. 60 tablet 11   NON FORMULARY Pt uses a cpap nightly     potassium chloride SA (KLOR-CON M) 20 MEQ tablet Take 1 tablet (20 mEq total) by mouth daily. (Patient not taking: Reported on 06/11/2021) 7 tablet 0   rosuvastatin (CRESTOR) 10 MG tablet Take 1 tablet (10 mg total) by mouth daily. 90 tablet 3   vitamin E 400 UNIT capsule Take 400 Units by mouth daily.      No current facility-administered medications for this visit.    Physical Exam:  BP (!) 149/92 (BP Location: Left Arm, Patient Position: Sitting)    Pulse 86    Resp 20    Ht 5\' 3"  (1.6  m)    Wt 176 lb (79.8 kg)    LMP 06/28/1982    SpO2 92% Comment: RA   BMI 31.18 kg/m   Gen: no apparent distress Heart: RRR Lungs: diminished left base Ext: + edema Incisions: Sternotomy well healed, Right EVH site with suture abscess with mild purulence and suture being expelled  Diagnostic Tests:  Significant accumulation of pleural fluid on the left with atelectasis  A/P:  S/P CABG x 4- overall doing very well.  She is asymptomatic and feels like she is doing better than before surgery Large left pleural effusion- surprisingly patient is asymptomatic, this will require Thoracentesis, will also give 7 days of Lasix and then use prn for weight gain Left EVH suture abscess- minimal purulence noted, vicryl suture sticking out... suture cute and removed, area cleaned.. should resolve  Activity- okay to start cardiac rehab, okay to resume driving as patient is no longer using pain medication, further recovery instructions provided... will have her come back to clinic is 3 weeks with repeat CXR   Ellwood Handler, PA-C [facility] [phone]

## 2021-06-28 ENCOUNTER — Other Ambulatory Visit: Payer: Self-pay | Admitting: Physician Assistant

## 2021-06-28 ENCOUNTER — Other Ambulatory Visit: Payer: Self-pay

## 2021-06-28 ENCOUNTER — Ambulatory Visit (HOSPITAL_COMMUNITY)
Admission: RE | Admit: 2021-06-28 | Discharge: 2021-06-28 | Disposition: A | Discharge status: Home / Self Care | Payer: BC Managed Care – PPO | Source: Ambulatory Visit | Attending: Physician Assistant | Admitting: Physician Assistant

## 2021-06-28 DIAGNOSIS — J90 Pleural effusion, not elsewhere classified: Secondary | ICD-10-CM | POA: Diagnosis present

## 2021-06-28 MED ORDER — LIDOCAINE HCL 1 % IJ SOLN
INTRAMUSCULAR | Status: AC
  Filled 2021-06-28: qty 20

## 2021-06-28 NOTE — Progress Notes (Signed)
Pt presented for scheduled thoracentesis after effusion seen on cxr in office earlier in the week.  She has since started lasix.  On [place], there is no significant fluid collection amenable to drainage.  No procedure performed today.  Donyel Castagnola PA-C 06/28/2021 9:48 AM

## 2021-07-08 ENCOUNTER — Other Ambulatory Visit: Payer: Self-pay

## 2021-07-08 ENCOUNTER — Inpatient Hospital Stay: Payer: BC Managed Care – PPO | Attending: Oncology

## 2021-07-08 DIAGNOSIS — Z7982 Long term (current) use of aspirin: Secondary | ICD-10-CM | POA: Insufficient documentation

## 2021-07-08 DIAGNOSIS — Z79899 Other long term (current) drug therapy: Secondary | ICD-10-CM | POA: Diagnosis not present

## 2021-07-08 DIAGNOSIS — Z85038 Personal history of other malignant neoplasm of large intestine: Secondary | ICD-10-CM | POA: Diagnosis not present

## 2021-07-08 DIAGNOSIS — K769 Liver disease, unspecified: Secondary | ICD-10-CM | POA: Diagnosis not present

## 2021-07-08 DIAGNOSIS — Z87891 Personal history of nicotine dependence: Secondary | ICD-10-CM | POA: Diagnosis not present

## 2021-07-08 DIAGNOSIS — I480 Paroxysmal atrial fibrillation: Secondary | ICD-10-CM | POA: Insufficient documentation

## 2021-07-08 DIAGNOSIS — G4730 Sleep apnea, unspecified: Secondary | ICD-10-CM | POA: Insufficient documentation

## 2021-07-08 DIAGNOSIS — D751 Secondary polycythemia: Secondary | ICD-10-CM | POA: Insufficient documentation

## 2021-07-08 LAB — CBC WITH DIFFERENTIAL/PLATELET
Abs Immature Granulocytes: 0.01 10*3/uL (ref 0.00–0.07)
Basophils Absolute: 0 10*3/uL (ref 0.0–0.1)
Basophils Relative: 1 %
Eosinophils Absolute: 0.1 10*3/uL (ref 0.0–0.5)
Eosinophils Relative: 2 %
HCT: 45.1 % (ref 36.0–46.0)
Hemoglobin: 14.5 g/dL (ref 12.0–15.0)
Immature Granulocytes: 0 %
Lymphocytes Relative: 31 %
Lymphs Abs: 1.4 10*3/uL (ref 0.7–4.0)
MCH: 27.5 pg (ref 26.0–34.0)
MCHC: 32.2 g/dL (ref 30.0–36.0)
MCV: 85.6 fL (ref 80.0–100.0)
Monocytes Absolute: 0.4 10*3/uL (ref 0.1–1.0)
Monocytes Relative: 8 %
Neutro Abs: 2.6 10*3/uL (ref 1.7–7.7)
Neutrophils Relative %: 58 %
Platelets: 267 10*3/uL (ref 150–400)
RBC: 5.27 MIL/uL — ABNORMAL HIGH (ref 3.87–5.11)
RDW: 12.7 % (ref 11.5–15.5)
WBC: 4.5 10*3/uL (ref 4.0–10.5)
nRBC: 0 % (ref 0.0–0.2)

## 2021-07-08 LAB — COMPREHENSIVE METABOLIC PANEL
ALT: 30 U/L (ref 0–44)
AST: 25 U/L (ref 15–41)
Albumin: 4.2 g/dL (ref 3.5–5.0)
Alkaline Phosphatase: 65 U/L (ref 38–126)
Anion gap: 9 (ref 5–15)
BUN: 18 mg/dL (ref 8–23)
CO2: 25 mmol/L (ref 22–32)
Calcium: 9.2 mg/dL (ref 8.9–10.3)
Chloride: 103 mmol/L (ref 98–111)
Creatinine, Ser: 1.19 mg/dL — ABNORMAL HIGH (ref 0.44–1.00)
GFR, Estimated: 49 mL/min — ABNORMAL LOW (ref >60)
Glucose, Bld: 113 mg/dL — ABNORMAL HIGH (ref 70–99)
Potassium: 3.8 mmol/L (ref 3.5–5.1)
Sodium: 137 mmol/L (ref 135–145)
Total Bilirubin: 0.5 mg/dL (ref 0.3–1.2)
Total Protein: 7.4 g/dL (ref 6.5–8.1)

## 2021-07-09 ENCOUNTER — Other Ambulatory Visit: Payer: Self-pay

## 2021-07-09 ENCOUNTER — Ambulatory Visit (INDEPENDENT_AMBULATORY_CARE_PROVIDER_SITE_OTHER): Payer: BC Managed Care – PPO | Admitting: Internal Medicine

## 2021-07-09 ENCOUNTER — Encounter: Payer: Self-pay | Admitting: Oncology

## 2021-07-09 ENCOUNTER — Inpatient Hospital Stay (HOSPITAL_BASED_OUTPATIENT_CLINIC_OR_DEPARTMENT_OTHER): Payer: BC Managed Care – PPO | Admitting: Oncology

## 2021-07-09 VITALS — BP 137/75 | HR 89 | Temp 98.0°F | Resp 18 | Wt 177.0 lb

## 2021-07-09 DIAGNOSIS — Z85038 Personal history of other malignant neoplasm of large intestine: Secondary | ICD-10-CM

## 2021-07-09 DIAGNOSIS — E7800 Pure hypercholesterolemia, unspecified: Secondary | ICD-10-CM

## 2021-07-09 DIAGNOSIS — D751 Secondary polycythemia: Secondary | ICD-10-CM | POA: Diagnosis not present

## 2021-07-09 DIAGNOSIS — Z951 Presence of aortocoronary bypass graft: Secondary | ICD-10-CM

## 2021-07-09 DIAGNOSIS — D582 Other hemoglobinopathies: Secondary | ICD-10-CM

## 2021-07-09 DIAGNOSIS — G4730 Sleep apnea, unspecified: Secondary | ICD-10-CM

## 2021-07-09 DIAGNOSIS — I2510 Atherosclerotic heart disease of native coronary artery without angina pectoris: Secondary | ICD-10-CM

## 2021-07-09 DIAGNOSIS — R1900 Intra-abdominal and pelvic swelling, mass and lump, unspecified site: Secondary | ICD-10-CM

## 2021-07-09 DIAGNOSIS — I10 Essential (primary) hypertension: Secondary | ICD-10-CM | POA: Diagnosis not present

## 2021-07-09 DIAGNOSIS — R739 Hyperglycemia, unspecified: Secondary | ICD-10-CM

## 2021-07-09 DIAGNOSIS — K769 Liver disease, unspecified: Secondary | ICD-10-CM | POA: Diagnosis not present

## 2021-07-09 LAB — CEA: CEA: 3.4 ng/mL (ref 0.0–4.7)

## 2021-07-09 NOTE — Progress Notes (Signed)
Hematology/Oncology Progress note [phone] [phone]      Patient Care Team: [name], MD as PCP - General (Internal Medicine) [name], MD as PCP - Cardiology (Cardiology)  REFERRING PROVIDER: [name], MD  REASON FOR VISIT Follow up for treatment of erythrocytosis, liver lesion and adnexal lesion  HISTORY OF PRESENTING ILLNESS:  Susan Weeks is a  71 y.o.  female with PMH listed below who was referred to me for evaluation of elevated hemoglobin.  Recent hemoglobin 16.2, hct 47.7, reviewed her previous labs, hemoglobin has been chronically high since 2017, ranging from 15.1 to 16.2.   Pertinent Oncology Histroy  Previous history of carcinoma of colon status post resection, has an intermittent rising CEA.  Status post resection, no evidence of malignancy.-2011 Patient had exploratory laparotomy because of abnormal PET scan following rising CEA and was negative for any malignancy. CEA  remains stable in the range of 5.0   #Erythrocytosis was thought to be secondary, negative for Jak 2 mutation with reflex, BCR ABL mutation negative. # Sleep Apnea, patient establish care with Dr. Hector Shade.   on CPAP machine.  # 03/19/2020, ultrasound abdomen complete showed indeterminate lesions in the dome of liver.  Recommend MRI. 04/04/2020, patient had MRI abdomen with and without contrast Focal hepatic lesions [right dome2.3 x 1.9 cm, left lateral segment ] and in the right and left hepatic lobes, suspicious for metastatic disease. There is also a potential solid right adnexal lesion measuring 2.6 x 2.4 cm.  Potential ovarian neoplasm.  04/15/2020 US pelvis showed  Questionable visualization of a tiny RIGHT ovary containing 6 mm and 5 mm cysts; no follow-up imaging recommended. Questionable 3.4 cm diameter shadowing mass in RIGHT pelvis versus artifact related to stool within the colon; recommend dedicated CT imaging of the pelvis with IV  contrast to assess.  04/24/2020 PET scan showed the liver lesions are not hypermetabolic.    58/85/0277 Liver lesion was further evaluated by contrast enhanced [place] and Hypoechoic mass in the dome of the right lobe of the liver measuring up to 3.9 cm which demonstrates very early, flash filling diffuse enhancement without evidence of washout. These findings are most compatible with a flash filling hemangioma. Biopsy was not proceeded.    INTERVAL HISTORY Susan Weeks is a 71 y.o. female who has above history reviewed by me today presents for follow-up visit for management of erythrocytosis, history of colon cancer. She had CABG x 4 on 05/07/21, hospitalization was complicated by Atrial fibrilliation.  She reports feeling better today. No pain or shortness of breath.    Review of Systems  Constitutional:  Negative for chills, fever, malaise/fatigue and weight loss.  HENT:  Negative for sore throat.   Eyes:  Negative for redness.  Respiratory:  Negative for cough, shortness of breath and wheezing.   Cardiovascular:  Negative for chest pain, palpitations and leg swelling.  Gastrointestinal:  Negative for abdominal pain, blood in stool, nausea and vomiting.  Genitourinary:  Negative for dysuria.  Musculoskeletal:  Negative for myalgias.  Skin:  Negative for rash.  Neurological:  Negative for dizziness, tingling and tremors.  Endo/Heme/Allergies:  Does not bruise/bleed easily.  Psychiatric/Behavioral:  Negative for hallucinations.    MEDICAL HISTORY:  Past Medical History:  Diagnosis Date   Abnormal liver function    Colon cancer ([place])    adenocarcinoma of the cecum s/p partial colon resection   Coronary artery disease    a. 03/2021 Cor CTA: sev LAD dzs; b.  04/2021 Cath: lM nl, LAD 183m(unable to wire), D1 60, LCX nl, OM3 60, LPAV 100, RCA 90p; c. 05/2021 CABG x 4: LIMA->LAD, VG->RPDA, VG->OM1, VG->OM3.   History of colon polyps    Hyperlipidemia    Hypertension    Post-operative  Atrial Fibrillation ([place]    a. 05/2021 Brief PAF after CABG-->amio.   Sleep apnea 099/24/2683  Systolic murmur    a. 141/9622Echo: EF 60-65%, no rwma, GrI DD, RVSP 28.874mg. Mild-mod MR.    SURGICAL HISTORY: Past Surgical History:  Procedure Laterality Date   ABDOMINAL HYSTERECTOMY  1984   secondary to bleeding and fibroid tumors   APPENDECTOMY     COLONOSCOPY W/ POLYPECTOMY  2001   COLONOSCOPY WITH PROPOFOL N/A 07/28/2016   Procedure: COLONOSCOPY WITH PROPOFOL;  Surgeon: MaLollie SailsMD;  [facility];  Service: Endoscopy;  Laterality: N/A;   CORONARY ARTERY BYPASS GRAFT N/A 05/07/2021   Procedure: CORONARY ARTERY BYPASS GRAFTING (CABG) X 4, USING LEFT INTERNAL MAMMARY ARTERY, LEFT LEG GREATER SAPHENOUS VEIN HARVESTED ENDOSCOPICALLY;  Surgeon: LiLajuana MatteMD;  [facility]: [facility];  Laterality: N/A;   CORONARY STENT INTERVENTION N/A 04/15/2021   Procedure: CORONARY STENT INTERVENTION;  Surgeon: ArWellington HampshireMD;  [facility];  Service: Cardiovascular;  Laterality: N/A;   DILATION AND CURETTAGE OF UTERUS  1967   ENDOVEIN HARVEST OF GREATER SAPHENOUS VEIN Left 05/07/2021   Procedure: ENDOVEIN HARVEST OF GREATER SAPHENOUS VEIN;  Surgeon: LiLajuana MatteMD;  [place] [facility] [facility];  Laterality: Left;   IR THORACENTESIS ASP PLEURAL SPACE W/IMG GUIDE  05/10/2021   LEFT HEART CATH AND CORONARY ANGIOGRAPHY N/A 04/15/2021   Procedure: LEFT HEART CATH AND CORONARY ANGIOGRAPHY;  Surgeon: ArWellington HampshireMD;  [facility];  Service: Cardiovascular;  Laterality: N/A;   TEE WITHOUT CARDIOVERSION N/A 05/07/2021   Procedure: TRANSESOPHAGEAL ECHOCARDIOGRAM (TEE);  Surgeon: LiLajuana MatteMD;  [place] [facility]: [facility];  Laterality: N/A;    SOCIAL HISTORY: Social History   Socioeconomic History   Marital status: Married    Spouse name: Eddie   Number of  children: 1   Years of education: HS   Highest education level: Not on file  Occupational History    Employer: OTHER    Comment: [employer]  Tobacco Use   Smoking status: Former    Types: Cigarettes    Quit date: 1978    Years since quitting: 45.2   Smokeless tobacco: Never  Vaping Use   Vaping Use: Never used  Substance and Sexual Activity   Alcohol use: No    Alcohol/week: 0.0 standard drinks   Drug use: No   Sexual activity: Not on file  Other Topics Concern   Not on file  Social History Narrative   Not on file   Social Determinants of Health   Financial Resource Strain: Not on file  Food Insecurity: Not on file  Transportation Needs: Not on file  Physical Activity: Not on file  Stress: Not on file  Social Connections: Not on file  Intimate Partner Violence: Not on file    FAMILY HISTORY: Family History  Problem Relation Age of Onset   Stroke Mother    Hypertension Mother    Cancer Father        Prostate    Hypertension Sister        x4   Heart  attack Brother    Heart disease Brother        myocardial infarction   Hypertension Brother    Breast cancer Neg Hx     ALLERGIES:  is allergic to tape, lisinopril, and z-pak [azithromycin].  MEDICATIONS:  Current Outpatient Medications  Medication Sig Dispense Refill   acetaminophen (TYLENOL) 500 MG tablet Take 500 mg by mouth every 6 (six) hours as needed for moderate pain.     amLODipine (NORVASC) 5 MG tablet TAKE 1 TABLET BY MOUTH EVERY DAY 90 tablet 1   aspirin EC 325 MG EC tablet Take 1 tablet (325 mg total) by mouth daily.     Calcium Carbonate-Vitamin D (CALCIUM 600+D PO) Take 1 tablet by mouth daily.     metoprolol tartrate (LOPRESSOR) 25 MG tablet Take 1 tablet (25 mg total) by mouth 2 (two) times daily. 60 tablet 11   NON FORMULARY Pt uses a cpap nightly     potassium chloride SA (KLOR-CON M) 20 MEQ tablet Take 1 tablet (20 mEq total) by mouth daily. X 7 days, then only on days you take Lasix 30  tablet 1   rosuvastatin (CRESTOR) 10 MG tablet Take 1 tablet (10 mg total) by mouth daily. 90 tablet 3   vitamin E 400 UNIT capsule Take 400 Units by mouth daily.      furosemide (LASIX) 40 MG tablet Take 1 tablet (40 mg total) by mouth daily. X 7 days, then take daily as needed for weight gain of 3 lbs in 24 hrs or 5 lbs in 48 (Patient not taking: Reported on 07/09/2021) 30 tablet 1   No current facility-administered medications for this visit.     PHYSICAL EXAMINATION: ECOG PERFORMANCE STATUS: 0 - Asymptomatic Vitals:   07/09/21 1348  BP: 137/75  Pulse: 89  Resp: 18  Temp: 98 F (36.7 C)  SpO2: 97%   Filed Weights   07/09/21 1348  Weight: 177 lb (80.3 kg)    Physical Exam Constitutional:      General: She is not in acute distress. HENT:     Head: Normocephalic and atraumatic.  Eyes:     General: No scleral icterus. Cardiovascular:     Rate and Rhythm: Normal rate and regular rhythm.     Heart sounds: Normal heart sounds.  Pulmonary:     Effort: Pulmonary effort is normal. No respiratory distress.     Breath sounds: No wheezing.  Abdominal:     General: Bowel sounds are normal. There is no distension.     Palpations: Abdomen is soft.  Musculoskeletal:        General: No deformity. Normal range of motion.     Cervical back: Normal range of motion and neck supple.  Skin:    General: Skin is warm and dry.     Findings: No erythema or rash.  Neurological:     Mental Status: She is alert and oriented to person, place, and time. Mental status is at baseline.     Cranial Nerves: No cranial nerve deficit.     Coordination: Coordination normal.  Psychiatric:        Mood and Affect: Mood normal.     LABORATORY DATA:  I have reviewed the data as listed Lab Results  Component Value Date   WBC 4.5 07/08/2021   HGB 14.5 07/08/2021   HCT 45.1 07/08/2021   MCV 85.6 07/08/2021   PLT 267 07/08/2021   Recent Labs    07/25/20 0903 11/07/20 0957 11/30/20 1145  03/19/21 1428 05/03/21 1032 05/07/21 0757 05/10/21 0354 05/11/21 0122 07/08/21 1126  NA 137   < > 139   < > 136   < > 136 139 137  K 4.2   < > 3.9   < > 3.7   < > 4.1 3.9 3.8  CL 101   < > 103   < > 103   < > 101 107 103  CO2 27   < > 29   < > 21*   < > '27 26 25  '$ GLUCOSE 96   < > 108*   < > 99   < > 129* 135* 113*  BUN 16   < > 13   < > 12   < > '10 13 18  '$ CREATININE 0.87   < > 0.82   < > 0.80   < > 0.78 0.97 1.19*  CALCIUM 8.8   < > 9.2   < > 9.3   < > 8.1* 8.1* 9.2  GFRNONAA  --    < > >60  --  >60   < > >60 >60 49*  PROT 6.9  --  7.1  --  7.4  --   --   --  7.4  ALBUMIN 4.5  --  4.3  --  4.3  --   --   --  4.2  AST 20  --  28  --  30  --   --   --  25  ALT 31  --  34  --  37  --   --   --  30  ALKPHOS 63  --  59  --  62  --   --   --  65  BILITOT 0.7  --  0.8  --  1.2  --   --   --  0.5  BILIDIR 0.2  --  0.1  --   --   --   --   --   --   IBILI  --   --  0.7  --   --   --   --   --   --    < > = values in this interval not displayed.        ASSESSMENT & PLAN:  1. Liver lesion   2. Erythrocytosis   3. History of colon cancer    # liver lesions she has had extensive work up No hypermetabolism on PET, Enhanced US showed likely benign hemangioma and biopsy was not proceeded.  Repeat enhanced [place] for follow up with 2 liver lesions.   # Right adnexal lesion, has had negative work up so far. Negative on PET  #Secondary erythrocytosis, labs are reviewed.  Hemoglobin was low during recent hospitalization.  Hemoglobin has normalized to 14.5  Continue CPAP machine. No need for phlebotomuy   Orders Placed This Encounter  Procedures   US LIVER W/CM EA ADDT'L LESION    Standing Status:   Future    Standing Expiration Date:   07/10/2022    Order Specific Question:   Reason for Exam (SYMPTOM  OR DIAGNOSIS REQUIRED)    Answer:   liver mass    Order Specific Question:   Preferred imaging location?    Answer:   [facility]   US LIVER W/CM 1ST LESION    Standing Status:    Future    Standing Expiration Date:   07/10/2022    Order Specific Question:   Reason for Exam (  SYMPTOM  OR DIAGNOSIS REQUIRED)    Answer:   liver mass    Order Specific Question:   Preferred imaging location?    Answer:   [facility]   CBC with Differential/Platelet    Standing Status:   Future    Standing Expiration Date:   07/10/2022   Comprehensive metabolic panel    Standing Status:   Future    Standing Expiration Date:   07/09/2022   CEA    Standing Status:   Future    Standing Expiration Date:   07/09/2022   Follow up Labs in 6 months (cbc,cmp,cea)  MD 1-2 days after labs  Earlie Server, MD, PhD Hematology Oncology  07/09/2021

## 2021-07-09 NOTE — Progress Notes (Unsigned)
[name]: Susan Weeks, female   DOB: 01-19-1951, 71 y.o.   MRN: [id number]   Subjective:    [name]: Susan Weeks, female    DOB: [birth year], 71 y.o.   MRN: [id number]  This visit occurred during the SARS-CoV-2 public health emergency.  Safety protocols were in place, including screening questions prior to the visit, additional usage of staff PPE, and extensive cleaning of exam room while observing appropriate contact time as indicated for disinfecting solutions.   Patient here for  Chief Complaint  Patient presents with   Hypertension   Hyperlipidemia   Follow-up    S/p CABG x 4   .   HPI    Past Medical History:  Diagnosis Date   Abnormal liver function    Colon cancer ([place])    adenocarcinoma of the cecum s/p partial colon resection   Coronary artery disease    a. 03/2021 Cor CTA: sev LAD dzs; b. 04/2021 Cath: lM nl, LAD 138m(unable to wire), D1 60, LCX nl, OM3 60, LPAV 100, RCA 90p; c. 05/2021 CABG x 4: LIMA->LAD, VG->RPDA, VG->OM1, VG->OM3.   History of colon polyps    Hyperlipidemia    Hypertension    [place] F[place] ([place]    a. 05/2021 Brief PAF after CABG-->amio.   Sleep apnea 085/27/7824  Systolic murmur    a. 123/5361Echo: EF 60-65%, no rwma, GrI DD, RVSP 28.842mg. Mild-mod MR.   Past Surgical History:  Procedure Laterality Date   ABDOMINAL HYSTERECTOMY  1984   secondary to bleeding and fibroid tumors   APPENDECTOMY     COLONOSCOPY W/ POLYPECTOMY  2001   COLONOSCOPY WITH PROPOFOL N/A 07/28/2016   Procedure: COLONOSCOPY WITH PROPOFOL;  Surgeon: MaLollie SailsMD;  [facility];  Service: Endoscopy;  Laterality: N/A;   CORONARY ARTERY BYPASS GRAFT N/A 05/07/2021   Procedure: CORONARY ARTERY BYPASS GRAFTING (CABG) X 4, USING LEFT INTERNAL MAMMARY ARTERY, LEFT LEG GREATER SAPHENOUS VEIN HARVESTED ENDOSCOPICALLY;  Surgeon: LiLajuana MatteMD;  [place]: [facility];  Laterality: N/A;   CORONARY  STENT INTERVENTION N/A 04/15/2021   Procedure: CORONARY STENT INTERVENTION;  Surgeon: ArWellington HampshireMD;  [place];  Service: Cardiovascular;  Laterality: N/A;   DILATION AND CURETTAGE OF UTERUS  1967   ENDOVEIN HARVEST OF GREATER SAPHENOUS VEIN Left 05/07/2021   Procedure: ENDOVEIN HARVEST OF GREATER SAPHENOUS VEIN;  Surgeon: LiLajuana MatteMD;  [facility]: Open [facility] Surgery;  Laterality: Left;   IR THORACENTESIS ASP PLEURAL SPACE W/IMG GUIDE  05/10/2021   LEFT HEART CATH AND CORONARY ANGIOGRAPHY N/A 04/15/2021   Procedure: LEFT HEART CATH AND CORONARY ANGIOGRAPHY;  Surgeon: ArWellington HampshireMD;  [facility];  Service: Cardiovascular;  Laterality: N/A;   TEE WITHOUT CARDIOVERSION N/A 05/07/2021   Procedure: TRANSESOPHAGEAL ECHOCARDIOGRAM (TEE);  Surgeon: LiLajuana MatteMD;  [facility]: [facility] Surgery;  Laterality: N/A;   Family History  Problem Relation Age of Onset   Stroke Mother    Hypertension Mother    Cancer Father        Prostate    Hypertension Sister        x4   Heart attack Brother    Heart disease Brother        myocardial infarction   Hypertension Brother    Breast cancer Neg Hx    Social History   Socioeconomic  History   Marital status: Married    Spouse name: Eddie   Number of children: 1   Years of education: HS   Highest education level: Not on file  Occupational History    Employer: OTHER    Comment: [employer]  Tobacco Use   Smoking status: Former    Types: Cigarettes    Quit date: 1978    Years since quitting: 45.2   Smokeless tobacco: Never  Vaping Use   Vaping Use: Never used  Substance and Sexual Activity   Alcohol use: No    Alcohol/week: 0.0 standard drinks   Drug use: No   Sexual activity: Not on file  Other Topics Concern   Not on file  Social History Narrative   Not on file   Social Determinants of Health   Financial Resource Strain: Not  on file  Food Insecurity: Not on file  Transportation Needs: Not on file  Physical Activity: Not on file  Stress: Not on file  Social Connections: Not on file     Review of Systems     Objective:     BP 140/78    Pulse 90    Temp 97.6 F (36.4 C)    Resp 16    Ht '5\' 3"'$  (1.6 m)    Wt 177 lb (80.3 kg)    LMP 06/28/1982    SpO2 98%    BMI 31.35 kg/m  Wt Readings from Last 3 Encounters:  07/09/21 177 lb (80.3 kg)  07/09/21 177 lb (80.3 kg)  06/26/21 176 lb (79.8 kg)    Physical Exam   Outpatient Encounter Medications as of 07/09/2021  Medication Sig   acetaminophen (TYLENOL) 500 MG tablet Take 500 mg by mouth every 6 (six) hours as needed for moderate pain.   amLODipine (NORVASC) 5 MG tablet TAKE 1 TABLET BY MOUTH EVERY DAY   aspirin EC 325 MG EC tablet Take 1 tablet (325 mg total) by mouth daily.   Calcium Carbonate-Vitamin D (CALCIUM 600+D PO) Take 1 tablet by mouth daily.   furosemide (LASIX) 40 MG tablet Take 1 tablet (40 mg total) by mouth daily. X 7 days, then take daily as needed for weight gain of 3 lbs in 24 hrs or 5 lbs in 48 (Patient not taking: Reported on 07/09/2021)   metoprolol tartrate (LOPRESSOR) 25 MG tablet Take 1 tablet (25 mg total) by mouth 2 (two) times daily.   NON FORMULARY Pt uses a cpap nightly   rosuvastatin (CRESTOR) 10 MG tablet Take 1 tablet (10 mg total) by mouth daily.   vitamin E 400 UNIT capsule Take 400 Units by mouth daily.    [DISCONTINUED] lisinopril (ZESTRIL) 30 MG tablet TAKE 1 TABLET BY MOUTH EVERY DAY   [DISCONTINUED] potassium chloride SA (KLOR-CON M) 20 MEQ tablet Take 1 tablet (20 mEq total) by mouth daily. X 7 days, then only on days you take Lasix (Patient not taking: Reported on 07/09/2021)   No facility-administered encounter medications on file as of 07/09/2021.     Lab Results  Component Value Date   WBC 4.5 07/08/2021   HGB 14.5 07/08/2021   HCT 45.1 07/08/2021   PLT 267 07/08/2021   GLUCOSE 113 (H) 07/08/2021   CHOL 147  11/30/2020   TRIG 79 11/30/2020   HDL 61 11/30/2020   LDLDIRECT 150.8 03/02/2013   LDLCALC 70 11/30/2020   ALT 30 07/08/2021   AST 25 07/08/2021   NA 137 07/08/2021   K 3.8 07/08/2021  CL 103 07/08/2021   CREATININE 1.19 (H) 07/08/2021   BUN 18 07/08/2021   CO2 25 07/08/2021   TSH 1.127 11/30/2020   INR 1.6 (H) 05/07/2021   HGBA1C 5.8 (H) 05/03/2021    IR US CHEST  Result Date: 06/28/2021 CLINICAL DATA:  Left pleural effusion. EXAM: CHEST ULTRASOUND COMPARISON:  Chest x-ray on 06/26/2021 FINDINGS: The left chest was examined by ultrasound prior to possible thoracentesis. There is only a minimal amount of left pleural fluid identified likely representing significant regression since the recent chest x-ray 2 days ago. There is not enough fluid for thoracentesis. IMPRESSION: Only a minimal amount of left pleural fluid is identified currently by ultrasound. Thoracentesis was not performed. Electronically Signed   By: Aletta Edouard M.D.   On: 06/28/2021 09:49       Assessment & Plan:   Problem List Items Addressed This Visit   None    [name], MD

## 2021-07-11 ENCOUNTER — Ambulatory Visit: Payer: BC Managed Care – PPO | Admitting: Internal Medicine

## 2021-07-16 ENCOUNTER — Encounter: Payer: Self-pay | Admitting: Internal Medicine

## 2021-07-16 NOTE — Assessment & Plan Note (Signed)
Noted on pelvic ultrasound.  Seeing oncology.  Had PET.  No mention of pelvic abnormality.  Continue f/u with oncology.  ?

## 2021-07-16 NOTE — Assessment & Plan Note (Signed)
Continue CPAP.  

## 2021-07-16 NOTE — Assessment & Plan Note (Signed)
Colonoscopy 10/2019 - recommended f/u in 3 years.  

## 2021-07-16 NOTE — Assessment & Plan Note (Signed)
Followed by hematology as outlined.  Continue cpap.  

## 2021-07-16 NOTE — Assessment & Plan Note (Signed)
Has done well s/p surgery.  In SR today on exam. No chest pain or sob reported.  Has f/u planned with Dr [name] and Dr Kipp Brood.  ?

## 2021-07-16 NOTE — Assessment & Plan Note (Signed)
Had swelling on lisinopril.  On amlodipine. Blood pressure as outlined..  Follow pressures.  Follow metabolic panel.  

## 2021-07-16 NOTE — Assessment & Plan Note (Signed)
Low carb diet and exercise.  Follow met b and a1c.  ?

## 2021-07-16 NOTE — Assessment & Plan Note (Signed)
Continues on crestor.  Low cholesterol diet and exercise.  Follow lipid panel and liver function tests.   ?Lab Results  ?Component Value Date  ? CHOL 147 11/30/2020  ? HDL 61 11/30/2020  ? Norman 70 11/30/2020  ? LDLDIRECT 150.8 03/02/2013  ? TRIG 79 11/30/2020  ? CHOLHDL 2.4 11/30/2020  ? ?

## 2021-07-17 ENCOUNTER — Other Ambulatory Visit: Payer: Self-pay

## 2021-07-17 ENCOUNTER — Encounter: Payer: BC Managed Care – PPO | Attending: Cardiovascular Disease | Admitting: *Deleted

## 2021-07-17 ENCOUNTER — Other Ambulatory Visit: Payer: Self-pay | Admitting: Thoracic Surgery (Cardiothoracic Vascular Surgery)

## 2021-07-17 DIAGNOSIS — Z951 Presence of aortocoronary bypass graft: Secondary | ICD-10-CM

## 2021-07-17 DIAGNOSIS — Z48812 Encounter for surgical aftercare following surgery on the circulatory system: Secondary | ICD-10-CM | POA: Insufficient documentation

## 2021-07-17 NOTE — Progress Notes (Signed)
Initial telephone orientation completed. Diagnosis can be found in CHL 1/3. EP orientation scheduled for Tuesday 3/21 at 8am.  ?

## 2021-07-18 ENCOUNTER — Ambulatory Visit (INDEPENDENT_AMBULATORY_CARE_PROVIDER_SITE_OTHER): Payer: BC Managed Care – PPO | Admitting: Nurse Practitioner

## 2021-07-18 ENCOUNTER — Other Ambulatory Visit
Admission: RE | Admit: 2021-07-18 | Discharge: 2021-07-18 | Disposition: A | Discharge status: Home / Self Care | Payer: BC Managed Care – PPO | Attending: Nurse Practitioner | Admitting: Nurse Practitioner

## 2021-07-18 ENCOUNTER — Encounter: Payer: Self-pay | Admitting: Nurse Practitioner

## 2021-07-18 ENCOUNTER — Other Ambulatory Visit: Payer: Self-pay | Admitting: Physician Assistant

## 2021-07-18 ENCOUNTER — Other Ambulatory Visit: Payer: Self-pay | Admitting: *Deleted

## 2021-07-18 VITALS — BP 120/80 | HR 79 | Ht 63.0 in | Wt 177.1 lb

## 2021-07-18 DIAGNOSIS — E785 Hyperlipidemia, unspecified: Secondary | ICD-10-CM | POA: Insufficient documentation

## 2021-07-18 DIAGNOSIS — I2510 Atherosclerotic heart disease of native coronary artery without angina pectoris: Secondary | ICD-10-CM | POA: Diagnosis present

## 2021-07-18 DIAGNOSIS — I10 Essential (primary) hypertension: Secondary | ICD-10-CM | POA: Diagnosis not present

## 2021-07-18 DIAGNOSIS — I480 Paroxysmal atrial fibrillation: Secondary | ICD-10-CM | POA: Diagnosis not present

## 2021-07-18 LAB — COMPREHENSIVE METABOLIC PANEL
ALT: 63 U/L — ABNORMAL HIGH (ref 0–44)
AST: 70 U/L — ABNORMAL HIGH (ref 15–41)
Albumin: 4.3 g/dL (ref 3.5–5.0)
Alkaline Phosphatase: 68 U/L (ref 38–126)
Anion gap: 7 (ref 5–15)
BUN: 21 mg/dL (ref 8–23)
CO2: 28 mmol/L (ref 22–32)
Calcium: 9.4 mg/dL (ref 8.9–10.3)
Chloride: 105 mmol/L (ref 98–111)
Creatinine, Ser: 1.11 mg/dL — ABNORMAL HIGH (ref 0.44–1.00)
GFR, Estimated: 53 mL/min — ABNORMAL LOW (ref >60)
Glucose, Bld: 97 mg/dL (ref 70–99)
Potassium: 4.2 mmol/L (ref 3.5–5.1)
Sodium: 140 mmol/L (ref 135–145)
Total Bilirubin: 0.7 mg/dL (ref 0.3–1.2)
Total Protein: 7.9 g/dL (ref 6.5–8.1)

## 2021-07-18 LAB — LDL CHOLESTEROL, DIRECT: Direct LDL: 53 mg/dL (ref 0–99)

## 2021-07-18 NOTE — Progress Notes (Signed)
? ? ?Office Visit  ?  ?Patient Name: Susan Weeks ?Date of Encounter: 07/18/2021 ? ?Primary Care Provider:  [name], MD ?Primary Cardiologist:  [name], MD ? ?Chief Complaint  ?  ?71 year old female with a history of CAD, hyperlipidemia, hypertension, erythrocytosis, sleep apnea, and colon cancer, who presents for follow-up after recent CABG x4. ? ?Past Medical History  ?  ?Past Medical History:  ?Diagnosis Date  ? Abnormal liver function   ? Colon cancer ([place])   ? adenocarcinoma of the cecum s/p partial colon resection  ? Coronary artery disease   ? a. 03/2021 Cor CTA: sev LAD dzs; b. 04/2021 Cath: lM nl, LAD 193m(unable to wire), D1 60, LCX nl, OM3 60, LPAV 100, RCA 90p; c. 05/2021 CABG x 4: LIMA->LAD, VG->RPDA, VG->OM1, VG->OM3.  ? History of colon polyps   ? Hyperlipidemia   ? Hypertension   ? [place]   ? a. 05/2021 Brief PAF after CABG-->amio.  ? Sleep apnea 12/28/2017  ? Systolic murmur   ? a. 171/6967Echo: EF 60-65%, no rwma, GrI DD, RVSP 28.875mg. Mild-mod MR.  ? ?Past Surgical History:  ?Procedure Laterality Date  ? ABDOMINAL HYSTERECTOMY  1984  ? secondary to bleeding and fibroid tumors  ? APPENDECTOMY    ? COLONOSCOPY W/ POLYPECTOMY  2001  ? COLONOSCOPY WITH PROPOFOL N/A 07/28/2016  ? Procedure: COLONOSCOPY WITH PROPOFOL;  Surgeon: MaLollie SailsMD;  [facility];  Service: Endoscopy;  Laterality: N/A;  ? CORONARY ARTERY BYPASS GRAFT N/A 05/07/2021  ? Procedure: CORONARY ARTERY BYPASS GRAFTING (CABG) X 4, USING LEFT INTERNAL MAMMARY ARTERY, LEFT LEG GREATER SAPHENOUS VEIN HARVESTED ENDOSCOPICALLY;  Surgeon: LiLajuana MatteMD;  [place] [facility] Service: [place] [facility];  Laterality: N/A;  ? CORONARY STENT INTERVENTION N/A 04/15/2021  ? Procedure: CORONARY STENT INTERVENTION;  Surgeon: ArWellington HampshireMD;  [facility];  Service: Cardiovascular;  Laterality: N/A;  ? [place]?  ENDOVEIN HARVEST OF GREATER SAPHENOUS VEIN Left 05/07/2021  ? Procedure: ENDOVEIN HARVEST OF GREATER SAPHENOUS VEIN;  Surgeon: LiLajuana MatteMD;  [place] [facility] Service: Open Heart Surgery;  Laterality: Left;  ? IR THORACENTESIS ASP PLEURAL SPACE W/IMG GUIDE  05/10/2021  ? LEFT HEART CATH AND CORONARY ANGIOGRAPHY N/A 04/15/2021  ? Procedure: LEFT HEART CATH AND CORONARY ANGIOGRAPHY;  Surgeon: ArWellington HampshireMD;  [facility];  Service: Cardiovascular;  Laterality: N/A;  ? TEE WITHOUT CARDIOVERSION N/A 05/07/2021  ? Procedure: TRANSESOPHAGEAL ECHOCARDIOGRAM (TEE);  Surgeon: LiLajuana MatteMD;  Location: [facility] Service: Open Heart Surgery;  Laterality: N/A;  ? ? ?Allergies ? ?Allergies  ?Allergen Reactions  ? Tape   ?  Bruising from paper tape  ? Lisinopril Rash  ?  Developed slight facial swelling and itching over forehead while taking. Unclear if definite ACE allergy, but concern enough to stop in ER today.  ? Z-Pak [Azithromycin] Nausea And Vomiting  ?  Caused Emesis  ? ? ?History of Present Illness  ?  ?[age]ear old female with a history of CAD, hyperlipidemia, hypertension, erythrocytosis, sleep apnea, and colon cancer. She was first evaluated for exertional dyspnea, increasing leg edema, and an episode of chest fullness by Dr. ArFletcher Anonn November 2022. Coronary CT angiography showed severe LAD disease. Echocardiogram showed normal LV function with grade 1 diastolic dysfunction and mild to moderate mitral regurgitation. Diagnostic catheretization in December 2022 showed  occluded LAD and severe RCA disease.  Initially, there was an effort to perform PCI in the LAD with plan for staged PCI of the RCA however, the interventional team was unable to cross the LAD with a wire and decision was made to refer for cardiothoracic surgical evaluation.  Patient subsequently underwent CABG x4 on May 07, 2021.   Notes indicate that she had a brief episode of atrial fibrillation shortly after  surgery, and this has been managed with oral amiodarone.  After an otherwise relatively uneventful postoperative course, she was discharged January 9. ? ?Ms. Maris was last seen in cardiology clinic on 2/7, @ which time she was doing well.  In the setting of an LDL of 70, her rosuvastatin was titrated.  She saw CT surgery on 2/22 and CXR suggested a large L pleural effusion.  She was placed on oral lasix x 7 days and then advised to use PRN for swelling. She was also scheduled for U/S guided thoracentesis, however, U/S showed only trace L pleural fluid, and the procedure was not performed.  Today, she reports feeling well w/o angina or dyspnea.  She has minimal chest wall soreness.  She does note occasional fatigue and continues to have trace lower extremity swelling and some tenderness, impacting the L leg more so than the R.  She does elevate her legs and uses compression socks.  Her wt has been stable Continues to weigh herself daily and has not needed to take any PRN lasix.  She denies palpitations, dyspnea, pnd, orthopnea, n, v, dizziness, syncope, weight gain, or early satiety.  She plans to start cardiac rehab next week. ? ?Home Medications  ?  ?Current Outpatient Medications  ?Medication Sig Dispense Refill  ? acetaminophen (TYLENOL) 500 MG tablet Take 500 mg by mouth every 6 (six) hours as needed for moderate pain.    ? amLODipine (NORVASC) 5 MG tablet TAKE 1 TABLET BY MOUTH EVERY DAY 90 tablet 1  ? aspirin EC 325 MG EC tablet Take 1 tablet (325 mg total) by mouth daily.    ? Calcium Carbonate-Vitamin D (CALCIUM 600+D PO) Take 1 tablet by mouth daily.    ? metoprolol tartrate (LOPRESSOR) 25 MG tablet Take 1 tablet (25 mg total) by mouth 2 (two) times daily. 60 tablet 11  ? NON FORMULARY Pt uses a cpap nightly    ? rosuvastatin (CRESTOR) 10 MG tablet Take 1 tablet (10 mg total) by mouth daily. 90 tablet 3  ? vitamin E 400 UNIT capsule Take 400 Units by mouth daily.     ? ?No current facility-administered  medications for this visit.  ?  ? ?Review of Systems  ?  ?Mild lower extremity swelling and tenderness, left greater than right, which has improved. Some  fatigue with exertion with speed walking. Reports no chest pain, dyspnea, palpitations, PND, orthopnea, dizziness, syncope, or early satiety. All other systems reviewed and are otherwise negative except as noted above. ?  ? ?Physical Exam  ?  ?VS:  BP 120/80 (BP Location: Left Arm, Patient Position: Sitting, Cuff Size: Normal)   Pulse 79   Ht '5\' 3"'$  (1.6 m)   Wt 177 lb 2 oz (80.3 kg)   LMP 06/28/1982   SpO2 96%   BMI 31.38 kg/m?  , BMI Body mass index is 31.38 kg/m?. ?    ?GEN: Well nourished, well developed, in no acute distress. ?HEENT: normal. ?Neck: Supple, no JVD, carotid bruits, or masses. ?Cardiac: RRR, 1/6 systolic murmur at the upper  sternal borders, no murmurs, rubs, or gallops. No clubbing, cyanosis, trace bilateral lower extremity edema, left greater than right.  Radials/PT 2+ and equal bilaterally. Sternal incision approximated, no drainage or surrounding skin erythema. Bilateral lower leg incisions have healed well and formed circular hyperpigmented scars, no drainage or surrounding skin erythema.  ?Respiratory:  Respirations regular and unlabored, clear to auscultation bilaterally. ?GI: Soft, nontender, nondistended, BS + x 4. ?MS: no deformity or atrophy. ?Skin: warm and dry, no rash. ?Neuro:  Strength and sensation are intact. ?Psych: Normal affect. ? ?Accessory Clinical Findings  ?  ?ECG personally reviewed by me today - regular sinus rhythm, 79, LAE - no acute changes. ? ?Lab Results  ?Component Value Date  ? WBC 4.5 07/08/2021  ? HGB 14.5 07/08/2021  ? HCT 45.1 07/08/2021  ? MCV 85.6 07/08/2021  ? PLT 267 07/08/2021  ? ?Lab Results  ?Component Value Date  ? CREATININE 1.11 (H) 07/18/2021  ? BUN 21 07/18/2021  ? NA 140 07/18/2021  ? K 4.2 07/18/2021  ? CL 105 07/18/2021  ? CO2 28 07/18/2021  ? ?Lab Results  ?Component Value Date  ? ALT 63  (H) 07/18/2021  ? AST 70 (H) 07/18/2021  ? ALKPHOS 68 07/18/2021  ? BILITOT 0.7 07/18/2021  ? ?Lab Results  ?Component Value Date  ? CHOL 147 11/30/2020  ? HDL 61 11/30/2020  ? [place] 70 11/30/2020  ? LDLDIRECT 53.

## 2021-07-18 NOTE — Patient Instructions (Signed)
Medication Instructions:  ?No changes at this time. ? ?*If you need a refill on your cardiac medications before your next appointment, please call your pharmacy* ? ? ?Lab Work: ?CMET & DLDL [facility] Stop at registration and they will direct you from there.  ? ?If you have labs (blood work) drawn today and your tests are completely normal, you will receive your results only by: ?MyChart Message (if you have MyChart) OR ?A paper copy in the mail ?If you have any lab test that is abnormal or we need to change your treatment, we will call you to review the results. ? ? ?Testing/Procedures: ?None ? ? ?Follow-Up: ?At [facility], you and your health needs are our priority.  As part of our continuing mission to provide you with exceptional heart care, we have created designated Provider Care Teams.  These Care Teams include your primary Cardiologist (physician) and Advanced Practice Providers (APPs -  Physician Assistants and Nurse Practitioners) who all work together to provide you with the care you need, when you need it. ? ? ?Your next appointment:   ?3 month(s) ? ?The format for your next appointment:   ?In Person ? ?Provider:   ?[name], MD or Murray Hodgkins, NP ?

## 2021-07-19 ENCOUNTER — Ambulatory Visit (INDEPENDENT_AMBULATORY_CARE_PROVIDER_SITE_OTHER): Payer: Self-pay | Admitting: Thoracic Surgery (Cardiothoracic Vascular Surgery)

## 2021-07-19 ENCOUNTER — Other Ambulatory Visit: Payer: Self-pay

## 2021-07-19 ENCOUNTER — Ambulatory Visit
Admission: RE | Admit: 2021-07-19 | Discharge: 2021-07-19 | Disposition: A | Discharge status: Home / Self Care | Payer: BC Managed Care – PPO | Source: Ambulatory Visit | Attending: Thoracic Surgery (Cardiothoracic Vascular Surgery) | Admitting: Thoracic Surgery (Cardiothoracic Vascular Surgery)

## 2021-07-19 ENCOUNTER — Telehealth: Payer: Self-pay

## 2021-07-19 ENCOUNTER — Telehealth: Payer: Self-pay | Admitting: Pulmonary Disease

## 2021-07-19 VITALS — BP 150/84 | HR 84 | Resp 20 | Ht 63.0 in | Wt 176.0 lb

## 2021-07-19 DIAGNOSIS — J90 Pleural effusion, not elsewhere classified: Secondary | ICD-10-CM | POA: Insufficient documentation

## 2021-07-19 DIAGNOSIS — Z951 Presence of aortocoronary bypass graft: Secondary | ICD-10-CM

## 2021-07-19 DIAGNOSIS — I2510 Atherosclerotic heart disease of native coronary artery without angina pectoris: Secondary | ICD-10-CM

## 2021-07-19 NOTE — Telephone Encounter (Signed)
-----   Message from Synthia Innocent sent at 07/19/2021 11:37 AM EDT ----- ?Regarding: Thoracentesis ?Good morning! ? ?Dr Kipp Brood has placed referral to either of you for this patient to have a thoracentesis asap. Can either of you assist with this? ? ?Thanks so much! ? ? ?

## 2021-07-19 NOTE — Telephone Encounter (Signed)
L MOM to schedule lab appt for next week. (LFT)

## 2021-07-19 NOTE — Progress Notes (Signed)
? ?   ?  [address] ?      [place] [address] ?            [phone]       ? ?Susan Weeks ?[place] Record #[id number] ?Date of Birth: 11-May-1950 ? ?Referring: [name], MD ?Primary Care: [name], MD ?Primary Cardiologist:Muhammad [name], MD ? ?Reason for visit:   follow-up ? ?History of Present Illness:     ?Susan Weeks presents in follow-up.  She underwent coronary artery bypass grafting in January of this year.  She represented with a pleural effusion.  Unfortunately thoracentesis was unsuccessful.  She continues to have some shortness of breath with exertion. ? ?Physical Exam: ?BP (!) 150/84   Pulse 84   Resp 20   Ht '5\' 3"'$  (1.6 m)   Wt 176 lb (79.8 kg)   LMP 06/28/1982   SpO2 97% Comment: RA  BMI 31.18 kg/m?  ? ?Alert NAD ?Incision clean.  Sternum stable ?Abdomen, ND ?No peripheral edema ? ? ?Diagnostic Studies & Laboratory data: ?CXR: Unchanged left pleural effusion ? ?  ? ?Assessment / Plan:   ?71 year old female status post CABG with postoperative left effusion.  I have referred her to pulmonary medicine for thoracentesis.  She is cleared to start cardiac rehab. ? ? ?Lucile Crater [place] ?07/19/2021 12:40 PM ? ? ? ? ? ? ?

## 2021-07-19 NOTE — Telephone Encounter (Signed)
PCCM: ? ?Orders placed for thoracentesis next week. ?Can be done by myself or Dr. Tamala Julian hopefully Monday or Tuesday afternoon in [facility] minor procedure room. ? ?Garner Nash, DO ?[facility] ?07/19/2021 4:14 PM   ? ?

## 2021-07-19 NOTE — Progress Notes (Signed)
DUE TO COVID-19 ONLY ONE VISITOR IS ALLOWED TO COME WITH YOU AND STAY IN THE WAITING ROOM ONLY DURING PRE OP AND PROCEDURE DAY OF SURGERY.  ? ?PCP - Dr [name] ?Cardiologist - Dr Ridley Schewe Hodgkins ?Oncology - Dr Earlie Server ? ?Chest x-ray - 07/19/21 (2V) ?EKG - 07/18/21 ?Stress Test - n/a ?ECHO TEE - 05/07/21 ?Cardiac Cath - 04/15/21 ? ?ICD Pacemaker/Loop - n/a ? ?Sleep Study -  Yes  ?CPAP - uses CPAP nightly ? ?Aspirin Instructions: Follow your surgeon's instructions on when to stop aspirin prior to surgery,  If no instructions were given by your surgeon then you will need to call the office for those instructions. ? ?Anesthesia review: Yes, Dr Gifford Shave ? ?STOP now taking any Aspirin (unless otherwise instructed by your surgeon), Aleve, Naproxen, Ibuprofen, Motrin, Advil, Goody's, BC's, all herbal medications, fish oil, and all vitamins.  ? ?Coronavirus Screening ?Do you have any of the following symptoms:  ?Cough yes/no: No ?Fever (>100.64F)  yes/no: No ?Runny nose yes/no: No ?Sore throat yes/no: No ?Difficulty breathing/shortness of breath  yes/no: Yes ? ?Have you traveled in the last 14 days and where? yes/no: No ? ?Patient verbalized understanding of instructions that were given via phone. ?

## 2021-07-19 NOTE — Telephone Encounter (Signed)
-----   Message from Valora Corporal, RN sent at 07/18/2021  4:47 PM EDT ----- Results sent via My Chart. Scheduling to call and set up repeat labs (LFT) due in one week.

## 2021-07-22 ENCOUNTER — Other Ambulatory Visit: Payer: Self-pay | Admitting: Thoracic Surgery (Cardiothoracic Vascular Surgery)

## 2021-07-22 ENCOUNTER — Encounter (HOSPITAL_COMMUNITY): Payer: Self-pay | Admitting: Pulmonary Disease

## 2021-07-22 ENCOUNTER — Ambulatory Visit (HOSPITAL_COMMUNITY)
Admission: AD | Admit: 2021-07-22 | Discharge: 2021-07-22 | Disposition: A | Discharge status: Home / Self Care | Payer: BC Managed Care – PPO | Source: Ambulatory Visit | Attending: Pulmonary Disease | Admitting: Pulmonary Disease

## 2021-07-22 ENCOUNTER — Encounter (HOSPITAL_COMMUNITY)
Admission: AD | Disposition: A | Discharge status: Home / Self Care | Payer: Self-pay | Source: Ambulatory Visit | Attending: Pulmonary Disease

## 2021-07-22 ENCOUNTER — Ambulatory Visit (HOSPITAL_COMMUNITY): Admit: 2021-07-22 | Payer: BC Managed Care – PPO | Admitting: Internal Medicine

## 2021-07-22 ENCOUNTER — Encounter (HOSPITAL_COMMUNITY): Payer: Self-pay

## 2021-07-22 ENCOUNTER — Other Ambulatory Visit: Payer: Self-pay | Admitting: *Deleted

## 2021-07-22 DIAGNOSIS — J90 Pleural effusion, not elsewhere classified: Secondary | ICD-10-CM

## 2021-07-22 DIAGNOSIS — Z539 Procedure and treatment not carried out, unspecified reason: Secondary | ICD-10-CM | POA: Diagnosis present

## 2021-07-22 DIAGNOSIS — I2510 Atherosclerotic heart disease of native coronary artery without angina pectoris: Secondary | ICD-10-CM

## 2021-07-22 DIAGNOSIS — E785 Hyperlipidemia, unspecified: Secondary | ICD-10-CM

## 2021-07-22 SURGERY — THORACENTESIS
Laterality: Left

## 2021-07-22 SURGERY — CANCELLED PROCEDURE
Laterality: Left

## 2021-07-22 NOTE — Progress Notes (Signed)
No fluid in lung to be drained. Procedure cancelled  ?

## 2021-07-22 NOTE — H&P (Signed)
Procedure cancelled ? ?Susan Nash, DO ?[facility] ?07/22/2021 5:53 PM   ? ?

## 2021-07-23 ENCOUNTER — Other Ambulatory Visit: Payer: Self-pay

## 2021-07-23 ENCOUNTER — Other Ambulatory Visit (INDEPENDENT_AMBULATORY_CARE_PROVIDER_SITE_OTHER): Payer: BC Managed Care – PPO

## 2021-07-23 VITALS — Ht 64.2 in | Wt 176.4 lb

## 2021-07-23 DIAGNOSIS — E785 Hyperlipidemia, unspecified: Secondary | ICD-10-CM | POA: Diagnosis not present

## 2021-07-23 DIAGNOSIS — Z48812 Encounter for surgical aftercare following surgery on the circulatory system: Secondary | ICD-10-CM | POA: Diagnosis present

## 2021-07-23 DIAGNOSIS — Z951 Presence of aortocoronary bypass graft: Secondary | ICD-10-CM

## 2021-07-23 DIAGNOSIS — I2510 Atherosclerotic heart disease of native coronary artery without angina pectoris: Secondary | ICD-10-CM

## 2021-07-23 NOTE — Patient Instructions (Signed)
Patient Instructions ? ?Patient Details  ?Name: Susan Weeks ?MRN: [id number] ?Date of Birth: [birth year] ?Referring Provider:  [name], MD ? ?Below are your personal goals for exercise, nutrition, and risk factors. Our goal is to help you stay on track towards obtaining and maintaining these goals. We will be discussing your progress on these goals with you throughout the program. ? ?Initial Exercise Prescription: ? Initial Exercise Prescription - 07/23/21 1100   ? ?  ? Date of Initial Exercise RX and Referring Provider  ? Date 07/23/21   ? Referring Provider [name] MD   ?  ? Treadmill  ? MPH 1.8   ? Grade 0   ? Minutes 15   ? METs 2.38   ?  ? Recumbant Elliptical  ? Level 1   ? RPM 50   ? Minutes 15   ? METs 2.2   ?  ? REL-XR  ? Level 1   ? Speed 50   ? Minutes 15   ? METs 2.2   ?  ? Track  ? Laps 26   ? Minutes 15   ? METs 2.41   ?  ? Prescription Details  ? Frequency (times per week) 3   ? Duration Progress to 30 minutes of continuous aerobic without signs/symptoms of physical distress   ?  ? Intensity  ? THRR 40-80% of Max Heartrate 104-134   ? Ratings of Perceived Exertion 11-13   ? Perceived Dyspnea 0-4   ?  ? Progression  ? Progression Continue to progress workloads to maintain intensity without signs/symptoms of physical distress.   ?  ? Resistance Training  ? Training Prescription Yes   ? Weight 3 lb   ? Reps 10-15   ? ?  ?  ? ?  ? ? ?Exercise Goals: ?Frequency: Be able to perform aerobic exercise two to three times per week in program working toward 2-5 days per week of home exercise. ? ?Intensity: Work with a perceived exertion of 11 (fairly light) - 15 (hard) while following your exercise prescription.  We will make changes to your prescription with you as you progress through the program. ?  ?Duration: Be able to do 30 to 45 minutes of continuous aerobic exercise in addition to a 5 minute warm-up and a 5 minute cool-down routine. ?  ?Nutrition Goals: ?Your personal nutrition goals  will be established when you do your nutrition analysis with the dietician. ? ?The following are general nutrition guidelines to follow: ?Cholesterol < '200mg'$ /day ?Sodium < '1500mg'$ /day ?Fiber: Women over 50 yrs - 21 grams per day ? ?Personal Goals: ? Personal Goals and Risk Factors at Admission - 07/23/21 1156   ? ?  ? Core Components/Risk Factors/Patient Goals on Admission  ?  Weight Management Yes;Weight Loss   ? Intervention Weight Management: Provide education and appropriate resources to help participant work on and attain dietary goals.;Weight Management: Develop a combined nutrition and exercise program designed to reach desired caloric intake, while maintaining appropriate intake of nutrient and fiber, sodium and fats, and appropriate energy expenditure required for the weight goal.;Weight Management/Obesity: Establish reasonable short term and long term weight goals.   ? Admit Weight 176 lb (79.8 kg)   ? Goal Weight: Short Term 172 lb (78 kg)   ? Goal Weight: Long Term 166 lb (75.3 kg)   ? Expected Outcomes Long Term: Adherence to nutrition and physical activity/exercise program aimed toward attainment of established weight goal;Short Term: Continue to assess and  modify interventions until short term weight is achieved;Weight Loss: Understanding of general recommendations for a balanced deficit meal plan, which promotes 1-2 lb weight loss per week and includes a negative energy balance of [phone] kcal/d;Understanding recommendations for meals to include 15-35% energy as protein, 25-35% energy from fat, 35-60% energy from carbohydrates, less than '200mg'$  of dietary cholesterol, 20-35 gm of total fiber daily;Understanding of distribution of calorie intake throughout the day with the consumption of 4-5 meals/snacks   ? Hypertension Yes   ? Intervention Provide education on lifestyle modifcations including regular physical activity/exercise, weight management, moderate sodium restriction and increased consumption  of fresh fruit, vegetables, and low fat dairy, alcohol moderation, and smoking cessation.;Monitor prescription use compliance.   ? Expected Outcomes Short Term: Continued assessment and intervention until BP is < 140/63m HG in hypertensive participants. < 130/883mHG in hypertensive participants with diabetes, heart failure or chronic kidney disease.;Long Term: Maintenance of blood pressure at goal levels.   ? Lipids Yes   ? Intervention Provide education and support for participant on nutrition & aerobic/resistive exercise along with prescribed medications to achieve LDL '70mg'$ , HDL >'40mg'$ .   ? Expected Outcomes Short Term: Participant states understanding of desired cholesterol values and is compliant with medications prescribed. Participant is following exercise prescription and nutrition guidelines.;Long Term: Cholesterol controlled with medications as prescribed, with individualized exercise RX and with personalized nutrition plan. Value goals: LDL < '70mg'$ , HDL > 40 mg.   ? ?  ?  ? ?  ? ? ?Tobacco Use Initial Evaluation: ?Social History  ? ?Tobacco Use  ?Smoking Status Former  ? Types: Cigarettes  ? Quit date: 1962? Years since quitting: 45.2  ?Smokeless Tobacco Never  ? ? ?Exercise Goals and Review: ? Exercise Goals   ? ? [place] 07/23/21 1156  ?  ?  ?  ?  ?  ? Exercise Goals  ? Increase Physical Activity Yes      ? Intervention Provide advice, education, support and counseling about physical activity/exercise needs.;Develop an individualized exercise prescription for aerobic and resistive training based on initial evaluation findings, risk stratification, comorbidities and participant's personal goals.      ? Expected Outcomes Short Term: Attend rehab on a regular basis to increase amount of physical activity.;Long Term: Add in home exercise to make exercise part of routine and to increase amount of physical activity.;Long Term: Exercising regularly at least 3-5 days a week.      ? Increase Strength and  Stamina Yes      ? Intervention Provide advice, education, support and counseling about physical activity/exercise needs.;Develop an individualized exercise prescription for aerobic and resistive training based on initial evaluation findings, risk stratification, comorbidities and participant's personal goals.      ? Expected Outcomes Short Term: Increase workloads from initial exercise prescription for resistance, speed, and METs.;Short Term: Perform resistance training exercises routinely during rehab and add in resistance training at home;Long Term: Improve cardiorespiratory fitness, muscular endurance and strength as measured by increased METs and functional capacity (6MWT)      ? Able to understand and use rate of perceived exertion (RPE) scale Yes      ? Intervention Provide education and explanation on how to use RPE scale      ? Expected Outcomes Short Term: Able to use RPE daily in rehab to express subjective intensity level;Long Term:  Able to use RPE to guide intensity level when exercising independently      ? Able to understand and  use Dyspnea scale Yes      ? Intervention Provide education and explanation on how to use Dyspnea scale      ? Expected Outcomes Short Term: Able to use Dyspnea scale daily in rehab to express subjective sense of shortness of breath during exertion;Long Term: Able to use Dyspnea scale to guide intensity level when exercising independently      ? Knowledge and understanding of Target Heart Rate Range (THRR) Yes      ? Intervention Provide education and explanation of THRR including how the numbers were predicted and where they are located for reference      ? Expected Outcomes Short Term: Able to state/look up THRR;Long Term: Able to use THRR to govern intensity when exercising independently;Short Term: Able to use daily as guideline for intensity in rehab      ? Able to check pulse independently Yes      ? Intervention Provide education and demonstration on how to check pulse  in carotid and radial arteries.;Review the importance of being able to check your own pulse for safety during independent exercise      ? Expected Outcomes Short Term: Able to explain why pulse checking is

## 2021-07-23 NOTE — Progress Notes (Signed)
Cardiac Individual Treatment Plan ? ?Patient Details  ?Name: Susan Weeks ?MRN: [id number] ?Date of Birth: [birth year] ?Referring Provider:   ?Flowsheet Row Cardiac Rehab from 07/23/2021 in [facility] Cardiac and Pulmonary Rehab  ?Referring Provider [name] MD  ? ?  ? ? ?Initial Encounter Date:  ?Flowsheet Row Cardiac Rehab from 07/23/2021 in [facility] Cardiac and Pulmonary Rehab  ?Date 07/23/21  ? ?  ? ? ?Visit Diagnosis: S/P CABG x 4 ? ?Patient's Home Medications on Admission: ? ?Current Outpatient Medications:  ?  acetaminophen (TYLENOL) 500 MG tablet, Take 500 mg by mouth every 6 (six) hours as needed for moderate pain., Disp: , Rfl:  ?  amLODipine (NORVASC) 5 MG tablet, TAKE 1 TABLET BY MOUTH EVERY DAY, Disp: 90 tablet, Rfl: 1 ?  aspirin EC 325 MG EC tablet, Take 1 tablet (325 mg total) by mouth daily., Disp: , Rfl:  ?  Calcium Carbonate-Vitamin D (CALCIUM 600+D PO), Take 1 tablet by mouth daily., Disp: , Rfl:  ?  metoprolol tartrate (LOPRESSOR) 25 MG tablet, Take 1 tablet (25 mg total) by mouth 2 (two) times daily., Disp: 60 tablet, Rfl: 11 ?  NON FORMULARY, Pt uses a cpap nightly, Disp: , Rfl:  ?  rosuvastatin (CRESTOR) 10 MG tablet, Take 1 tablet (10 mg total) by mouth daily., Disp: 90 tablet, Rfl: 3 ?  vitamin E 400 UNIT capsule, Take 400 Units by mouth daily. , Disp: , Rfl:  ? ?Past Medical History: ?Past Medical History:  ?Diagnosis Date  ? Abnormal liver function   ? Colon cancer ([place])   ? adenocarcinoma of the cecum s/p partial colon resection  ? Coronary artery disease   ? a. 03/2021 Cor CTA: sev LAD dzs; b. 04/2021 Cath: lM nl, LAD 135m(unable to wire), D1 60, LCX nl, OM3 60, LPAV 100, RCA 90p; c. 05/2021 CABG x 4: LIMA->LAD, VG->RPDA, VG->OM1, VG->OM3.  ? History of colon polyps   ? Hyperlipidemia   ? Hypertension   ? [place]   ? a. 05/2021 Brief PAF after CABG-->amio.  ? Sleep apnea 12/28/2017  ? Systolic murmur   ? a. 173/2202Echo: EF 60-65%, no rwma, GrI DD, RVSP  28.887mg. Mild-mod MR.  ? ? ?Tobacco Use: ?Social History  ? ?Tobacco Use  ?Smoking Status Former  ? Types: Cigarettes  ? Quit date: 1962? Years since quitting: 45.2  ?Smokeless Tobacco Never  ? ? ?Labs: ?Recent Review Flowsheet Data   ? ? Labs for ITP Cardiac and Pulmonary Rehab Latest Ref Rng & Units 05/07/2021 05/07/2021 05/07/2021 05/08/2021 07/18/2021  ? Cholestrol 0 - 200 mg/dL - - - - -  ? LDLCALC 0 - 99 mg/dL - - - - -  ? LDLDIRECT 0 - 99 mg/dL - - - - 53.0  ? HDL >40 mg/dL - - - - -  ? Trlycerides <150 mg/dL - - - - -  ? Hemoglobin A1c 4.8 - 5.6 % - - - - -  ? PHART 7.350 - 7.450 7.290(L) 7.320(L) 7.370 7.382 -  ? PCO2ART 32.0 - 48.0 mmHg 45.6 35.6 37.1 22.9(L) -  ? HCO3 20.0 - 28.0 mmol/L 22.0 18.3(L) 21.3 13.5(L) -  ? TCO2 22 - 32 mmol/L 23 19(L) 22 14(L) -  ? ACIDBASEDEF 0.0 - 2.0 mmol/L 5.0(H) 7.0(H) 3.0(H) 10.0(H) -  ? O2SAT % 98.0 98.0 98.0 99.0 -  ? ?  ? ? ? ?Exercise Target Goals: ?Exercise Program Goal: ?Individual exercise prescription set using results from  initial 6 min walk test and THRR while considering  patient?s activity barriers and safety.  ? ?Exercise Prescription Goal: ?Initial exercise prescription builds to 30-45 minutes a day of aerobic activity, 2-3 days per week.  Home exercise guidelines will be given to patient during program as part of exercise prescription that the participant will acknowledge. ? ? ?Education: Aerobic Exercise: ?- Group verbal and visual presentation on the components of exercise prescription. Introduces F.I.T.T principle from ACSM for exercise prescriptions.  Reviews F.I.T.T. principles of aerobic exercise including progression. Written material given at graduation. ? ? ?Education: Resistance Exercise: ?- Group verbal and visual presentation on the components of exercise prescription. Introduces F.I.T.T principle from ACSM for exercise prescriptions  Reviews F.I.T.T. principles of resistance exercise including progression. Written material given at graduation. ? ?   ?Education: Exercise & Equipment Safety: ?- Individual verbal instruction and demonstration of equipment use and safety with use of the equipment. ?Flowsheet Row Cardiac Rehab from 07/23/2021 in [facility] Cardiac and Pulmonary Rehab  ?Education need identified 07/23/21  ?Date 07/23/21  ?Educator KL1  ?Instruction Review Code 5- Refused Teaching  ? ?  ? ? ?Education: Exercise Physiology & General Exercise Guidelines: ?- Group verbal and written instruction with models to review the exercise physiology of the cardiovascular system and associated critical values. Provides general exercise guidelines with specific guidelines to those with heart or lung disease.  ? ? ?Education: Flexibility, Balance, Mind/Body Relaxation: ?- Group verbal and visual presentation with interactive activity on the components of exercise prescription. Introduces F.I.T.T principle from ACSM for exercise prescriptions. Reviews F.I.T.T. principles of flexibility and balance exercise training including progression. Also discusses the mind body connection.  Reviews various relaxation techniques to help reduce and manage stress (i.e. Deep breathing, progressive muscle relaxation, and visualization). Balance handout provided to take home. Written material given at graduation. ? ? ?Activity Barriers & Risk Stratification: ? Activity Barriers & Cardiac Risk Stratification - 07/23/21 1137   ? ?  ? Activity Barriers & Cardiac Risk Stratification  ? Activity Barriers Incisional Pain;Deconditioning;Muscular Weakness   ? Cardiac Risk Stratification High   ? ?  ?  ? ?  ? ? ?6 Minute Walk: ? 6 Minute Walk   ? ? [place] Name 07/23/21 1138  ?  ?  ?  ? 6 Minute Walk  ? Phase Initial    ? Distance 1065 feet    ? Walk Time 6 minutes    ? # of Rest Breaks 0    ? MPH 2.01    ? METS 2.23    ? RPE 11    ? Perceived Dyspnea  1    ? VO2 Peak 7.81    ? Symptoms No    ? Resting HR 75 bpm    ? Resting BP 126/76    ? Resting Oxygen Saturation  99 %    ? Exercise Oxygen Saturation   during 6 min walk 95 %    ? Max Ex. HR 90 bpm    ? Max Ex. BP 144/76    ? 2 Minute Post BP 124/74    ? ?  ?  ? ?  ? ? ?Oxygen Initial Assessment: ? ? ?Oxygen Re-Evaluation: ? ? ?Oxygen Discharge (Final Oxygen Re-Evaluation): ? ? ?Initial Exercise Prescription: ? Initial Exercise Prescription - 07/23/21 1100   ? ?  ? Date of Initial Exercise RX and Referring Provider  ? Date 07/23/21   ? Referring Provider [name] MD   ?  ?  Treadmill  ? MPH 1.8   ? Grade 0   ? Minutes 15   ? METs 2.38   ?  ? Recumbant Elliptical  ? Level 1   ? RPM 50   ? Minutes 15   ? METs 2.2   ?  ? REL-XR  ? Level 1   ? Speed 50   ? Minutes 15   ? METs 2.2   ?  ? Track  ? Laps 26   ? Minutes 15   ? METs 2.41   ?  ? Prescription Details  ? Frequency (times per week) 3   ? Duration Progress to 30 minutes of continuous aerobic without signs/symptoms of physical distress   ?  ? Intensity  ? THRR 40-80% of Max Heartrate 104-134   ? Ratings of Perceived Exertion 11-13   ? Perceived Dyspnea 0-4   ?  ? Progression  ? Progression Continue to progress workloads to maintain intensity without signs/symptoms of physical distress.   ?  ? Resistance Training  ? Training Prescription Yes   ? Weight 3 lb   ? Reps 10-15   ? ?  ?  ? ?  ? ? ?Perform Capillary Blood Glucose checks as needed. ? ?Exercise Prescription Changes: ? ? Exercise Prescription Changes   ? ? [place] Name 07/23/21 1100  ?  ?  ?  ?  ?  ? Response to Exercise  ? Blood Pressure (Admit) 126/76      ? Blood Pressure (Exercise) 144/76      ? Blood Pressure (Exit) 124/74      ? Heart Rate (Admit) 75 bpm      ? Heart Rate (Exercise) 90 bpm      ? Heart Rate (Exit) 78 bpm      ? Oxygen Saturation (Admit) 99 %      ? Oxygen Saturation (Exercise) 95 %      ? Oxygen Saturation (Exit) 97 %      ? Rating of Perceived Exertion (Exercise) 11      ? Perceived Dyspnea (Exercise) 1      ? Symptoms none      ? Comments walk test results      ? ?  ?  ? ?  ? ? ?Exercise Comments: ? ? ?Exercise Goals and  Review: ? ? Exercise Goals   ? ? [place] Name 07/23/21 1156  ?  ?  ?  ?  ?  ? Exercise Goals  ? Increase Physical Activity Yes      ? Intervention Provide advice, education, support and counseling about physical activity/ex

## 2021-07-24 ENCOUNTER — Ambulatory Visit
Admission: RE | Admit: 2021-07-24 | Discharge: 2021-07-24 | Disposition: A | Discharge status: Home / Self Care | Payer: BC Managed Care – PPO | Source: Ambulatory Visit | Attending: Thoracic Surgery (Cardiothoracic Vascular Surgery) | Admitting: Thoracic Surgery (Cardiothoracic Vascular Surgery)

## 2021-07-24 ENCOUNTER — Other Ambulatory Visit: Payer: Self-pay | Admitting: Thoracic Surgery (Cardiothoracic Vascular Surgery)

## 2021-07-24 ENCOUNTER — Encounter: Payer: Self-pay | Admitting: *Deleted

## 2021-07-24 DIAGNOSIS — J90 Pleural effusion, not elsewhere classified: Secondary | ICD-10-CM

## 2021-07-24 DIAGNOSIS — Z951 Presence of aortocoronary bypass graft: Secondary | ICD-10-CM

## 2021-07-24 LAB — HEPATIC FUNCTION PANEL
ALT: 101 IU/L — ABNORMAL HIGH (ref 0–32)
AST: 128 IU/L — ABNORMAL HIGH (ref 0–40)
Albumin: 4.3 g/dL (ref 3.7–4.7)
Alkaline Phosphatase: 98 IU/L (ref 44–121)
Bilirubin Total: 0.5 mg/dL (ref 0.0–1.2)
Bilirubin, Direct: 0.14 mg/dL (ref 0.00–0.40)
Total Protein: 7.2 g/dL (ref 6.0–8.5)

## 2021-07-24 NOTE — Progress Notes (Signed)
Cardiac Individual Treatment Plan ? ?Patient Details  ?Name: Susan Weeks ?MRN: [id number] ?Date of Birth: 21-Aug-1950 ?Referring Provider:   ?Flowsheet Row Cardiac Rehab from 07/23/2021 in [facility] Cardiac and Pulmonary Rehab  ?Referring Provider [name] MD  ? ?  ? ? ?Initial Encounter Date:  ?Flowsheet Row Cardiac Rehab from 07/23/2021 in [facility] Cardiac and Pulmonary Rehab  ?Date 07/23/21  ? ?  ? ? ?Visit Diagnosis: S/P CABG x 4 ? ?Patient's Home Medications on Admission: ? ?Current Outpatient Medications:  ?  acetaminophen (TYLENOL) 500 MG tablet, Take 500 mg by mouth every 6 (six) hours as needed for moderate pain., Disp: , Rfl:  ?  amLODipine (NORVASC) 5 MG tablet, TAKE 1 TABLET BY MOUTH EVERY DAY, Disp: 90 tablet, Rfl: 1 ?  aspirin EC 325 MG EC tablet, Take 1 tablet (325 mg total) by mouth daily., Disp: , Rfl:  ?  Calcium Carbonate-Vitamin D (CALCIUM 600+D PO), Take 1 tablet by mouth daily., Disp: , Rfl:  ?  metoprolol tartrate (LOPRESSOR) 25 MG tablet, Take 1 tablet (25 mg total) by mouth 2 (two) times daily., Disp: 60 tablet, Rfl: 11 ?  NON FORMULARY, Pt uses a cpap nightly, Disp: , Rfl:  ?  rosuvastatin (CRESTOR) 10 MG tablet, Take 1 tablet (10 mg total) by mouth daily., Disp: 90 tablet, Rfl: 3 ?  vitamin E 400 UNIT capsule, Take 400 Units by mouth daily. , Disp: , Rfl:  ? ?Past Medical History: ?Past Medical History:  ?Diagnosis Date  ? Abnormal liver function   ? Colon cancer ([place])   ? adenocarcinoma of the cecum s/p partial colon resection  ? Coronary artery disease   ? a. 03/2021 Cor CTA: sev LAD dzs; b. 04/2021 Cath: lM nl, LAD 15m(unable to wire), D1 60, LCX nl, OM3 60, LPAV 100, RCA 90p; c. 05/2021 CABG x 4: LIMA->LAD, VG->RPDA, VG->OM1, VG->OM3.  ? History of colon polyps   ? Hyperlipidemia   ? Hypertension   ? [place]   ? a. 05/2021 Brief PAF after CABG-->amio.  ? Sleep apnea 12/28/2017  ? Systolic murmur   ? a. 154/6503Echo: EF 60-65%, no rwma, GrI DD, RVSP  28.839mg. Mild-mod MR.  ? ? ?Tobacco Use: ?Social History  ? ?Tobacco Use  ?Smoking Status Former  ? Types: Cigarettes  ? Quit date: 1957? Years since quitting: 45.2  ?Smokeless Tobacco Never  ? ? ?Labs: ?Review Flowsheet   ? ?  ?  Latest Ref Rng & Units 11/30/2020 05/03/2021 05/07/2021 05/08/2021  ?Labs for ITP Cardiac and Pulmonary Rehab  ?Cholestrol 0 - 200 mg/dL 147       ?LDL (calc) 0 - 99 mg/dL 70       ?Direct LDL 0 - 99 mg/dL      ?HDL-C >40 mg/dL 61       ?Trlycerides <150 mg/dL 79       ?Hemoglobin A1c 4.8 - 5.6 %  5.8      ?PH, Arterial 7.350 - 7.450  7.410   7.370    ? 7.320    ? 7.290    ? 7.466    ? 7.531    ? 7.535    ? 7.530   7.382    ?PCO2 arterial 32.0 - 48.0 mmHg  41.9   37.1    ? 35.6    ? 45.6    ? 37.9    ? 31.2    ? 31.3    ?  32.7   22.9    ?Bicarbonate 20.0 - 28.0 mmol/L  26.0   21.3    ? 18.3    ? 22.0    ? 27.3    ? 26.1    ? 26.5    ? 22.8    ? 27.3   13.5    ?TCO2 22 - 32 mmol/L   22    ? 19    ? 23    ? 25    ? 28    ? 27    ? 27    ? 26    ? 24    ? 28    ? 24    ? 26   14    ?Acid-base deficit 0.0 - 2.0 mmol/L   3.0    ? 7.0    ? 5.0    ? 1.0   10.0    ?O2 Saturation %  97.9   98.0    ? 98.0    ? 98.0    ? 100.0    ? 100.0    ? 100.0    ? 73.0    ? 100.0   99.0    ? ?  07/18/2021  ?Labs for ITP Cardiac and Pulmonary Rehab  ?Cholestrol   ?LDL (calc)   ?Direct LDL 53.0    ?HDL-C   ?Trlycerides   ?Hemoglobin A1c   ?PH, Arterial   ?PCO2 arterial   ?Bicarbonate   ?TCO2   ?Acid-base deficit   ?O2 Saturation   ?  ? ? Multiple values from one day are sorted in reverse-chronological order  ?  ?  ? ? ? ?Exercise Target Goals: ?Exercise Program Goal: ?Individual exercise prescription set using results from initial 6 min walk test and THRR while considering  patient?s activity barriers and safety.  ? ?Exercise Prescription Goal: ?Initial exercise prescription builds to 30-45 minutes a day of aerobic activity, 2-3 days per week.  Home exercise guidelines will be given to patient during program as part  of exercise prescription that the participant will acknowledge. ? ? ?Education: Aerobic Exercise: ?- Group verbal and visual presentation on the components of exercise prescription. Introduces F.I.T.T principle from ACSM for exercise prescriptions.  Reviews F.I.T.T. principles of aerobic exercise including progression. Written material given at graduation. ? ? ?Education: Resistance Exercise: ?- Group verbal and visual presentation on the components of exercise prescription. Introduces F.I.T.T principle from ACSM for exercise prescriptions  Reviews F.I.T.T. principles of resistance exercise including progression. Written material given at graduation. ? ?  ?Education: Exercise & Equipment Safety: ?- Individual verbal instruction and demonstration of equipment use and safety with use of the equipment. ?Flowsheet Row Cardiac Rehab from 07/23/2021 in [facility] Cardiac and Pulmonary Rehab  ?Education need identified 07/23/21  ?Date 07/23/21  ?Educator KL1  ?Instruction Review Code 5- Refused Teaching  ? ?  ? ? ?Education: Exercise Physiology & General Exercise Guidelines: ?- Group verbal and written instruction with models to review the exercise physiology of the cardiovascular system and associated critical values. Provides general exercise guidelines with specific guidelines to those with heart or lung disease.  ? ? ?Education: Flexibility, Balance, Mind/Body Relaxation: ?- Group verbal and visual presentation with interactive activity on the components of exercise prescription. Introduces F.I.T.T principle from ACSM for exercise prescriptions. Reviews F.I.T.T. principles of flexibility and balance exercise training including progression. Also discusses the mind body connection.  Reviews various relaxation techniques to help reduce and manage stress (i.e. Deep breathing, progressive  muscle relaxation, and visualization). Balance handout provided to take home. Written material given at graduation. ? ? ?Activity Barriers &  Risk Stratification: ? Activity Barriers & Cardiac Risk Stratification - 07/23/21 1137   ? ?  ? Activity Barriers & Cardiac Risk Stratification  ? Activity Barriers Incisional Pain;Deconditioning;Muscular Weakness   ? Cardiac Risk Stratification High   ? ?  ?  ? ?  ? ? ?6 Minute Walk: ? 6 Minute Walk   ? ? [place] Name 07/23/21 1138  ?  ?  ?  ? 6 Minute Walk  ? Phase Initial    ? Distance 1065 feet    ? Walk Time 6 minutes    ? # of Rest Breaks 0    ? MPH 2.01    ? METS 2.23    ? RPE 11    ? Perceived Dyspnea  1    ? VO2 Peak 7.81    ? Symptoms No    ? Resting HR 75 bpm    ? Resting BP 126/76    ? Resting Oxygen Saturation  99 %    ? Exercise Oxygen Saturation  during 6 min walk 95 %    ? Max Ex. HR 90 bpm    ? Max Ex. BP 144/76    ? 2 Minute Post BP 124/74    ? ?  ?  ? ?  ? ? ?Oxygen Initial Assessment: ? ? ?Oxygen Re-Evaluation: ? ? ?Oxygen Discharge (Final Oxygen Re-Evaluation): ? ? ?Initial Exercise Prescription: ? Initial Exercise Prescription - 07/23/21 1100   ? ?  ? Date of Initial Exercise RX and Referring Provider  ? Date 07/23/21   ? Referring Provider [name] MD   ?  ? Treadmill  ? MPH 1.8   ? Grade 0   ? Minutes 15   ? METs 2.38   ?  ? Recumbant Elliptical  ? Level 1   ? RPM 50   ? Minutes 15   ? METs 2.2   ?  ? REL-XR  ? Level 1   ? Speed 50   ? Minutes 15   ? METs 2.2   ?  ? Track  ? Laps 26   ? Minutes 15   ? METs 2.41   ?  ? Prescription Details  ? Frequency (times per week) 3   ? Duration Progress to 30 minutes of continuous aerobic without signs/symptoms of physical distress   ?  ? Intensity  ? THRR 40-80% of Max Heartrate 104-134   ? Ratings of Perceived Exertion 11-13   ? Perceived Dyspnea 0-4   ?  ? Progression  ? Progression Continue to progress workloads to maintain intensity without signs/symptoms of physical distress.   ?  ? Resistance Training  ? Training Prescription Yes   ? Weight 3 lb   ? Reps 10-15   ? ?  ?  ? ?  ? ? ?Perform Capillary Blood Glucose checks as needed. ? ?Exercise  Prescription Changes: ? ? Exercise Prescription Changes   ? ? [place] Name 07/23/21 1100  ?  ?  ?  ?  ?  ? Response to Exercise  ? Blood Pressure (Admit) 126/76      ? Blood Pressure (Exercise) 144/76      ? Blood Pr

## 2021-07-25 ENCOUNTER — Telehealth: Payer: Self-pay | Admitting: *Deleted

## 2021-07-25 DIAGNOSIS — I2510 Atherosclerotic heart disease of native coronary artery without angina pectoris: Secondary | ICD-10-CM

## 2021-07-25 DIAGNOSIS — E785 Hyperlipidemia, unspecified: Secondary | ICD-10-CM

## 2021-07-25 DIAGNOSIS — R945 Abnormal results of liver function studies: Secondary | ICD-10-CM

## 2021-07-25 NOTE — Telephone Encounter (Signed)
Patient returning call.

## 2021-07-25 NOTE — Telephone Encounter (Signed)
Left voicemail message to call back for review of results and recommendations.  

## 2021-07-25 NOTE — Telephone Encounter (Signed)
-----   Message from Theora Gianotti, NP sent at 07/24/2021  9:45 AM EDT ----- ?Ast and alt are higher since last check.  Hold crestor and follow-up LFTs in 2 wks.  If improved, we will likely resume at previous dose of '5mg'$  daily. ?

## 2021-07-25 NOTE — Telephone Encounter (Signed)
Spoke with patient and reviewed results and recommendations. Instructed her to stop the Crestor (Rosuvastatin) and that we would like repeat labs. She was agreeable with plan. Advised that around April 7 th she should go to the [facility] and check in at registration. She verbalized understanding of our conversation with no further questions at this time.  ?

## 2021-07-26 ENCOUNTER — Ambulatory Visit
Admission: RE | Admit: 2021-07-26 | Discharge: 2021-07-26 | Disposition: A | Discharge status: Home / Self Care | Payer: BC Managed Care – PPO | Source: Ambulatory Visit | Attending: Thoracic Surgery (Cardiothoracic Vascular Surgery) | Admitting: Thoracic Surgery (Cardiothoracic Vascular Surgery)

## 2021-07-26 ENCOUNTER — Other Ambulatory Visit: Payer: Self-pay | Admitting: Thoracic Surgery (Cardiothoracic Vascular Surgery)

## 2021-07-26 ENCOUNTER — Ambulatory Visit (INDEPENDENT_AMBULATORY_CARE_PROVIDER_SITE_OTHER): Payer: Self-pay | Admitting: Thoracic Surgery (Cardiothoracic Vascular Surgery)

## 2021-07-26 ENCOUNTER — Other Ambulatory Visit: Payer: Self-pay

## 2021-07-26 VITALS — BP 160/80 | HR 60 | Ht 63.0 in | Wt 176.0 lb

## 2021-07-26 DIAGNOSIS — J90 Pleural effusion, not elsewhere classified: Secondary | ICD-10-CM

## 2021-07-26 DIAGNOSIS — Z951 Presence of aortocoronary bypass graft: Secondary | ICD-10-CM

## 2021-07-26 DIAGNOSIS — I2510 Atherosclerotic heart disease of native coronary artery without angina pectoris: Secondary | ICD-10-CM

## 2021-07-26 DIAGNOSIS — J986 Disorders of diaphragm: Secondary | ICD-10-CM

## 2021-07-26 NOTE — Progress Notes (Signed)
? ?   ?  [address] ?      [place] [address] ?            [phone]       ? ?[name] ?[place] Record #[id number] ?Date of Birth: December 13, 1950 ? ?Referring: [name], MD ?Primary Care: [name], MD ?Primary Cardiologist:Muhammad [name], MD ? ?Reason for visit:   follow-up ? ?History of Present Illness:     ?Susan Weeks comes in in follow-up.  She underwent a CABG in January of this year.  Chest x-ray have shown persistent moderate to large left-sided effusion however on most recent ultrasound it was evident that her diaphragm was paralyzed.  She subsequently underwent a sniff test which confirmed this. ? ?Regards her symptoms she denies any shortness of breath, and is ready to start cardiac rehab. ? ?Physical Exam: ?BP (!) 160/80   Pulse 60   Ht '5\' 3"'$  (1.6 m)   Wt 176 lb (79.8 kg)   LMP 06/28/1982   SpO2 98% Comment: RA  BMI 31.18 kg/m?  ? ?Alert NAD ?Abdomen, ND ?No peripheral edema ? ? ?Diagnostic Studies & Laboratory data: ?Sniff test: Positive for elevated left hemidiaphragm with paradoxical motion. ?  ? ?Assessment / Plan:   ?71 year old female status post CABG with a paralyzed left hemidiaphragm.  Currently she is not very short of breath.  I will see her back in 3 months with another chest x-ray.  If she is symptomatic then we will discuss options for robotic assisted diaphragm plication. ? ? ?Lucile Crater Tenesia Escudero ?07/26/2021 10:16 AM ? ? ? ? ? ? ?

## 2021-07-29 ENCOUNTER — Other Ambulatory Visit: Payer: Self-pay

## 2021-07-29 ENCOUNTER — Encounter: Payer: BC Managed Care – PPO | Admitting: *Deleted

## 2021-07-29 DIAGNOSIS — Z48812 Encounter for surgical aftercare following surgery on the circulatory system: Secondary | ICD-10-CM | POA: Diagnosis not present

## 2021-07-29 DIAGNOSIS — Z951 Presence of aortocoronary bypass graft: Secondary | ICD-10-CM

## 2021-07-29 NOTE — Progress Notes (Signed)
Daily Session Note ? ?Patient Details  ?Name: Susan Weeks ?MRN: [id number] ?Date of Birth: 29-May-1950 ?Referring Provider:   ?Flowsheet Row Cardiac Rehab from 07/23/2021 in [facility] Cardiac and Pulmonary Rehab  ?Referring Provider [name] MD  ? ?  ? ? ?Encounter Date: 07/29/2021 ? ?Check In: ? Session Check In - 07/29/21 0836   ? ?  ? Check-In  ? Supervising physician immediately available to respond to emergencies See telemetry face sheet for immediately available ER MD   ? Location [facility]-Cardiac & Pulmonary Rehab   ? Staff Present Heath Lark, RN, BSN, CCRP;Joseph [place], RCP,RRT,BSRT;Kelly Cartwright, [place], ACSM CEP, Exercise Physiologist   ? Virtual Visit No   ? Medication changes reported     No   ? Fall or balance concerns reported    No   ? Warm-up and Cool-down Performed on first and last piece of equipment   ? Resistance Training Performed Yes   ? VAD Patient? No   ? PAD/SET Patient? No   ?  ? Pain Assessment  ? Currently in Pain? No/denies   ? ?  ?  ? ?  ? ? ? ? ? ?Social History  ? ?Tobacco Use  ?Smoking Status Former  ? Types: Cigarettes  ? Quit date: 69  ? Years since quitting: 45.2  ?Smokeless Tobacco Never  ? ? ?Goals Met:  ?Exercise tolerated well ?Personal goals reviewed ?No report of concerns or symptoms today ? ?Goals Unmet:  ?Not Applicable ? ?Comments: First full day of exercise!  Patient was oriented to gym and equipment including functions, settings, policies, and procedures.  Patient's individual exercise prescription and treatment plan were reviewed.  All starting workloads were established based on the results of the 6 minute walk test done at initial orientation visit.  The plan for exercise progression was also introduced and progression will be customized based on patient's performance and goals. ? ? ? ?Dr. Emily Filbert is Medical Director for [place].  ?Dr. Ottie Glazier is Medical Director for [facility] Pulmonary Rehabilitation. ?

## 2021-07-31 ENCOUNTER — Other Ambulatory Visit: Payer: Self-pay

## 2021-07-31 DIAGNOSIS — Z48812 Encounter for surgical aftercare following surgery on the circulatory system: Secondary | ICD-10-CM | POA: Diagnosis not present

## 2021-07-31 DIAGNOSIS — Z951 Presence of aortocoronary bypass graft: Secondary | ICD-10-CM

## 2021-07-31 NOTE — Progress Notes (Signed)
Daily Session Note ? ?Patient Details  ?Name: Susan Weeks ?MRN: [id number] ?Date of Birth: 08/19/1950 ?Referring Provider:   ?Flowsheet Row Cardiac Rehab from 07/23/2021 in [facility] Cardiac and Pulmonary Rehab  ?Referring Provider [name] MD  ? ?  ? ? ?Encounter Date: 07/31/2021 ? ?Check In: ? Session Check In - 07/31/21 0750   ? ?  ? Check-In  ? Supervising physician immediately available to respond to emergencies See telemetry face sheet for immediately available ER MD   ? Location [facility]-Cardiac & Pulmonary Rehab   ? Staff Present Birdie Sons, MPA, RN;Joseph [place], RCP,RRT,BSRT;Amanda Sommer, BA, ACSM CEP, Exercise Physiologist;Jessica [place], MA, RCEP, CCRP, CCET   ? Virtual Visit No   ? Medication changes reported     No   ? Fall or balance concerns reported    No   ? Warm-up and Cool-down Performed on first and last piece of equipment   ? Resistance Training Performed Yes   ? VAD Patient? No   ? PAD/SET Patient? No   ?  ? Pain Assessment  ? Currently in Pain? No/denies   ? ?  ?  ? ?  ? ? ? ? ? ?Social History  ? ?Tobacco Use  ?Smoking Status Former  ? Types: Cigarettes  ? Quit date: 73  ? Years since quitting: 45.2  ?Smokeless Tobacco Never  ? ? ?Goals Met:  ?Independence with exercise equipment ?Exercise tolerated well ?No report of concerns or symptoms today ?Strength training completed today ? ?Goals Unmet:  ?Not Applicable ? ?Comments: Pt able to follow exercise prescription today without complaint.  Will continue to monitor for progression. ? ? ? ?Dr. Emily Filbert is Medical Director for [place].  ?Dr. Ottie Glazier is Medical Director for [facility] Pulmonary Rehabilitation. ?

## 2021-08-02 ENCOUNTER — Encounter: Payer: BC Managed Care – PPO | Admitting: *Deleted

## 2021-08-02 DIAGNOSIS — Z48812 Encounter for surgical aftercare following surgery on the circulatory system: Secondary | ICD-10-CM | POA: Diagnosis not present

## 2021-08-02 DIAGNOSIS — Z951 Presence of aortocoronary bypass graft: Secondary | ICD-10-CM

## 2021-08-02 NOTE — Progress Notes (Signed)
Daily Session Note ? ?Patient Details  ?Name: Susan Weeks ?MRN: [id number] ?Date of Birth: 02/09/1951 ?Referring Provider:   ?Flowsheet Row Cardiac Rehab from 07/23/2021 in [facility] Cardiac and Pulmonary Rehab  ?Referring Provider [name] MD  ? ?  ? ? ?Encounter Date: 08/02/2021 ? ?Check In: ? Session Check In - 08/02/21 0834   ? ?  ? Check-In  ? Supervising physician immediately available to respond to emergencies See telemetry face sheet for immediately available ER MD   ? Location [facility]-Cardiac & Pulmonary Rehab   ? Staff Present Heath Lark, RN, BSN, CCRP;Joseph [place], RCP,RRT,BSRT;Melissa [place], [place], LDN   ? Virtual Visit No   ? Medication changes reported     No   ? Fall or balance concerns reported    No   ? Warm-up and Cool-down Performed on first and last piece of equipment   ? Resistance Training Performed Yes   ? VAD Patient? No   ? PAD/SET Patient? No   ?  ? Pain Assessment  ? Currently in Pain? No/denies   ? ?  ?  ? ?  ? ? ? ? ? ?Social History  ? ?Tobacco Use  ?Smoking Status Former  ? Types: Cigarettes  ? Quit date: 67  ? Years since quitting: 45.2  ?Smokeless Tobacco Never  ? ? ?Goals Met:  ?Independence with exercise equipment ?Exercise tolerated well ?No report of concerns or symptoms today ? ?Goals Unmet:  ?Not Applicable ? ?Comments: Pt able to follow exercise prescription today without complaint.  Will continue to monitor for progression. ? ? ? ?Dr. Emily Filbert is Medical Director for [place].  ?Dr. Ottie Glazier is Medical Director for [facility] Pulmonary Rehabilitation. ?

## 2021-08-05 ENCOUNTER — Encounter: Payer: BC Managed Care – PPO | Attending: Cardiovascular Disease | Admitting: *Deleted

## 2021-08-05 DIAGNOSIS — Z48812 Encounter for surgical aftercare following surgery on the circulatory system: Secondary | ICD-10-CM | POA: Diagnosis not present

## 2021-08-05 DIAGNOSIS — Z951 Presence of aortocoronary bypass graft: Secondary | ICD-10-CM | POA: Insufficient documentation

## 2021-08-05 NOTE — Progress Notes (Signed)
Completed initial RD consultation ?

## 2021-08-05 NOTE — Progress Notes (Signed)
Daily Session Note ? ?Patient Details  ?Name: Susan Weeks ?MRN: [id number] ?Date of Birth: May 25, 1950 ?Referring Provider:   ?Flowsheet Row Cardiac Rehab from 07/23/2021 in [facility] Cardiac and Pulmonary Rehab  ?Referring Provider [name] MD  ? ?  ? ? ?Encounter Date: 08/05/2021 ? ?Check In: ? Session Check In - 08/05/21 0759   ? ?  ? Check-In  ? Supervising physician immediately available to respond to emergencies See telemetry face sheet for immediately available ER MD   ? Location [facility]-Cardiac & Pulmonary Rehab   ? Staff Present Heath Lark, RN, BSN, CCRP;Joseph [place], RCP,RRT,BSRT;Kelly [place], [place], ACSM CEP, Exercise Physiologist   ? Virtual Visit No   ? Medication changes reported     No   ? Fall or balance concerns reported    No   ? Warm-up and Cool-down Performed on first and last piece of equipment   ? Resistance Training Performed Yes   ? VAD Patient? No   ? PAD/SET Patient? No   ?  ? Pain Assessment  ? Currently in Pain? No/denies   ? ?  ?  ? ?  ? ? ? ? ? ?Social History  ? ?Tobacco Use  ?Smoking Status Former  ? Types: Cigarettes  ? Quit date: 32  ? Years since quitting: 45.2  ?Smokeless Tobacco Never  ? ? ?Goals Met:  ?Exercise tolerated well ?No report of concerns or symptoms today ? ?Goals Unmet:  ?Not Applicable ? ?Comments: Pt able to follow exercise prescription today without complaint.  Will continue to monitor for progression. ? ? ? ?Dr. Emily Filbert is Medical Director for [place].  ?Dr. Ottie Glazier is Medical Director for [facility] Pulmonary Rehabilitation. ?

## 2021-08-07 DIAGNOSIS — Z48812 Encounter for surgical aftercare following surgery on the circulatory system: Secondary | ICD-10-CM | POA: Diagnosis not present

## 2021-08-07 DIAGNOSIS — Z951 Presence of aortocoronary bypass graft: Secondary | ICD-10-CM

## 2021-08-07 NOTE — Progress Notes (Signed)
Daily Session Note ? ?Patient Details  ?Name: Susan Weeks ?MRN: [id number] ?Date of Birth: [birth year] ?Referring Provider:   ?Flowsheet Row Cardiac Rehab from 07/23/2021 in [facility] Cardiac and Pulmonary Rehab  ?Referring Provider [name] MD  ? ?  ? ? ?Encounter Date: 08/07/2021 ? ?Check In: ? Session Check In - 08/07/21 0755   ? ?  ? Check-In  ? Supervising physician immediately available to respond to emergencies See telemetry face sheet for immediately available ER MD   ? Location [facility]-Cardiac & Pulmonary Rehab   ? Staff Present Birdie Sons, MPA, RN;Joseph [place], [place], MA, RCEP, CCRP, CCET;Melissa [place], [place], LDN   ? Virtual Visit No   ? Medication changes reported     No   ? Fall or balance concerns reported    No   ? Warm-up and Cool-down Performed on first and last piece of equipment   ? Resistance Training Performed Yes   ? VAD Patient? No   ? PAD/SET Patient? No   ?  ? Pain Assessment  ? Currently in Pain? No/denies   ? ?  ?  ? ?  ? ? ? ? ? ?Social History  ? ?Tobacco Use  ?Smoking Status Former  ? Types: Cigarettes  ? Quit date: 41  ? Years since quitting: 45.2  ?Smokeless Tobacco Never  ? ? ?Goals Met:  ?Independence with exercise equipment ?Exercise tolerated well ?No report of concerns or symptoms today ?Strength training completed today ? ?Goals Unmet:  ?Not Applicable ? ?Comments: Pt able to follow exercise prescription today without complaint.  Will continue to monitor for progression. ? ? ? ?Dr. Emily Filbert is Medical Director for [place].  ?Dr. Ottie Glazier is Medical Director for [facility] Pulmonary Rehabilitation. ?

## 2021-08-09 ENCOUNTER — Encounter: Payer: BC Managed Care – PPO | Admitting: *Deleted

## 2021-08-09 DIAGNOSIS — Z951 Presence of aortocoronary bypass graft: Secondary | ICD-10-CM

## 2021-08-09 DIAGNOSIS — Z48812 Encounter for surgical aftercare following surgery on the circulatory system: Secondary | ICD-10-CM | POA: Diagnosis not present

## 2021-08-09 NOTE — Progress Notes (Signed)
Daily Session Note ? ?Patient Details  ?Name: Susan Weeks ?MRN: [id number] ?Date of Birth: 08-Dec-1950 ?Referring Provider:   ?Flowsheet Row Cardiac Rehab from 07/23/2021 in [facility] Cardiac and Pulmonary Rehab  ?Referring Provider [name] MD  ? ?  ? ? ?Encounter Date: 08/09/2021 ? ?Check In: ? Session Check In - 08/09/21 0814   ? ?  ? Check-In  ? Supervising physician immediately available to respond to emergencies See telemetry face sheet for immediately available ER MD   ? Location [facility]-Cardiac & Pulmonary Rehab   ? Staff Present Heath Lark, RN, BSN, CCRP;Laureen Owens Shark, BS, RRT, CPFT;Joseph [place], [place]   ? Virtual Visit No   ? Medication changes reported     No   ? Fall or balance concerns reported    No   ? Warm-up and Cool-down Performed on first and last piece of equipment   ? Resistance Training Performed Yes   ? VAD Patient? No   ? PAD/SET Patient? No   ?  ? Pain Assessment  ? Currently in Pain? No/denies   ? ?  ?  ? ?  ? ? ? ? ? ?Social History  ? ?Tobacco Use  ?Smoking Status Former  ? Types: Cigarettes  ? Quit date: 43  ? Years since quitting: 45.2  ?Smokeless Tobacco Never  ? ? ?Goals Met:  ?Independence with exercise equipment ?Exercise tolerated well ?No report of concerns or symptoms today ? ?Goals Unmet:  ?Not Applicable ? ?Comments: Pt able to follow exercise prescription today without complaint.  Will continue to monitor for progression. ? ? ? ?Dr. Emily Filbert is Medical Director for [place].  ?Dr. Ottie Glazier is Medical Director for [facility] Pulmonary Rehabilitation. ?

## 2021-08-12 ENCOUNTER — Encounter: Payer: BC Managed Care – PPO | Admitting: *Deleted

## 2021-08-12 DIAGNOSIS — Z48812 Encounter for surgical aftercare following surgery on the circulatory system: Secondary | ICD-10-CM | POA: Diagnosis not present

## 2021-08-12 DIAGNOSIS — Z951 Presence of aortocoronary bypass graft: Secondary | ICD-10-CM

## 2021-08-12 NOTE — Progress Notes (Signed)
Daily Session Note ? ?Patient Details  ?Name: Susan Weeks ?MRN: [id number] ?Date of Birth: 03/01/1951 ?Referring Provider:   ?Flowsheet Row Cardiac Rehab from 07/23/2021 in [facility] Cardiac and Pulmonary Rehab  ?Referring Provider [name] MD  ? ?  ? ? ?Encounter Date: 08/12/2021 ? ?Check In: ? Session Check In - 08/12/21 0839   ? ?  ? Check-In  ? Supervising physician immediately available to respond to emergencies See telemetry face sheet for immediately available ER MD   ? Location [facility]-Cardiac & Pulmonary Rehab   ? Staff Present Heath Lark, RN, BSN, CCRP;Laureen Owens Shark, BS, RRT, CPFT;Kelly [place], BS, ACSM CEP, Exercise Physiologist;Joseph [place], [place]   ? Virtual Visit No   ? Medication changes reported     No   ? Fall or balance concerns reported    No   ? Warm-up and Cool-down Performed on first and last piece of equipment   ? Resistance Training Performed Yes   ? VAD Patient? No   ? PAD/SET Patient? No   ?  ? Pain Assessment  ? Currently in Pain? No/denies   ? ?  ?  ? ?  ? ? ? ? ? ?Social History  ? ?Tobacco Use  ?Smoking Status Former  ? Types: Cigarettes  ? Quit date: 21  ? Years since quitting: 45.3  ?Smokeless Tobacco Never  ? ? ?Goals Met:  ?Independence with exercise equipment ?Exercise tolerated well ?No report of concerns or symptoms today ? ?Goals Unmet:  ?Not Applicable ? ?Comments: Pt able to follow exercise prescription today without complaint.  Will continue to monitor for progression. ? ? ? ?Dr. Emily Filbert is Medical Director for [place].  ?Dr. Ottie Glazier is Medical Director for [facility] Pulmonary Rehabilitation. ?

## 2021-08-14 ENCOUNTER — Encounter: Payer: BC Managed Care – PPO | Admitting: *Deleted

## 2021-08-14 DIAGNOSIS — Z951 Presence of aortocoronary bypass graft: Secondary | ICD-10-CM

## 2021-08-14 DIAGNOSIS — Z48812 Encounter for surgical aftercare following surgery on the circulatory system: Secondary | ICD-10-CM | POA: Diagnosis not present

## 2021-08-14 NOTE — Progress Notes (Signed)
Daily Session Note ? ?Patient Details  ?Name: Susan Weeks ?MRN: [id number] ?Date of Birth: 1950/08/17 ?Referring Provider:   ?Flowsheet Row Cardiac Rehab from 07/23/2021 in [facility] Cardiac and Pulmonary Rehab  ?Referring Provider [name] MD  ? ?  ? ? ?Encounter Date: 08/14/2021 ? ?Check In: ? Session Check In - 08/14/21 0936   ? ?  ? Check-In  ? Supervising physician immediately available to respond to emergencies See telemetry face sheet for immediately available ER MD   ? Location [facility]-Cardiac & Pulmonary Rehab   ? Staff Present Heath Lark, RN, BSN, CCRP;Amanda Sommer, BA, ACSM CEP, Exercise Physiologist;Melissa [place], RDN, LDN;Joseph [place], RCP,RRT,BSRT   ? Virtual Visit No   ? Medication changes reported     No   ? Fall or balance concerns reported    No   ? Warm-up and Cool-down Performed on first and last piece of equipment   ? Resistance Training Performed Yes   ? VAD Patient? No   ? PAD/SET Patient? No   ?  ? Pain Assessment  ? Currently in Pain? No/denies   ? ?  ?  ? ?  ? ? ? ? ? ?Social History  ? ?Tobacco Use  ?Smoking Status Former  ? Types: Cigarettes  ? Quit date: 25  ? Years since quitting: 45.3  ?Smokeless Tobacco Never  ? ? ?Goals Met:  ?Independence with exercise equipment ?Exercise tolerated well ?No report of concerns or symptoms today ? ?Goals Unmet:  ?Not Applicable ? ?Comments: Pt able to follow exercise prescription today without complaint.  Will continue to monitor for progression. ? ? ? ?Dr. Emily Filbert is Medical Director for [place].  ?Dr. Ottie Glazier is Medical Director for [facility] Pulmonary Rehabilitation. ?

## 2021-08-16 ENCOUNTER — Encounter: Payer: BC Managed Care – PPO | Admitting: *Deleted

## 2021-08-16 DIAGNOSIS — Z951 Presence of aortocoronary bypass graft: Secondary | ICD-10-CM

## 2021-08-16 DIAGNOSIS — Z48812 Encounter for surgical aftercare following surgery on the circulatory system: Secondary | ICD-10-CM | POA: Diagnosis not present

## 2021-08-16 NOTE — Progress Notes (Signed)
Daily Session Note ? ?Patient Details  ?Name: Susan Weeks ?MRN: [id number] ?Date of Birth: [birth year] ?Referring Provider:   ?Flowsheet Row Cardiac Rehab from 07/23/2021 in [facility] Cardiac and Pulmonary Rehab  ?Referring Provider [name] MD  ? ?  ? ? ?Encounter Date: 08/16/2021 ? ?Check In: ? Session Check In - 08/16/21 0952   ? ?  ? Check-In  ? Supervising physician immediately available to respond to emergencies See telemetry face sheet for immediately available ER MD   ? Location [facility]-Cardiac & Pulmonary Rehab   ? Staff Present Heath Lark, RN, BSN, CCRP;Joseph [place], RCP,RRT,BSRT;Melissa [place], [place], LDN   ? Virtual Visit No   ? Medication changes reported     No   ? Fall or balance concerns reported    No   ? Warm-up and Cool-down Performed on first and last piece of equipment   ? Resistance Training Performed Yes   ? VAD Patient? No   ? PAD/SET Patient? No   ?  ? Pain Assessment  ? Currently in Pain? No/denies   ? ?  ?  ? ?  ? ? ? ? ? ?Social History  ? ?Tobacco Use  ?Smoking Status Former  ? Types: Cigarettes  ? Quit date: 31  ? Years since quitting: 45.3  ?Smokeless Tobacco Never  ? ? ?Goals Met:  ?Independence with exercise equipment ?Exercise tolerated well ?No report of concerns or symptoms today ? ?Goals Unmet:  ?Not Applicable ? ?Comments: Pt able to follow exercise prescription today without complaint.  Will continue to monitor for progression. ? ? ? ?Dr. Emily Filbert is Medical Director for [place].  ?Dr. Ottie Glazier is Medical Director for [facility] Pulmonary Rehabilitation. ?

## 2021-08-19 ENCOUNTER — Encounter: Payer: BC Managed Care – PPO | Admitting: *Deleted

## 2021-08-19 DIAGNOSIS — Z951 Presence of aortocoronary bypass graft: Secondary | ICD-10-CM

## 2021-08-19 DIAGNOSIS — Z48812 Encounter for surgical aftercare following surgery on the circulatory system: Secondary | ICD-10-CM | POA: Diagnosis not present

## 2021-08-19 NOTE — Progress Notes (Signed)
Daily Session Note ? ?Patient Details  ?Name: Susan Weeks ?MRN: [id number] ?Date of Birth: 08-Feb-1951 ?Referring Provider:   ?Flowsheet Row Cardiac Rehab from 07/23/2021 in [facility] Cardiac and Pulmonary Rehab  ?Referring Provider [name] MD  ? ?  ? ? ?Encounter Date: 08/19/2021 ? ?Check In: ? Session Check In - 08/19/21 0838   ? ?  ? Check-In  ? Supervising physician immediately available to respond to emergencies See telemetry face sheet for immediately available ER MD   ? Location [facility]-Cardiac & Pulmonary Rehab   ? Staff Present Heath Lark, RN, BSN, [place], BS, ACSM CEP, Exercise Physiologist;Joseph [place], [place]   ? Virtual Visit No   ? Medication changes reported     No   ? Fall or balance concerns reported    No   ? Warm-up and Cool-down Performed on first and last piece of equipment   ? Resistance Training Performed Yes   ? VAD Patient? No   ? PAD/SET Patient? No   ?  ? Pain Assessment  ? Currently in Pain? No/denies   ? ?  ?  ? ?  ? ? ? ? ? ?Social History  ? ?Tobacco Use  ?Smoking Status Former  ? Types: Cigarettes  ? Quit date: 30  ? Years since quitting: 45.3  ?Smokeless Tobacco Never  ? ? ?Goals Met:  ?Independence with exercise equipment ?Exercise tolerated well ?No report of concerns or symptoms today ? ?Goals Unmet:  ?Not Applicable ? ?Comments: Pt able to follow exercise prescription today without complaint.  Will continue to monitor for progression. ? ? ? ?Dr. Emily Filbert is Medical Director for [place].  ?Dr. Ottie Glazier is Medical Director for [facility] Pulmonary Rehabilitation. ?

## 2021-08-21 ENCOUNTER — Encounter: Payer: Self-pay | Admitting: *Deleted

## 2021-08-21 DIAGNOSIS — Z951 Presence of aortocoronary bypass graft: Secondary | ICD-10-CM

## 2021-08-21 DIAGNOSIS — Z48812 Encounter for surgical aftercare following surgery on the circulatory system: Secondary | ICD-10-CM | POA: Diagnosis not present

## 2021-08-21 NOTE — Progress Notes (Signed)
Cardiac Individual Treatment Plan ? ?Patient Details  ?Name: Susan Weeks ?MRN: [id number] ?Date of Birth: [birth year] ?Referring Provider:   ?Flowsheet Row Cardiac Rehab from 07/23/2021 in [facility] Cardiac and Pulmonary Rehab  ?Referring Provider [name] MD  ? ?  ? ? ?Initial Encounter Date:  ?Flowsheet Row Cardiac Rehab from 07/23/2021 in [facility] Cardiac and Pulmonary Rehab  ?Date 07/23/21  ? ?  ? ? ?Visit Diagnosis: S/P CABG x 4 ? ?Patient's Home Medications on Admission: ? ?Current Outpatient Medications:  ?  acetaminophen (TYLENOL) 500 MG tablet, Take 500 mg by mouth every 6 (six) hours as needed for moderate pain., Disp: , Rfl:  ?  amLODipine (NORVASC) 5 MG tablet, TAKE 1 TABLET BY MOUTH EVERY DAY, Disp: 90 tablet, Rfl: 1 ?  aspirin EC 325 MG EC tablet, Take 1 tablet (325 mg total) by mouth daily., Disp: , Rfl:  ?  Calcium Carbonate-Vitamin D (CALCIUM 600+D PO), Take 1 tablet by mouth daily., Disp: , Rfl:  ?  metoprolol tartrate (LOPRESSOR) 25 MG tablet, Take 1 tablet (25 mg total) by mouth 2 (two) times daily., Disp: 60 tablet, Rfl: 11 ?  NON FORMULARY, Pt uses a cpap nightly, Disp: , Rfl:  ?  vitamin E 400 UNIT capsule, Take 400 Units by mouth daily. , Disp: , Rfl:  ? ?Past Medical History: ?Past Medical History:  ?Diagnosis Date  ? Abnormal liver function   ? Colon cancer ([place])   ? adenocarcinoma of the cecum s/p partial colon resection  ? Coronary artery disease   ? a. 03/2021 Cor CTA: sev LAD dzs; b. 04/2021 Cath: lM nl, LAD 124m(unable to wire), D1 60, LCX nl, OM3 60, LPAV 100, RCA 90p; c. 05/2021 CABG x 4: LIMA->LAD, VG->RPDA, VG->OM1, VG->OM3.  ? History of colon polyps   ? Hyperlipidemia   ? Hypertension   ? Post-operative Atrial Fibrillation ([place]   ? a. 05/2021 Brief PAF after CABG-->amio.  ? Sleep apnea 12/28/2017  ? Systolic murmur   ? a. 108/6761Echo: EF 60-65%, no rwma, GrI DD, RVSP 28.868mg. Mild-mod MR.  ? ? ?Tobacco Use: ?Social History  ? ?Tobacco Use  ?Smoking Status Former  ? Types:  Cigarettes  ? Quit date: 1976? Years since quitting: 45.3  ?Smokeless Tobacco Never  ? ? ?Labs: ?Review Flowsheet   ? ?  ?  Latest Ref Rng & Units 11/30/2020 05/03/2021 05/07/2021 05/08/2021  ?Labs for ITP Cardiac and Pulmonary Rehab  ?Cholestrol 0 - 200 mg/dL 147       ?LDL (calc) 0 - 99 mg/dL 70       ?Direct LDL 0 - 99 mg/dL      ?HDL-C >40 mg/dL 61       ?Trlycerides <150 mg/dL 79       ?Hemoglobin A1c 4.8 - 5.6 %  5.8      ?PH, Arterial 7.350 - 7.450  7.410   7.370    ? 7.320    ? 7.290    ? 7.466    ? 7.531    ? 7.535    ? 7.530   7.382    ?PCO2 arterial 32.0 - 48.0 mmHg  41.9   37.1    ? 35.6    ? 45.6    ? 37.9    ? 31.2    ? 31.3    ? 32.7   22.9    ?Bicarbonate 20.0 - 28.0 mmol/L  26.0   21.3    ?  18.3    ? 22.0    ? 27.3    ? 26.1    ? 26.5    ? 22.8    ? 27.3   13.5    ?TCO2 22 - 32 mmol/L   22    ? 19    ? 23    ? 25    ? 28    ? 27    ? 27    ? 26    ? 24    ? 28    ? 24    ? 26   14    ?Acid-base deficit 0.0 - 2.0 mmol/L   3.0    ? 7.0    ? 5.0    ? 1.0   10.0    ?O2 Saturation %  97.9   98.0    ? 98.0    ? 98.0    ? 100.0    ? 100.0    ? 100.0    ? 73.0    ? 100.0   99.0    ? ?  07/18/2021  ?Labs for ITP Cardiac and Pulmonary Rehab  ?Cholestrol   ?LDL (calc)   ?Direct LDL 53.0    ?HDL-C   ?Trlycerides   ?Hemoglobin A1c   ?PH, Arterial   ?PCO2 arterial   ?Bicarbonate   ?TCO2   ?Acid-base deficit   ?O2 Saturation   ?  ? ? Multiple values from one day are sorted in reverse-chronological order  ?  ?  ? ? ? ?Exercise Target Goals: ?Exercise Program Goal: ?Individual exercise prescription set using results from initial 6 min walk test and THRR while considering  patient?s activity barriers and safety.  ? ?Exercise Prescription Goal: ?Initial exercise prescription builds to 30-45 minutes a day of aerobic activity, 2-3 days per week.  Home exercise guidelines will be given to patient during program as part of exercise prescription that the participant will acknowledge. ? ? ?Education: Aerobic Exercise: ?- Group  verbal and visual presentation on the components of exercise prescription. Introduces F.I.T.T principle from ACSM for exercise prescriptions.  Reviews F.I.T.T. principles of aerobic exercise including progression. Written material given at graduation. ? ? ?Education: Resistance Exercise: ?- Group verbal and visual presentation on the components of exercise prescription. Introduces F.I.T.T principle from ACSM for exercise prescriptions  Reviews F.I.T.T. principles of resistance exercise including progression. Written material given at graduation. ? ?  ?Education: Exercise & Equipment Safety: ?- Individual verbal instruction and demonstration of equipment use and safety with use of the equipment. ?Flowsheet Row Cardiac Rehab from 08/21/2021 in [facility] Cardiac and Pulmonary Rehab  ?Education need identified 07/23/21  ?Date 07/23/21  ?Educator KL1  ?Instruction Review Code 5- Refused Teaching  ? ?  ? ? ?Education: Exercise Physiology & General Exercise Guidelines: ?- Group verbal and written instruction with models to review the exercise physiology of the cardiovascular system and associated critical values. Provides general exercise guidelines with specific guidelines to those with heart or lung disease.  ? ? ?Education: Flexibility, Balance, Mind/Body Relaxation: ?- Group verbal and visual presentation with interactive activity on the components of exercise prescription. Introduces F.I.T.T principle from ACSM for exercise prescriptions. Reviews F.I.T.T. principles of flexibility and balance exercise training including progression. Also discusses the mind body connection.  Reviews various relaxation techniques to help reduce and manage stress (i.e. Deep breathing, progressive muscle relaxation, and visualization). Balance handout provided to take home. Written material given at graduation. ? ? ?Activity Barriers & Risk  Stratification: ? Activity Barriers & Cardiac Risk Stratification - 07/23/21 1137   ? ?  ? Activity  Barriers & Cardiac Risk Stratification  ? Activity Barriers Incisional Pain;Deconditioning;Muscular Weakness   ? Cardiac Risk Stratification High   ? ?  ?  ? ?  ? ? ?6 Minute Walk: ? 6 Minute Walk   ? ? [place] Name 07/23/21 1138  ?  ?  ?  ? 6 Minute Walk  ? Phase Initial    ? Distance 1065 feet    ? Walk Time 6 minutes    ? # of Rest Breaks 0    ? MPH 2.01    ? METS 2.23    ? RPE 11    ? Perceived Dyspnea  1    ? VO2 Peak 7.81    ? Symptoms No    ? Resting HR 75 bpm    ? Resting BP 126/76    ? Resting Oxygen Saturation  99 %    ? Exercise Oxygen Saturation  during 6 min walk 95 %    ? Max Ex. HR 90 bpm    ? Max Ex. BP 144/76    ? 2 Minute Post BP 124/74    ? ?  ?  ? ?  ? ? ?Oxygen Initial Assessment: ? ? ?Oxygen Re-Evaluation: ? ? ?Oxygen Discharge (Final Oxygen Re-Evaluation): ? ? ?Initial Exercise Prescription: ? Initial Exercise Prescription - 07/23/21 1100   ? ?  ? Date of Initial Exercise RX and Referring Provider  ? Date 07/23/21   ? Referring Provider [name] MD   ?  ? Treadmill  ? MPH 1.8   ? Grade 0   ? Minutes 15   ? METs 2.38   ?  ? Recumbant Elliptical  ? Level 1   ? RPM 50   ? Minutes 15   ? METs 2.2   ?  ? REL-XR  ? Level 1   ? Speed 50   ? Minutes 15   ? METs 2.2   ?  ? Track  ? Laps 26   ? Minutes 15   ? METs 2.41   ?  ? Prescription Details  ? Frequency (times per week) 3   ? Duration Progress to 30 minutes of continuous aerobic without signs/symptoms of physical distress   ?  ? Intensity  ? THRR 40-80% of Max Heartrate 104-134   ? Ratings of Perceived Exertion 11-13   ? Perceived Dyspnea 0-4   ?  ? Progression  ? Progression Continue to progress workloads to maintain intensity without signs/symptoms of physical distress.   ?  ? Resistance Training  ? Training Prescription Yes   ? Weight 3 lb   ? Reps 10-15   ? ?  ?  ? ?  ? ? ?Perform Capillary Blood Glucose checks as needed. ? ?Exercise Prescription Changes: ? ? Exercise Prescription Changes   ? ? [place] Name 07/23/21 1100 08/05/21 1800 08/19/21  1400  ?  ?  ?  ? Response to Exercise  ? Blood Pressure (Admit) 126/76 138/78 128/84    ? Blood Pressure (Exercise) 144/76 148/78 --    ? Blood Pressure (Exit) 124/74 122/74 110/64    ? Heart Rate (Admit) 75 bpm 7

## 2021-08-21 NOTE — Progress Notes (Signed)
Daily Session Note ? ?Patient Details  ?Name: Susan Weeks ?MRN: [id number] ?Date of Birth: 1951/01/24 ?Referring Provider:   ?Flowsheet Row Cardiac Rehab from 07/23/2021 in [facility] Cardiac and Pulmonary Rehab  ?Referring Provider [name] MD  ? ?  ? ? ?Encounter Date: 08/21/2021 ? ?Check In: ? Session Check In - 08/21/21 0754   ? ?  ? Check-In  ? Supervising physician immediately available to respond to emergencies See telemetry face sheet for immediately available ER MD   ? Location [facility]-Cardiac & Pulmonary Rehab   ? Staff Present Birdie Sons, MPA, RN;Amanda Sommer, BA, ACSM CEP, Exercise Physiologist;Joseph [place], [place]   ? Virtual Visit No   ? Medication changes reported     No   ? Fall or balance concerns reported    No   ? Warm-up and Cool-down Performed on first and last piece of equipment   ? Resistance Training Performed Yes   ? VAD Patient? No   ? PAD/SET Patient? No   ?  ? Pain Assessment  ? Currently in Pain? No/denies   ? ?  ?  ? ?  ? ? ? ? ? ?Social History  ? ?Tobacco Use  ?Smoking Status Former  ? Types: Cigarettes  ? Quit date: 67  ? Years since quitting: 45.3  ?Smokeless Tobacco Never  ? ? ?Goals Met:  ?Independence with exercise equipment ?Exercise tolerated well ?No report of concerns or symptoms today ?Strength training completed today ? ?Goals Unmet:  ?Not Applicable ? ?Comments: Pt able to follow exercise prescription today without complaint.  Will continue to monitor for progression. ? ? ? ?Dr. Emily Filbert is Medical Director for [place].  ?Dr. Ottie Glazier is Medical Director for [facility] Pulmonary Rehabilitation. ?

## 2021-08-22 ENCOUNTER — Ambulatory Visit (INDEPENDENT_AMBULATORY_CARE_PROVIDER_SITE_OTHER): Payer: BC Managed Care – PPO | Admitting: Pulmonary Disease

## 2021-08-22 ENCOUNTER — Encounter: Payer: Self-pay | Admitting: Pulmonary Disease

## 2021-08-22 VITALS — BP 122/70 | HR 75 | Temp 98.1°F | Ht 63.0 in | Wt 174.2 lb

## 2021-08-22 DIAGNOSIS — I2510 Atherosclerotic heart disease of native coronary artery without angina pectoris: Secondary | ICD-10-CM

## 2021-08-22 DIAGNOSIS — G4733 Obstructive sleep apnea (adult) (pediatric): Secondary | ICD-10-CM

## 2021-08-22 DIAGNOSIS — J986 Disorders of diaphragm: Secondary | ICD-10-CM

## 2021-08-22 NOTE — Progress Notes (Signed)
? ?[facility] ? ?Chief Complaint  ?Patient presents with  ? Follow-up  ?  Wearing cpap avg 6hr nightly- feels pressure is okay. Mask causes her to sweat  ? ? ?Constitutional:  ?BP 122/70 (BP Location: Left Arm, Cuff Size: Normal)   Pulse 75   Temp 98.1 ?F (36.7 ?C) (Temporal)   Ht '5\' 3"'$  (1.6 m)   Wt 174 lb 3.2 oz (79 kg)   LMP 06/28/1982   SpO2 98%   BMI 30.86 kg/m?  ? ?Past Medical History:  ?HTN, HLD, Colon cancer, CAD s/p CABG ? ?Past Surgical History:  ?Her  has a past surgical history that includes Appendectomy; Abdominal hysterectomy (1984); Colonoscopy with propofol (N/A, 07/28/2016); Colonoscopy w/ polypectomy (2001); LEFT HEART CATH AND CORONARY ANGIOGRAPHY (N/A, 04/15/2021); CORONARY STENT INTERVENTION (N/A, 04/15/2021); Dilation and curettage of uterus (1967); IR THORACENTESIS ASP PLEURAL SPACE W/IMG GUIDE (05/10/2021); Coronary artery bypass graft (N/A, 05/07/2021); TEE without cardioversion (N/A, 05/07/2021); and Endoharvest vein of greater saphenous vein (Left, 05/07/2021). ? ?Brief Summary:  ?Susan Weeks is a 71 y.o. female with obstructive sleep apnea. ?  ? ? ? ?Subjective:  ? ?She started exercising last Fall.  She noticed feeling tired and short of breath.  Her sister prompted her to have this assessed.  Her PCP referred her to cardiology, and she was found to have significant coronary artery disease.  She had CABG in January.  She had persistent Lt pleural effusion after surgery.  Later it was determined that her Lt diaphragm was paralyzed. ? ?She doesn't feel like her breathing limits her activities currently.  She does not feel short of breath laying flat or when she bends over.  Not having cough, wheeze, sputum, or chest pain. ? ?Using CPAP nightly.  She has been getting water build up in her mask.  Otherwise pressure setting is comfortable and she is sleeping well. ? ?Physical Exam:  ? ?Appearance - well kempt  ? ?ENMT - no sinus tenderness, no  oral exudate, no LAN, Mallampati 3 airway, no stridor ? ?Respiratory - equal breath sounds bilaterally, no wheezing or rales ? ?CV - s1s2 regular rate and rhythm, no murmurs ? ?Ext - no clubbing, no edema ? ?Skin - no rashes ? ?Psych - normal mood and affect ? ?  ?Chest Imaging:  ?Sniff test 07/24/21 >> paradoxical motion of Lt diaphragm with inspiration ? ?Sleep Tests:  ?HST 12/28/17 >> AHI 8.5, SpO2 low 72% ?Auto CPAP 07/22/21 to 08/20/21 >> used on 30 of 30 nights with average 8 hrs 23 min.  Average AHI 0.7 with median CPAP 9 and 95 th percentile CPAP 13 cm H2O ? ?Cardiac Tests:  ?Echo 04/19/21 >> EF 60 to 65%, grade 1 DD, RVSP 28.8 mmHg, mild/mod MR ? ? ?Social History:  ?She  reports that she quit smoking about 45 years ago. Her smoking use included cigarettes. She has never used smokeless tobacco. She reports that she does not drink alcohol and does not use drugs. ? ?Family History:  ?Her family history includes Cancer in her father; Heart attack in her brother; Heart disease in her brother; Hypertension in her brother, mother, and sister; Stroke in her mother. ?  ? ? ?Assessment/Plan:  ? ?Obstructive sleep apnea. ?- she is compliant with CPAP and reports benefit from therapy ?- she used [employer] for her DME ?- continue auto CPAP 5 to 15 cm H2O ?- discussed techniques to help alleviate rain out ? ?Lt diaphragm paralysis and  Lt pleural effusion after CABG in January 2023. ?- no respiratory symptoms ?- she will follow up with Dr. Kipp Brood with cardiothoracic surgery in June 2023 ? ?Secondary polycythemia. ?- followed by Dr. Earlie Server with hematology/oncology at [facility] in [place] ? ?Time Spent Involved in Patient Care on Day of Examination:  ?35 minutes ? ?Follow up:  ? ?Patient Instructions  ?Follow up in 1 year ? ?Medication List:  ? ?Allergies as of 08/22/2021   ? ?   Reactions  ? Tape   ? Bruising from paper tape  ? Lisinopril Rash  ? Developed slight facial swelling and itching over forehead while  taking. Unclear if definite ACE allergy, but concern enough to stop in ER today.  ? Z-pak [azithromycin] Nausea And Vomiting  ? Caused Emesis  ? ?  ? ?  ?Medication List  ?  ? ?  ? Accurate as of August 22, 2021  9:43 AM. If you have any questions, ask your nurse or doctor.  ?  ?  ? ?  ? ?acetaminophen 500 MG tablet ?Commonly known as: TYLENOL ?Take 500 mg by mouth every 6 (six) hours as needed for moderate pain. ?  ?amLODipine 5 MG tablet ?Commonly known as: NORVASC ?TAKE 1 TABLET BY MOUTH EVERY DAY ?  ?aspirin 325 MG EC tablet ?Take 1 tablet (325 mg total) by mouth daily. ?  ?CALCIUM 600+D PO ?Take 1 tablet by mouth daily. ?  ?metoprolol tartrate 25 MG tablet ?Commonly known as: LOPRESSOR ?Take 1 tablet (25 mg total) by mouth 2 (two) times daily. ?  ?NON FORMULARY ?Pt uses a cpap nightly ?  ?vitamin E 180 MG (400 UNITS) capsule ?Take 400 Units by mouth daily. ?  ? ?  ? ? ?Signature:  ?Chesley Mires, MD ?[place] ?Pager - [phone] - [phone] ?08/22/2021, 9:43 AM ?  ? ? ? ? ? ? ? ? ?

## 2021-08-22 NOTE — Patient Instructions (Signed)
Follow up in 1 year.

## 2021-08-23 ENCOUNTER — Encounter: Payer: BC Managed Care – PPO | Admitting: *Deleted

## 2021-08-23 DIAGNOSIS — Z48812 Encounter for surgical aftercare following surgery on the circulatory system: Secondary | ICD-10-CM | POA: Diagnosis not present

## 2021-08-23 DIAGNOSIS — Z951 Presence of aortocoronary bypass graft: Secondary | ICD-10-CM

## 2021-08-23 NOTE — Progress Notes (Signed)
Daily Session Note ? ?Patient Details  ?Name: DOHA BOLING ?MRN: [id number] ?Date of Birth: 1950/08/06 ?Referring Provider:   ?Flowsheet Row Cardiac Rehab from 07/23/2021 in [facility] Cardiac and Pulmonary Rehab  ?Referring Provider [name] MD  ? ?  ? ? ?Encounter Date: 08/23/2021 ? ?Check In: ? Session Check In - 08/23/21 0805   ? ?  ? Check-In  ? Supervising physician immediately available to respond to emergencies See telemetry face sheet for immediately available ER MD   ? Location [facility]-Cardiac & Pulmonary Rehab   ? Staff Present Heath Lark, RN, BSN, CCRP;Jessica [place], MA, RCEP, CCRP, CCET;Joseph [place], [place]   ? Virtual Visit No   ? Medication changes reported     No   ? Fall or balance concerns reported    No   ? Warm-up and Cool-down Performed on first and last piece of equipment   ? Resistance Training Performed Yes   ? VAD Patient? No   ? PAD/SET Patient? No   ?  ? Pain Assessment  ? Currently in Pain? No/denies   ? ?  ?  ? ?  ? ? ? ? ? ?Social History  ? ?Tobacco Use  ?Smoking Status Former  ? Years: 10.00  ? Types: Cigarettes  ? Quit date: 34  ? Years since quitting: 45.3  ?Smokeless Tobacco Never  ?Tobacco Comments  ? 1 pack per week--only smoked on the weekends.   ? ? ?Goals Met:  ?Independence with exercise equipment ?Exercise tolerated well ?No report of concerns or symptoms today ? ?Goals Unmet:  ?Not Applicable ? ?Comments: Pt able to follow exercise prescription today without complaint.  Will continue to monitor for progression. ? ? ? ?Dr. Emily Filbert is Medical Director for [place].  ?Dr. Ottie Glazier is Medical Director for [facility] Pulmonary Rehabilitation. ?

## 2021-08-26 ENCOUNTER — Encounter: Payer: BC Managed Care – PPO | Admitting: *Deleted

## 2021-08-26 DIAGNOSIS — Z951 Presence of aortocoronary bypass graft: Secondary | ICD-10-CM

## 2021-08-26 DIAGNOSIS — Z48812 Encounter for surgical aftercare following surgery on the circulatory system: Secondary | ICD-10-CM | POA: Diagnosis not present

## 2021-08-26 NOTE — Progress Notes (Signed)
Daily Session Note ? ?Patient Details  ?Name: LAUREEN FREDERIC ?MRN: [id number] ?Date of Birth: 03-Nov-1950 ?Referring Provider:   ?Flowsheet Row Cardiac Rehab from 07/23/2021 in [facility] Cardiac and Pulmonary Rehab  ?Referring Provider [name] MD  ? ?  ? ? ?Encounter Date: 08/26/2021 ? ?Check In: ? Session Check In - 08/26/21 0831   ? ?  ? Check-In  ? Supervising physician immediately available to respond to emergencies See telemetry face sheet for immediately available ER MD   ? Location [facility]Card[facility] & Pulmonary Rehab   ? Staff Present Heath Lark, RN, BSN, Laveda Norman, BS, ACSM CEP, Exercise Physiologist;Joseph [place], [place]   ? Virtual Visit No   ? Medication changes reported     No   ? Fall or balance concerns reported    No   ? Warm-up and Cool-down Performed on first and last piece of equipment   ? Resistance Training Performed Yes   ? VAD Patient? No   ? PAD/SET Patient? No   ?  ? Pain Assessment  ? Currently in Pain? No/denies   ? ?  ?  ? ?  ? ? ? ? ? ?Social History  ? ?Tobacco Use  ?Smoking Status Former  ? Years: 10.00  ? Types: Cigarettes  ? Quit date: 34  ? Years since quitting: 45.3  ?Smokeless Tobacco Never  ?Tobacco Comments  ? 1 pack per week--only smoked on the weekends.   ? ? ?Goals Met:  ?Independence with exercise equipment ?Exercise tolerated well ?No report of concerns or symptoms today ? ?Goals Unmet:  ?Not Applicable ? ?Comments: Pt able to follow exercise prescription today without complaint.  Will continue to monitor for progression. ? ? ? ?Dr. Emily Filbert is Medical Director for [place].  ?Dr. Ottie Glazier is Medical Director for [facility] Pulmonary Rehabilitation. ?

## 2021-08-28 DIAGNOSIS — Z951 Presence of aortocoronary bypass graft: Secondary | ICD-10-CM

## 2021-08-28 DIAGNOSIS — Z48812 Encounter for surgical aftercare following surgery on the circulatory system: Secondary | ICD-10-CM | POA: Diagnosis not present

## 2021-08-28 NOTE — Progress Notes (Signed)
Daily Session Note ? ?Patient Details  ?Name: Susan Weeks ?MRN: [id number] ?Date of Birth: Jun 27, 1950 ?Referring Provider:   ?Flowsheet Row Cardiac Rehab from 07/23/2021 in [facility] Cardiac and Pulmonary Rehab  ?Referring Provider [name] MD  ? ?  ? ? ?Encounter Date: 08/28/2021 ? ?Check In: ? Session Check In - 08/28/21 0813   ? ?  ? Check-In  ? Supervising physician immediately available to respond to emergencies See telemetry face sheet for immediately available ER MD   ? Location [facility]-Cardiac & Pulmonary Rehab   ? Staff Present Birdie Sons, MPA, RN;Joseph [place], Sharren [place], MS, ASCM CEP, Exercise Physiologist;Jessica Luan Pulling, MA, RCEP, CCRP, CCET   ? Virtual Visit No   ? Medication changes reported     No   ? Fall or balance concerns reported    No   ? Warm-up and Cool-down Performed on first and last piece of equipment   ? Resistance Training Performed Yes   ? VAD Patient? No   ? PAD/SET Patient? No   ?  ? Pain Assessment  ? Currently in Pain? No/denies   ? ?  ?  ? ?  ? ? ? ? ? ?Social History  ? ?Tobacco Use  ?Smoking Status Former  ? Years: 10.00  ? Types: Cigarettes  ? Quit date: 48  ? Years since quitting: 45.3  ?Smokeless Tobacco Never  ?Tobacco Comments  ? 1 pack per week--only smoked on the weekends.   ? ? ?Goals Met:  ?Independence with exercise equipment ?Exercise tolerated well ?No report of concerns or symptoms today ?Strength training completed today ? ?Goals Unmet:  ?Not Applicable ? ?Comments: Pt able to follow exercise prescription today without complaint.  Will continue to monitor for progression. ? ? ? ?Dr. Emily Filbert is Medical Director for [place].  ?Dr. Ottie Glazier is Medical Director for [facility] Pulmonary Rehabilitation. ?

## 2021-08-30 ENCOUNTER — Encounter: Payer: BC Managed Care – PPO | Admitting: *Deleted

## 2021-08-30 DIAGNOSIS — Z48812 Encounter for surgical aftercare following surgery on the circulatory system: Secondary | ICD-10-CM | POA: Diagnosis not present

## 2021-08-30 DIAGNOSIS — Z951 Presence of aortocoronary bypass graft: Secondary | ICD-10-CM

## 2021-08-30 NOTE — Progress Notes (Signed)
Daily Session Note ? ?Patient Details  ?Name: Susan Weeks ?MRN: [id number] ?Date of Birth: [birth year] ?Referring Provider:   ?Flowsheet Row Cardiac Rehab from 07/23/2021 in [facility] Cardiac and Pulmonary Rehab  ?Referring Provider [name] MD  ? ?  ? ? ?Encounter Date: 08/30/2021 ? ?Check In: ? Session Check In - 08/30/21 0837   ? ?  ? Check-In  ? Supervising physician immediately available to respond to emergencies See telemetry face sheet for immediately available ER MD   ? Location [facility] & Pulmonary Rehab   ? Staff Present Heath Lark, RN, BSN, CCRP;Jessica [place], MA, RCEP, CCRP, CCET;Joseph [place], [place]   ? Virtual Visit No   ? Medication changes reported     No   ? Fall or balance concerns reported    No   ? Warm-up and Cool-down Performed on first and last piece of equipment   ? Resistance Training Performed Yes   ? VAD Patient? No   ? PAD/SET Patient? No   ?  ? Pain Assessment  ? Currently in Pain? No/denies   ? ?  ?  ? ?  ? ? ? ? ? ?Social History  ? ?Tobacco Use  ?Smoking Status Former  ? Years: 10.00  ? Types: Cigarettes  ? Quit date: 46  ? Years since quitting: 45.3  ?Smokeless Tobacco Never  ?Tobacco Comments  ? 1 pack per week--only smoked on the weekends.   ? ? ?Goals Met:  ?Independence with exercise equipment ?Exercise tolerated well ?No report of concerns or symptoms today ? ?Goals Unmet:  ?Not Applicable ? ?Comments: Pt able to follow exercise prescription today without complaint.  Will continue to monitor for progression. ? ? ? ?Dr. Emily Filbert is Medical Director for [place].  ?Dr. Ottie Glazier is Medical Director for [facility] Pulmonary Rehabilitation. ?

## 2021-09-02 ENCOUNTER — Encounter: Payer: BC Managed Care – PPO | Attending: Cardiovascular Disease | Admitting: *Deleted

## 2021-09-02 DIAGNOSIS — Z951 Presence of aortocoronary bypass graft: Secondary | ICD-10-CM | POA: Diagnosis present

## 2021-09-02 DIAGNOSIS — Z48812 Encounter for surgical aftercare following surgery on the circulatory system: Secondary | ICD-10-CM | POA: Insufficient documentation

## 2021-09-02 NOTE — Progress Notes (Signed)
Daily Session Note ? ?Patient Details  ?Name: OYINDAMOLA KEY ?MRN: [id number] ?Date of Birth: Nov 04, 1950 ?Referring Provider:   ?Flowsheet Row Cardiac Rehab from 07/23/2021 in [facility] Cardiac and Pulmonary Rehab  ?Referring Provider [name] MD  ? ?  ? ? ?Encounter Date: 09/02/2021 ? ?Check In: ? Session Check In - 09/02/21 0842   ? ?  ? Check-In  ? Supervising physician immediately available to respond to emergencies See telemetry face sheet for immediately available ER MD   ? Location [facility] Rehab   ? Staff Present Heath Lark, RN, BSN, Laveda Norman, BS, ACSM CEP, Exercise Physiologist;Joseph [place], [place]   ? Virtual Visit No   ? Medication changes reported     No   ? Fall or balance concerns reported    No   ? Warm-up and Cool-down Performed on first and last piece of equipment   ? Resistance Training Performed Yes   ? VAD Patient? No   ? PAD/SET Patient? No   ?  ? Pain Assessment  ? Currently in Pain? No/denies   ? ?  ?  ? ?  ? ? ? ? Exercise Prescription Changes - 09/02/21 0800   ? ?  ? Home Exercise Plan  ? Plans to continue exercise at Home (comment)   walking, staff videos  ? Frequency Add 2 additional days to program exercise sessions.   ? Initial Home Exercises Provided 09/02/21   ? ?  ?  ? ?  ? ? ?Social History  ? ?Tobacco Use  ?Smoking Status Former  ? Years: 10.00  ? Types: Cigarettes  ? Quit date: 37  ? Years since quitting: 45.3  ?Smokeless Tobacco Never  ?Tobacco Comments  ? 1 pack per week--only smoked on the weekends.   ? ? ?Goals Met:  ?Independence with exercise equipment ?Exercise tolerated well ?No report of concerns or symptoms today ? ?Goals Unmet:  ?Not Applicable ? ?Comments: Pt able to follow exercise prescription today without complaint.  Will continue to monitor for progression. ? ? ? ?Dr. Emily Filbert is Medical Director for [place].  ?Dr. Ottie Glazier is Medical Director for [facility] Pulmonary Rehabilitation. ?

## 2021-09-04 DIAGNOSIS — Z951 Presence of aortocoronary bypass graft: Secondary | ICD-10-CM

## 2021-09-04 NOTE — Progress Notes (Signed)
Daily Session Note ? ?Patient Details  ?Name: ODESSIA ASLESON ?MRN: [id number] ?Date of Birth: [birth year] ?Referring Provider:   ?Flowsheet Row Cardiac Rehab from 07/23/2021 in [facility] Cardiac and Pulmonary Rehab  ?Referring Provider [name] MD  ? ?  ? ? ?Encounter Date: 09/04/2021 ? ?Check In: ? Session Check In - 09/04/21 0813   ? ?  ? Check-In  ? Supervising physician immediately available to respond to emergencies See telemetry face sheet for immediately available ER MD   ? Location [facility]-Cardiac & Pulmonary Rehab   ? Staff Present Birdie Sons, MPA, RN;Joseph [place], RCP,RRT,BSRT;Melissa [place], RDN, LDN   ? Virtual Visit No   ? Medication changes reported     No   ? Fall or balance concerns reported    No   ? Warm-up and Cool-down Performed on first and last piece of equipment   ? Resistance Training Performed Yes   ? VAD Patient? No   ? PAD/SET Patient? No   ?  ? Pain Assessment  ? Currently in Pain? No/denies   ? ?  ?  ? ?  ? ? ? ? ? ?Social History  ? ?Tobacco Use  ?Smoking Status Former  ? Years: 10.00  ? Types: Cigarettes  ? Quit date: 64  ? Years since quitting: 45.3  ?Smokeless Tobacco Never  ?Tobacco Comments  ? 1 pack per week--only smoked on the weekends.   ? ? ?Goals Met:  ?Independence with exercise equipment ?Exercise tolerated well ?No report of concerns or symptoms today ?Strength training completed today ? ?Goals Unmet:  ?Not Applicable ? ?Comments: Pt able to follow exercise prescription today without complaint.  Will continue to monitor for progression. ? ? ? ?Dr. Emily Filbert is Medical Director for [place].  ?Dr. Ottie Glazier is Medical Director for [facility] Pulmonary Rehabilitation. ?

## 2021-09-06 ENCOUNTER — Ambulatory Visit: Payer: BC Managed Care – PPO | Admitting: Internal Medicine

## 2021-09-06 ENCOUNTER — Encounter: Payer: BC Managed Care – PPO | Admitting: *Deleted

## 2021-09-06 DIAGNOSIS — Z951 Presence of aortocoronary bypass graft: Secondary | ICD-10-CM

## 2021-09-06 NOTE — Progress Notes (Signed)
Daily Session Note ? ?Patient Details  ?Name: Susan Weeks ?MRN: [id number] ?Date of Birth: 11/10/1950 ?Referring Provider:   ?Flowsheet Row Cardiac Rehab from 07/23/2021 in [facility] Cardiac and Pulmonary Rehab  ?Referring Provider Arida, Muhammad MD  ? ?  ? ? ?Encounter Date: 09/06/2021 ? ?Check In: ? Session Check In - 09/06/21 0836   ? ?  ? Check-In  ? Supervising physician immediately available to respond to emergencies See telemetry face sheet for immediately available ER MD   ? Location [facility]-Cardiac & Pulmonary Rehab   ? Staff Present Susanne Bice, RN, BSN, CCRP;Jessica [place], MA, RCEP, CCRP, CCET;Joseph [place], RCP,RRT,BSRT   ? Virtual Visit No   ? Medication changes reported     No   ? Fall or balance concerns reported    No   ? Warm-up and Cool-down Performed on first and last piece of equipment   ? Resistance Training Performed Yes   ? VAD Patient? No   ? PAD/SET Patient? No   ?  ? Pain Assessment  ? Currently in Pain? No/denies   ? ?  ?  ? ?  ? ? ? ? ? ?Social History  ? ?Tobacco Use  ?Smoking Status Former  ? Years: 10.00  ? Types: Cigarettes  ? Quit date: 1978  ? Years since quitting: 45.3  ?Smokeless Tobacco Never  ?Tobacco Comments  ? 1 pack per week--only smoked on the weekends.   ? ? ?Goals Met:  ?Independence with exercise equipment ?Exercise tolerated well ?No report of concerns or symptoms today ? ?Goals Unmet:  ?Not Applicable ? ?Comments: Pt able to follow exercise prescription today without complaint.  Will continue to monitor for progression. ? ? ? ?Dr. Mark Miller is Medical Director for [facility] Cardiac Rehabilitation.  ?Dr. Fuad Aleskerov is Medical Director for [facility] Pulmonary Rehabilitation. ?

## 2021-09-09 ENCOUNTER — Encounter: Payer: BC Managed Care – PPO | Admitting: *Deleted

## 2021-09-09 DIAGNOSIS — Z951 Presence of aortocoronary bypass graft: Secondary | ICD-10-CM

## 2021-09-09 NOTE — Progress Notes (Signed)
Daily Session Note ? ?Patient Details  ?Name: Susan Weeks ?MRN: [id number] ?Date of Birth: 07-09-1950 ?Referring Provider:   ?Flowsheet Row Cardiac Rehab from 07/23/2021 in [facility] Cardiac and Pulmonary Rehab  ?Referring Provider [name] MD  ? ?  ? ? ?Encounter Date: 09/09/2021 ? ?Check In: ? Session Check In - 09/09/21 0837   ? ?  ? Check-In  ? Supervising physician immediately available to respond to emergencies See telemetry face sheet for immediately available ER MD   ? Location [facility]-Cardiac & Pulmonary Rehab   ? Staff Present Heath Lark, RN, BSN, CCRP;Melissa [place], RDN, Wilhelmina Mcardle, BS, ACSM CEP, Exercise Physiologist   ? Virtual Visit No   ? Medication changes reported     No   ? Fall or balance concerns reported    No   ? Warm-up and Cool-down Performed on first and last piece of equipment   ? Resistance Training Performed Yes   ? VAD Patient? No   ? PAD/SET Patient? No   ?  ? Pain Assessment  ? Currently in Pain? No/denies   ? ?  ?  ? ?  ? ? ? ? ? ?Social History  ? ?Tobacco Use  ?Smoking Status Former  ? Years: 10.00  ? Types: Cigarettes  ? Quit date: 31  ? Years since quitting: 45.3  ?Smokeless Tobacco Never  ?Tobacco Comments  ? 1 pack per week--only smoked on the weekends.   ? ? ?Goals Met:  ?Independence with exercise equipment ?Exercise tolerated well ?No report of concerns or symptoms today ? ?Goals Unmet:  ?Not Applicable ? ?Comments: Pt able to follow exercise prescription today without complaint.  Will continue to monitor for progression. ? ? ? ?Dr. Emily Filbert is Medical Director for [place].  ?Dr. Ottie Glazier is Medical Director for [facility] Pulmonary Rehabilitation. ?

## 2021-09-11 DIAGNOSIS — Z951 Presence of aortocoronary bypass graft: Secondary | ICD-10-CM

## 2021-09-11 NOTE — Progress Notes (Signed)
Daily Session Note ? ?Patient Details  ?Name: Susan Weeks ?MRN: [id number] ?Date of Birth: 12-06-1950 ?Referring Provider:   ?Flowsheet Row Cardiac Rehab from 07/23/2021 in [facility] Cardiac and Pulmonary Rehab  ?Referring Provider [name] MD  ? ?  ? ? ?Encounter Date: 09/11/2021 ? ?Check In: ? Session Check In - 09/11/21 0759   ? ?  ? Check-In  ? Supervising physician immediately available to respond to emergencies See telemetry face sheet for immediately available ER MD   ? Location [facility]-Cardiac & Pulmonary Rehab   ? Staff Present Birdie Sons, MPA, RN;Melissa [place], RDN, LDN;Joseph [place], RCP,RRT,BSRT   ? Virtual Visit No   ? Medication changes reported     No   ? Fall or balance concerns reported    No   ? Warm-up and Cool-down Performed on first and last piece of equipment   ? Resistance Training Performed Yes   ? VAD Patient? No   ? PAD/SET Patient? No   ?  ? Pain Assessment  ? Currently in Pain? No/denies   ? ?  ?  ? ?  ? ? ? ? ? ?Social History  ? ?Tobacco Use  ?Smoking Status Former  ? Years: 10.00  ? Types: Cigarettes  ? Quit date: 53  ? Years since quitting: 45.3  ?Smokeless Tobacco Never  ?Tobacco Comments  ? 1 pack per week--only smoked on the weekends.   ? ? ?Goals Met:  ?Independence with exercise equipment ?Exercise tolerated well ?No report of concerns or symptoms today ?Strength training completed today ? ?Goals Unmet:  ?Not Applicable ? ?Comments: Pt able to follow exercise prescription today without complaint.  Will continue to monitor for progression. ? ? ? ?Dr. Emily Filbert is Medical Director for [place].  ?Dr. Ottie Glazier is Medical Director for [facility] Pulmonary Rehabilitation. ?

## 2021-09-13 ENCOUNTER — Encounter: Payer: BC Managed Care – PPO | Admitting: *Deleted

## 2021-09-13 DIAGNOSIS — Z951 Presence of aortocoronary bypass graft: Secondary | ICD-10-CM | POA: Diagnosis not present

## 2021-09-13 NOTE — Progress Notes (Signed)
Daily Session Note ? ?Patient Details  ?Name: Susan Weeks ?MRN: [id number] ?Date of Birth: [birth year] ?Referring Provider:   ?Flowsheet Row Cardiac Rehab from 07/23/2021 in [facility] Cardiac and Pulmonary Rehab  ?Referring Provider [name] MD  ? ?  ? ? ?Encounter Date: 09/13/2021 ? ?Check In: ? Session Check In - 09/13/21 0924   ? ?  ? Check-In  ? Supervising physician immediately available to respond to emergencies See telemetry face sheet for immediately available ER MD   ? Location [facility]-Cardiac & Pulmonary Rehab   ? Staff Present Heath Lark, RN, BSN, CCRP;Laureen Owens [place]ark, BS, RRT, CPFT;Joseph [place], [place]   ? Virtual Visit No   ? Medication changes reported     No   ? Fall or balance concerns reported    No   ? Warm-up and Cool-down Performed on first and last piece of equipment   ? Resistance Training Performed Yes   ? VAD Patient? No   ? PAD/SET Patient? No   ?  ? Pain Assessment  ? Currently in Pain? No/denies   ? ?  ?  ? ?  ? ? ? ? ? ?Social History  ? ?Tobacco Use  ?Smoking Status Former  ? Years: 10.00  ? Types: Cigarettes  ? Quit date: 74  ? Years since quitting: 45.3  ?Smokeless Tobacco Never  ?Tobacco Comments  ? 1 pack per week--only smoked on the weekends.   ? ? ?Goals Met:  ?Independence with exercise equipment ?Exercise tolerated well ?No report of concerns or symptoms today ? ?Goals Unmet:  ?Not Applicable ? ?Comments: Pt able to follow exercise prescription today without complaint.  Will continue to monitor for progression. ? ? ? ?Dr. Emily Filbert is Medical Director for [place].  ?Dr. Ottie Glazier is Medical Director for [facility] Pulmonary Rehabilitation. ?

## 2021-09-16 ENCOUNTER — Encounter: Payer: BC Managed Care – PPO | Admitting: *Deleted

## 2021-09-16 DIAGNOSIS — Z951 Presence of aortocoronary bypass graft: Secondary | ICD-10-CM

## 2021-09-16 NOTE — Progress Notes (Signed)
Daily Session Note ? ?Patient Details  ?Name: Susan Weeks ?MRN: [id number] ?Date of Birth: 07-Apr-1951 ?Referring Provider:   ?Flowsheet Row Cardiac Rehab from 07/23/2021 in [facility] Cardiac and Pulmonary Rehab  ?Referring Provider [name] MD  ? ?  ? ? ?Encounter Date: 09/16/2021 ? ?Check In: ? Session Check In - 09/16/21 0941   ? ?  ? Check-In  ? Supervising physician immediately available to respond to emergencies See telemetry face sheet for immediately available ER MD   ? Location [facility]-Cardiac & Pulmonary Rehab   ? Staff Present Heath Lark, RN, BSN, Laveda Norman, BS, ACSM CEP, Exercise Physiologist;Joseph [place], [place]   ? Virtual Visit No   ? Medication changes reported     No   ? Fall or balance concerns reported    No   ? Warm-up and Cool-down Performed on first and last piece of equipment   ? Resistance Training Performed Yes   ? VAD Patient? No   ? PAD/SET Patient? No   ?  ? Pain Assessment  ? Currently in Pain? No/denies   ? ?  ?  ? ?  ? ? ? ? Exercise Prescription Changes - 09/16/21 0900   ? ?  ? Response to Exercise  ? Blood Pressure (Admit) 132/76   ? Blood Pressure (Exit) 118/60   ? Heart Rate (Admit) 66 bpm   ? Heart Rate (Exercise) 93 bpm   ? Heart Rate (Exit) 77 bpm   ? Rating of Perceived Exertion (Exercise) 13   ? Symptoms none   ? Duration Continue with 30 min of aerobic exercise without signs/symptoms of physical distress.   ? Intensity THRR unchanged   ?  ? Progression  ? Progression Continue to progress workloads to maintain intensity without signs/symptoms of physical distress.   ? Average METs 3.88   ?  ? Resistance Training  ? Training Prescription Yes   ? Weight 3 lb   ? Reps 10-15   ?  ? Interval Training  ? Interval Training Yes   ?  ? NuStep  ? Level 4   ? Minutes 15   ? METs 5   ?  ? Biostep-RELP  ? Level 3   ? Minutes 15   ? METs 4   ?  ? Track  ? Laps 43   ? Minutes 15   ? METs 3.34   ?  ? Home Exercise Plan  ? Plans to continue exercise at Home (comment)   walking,  staff videos  ? Frequency Add 2 additional days to program exercise sessions.   ? Initial Home Exercises Provided 09/02/21   ?  ? Oxygen  ? Maintain Oxygen Saturation 88% or higher   ? ?  ?  ? ?  ? ? ?Social History  ? ?Tobacco Use  ?Smoking Status Former  ? Years: 10.00  ? Types: Cigarettes  ? Quit date: 89  ? Years since quitting: 45.3  ?Smokeless Tobacco Never  ?Tobacco Comments  ? 1 pack per week--only smoked on the weekends.   ? ? ?Goals Met:  ?Independence with exercise equipment ?Exercise tolerated well ?No report of concerns or symptoms today ? ?Goals Unmet:  ?Not Applicable ? ?Comments: Pt able to follow exercise prescription today without complaint.  Will continue to monitor for progression. ? ? ? ?Dr. Emily Filbert is Medical Director for [facility].  ?Dr. Ottie Glazier is Medical Director for [facility] Pulmonary Rehabilitation. ?

## 2021-09-17 ENCOUNTER — Ambulatory Visit: Payer: BC Managed Care – PPO | Admitting: Internal Medicine

## 2021-09-18 ENCOUNTER — Encounter: Payer: Self-pay | Admitting: *Deleted

## 2021-09-18 VITALS — Ht 64.2 in | Wt 173.9 lb

## 2021-09-18 DIAGNOSIS — Z951 Presence of aortocoronary bypass graft: Secondary | ICD-10-CM

## 2021-09-18 NOTE — Progress Notes (Signed)
Daily Session Note ? ?Patient Details  ?Name: Susan Weeks ?MRN: [id number] ?Date of Birth: 1951/02/15 ?Referring Provider:   ?Flowsheet Row Cardiac Rehab from 07/23/2021 in [facility] Cardiac and Pulmonary Rehab  ?Referring Provider [name] MD  ? ?  ? ? ?Encounter Date: 09/18/2021 ? ?Check In: ? Session Check In - 09/18/21 0743   ? ?  ? Check-In  ? Supervising physician immediately available to respond to emergencies See telemetry face sheet for immediately available ER MD   ? Location [facility]-Cardiac & Pulmonary Rehab   ? Staff Present Birdie Sons, MPA, RN;Melissa Watha, RDN, LDN;Joseph [place], RCP,RRT,BSRT   ? Virtual Visit No   ? Medication changes reported     No   ? Fall or balance concerns reported    No   ? Warm-up and Cool-down Performed on first and last piece of equipment   ? Resistance Training Performed Yes   ? VAD Patient? No   ? PAD/SET Patient? No   ?  ? Pain Assessment  ? Currently in Pain? No/denies   ? ?  ?  ? ?  ? ? ? ? ? ?Social History  ? ?Tobacco Use  ?Smoking Status Former  ? Years: 10.00  ? Types: Cigarettes  ? Quit date: 31  ? Years since quitting: 45.4  ?Smokeless Tobacco Never  ?Tobacco Comments  ? 1 pack per week--only smoked on the weekends.   ? ? ?Goals Met:  ?Independence with exercise equipment ?Exercise tolerated well ?No report of concerns or symptoms today ?Strength training completed today ? ?Goals Unmet:  ?Not Applicable ? ?Comments: Pt able to follow exercise prescription today without complaint.  Will continue to monitor for progression. ? ? 6 Minute Walk   ? ? [place] Name 07/23/21 1138 09/18/21 0800  ?  ?  ? 6 Minute Walk  ? Phase Initial Discharge   ? Distance 1065 feet 1558 feet   ? Distance % Change -- 46.3 %   ? Distance Feet Change -- 493 ft   ? Walk Time 6 minutes 6 minutes   ? # of Rest Breaks 0 0   ? MPH 2.01 2.95   ? METS 2.23 3.17   ? RPE 11 12   ? Perceived Dyspnea  1 --   ? VO2 Peak 7.81 11.1   ? Symptoms No No   ? Resting HR 75 bpm 69 bpm   ? Resting BP 126/76  120/60   ? Resting Oxygen Saturation  99 % 97 %   ? Exercise Oxygen Saturation  during 6 min walk 95 % 92 %   ? Max Ex. HR 90 bpm 94 bpm   ? Max Ex. BP 144/76 146/70   ? 2 Minute Post BP 124/74 --   ? ?  ?  ? ?  ? ? ? ? ?Dr. Emily Filbert is Medical Director for [facility].  ?Dr. Ottie Glazier is Medical Director for [facility] Pulmonary Rehabilitation. ?

## 2021-09-18 NOTE — Progress Notes (Signed)
Cardiac Individual Treatment Plan ? ?Patient Details  ?Name: Susan Weeks ?MRN: [id number] ?Date of Birth: March 19, 1951 ?Referring Provider:   ?Flowsheet Row Cardiac Rehab from 07/23/2021 in [facility] Cardiac and Pulmonary Rehab  ?Referring Provider [name] MD  ? ?  ? ? ?Initial Encounter Date:  ?Flowsheet Row Cardiac Rehab from 07/23/2021 in [facility] Cardiac and Pulmonary Rehab  ?Date 07/23/21  ? ?  ? ? ?Visit Diagnosis: S/P CABG x 4 ? ?Patient's Home Medications on Admission: ? ?Current Outpatient Medications:  ?  acetaminophen (TYLENOL) 500 MG tablet, Take 500 mg by mouth every 6 (six) hours as needed for moderate pain., Disp: , Rfl:  ?  amLODipine (NORVASC) 5 MG tablet, TAKE 1 TABLET BY MOUTH EVERY DAY, Disp: 90 tablet, Rfl: 1 ?  aspirin EC 325 MG EC tablet, Take 1 tablet (325 mg total) by mouth daily., Disp: , Rfl:  ?  Calcium Carbonate-Vitamin D (CALCIUM 600+D PO), Take 1 tablet by mouth daily., Disp: , Rfl:  ?  metoprolol tartrate (LOPRESSOR) 25 MG tablet, Take 1 tablet (25 mg total) by mouth 2 (two) times daily., Disp: 60 tablet, Rfl: 11 ?  NON FORMULARY, Pt uses a cpap nightly, Disp: , Rfl:  ?  vitamin E 400 UNIT capsule, Take 400 Units by mouth daily. , Disp: , Rfl:  ? ?Past Medical History: ?Past Medical History:  ?Diagnosis Date  ? Abnormal liver function   ? Colon cancer ([place])   ? adenocarcinoma of the cecum s/p partial colon resection  ? Coronary artery disease   ? a. 03/2021 Cor CTA: sev LAD dzs; b. 04/2021 Cath: lM nl, LAD 164m(unable to wire), D1 60, LCX nl, OM3 60, LPAV 100, RCA 90p; c. 05/2021 CABG x 4: LIMA->LAD, VG->RPDA, VG->OM1, VG->OM3.  ? History of colon polyps   ? Hyperlipidemia   ? Hypertension   ? Post-operative Atrial Fibrillation ([place]ant   ? a. 05/2021 Brief PAF after CABG-->amio.  ? Sleep apnea 12/28/2017  ? Systolic murmur   ? a. 167/3419Echo: EF 60-65%, no rwma, GrI DD, RVSP 28.82mg. Mild-mod MR.  ? ? ?Tobacco Use: ?Social History  ? ?Tobacco Use  ?Smoking Status Former  ? Years: 10.00  ?  Types: Cigarettes  ? Quit date: 1972? Years since quitting: 45.4  ?Smokeless Tobacco Never  ?Tobacco Comments  ? 1 pack per week--only smoked on the weekends.   ? ? ?Labs: ?Review Flowsheet   ? ?  ?  Latest Ref Rng & Units 11/30/2020 05/03/2021 05/07/2021 05/08/2021  ?Labs for ITP Cardiac and Pulmonary Rehab  ?Cholestrol 0 - 200 mg/dL 147       ?LDL (calc) 0 - 99 mg/dL 70       ?Direct LDL 0 - 99 mg/dL      ?HDL-C >40 mg/dL 61       ?Trlycerides <150 mg/dL 79       ?Hemoglobin A1c 4.8 - 5.6 %  5.8      ?PH, Arterial 7.350 - 7.450  7.410   7.370    ? 7.320    ? 7.290    ? 7.466    ? 7.531    ? 7.535    ? 7.530   7.382    ?PCO2 arterial 32.0 - 48.0 mmHg  41.9   37.1    ? 35.6    ? 45.6    ? 37.9    ? 31.2    ? 31.3    ? 32.7  22.9    ?Bicarbonate 20.0 - 28.0 mmol/L  26.0   21.3    ? 18.3    ? 22.0    ? 27.3    ? 26.1    ? 26.5    ? 22.8    ? 27.3   13.5    ?TCO2 22 - 32 mmol/L   22    ? 19    ? 23    ? 25    ? 28    ? 27    ? 27    ? 26    ? 24    ? 28    ? 24    ? 26   14    ?Acid-base deficit 0.0 - 2.0 mmol/L   3.0    ? 7.0    ? 5.0    ? 1.0   10.0    ?O2 Saturation %  97.9   98.0    ? 98.0    ? 98.0    ? 100.0    ? 100.0    ? 100.0    ? 73.0    ? 100.0   99.0    ? ?  07/18/2021  ?Labs for ITP Cardiac and Pulmonary Rehab  ?Cholestrol   ?LDL (calc)   ?Direct LDL 53.0    ?HDL-C   ?Trlycerides   ?Hemoglobin A1c   ?PH, Arterial   ?PCO2 arterial   ?Bicarbonate   ?TCO2   ?Acid-base deficit   ?O2 Saturation   ?  ? ? Multiple values from one day are sorted in reverse-chronological order  ?  ?  ? ? ? ?Exercise Target Goals: ?Exercise Program Goal: ?Individual exercise prescription set using results from initial 6 min walk test and THRR while considering  patient?s activity barriers and safety.  ? ?Exercise Prescription Goal: ?Initial exercise prescription builds to 30-45 minutes a day of aerobic activity, 2-3 days per week.  Home exercise guidelines will be given to patient during program as part of exercise prescription that  the participant will acknowledge. ? ? ?Education: Aerobic Exercise: ?- Group verbal and visual presentation on the components of exercise prescription. Introduces F.I.T.T principle from ACSM for exercise prescriptions.  Reviews F.I.T.T. principles of aerobic exercise including progression. Written material given at graduation. ?Flowsheet Row Cardiac Rehab from 09/18/2021 in [facility] Cardiac and Pulmonary Rehab  ?Date 09/04/21  ?Educator [facility]  ?Instruction Review Code 1- Verbalizes Understanding  ? ?  ? ? ?Education: Resistance Exercise: ?- Group verbal and visual presentation on the components of exercise prescription. Introduces F.I.T.T principle from ACSM for exercise prescriptions  Reviews F.I.T.T. principles of resistance exercise including progression. Written material given at graduation. ?Flowsheet Row Cardiac Rehab from 09/18/2021 in [facility] Cardiac and Pulmonary Rehab  ?Date 09/11/21  ?Educator [facility]  ?Instruction Review Code 1- Verbalizes Understanding  ? ?  ? ?  ?Education: Exercise & Equipment Safety: ?- Individual verbal instruction and demonstration of equipment use and safety with use of the equipment. ?Flowsheet Row Cardiac Rehab from 09/18/2021 in [facility] Cardiac and Pulmonary Rehab  ?Education need identified 07/23/21  ?Date 07/23/21  ?Educator [facility]  ?Instruction Review Code 5- Refused Teaching  ? ?  ? ? ?Education: Exercise Physiology & General Exercise Guidelines: ?- Group verbal and written instruction with models to review the exercise physiology of the cardiovascular system and associated critical values. Provides general exercise guidelines with specific guidelines to those with heart or lung disease.  ?Flowsheet Row Cardiac Rehab from 09/18/2021 in  [facility] Cardiac and Pulmonary Rehab  ?Date 08/28/21  ?Educator [facility]  ?Instruction Review Code 1- Verbalizes Understanding  ? ?  ? ? ?Education: Flexibility, Balance, Mind/Body Relaxation: ?- Group verbal and visual presentation with interactive activity on the components  of exercise prescription. Introduces F.I.T.T principle from ACSM for exercise prescriptions. Reviews F.I.T.T. principles of flexibility and balance exercise training including progression. Also discusses the mind body connection.  Reviews various relaxation techniques to help reduce and manage stress (i.e. Deep breathing, progressive muscle relaxation, and visualization). Balance handout provided to take home. Written material given at graduation. ?Flowsheet Row Cardiac Rehab from 09/18/2021 in [facility] Cardiac and Pulmonary Rehab  ?Date 09/18/21  ?Educator [facility]  ?Instruction Review Code 1- Verbalizes Understanding  ? ?  ? ? ?Activity Barriers & Risk Stratification: ? Activity Barriers & Cardiac Risk Stratification - 07/23/21 1137   ? ?  ? Activity Barriers & Cardiac Risk Stratification  ? Activity Barriers Incisional Pain;Deconditioning;Muscular Weakness   ? Cardiac Risk Stratification High   ? ?  ?  ? ?  ? ? ?6 Minute Walk: ? 6 Minute Walk   ? ? [place] Name 07/23/21 1138 09/18/21 0800  ?  ?  ? 6 Minute Walk  ? Phase Initial Discharge   ? Distance 1065 feet 1558 feet   ? Distance % Change -- 46.3 %   ? Distance Feet Change -- 493 ft   ? Walk Time 6 minutes 6 minutes   ? # of Rest Breaks 0 0   ? MPH 2.01 2.95   ? METS 2.23 3.17   ? RPE 11 12   ? Perceived Dyspnea  1 --   ? VO2 Peak 7.81 11.1   ? Symptoms No No   ? Resting HR 75 bpm 69 bpm   ? Resting BP 126/76 120/60   ? Resting Oxygen Saturation  99 % 97 %   ? Exercise Oxygen Saturation  during 6 min walk 95 % 92 %   ? Max Ex. HR 90 bpm 94 bpm   ? Max Ex. BP 144/76 146/70   ? 2 Minute Post BP 124/74 --   ? ?  ?  ? ?  ? ? ?Oxygen Initial Assessment: ? ? ?Oxygen Re-Evaluation: ? ? ?Oxygen Discharge (Final Oxygen Re-Evaluation): ? ? ?Initial Exercise Prescription: ? Initial Exercise Prescription - 07/23/21 1100   ? ?  ? Date of Initial Exercise RX and Referring Provider  ? Date 07/23/21   ? Referring Provider [name] MD   ?  ? Treadmill  ? MPH 1.8   ? Grade 0   ?  Minutes 15   ? METs 2.38   ?  ? Recumbant Elliptical  ? Level 1   ? RPM 50   ? Minutes 15   ? METs 2.2   ?  ? REL-XR  ? Level 1   ? Speed 50   ? Minutes 15   ? METs 2.2   ?  ? Track  ? Laps 26   ? Minutes 1

## 2021-09-19 NOTE — Patient Instructions (Signed)
Discharge Patient Instructions  Patient Details  Name: Susan Weeks MRN: [id number] Date of Birth: 12/01/1950 Referring Provider:  [name], MD   Number of Visits: 50  Reason for Discharge:  Patient reached a stable level of exercise. Patient independent in their exercise. Patient has met program and personal goals.  Smoking History:  Social History   Tobacco Use  Smoking Status Former   Years: 10.00   Types: Cigarettes   Quit date: 1978   Years since quitting: 45.4  Smokeless Tobacco Never  Tobacco Comments   1 pack per week--only smoked on the weekends.     Diagnosis:  S/P CABG x 4  Initial Exercise Prescription:  Initial Exercise Prescription - 07/23/21 1100       Date of Initial Exercise RX and Referring Provider   Date 07/23/21    Referring Provider [name] MD      Treadmill   MPH 1.8    Grade 0    Minutes 15    METs 2.38      Recumbant Elliptical   Level 1    RPM 50    Minutes 15    METs 2.2      REL-XR   Level 1    Speed 50    Minutes 15    METs 2.2      Track   Laps 26    Minutes 15    METs 2.41      Prescription Details   Frequency (times per week) 3    Duration Progress to 30 minutes of continuous aerobic without signs/symptoms of physical distress      Intensity   THRR 40-80% of Max Heartrate 104-134    Ratings of Perceived Exertion 11-13    Perceived Dyspnea 0-4      Progression   Progression Continue to progress workloads to maintain intensity without signs/symptoms of physical distress.      Resistance Training   Training Prescription Yes    Weight 3 lb    Reps 10-15             Discharge Exercise Prescription (Final Exercise Prescription Changes):  Exercise Prescription Changes - 09/16/21 0900       Response to Exercise   Blood Pressure (Admit) 132/76    Blood Pressure (Exit) 118/60    Heart Rate (Admit) 66 bpm    Heart Rate (Exercise) 93 bpm    Heart Rate (Exit) 77 bpm    Rating of  Perceived Exertion (Exercise) 13    Symptoms none    Duration Continue with 30 min of aerobic exercise without signs/symptoms of physical distress.    Intensity THRR unchanged      Progression   Progression Continue to progress workloads to maintain intensity without signs/symptoms of physical distress.    Average METs 3.88      Resistance Training   Training Prescription Yes    Weight 3 lb    Reps 10-15      Interval Training   Interval Training Yes      NuStep   Level 4    Minutes 15    METs 5      Biostep-RELP   Level 3    Minutes 15    METs 4      Track   Laps 43    Minutes 15    METs 3.34      Home Exercise Plan   Plans to continue exercise at Home (comment)   walking,  staff videos   Frequency Add 2 additional days to program exercise sessions.    Initial Home Exercises Provided 09/02/21      Oxygen   Maintain Oxygen Saturation 88% or higher             Functional Capacity:  6 Minute Walk     Row Name 07/23/21 1138 09/18/21 0800       6 Minute Walk   Phase Initial Discharge    Distance 1065 feet 1558 feet    Distance % Change -- 46.3 %    Distance Feet Change -- 493 ft    Walk Time 6 minutes 6 minutes    # of Rest Breaks 0 0    MPH 2.01 2.95    METS 2.23 3.17    RPE 11 12    Perceived Dyspnea  1 --    VO2 Peak 7.81 11.1    Symptoms No No    Resting HR 75 bpm 69 bpm    Resting BP 126/76 120/60    Resting Oxygen Saturation  99 % 97 %    Exercise Oxygen Saturation  during 6 min walk 95 % 92 %    Max Ex. HR 90 bpm 94 bpm    Max Ex. BP 144/76 146/70    2 Minute Post BP 124/74 --             Nutrition & Weight - Outcomes:  Pre Biometrics - 07/23/21 1137       Pre Biometrics   Height 5' 4.2" (1.631 m)    Weight 176 lb 6.4 oz (80 kg)    BMI (Calculated) 30.08    Single Leg Stand 11.97 seconds             Post Biometrics - 09/18/21 0800        Post  Biometrics   Height 5' 4.2" (1.631 m)    Weight 173 lb 14.4 oz (78.9 kg)     BMI (Calculated) 29.65    Single Leg Stand 30 seconds             Nutrition:  Nutrition Therapy & Goals - 08/05/21 0848       Nutrition Therapy   Diet Heart healthy, low Na    Drug/Food Interactions Statins/Certain Fruits    Protein (specify units) 65g    Fiber 25 grams    Whole Grain Foods 3 servings    Saturated Fats 16 max. grams    Fruits and Vegetables 8 servings/day    Sodium 2 grams      Personal Nutrition Goals   Nutrition Goal ST: add fruit to morning routine, swap to a whole grain cereal (she is considering bran flakes) LT: continue with changes made so far, eat at least 5 fruit/vegetable servings per day, limit saturated fat <16g/day    Comments 71 y.o. F admitted to cardiac rehab s/p CABG x4. PMHx includes HTN, CAD, HLD, erythrocytosis, colon cancer, sleep apnea. Relevant medications includes Ca w/ vit D, vit E. PYP Score: 65. Vegetables & Fruits 6/12. Breads, Grains & Cereals 7/12. Red & Processed Meat 10/12. Poultry 0/2. Fish & Shellfish 1/4. Beans, Nuts & Seeds 2/4. Milk & Dairy Foods 4/6. Toppings, Oils, Seasonings & Salt 15/20. Sweets, Snacks & Restaurant Food 12/14. Beverages 8/10.  Since her surgery she has been limintg bread, potatoes, sweets, and sugar sweetened beverages. B: bowl of cereal (cheerios or crispix with fat free milk) or on Sunday monring - [place] sausage and egg with  cheese. S: welches fruit snacks D: Will cook baked chicken or porkchops (usually with cream of onion soup), lima beans, green beans, delviled eggs sometimes.  Flounder and slaw at [facility] on Saturdays. [place] crusted chicken with broccoli, corn, and salad from [employer] on Sundays. Drinks: water w/ lemon. Discussed heart healthy eating. Checked to make sure MD following her supplementation of Ca-vit D and vit E - she wants to ask them to see if it is still necessary for her to take as she does not like them.      Intervention Plan   Intervention Prescribe, educate and counsel  regarding individualized specific dietary modifications aiming towards targeted core components such as weight, hypertension, lipid management, diabetes, heart failure and other comorbidities.    Expected Outcomes Short Term Goal: Understand basic principles of dietary content, such as calories, fat, sodium, cholesterol and nutrients.;Short Term Goal: A plan has been developed with personal nutrition goals set during dietitian appointment.;Long Term Goal: Adherence to prescribed nutrition plan.              Goals reviewed with patient; copy given to patient.

## 2021-09-20 ENCOUNTER — Encounter: Payer: BC Managed Care – PPO | Admitting: *Deleted

## 2021-09-20 DIAGNOSIS — Z951 Presence of aortocoronary bypass graft: Secondary | ICD-10-CM | POA: Diagnosis not present

## 2021-09-20 NOTE — Progress Notes (Signed)
Daily Session Note  Patient Details  Name: Susan Weeks MRN: [id number] Date of Birth: 11-30-1950 Referring Provider:   Flowsheet Row Cardiac Rehab from 07/23/2021 in [facility] Cardiac and Pulmonary Rehab  Referring Provider [name] MD       Encounter Date: 09/20/2021  Check In:  Session Check In - 09/20/21 0841       Check-In   Supervising physician immediately available to respond to emergencies See telemetry face sheet for immediately available ER MD    Location [facility]-Cardiac & Pulmonary Rehab    Staff Present Heath Lark, RN, BSN, CCRP;Joseph [place], RCP,RRT,BSRT;Jessica Boody, [place], [place], CCRP, CCET    Virtual Visit No    Medication changes reported     No    Fall or balance concerns reported    No    Warm-up and Cool-down Performed on first and last piece of equipment    Resistance Training Performed Yes    VAD Patient? No    PAD/SET Patient? No      Pain Assessment   Currently in Pain? No/denies                Social History   Tobacco Use  Smoking Status Former   Years: 10.00   Types: Cigarettes   Quit date: 1978   Years since quitting: 45.4  Smokeless Tobacco Never  Tobacco Comments   1 pack per week--only smoked on the weekends.     Goals Met:  Independence with exercise equipment Exercise tolerated well No report of concerns or symptoms today  Goals Unmet:  Not Applicable  Comments: Pt able to follow exercise prescription today without complaint.  Will continue to monitor for progression.    Dr. Emily Filbert is Medical Director for [facility].  Dr. Ottie Glazier is Medical Director for [facility] Pulmonary Rehabilitation.

## 2021-09-23 ENCOUNTER — Encounter: Payer: BC Managed Care – PPO | Admitting: *Deleted

## 2021-09-23 DIAGNOSIS — Z951 Presence of aortocoronary bypass graft: Secondary | ICD-10-CM

## 2021-09-23 NOTE — Progress Notes (Signed)
Daily Session Note  Patient Details  Name: Susan Weeks MRN: [id number] Date of Birth: June 22, 1950 Referring Provider:   Flowsheet Row Cardiac Rehab from 07/23/2021 in [facility] Cardiac and Pulmonary Rehab  Referring Provider [name] MD       Encounter Date: 09/23/2021  Check In:  Session Check In - 09/23/21 0920       Check-In   Supervising physician immediately available to respond to emergencies See telemetry face sheet for immediately available ER MD    Location [facility]-Cardiac & Pulmonary Rehab    Staff Present Earlean Shawl, BS, ACSM CEP, Exercise Physiologist;Joseph Sorgho, [place];Heath Lark, RN, BSN, CCRP    Virtual Visit No    Medication changes reported     No    Fall or balance concerns reported    No    Warm-up and Cool-down Performed on first and last piece of equipment    Resistance Training Performed Yes    VAD Patient? No    PAD/SET Patient? No      Pain Assessment   Currently in Pain? No/denies                Social History   Tobacco Use  Smoking Status Former   Years: 10.00   Types: Cigarettes   Quit date: 1978   Years since quitting: 45.4  Smokeless Tobacco Never  Tobacco Comments   1 pack per week--only smoked on the weekends.     Goals Met:  Independence with exercise equipment Exercise tolerated well No report of concerns or symptoms today  Goals Unmet:  Not Applicable  Comments: Pt able to follow exercise prescription today without complaint.  Will continue to monitor for progression.    Dr. Emily Filbert is Medical Director for [facility].  Dr. Ottie Glazier is Medical Director for [facility] Pulmonary Rehabilitation.

## 2021-09-24 ENCOUNTER — Ambulatory Visit (INDEPENDENT_AMBULATORY_CARE_PROVIDER_SITE_OTHER): Payer: BC Managed Care – PPO | Admitting: Internal Medicine

## 2021-09-24 ENCOUNTER — Encounter: Payer: Self-pay | Admitting: Internal Medicine

## 2021-09-24 VITALS — BP 142/90 | HR 76 | Temp 98.1°F | Resp 15 | Ht 64.0 in | Wt 171.0 lb

## 2021-09-24 DIAGNOSIS — R945 Abnormal results of liver function studies: Secondary | ICD-10-CM

## 2021-09-24 DIAGNOSIS — D72819 Decreased white blood cell count, unspecified: Secondary | ICD-10-CM

## 2021-09-24 DIAGNOSIS — Z1231 Encounter for screening mammogram for malignant neoplasm of breast: Secondary | ICD-10-CM | POA: Diagnosis not present

## 2021-09-24 DIAGNOSIS — I25119 Atherosclerotic heart disease of native coronary artery with unspecified angina pectoris: Secondary | ICD-10-CM

## 2021-09-24 DIAGNOSIS — I10 Essential (primary) hypertension: Secondary | ICD-10-CM | POA: Diagnosis not present

## 2021-09-24 DIAGNOSIS — G4730 Sleep apnea, unspecified: Secondary | ICD-10-CM

## 2021-09-24 DIAGNOSIS — R739 Hyperglycemia, unspecified: Secondary | ICD-10-CM | POA: Diagnosis not present

## 2021-09-24 DIAGNOSIS — J90 Pleural effusion, not elsewhere classified: Secondary | ICD-10-CM

## 2021-09-24 DIAGNOSIS — E7800 Pure hypercholesterolemia, unspecified: Secondary | ICD-10-CM

## 2021-09-24 DIAGNOSIS — Z85038 Personal history of other malignant neoplasm of large intestine: Secondary | ICD-10-CM

## 2021-09-24 DIAGNOSIS — D582 Other hemoglobinopathies: Secondary | ICD-10-CM

## 2021-09-24 LAB — HEPATIC FUNCTION PANEL
ALT: 119 U/L — ABNORMAL HIGH (ref 0–35)
AST: 48 U/L — ABNORMAL HIGH (ref 0–37)
Albumin: 4.3 g/dL (ref 3.5–5.2)
Alkaline Phosphatase: 130 U/L — ABNORMAL HIGH (ref 39–117)
Bilirubin, Direct: 0.2 mg/dL (ref 0.0–0.3)
Total Bilirubin: 1.2 mg/dL (ref 0.2–1.2)
Total Protein: 6.9 g/dL (ref 6.0–8.3)

## 2021-09-24 LAB — BASIC METABOLIC PANEL
BUN: 15 mg/dL (ref 6–23)
CO2: 30 mEq/L (ref 19–32)
Calcium: 9.8 mg/dL (ref 8.4–10.5)
Chloride: 102 mEq/L (ref 96–112)
Creatinine, Ser: 0.88 mg/dL (ref 0.40–1.20)
GFR: 65.91 mL/min (ref >60.00)
Glucose, Bld: 87 mg/dL (ref 70–99)
Potassium: 4.1 mEq/L (ref 3.5–5.1)
Sodium: 140 mEq/L (ref 135–145)

## 2021-09-24 LAB — HEMOGLOBIN A1C: Hgb A1c MFr Bld: 5.8 % (ref 4.6–6.5)

## 2021-09-24 LAB — TSH: TSH: 1.18 u[IU]/mL (ref 0.35–5.50)

## 2021-09-24 NOTE — Progress Notes (Signed)
[name]: RYVER ZADROZNY, female   DOB: 24-Aug-1950, 71 y.o.   MRN: [id number]   Subjective:    [name]: [name], female    DOB: April 21, 1951, 71 y.o.   MRN: [id number]   Patient here for a scheduled follow up.   Chief Complaint  Patient presents with   Follow-up    Follow up for HTN   Hypertension   .   HPI S/p CABG 05/2021.  Has been going to cardiac rehab.  Doing well.  Blood pressure doing well.  Completes rehab sessions this week.  No chest pain or sob.  No acid reflux.  No abdominal pain.  Bowels moving.  Saw CTS (Dr Kipp Brood) 07/26/21 - CXR with persistent moderate to large left sided effusion - diaphragm paralyzed. Recommended f/u in 3 months with f/u cxr.  Currently doing well.  Has adjusted her diet.  No bread.  No potatoes.     Past Medical History:  Diagnosis Date   Abnormal liver function    Colon cancer (HCC)    adenocarcinoma of the cecum s/p partial colon resection   Coronary artery disease    a. 03/2021 Cor CTA: sev LAD dzs; b. 04/2021 Cath: lM nl, LAD 169m(unable to wire), D1 60, LCX nl, OM3 60, LPAV 100, RCA 90p; c. 05/2021 CABG x 4: LIMA->LAD, VG->RPDA, VG->OM1, VG->OM3.   History of colon polyps    Hyperlipidemia    Hypertension    Post-operative Atrial Fibrillation ([place]    a. 05/2021 Brief PAF after CABG-->amio.   Sleep apnea 088/03/314  Systolic murmur    a. 194/5859Echo: EF 60-65%, no rwma, GrI DD, RVSP 28.86mg. Mild-mod MR.   Past Surgical History:  Procedure Laterality Date   ABDOMINAL HYSTERECTOMY  1984   secondary to bleeding and fibroid tumors   APPENDECTOMY     COLONOSCOPY W/ POLYPECTOMY  2001   COLONOSCOPY WITH PROPOFOL N/A 07/28/2016   Procedure: COLONOSCOPY WITH PROPOFOL;  Surgeon: MaLollie SailsMD;  [facility];  Service: Endoscopy;  Laterality: N/A;   CORONARY ARTERY BYPASS GRAFT N/A 05/07/2021   Procedure: CORONARY ARTERY BYPASS GRAFTING (CABG) X 4, USING LEFT INTERNAL MAMMARY ARTERY, LEFT LEG GREATER SAPHENOUS  VEIN HARVESTED ENDOSCOPICALLY;  Surgeon: LiLajuana MatteMD;  [facility]: Open [facility];  Laterality: N/A;   CORONARY STENT INTERVENTION N/A 04/15/2021   Procedure: CORONARY STENT INTERVENTION;  Surgeon: ArWellington HampshireMD;  [facility];  Service: Cardiovascular;  Laterality: N/A;   DILATION AND CURETTAGE OF UTERUS  1967   ENDOVEIN HARVEST OF GREATER SAPHENOUS VEIN Left 05/07/2021   Procedure: ENDOVEIN HARVEST OF GREATER SAPHENOUS VEIN;  Surgeon: LiLajuana MatteMD;  [facility]: [facility] Surgery;  Laterality: Left;   IR THORACENTESIS ASP PLEURAL SPACE W/IMG GUIDE  05/10/2021   LEFT HEART CATH AND CORONARY ANGIOGRAPHY N/A 04/15/2021   Procedure: LEFT HEART CATH AND CORONARY ANGIOGRAPHY;  Surgeon: ArWellington HampshireMD;  [facility];  Service: Cardiovascular;  Laterality: N/A;   TEE WITHOUT CARDIOVERSION N/A 05/07/2021   Procedure: TRANSESOPHAGEAL ECHOCARDIOGRAM (TEE);  Surgeon: LiLajuana MatteMD;  [facility]: [facility];  Laterality: N/A;   Family History  Problem Relation Age of Onset   Stroke Mother    Hypertension Mother    Cancer Father        Prostate    Hypertension Sister  x4   Heart attack Brother    Heart disease Brother        myocardial infarction   Hypertension Brother    Breast cancer Neg Hx    Social History   Socioeconomic History   Marital status: Married    Spouse name: Eddie   Number of children: 1   Years of education: HS   Highest education level: Not on file  Occupational History    Employer: OTHER    Comment: [employer]  Tobacco Use   Smoking status: Former    Years: 10.00    Types: Cigarettes    Quit date: 1978    Years since quitting: 45.4   Smokeless tobacco: Never   Tobacco comments:    1 pack per week--only smoked on the weekends.   Vaping Use   Vaping Use: Never used  Substance and Sexual Activity   Alcohol use: No     Alcohol/week: 0.0 standard drinks   Drug use: No   Sexual activity: Not on file  Other Topics Concern   Not on file  Social History Narrative   Not on file   Social Determinants of Health   Financial Resource Strain: Not on file  Food Insecurity: Not on file  Transportation Needs: Not on file  Physical Activity: Not on file  Stress: Not on file  Social Connections: Not on file     Review of Systems  Constitutional:  Negative for appetite change and unexpected weight change.  HENT:  Negative for congestion and sinus pressure.   Respiratory:  Negative for cough, chest tightness and shortness of breath.   Cardiovascular:  Negative for chest pain, palpitations and leg swelling.  Gastrointestinal:  Negative for abdominal pain, diarrhea, nausea and vomiting.  Genitourinary:  Negative for difficulty urinating and dysuria.  Musculoskeletal:  Negative for joint swelling and myalgias.  Skin:  Negative for color change and rash.  Neurological:  Negative for dizziness, light-headedness and headaches.  Psychiatric/Behavioral:  Negative for agitation and dysphoric mood.       Objective:     BP (!) 142/90 (BP Location: Left Arm, Patient Position: Sitting, Cuff Size: Small)   Pulse 76   Temp 98.1 F (36.7 C) (Temporal)   Resp 15   Ht 5' 4"  (1.626 m)   Wt 171 lb (77.6 kg)   LMP 06/28/1982   SpO2 98%   BMI 29.35 kg/m  Wt Readings from Last 3 Encounters:  09/24/21 171 lb (77.6 kg)  09/18/21 173 lb 14.4 oz (78.9 kg)  08/22/21 174 lb 3.2 oz (79 kg)    Physical Exam Vitals reviewed.  Constitutional:      General: She is not in acute distress.    Appearance: Normal appearance.  HENT:     Head: Normocephalic and atraumatic.     Right Ear: External ear normal.     Left Ear: External ear normal.  Eyes:     General: No scleral icterus.       Right eye: No discharge.        Left eye: No discharge.     Conjunctiva/sclera: Conjunctivae normal.  Neck:     Thyroid: No thyromegaly.   Cardiovascular:     Rate and Rhythm: Normal rate and regular rhythm.  Pulmonary:     Effort: No respiratory distress.     Breath sounds: No wheezing.     Comments: Question of diminished breath sounds - left base.   Abdominal:     General: Bowel sounds  are normal.     Palpations: Abdomen is soft.     Tenderness: There is no abdominal tenderness.  Musculoskeletal:        General: No swelling or tenderness.     Cervical back: Neck supple. No tenderness.  Lymphadenopathy:     Cervical: No cervical adenopathy.  Skin:    Findings: No erythema or rash.  Neurological:     Mental Status: She is alert.  Psychiatric:        Mood and Affect: Mood normal.        Behavior: Behavior normal.     Outpatient Encounter Medications as of 09/24/2021  Medication Sig   acetaminophen (TYLENOL) 500 MG tablet Take 500 mg by mouth every 6 (six) hours as needed for moderate pain.   amLODipine (NORVASC) 5 MG tablet TAKE 1 TABLET BY MOUTH EVERY DAY   aspirin EC 325 MG EC tablet Take 1 tablet (325 mg total) by mouth daily.   Calcium Carbonate-Vitamin D (CALCIUM 600+D PO) Take 1 tablet by mouth daily.   metoprolol tartrate (LOPRESSOR) 25 MG tablet Take 1 tablet (25 mg total) by mouth 2 (two) times daily.   NON FORMULARY Pt uses a cpap nightly   rosuvastatin (CRESTOR) 10 MG tablet Take 10 mg by mouth at bedtime.   vitamin E 400 UNIT capsule Take 400 Units by mouth daily.    [DISCONTINUED] lisinopril (ZESTRIL) 30 MG tablet TAKE 1 TABLET BY MOUTH EVERY DAY   No facility-administered encounter medications on file as of 09/24/2021.     Lab Results  Component Value Date   WBC 4.5 07/08/2021   HGB 14.5 07/08/2021   HCT 45.1 07/08/2021   PLT 267 07/08/2021   GLUCOSE 87 09/24/2021   CHOL 147 11/30/2020   TRIG 79 11/30/2020   HDL 61 11/30/2020   LDLDIRECT 53.0 07/18/2021   LDLCALC 70 11/30/2020   ALT 119 (H) 09/24/2021   AST 48 (H) 09/24/2021   NA 140 09/24/2021   K 4.1 09/24/2021   CL 102 09/24/2021    CREATININE 0.88 09/24/2021   BUN 15 09/24/2021   CO2 30 09/24/2021   TSH 1.18 09/24/2021   INR 1.6 (H) 05/07/2021   HGBA1C 5.8 09/24/2021    DG Chest 2 View  Result Date: 07/26/2021 CLINICAL DATA:  Pleural effusion follow-up. EXAM: CHEST - 2 VIEW COMPARISON:  Chest x-ray dated July 19, 2021. FINDINGS: Stable cardiomediastinal silhouette status post CABG. Unchanged elevation of the left hemidiaphragm. Unchanged small to moderate left pleural effusion with underlying atelectasis. The right lung is clear. No pneumothorax. No acute osseous abnormality. IMPRESSION: 1. Unchanged small to moderate left pleural effusion. Electronically Signed   By: Titus Dubin M.D.   On: 07/26/2021 09:25       Assessment & Plan:   Problem List Items Addressed This Visit     Abnormal liver function    Liver lesion - MRI. Seeing oncology.  Has f/u ultrasound scanning planned.  Recently on statin medication.  Increased LFTs.  Statin stopped.  Recheck liver panel today.         Coronary artery disease with angina pectoris ([place])    Doing well on amlodipine, metoprolol.  Off statin due to liver enzyme elevation. No pain.  Follow.        Relevant Medications   rosuvastatin (CRESTOR) 10 MG tablet   Elevated hemoglobin (HCC)    Followed by hematology as outlined.  Continue cpap.        Essential hypertension, benign  Had swelling on lisinopril.  On amlodipine. Blood pressure as outlined..  Follow pressures.  Follow metabolic panel. Pressures have been doing well at rehab.        Relevant Medications   rosuvastatin (CRESTOR) 10 MG tablet   Other Relevant Orders   Basic metabolic panel (Completed)   History of colon cancer    Colonoscopy 10/2019 - recommended f/u in 3 years.        Hyperglycemia - Primary    Low carb diet and exercise.  Follow met b and a1c.        Relevant Orders   HgB A1c (Completed)   Leukopenia    Follow cbc.        Pleural effusion    Followed by Dr Kipp Brood  as outlined.  Planning for f/u cxr.        Pure hypercholesterolemia    Continues on crestor.  Low cholesterol diet and exercise.  Follow lipid panel and liver function tests.   Lab Results  Component Value Date   CHOL 147 11/30/2020   HDL 61 11/30/2020   LDLCALC 70 11/30/2020   LDLDIRECT 53.0 07/18/2021   TRIG 79 11/30/2020   CHOLHDL 2.4 11/30/2020       Relevant Medications   rosuvastatin (CRESTOR) 10 MG tablet   Other Relevant Orders   TSH (Completed)   Hepatic function panel (Completed)   Sleep apnea    Continue cpap.       Other Visit Diagnoses     Encounter for screening mammogram for malignant neoplasm of breast       Relevant Orders   MM 3D SCREEN BREAST BILATERAL        [name], MD

## 2021-09-25 ENCOUNTER — Telehealth: Payer: Self-pay

## 2021-09-25 DIAGNOSIS — Z951 Presence of aortocoronary bypass graft: Secondary | ICD-10-CM

## 2021-09-25 NOTE — Progress Notes (Signed)
Discharge Report Referring Provider: Godfrey Pick graduated today from  rehab with 36 sessions completed.  Details of the patient's exercise prescription and what She needs to do in order to continue the prescription and progress were discussed with patient.  Patient was given a copy of prescription and goals.  Patient verbalized understanding.  Haeley plans to continue to exercise by walking at home and using staff videos.   [place] Name 07/23/21 1138 09/18/21 0800       6 Minute Walk   Phase Initial Discharge    Distance 1065 feet 1558 feet    Distance % Change -- 46.3 %    Distance Feet Change -- 493 ft    Walk Time 6 minutes 6 minutes    # of Rest Breaks 0 0    MPH 2.01 2.95    METS 2.23 3.17    RPE 11 12    Perceived Dyspnea  1 --    VO2 Peak 7.81 11.1    Symptoms No No    Resting HR 75 bpm 69 bpm    Resting BP 126/76 120/60    Resting Oxygen Saturation  99 % 97 %    Exercise Oxygen Saturation  during 6 min walk 95 % 92 %    Max Ex. HR 90 bpm 94 bpm    Max Ex. BP 144/76 146/70    2 Minute Post BP 124/74 --

## 2021-09-25 NOTE — Progress Notes (Signed)
Cardiac Individual Treatment Plan  Patient Details  Name: Susan Weeks MRN: [id number] Date of Birth: 05-07-1950 Referring Provider:   Flowsheet Row Cardiac Rehab from 07/23/2021 in [facility] Cardiac and Pulmonary Rehab  Referring Provider [name] MD       Initial Encounter Date:  Flowsheet Row Cardiac Rehab from 07/23/2021 in [facility] Cardiac and Pulmonary Rehab  Date 07/23/21       Visit Diagnosis: S/P CABG x 4  Patient's Home Medications on Admission:  Current Outpatient Medications:    acetaminophen (TYLENOL) 500 MG tablet, Take 500 mg by mouth every 6 (six) hours as needed for moderate pain., Disp: , Rfl:    amLODipine (NORVASC) 5 MG tablet, TAKE 1 TABLET BY MOUTH EVERY DAY, Disp: 90 tablet, Rfl: 1   aspirin EC 325 MG EC tablet, Take 1 tablet (325 mg total) by mouth daily., Disp: , Rfl:    Calcium Carbonate-Vitamin D (CALCIUM 600+D PO), Take 1 tablet by mouth daily., Disp: , Rfl:    metoprolol tartrate (LOPRESSOR) 25 MG tablet, Take 1 tablet (25 mg total) by mouth 2 (two) times daily., Disp: 60 tablet, Rfl: 11   NON FORMULARY, Pt uses a cpap nightly, Disp: , Rfl:    rosuvastatin (CRESTOR) 10 MG tablet, Take 10 mg by mouth at bedtime., Disp: , Rfl:    vitamin E 400 UNIT capsule, Take 400 Units by mouth daily. , Disp: , Rfl:   Past Medical History: Past Medical History:  Diagnosis Date   Abnormal liver function    Colon cancer ([place])    adenocarcinoma of the cecum s/p partial colon resection   Coronary artery disease    a. 03/2021 Cor CTA: sev LAD dzs; b. 04/2021 Cath: lM nl, LAD 186m(unable to wire), D1 60, LCX nl, OM3 60, LPAV 100, RCA 90p; c. 05/2021 CABG x 4: LIMA->LAD, VG->RPDA, VG->OM1, VG->OM3.   History of colon polyps    Hyperlipidemia    Hypertension    Post-[place]rial Fibrillation ([place]    a. 05/2021 Brief PAF after CABG-->amio.   Sleep apnea 027/78/2423  Systolic murmur    a. 153/6144Echo: EF 60-65%, no rwma, GrI DD, RVSP 28.834mg. Mild-mod MR.     Tobacco Use: Social History   Tobacco Use  Smoking Status Former   Years: 10.00   Types: Cigarettes   Quit date: 1978   Years since quitting: 45.4  Smokeless Tobacco Never  Tobacco Comments   1 pack per week--only smoked on the weekends.     Labs: Review Flowsheet        Latest Ref Rng & Units 05/03/2021 05/07/2021 05/08/2021 07/18/2021  Labs for ITP Cardiac and Pulmonary Rehab  Direct LDL 0 - 99 mg/dL    53.0    Hemoglobin A1c 4.6 - 6.5 % 5.8       PH, Arterial 7.350 - 7.450 7.410   7.370     7.320     7.290     7.466     7.531     7.535     7.530   7.382     PCO2 arterial 32.0 - 48.0 mmHg 41.9   37.1     35.6     45.6     37.9     31.2     31.3     32.7   22.9     Bicarbonate 20.0 - 28.0 mmol/L 26.0   21.3     18.3     22.0  27.3     26.1     26.5     22.8     27.3   13.5     TCO2 22 - 32 mmol/L  22     19     23     25     28     27     27     26     24     28     24     26   14      Acid-base deficit 0.0 - 2.0 mmol/L  3.0     7.0     5.0     1.0   10.0     O2 Saturation % 97.9   98.0     98.0     98.0     100.0     100.0     100.0     73.0     100.0   99.0        09/24/2021  Labs for ITP Cardiac and Pulmonary Rehab  Direct LDL   Hemoglobin A1c 5.8    PH, Arterial   PCO2 arterial   Bicarbonate   TCO2   Acid-base deficit   O2 Saturation       Multiple values from one day are sorted in reverse-chronological order         Exercise Target Goals: Exercise Program Goal: Individual exercise prescription set using results from initial 6 min walk test and THRR while considering  patient's activity barriers and safety.   Exercise Prescription Goal: Initial exercise prescription builds to 30-45 minutes a day of aerobic activity, 2-3 days per week.  Home exercise guidelines will be given to patient during program as part of exercise prescription that the participant will acknowledge.   Education: Aerobic Exercise: - Group verbal  and visual presentation on the components of exercise prescription. Introduces F.I.T.T principle from ACSM for exercise prescriptions.  Reviews F.I.T.T. principles of aerobic exercise including progression. Written material given at graduation. Flowsheet Row Cardiac Rehab from 09/25/2021 in [facility] Cardiac and Pulmonary Rehab  Date 09/04/21  Educator [facility]  Instruction Review Code 1- Verbalizes Understanding       Education: Resistance Exercise: - Group verbal and visual presentation on the components of exercise prescription. Introduces F.I.T.T principle from ACSM for exercise prescriptions  Reviews F.I.T.T. principles of resistance exercise including progression. Written material given at graduation. Flowsheet Row Cardiac Rehab from 09/25/2021 in [facility] Cardiac and Pulmonary Rehab  Date 09/11/21  Educator [facility]  Instruction Review Code 1- Verbalizes Understanding        Education: Exercise & Equipment Safety: - Individual verbal instruction and demonstration of equipment use and safety with use of the equipment. Flowsheet Row Cardiac Rehab from 09/25/2021 in [facility] Cardiac and Pulmonary Rehab  Education need identified 07/23/21  Date 07/23/21  Educator KL1  Instruction Review Code 5- Refused Teaching       Education: Exercise Physiology & General Exercise Guidelines: - Group verbal and written instruction with models to review the exercise physiology of the cardiovascular system and associated critical values. Provides general exercise guidelines with specific guidelines to those with heart or lung disease.  Flowsheet Row Cardiac Rehab from 09/25/2021 in [facility] Cardiac and Pulmonary Rehab  Date 08/28/21  Educator [facility]  Instruction Review Code 1- Verbalizes Understanding       Education: Flexibility, Balance, Mind/Body Relaxation: - Group verbal and visual presentation with interactive activity on the  components of exercise prescription. Introduces F.I.T.T principle from ACSM for exercise  prescriptions. Reviews F.I.T.T. principles of flexibility and balance exercise training including progression. Also discusses the mind body connection.  Reviews various relaxation techniques to help reduce and manage stress (i.e. Deep breathing, progressive muscle relaxation, and visualization). Balance handout provided to take home. Written material given at graduation. Flowsheet Row Cardiac Rehab from 09/25/2021 in [facility] Cardiac and Pulmonary Rehab  Date 09/18/21  Educator [facility]  Instruction Review Code 1- Verbalizes Understanding       Activity Barriers & Risk Stratification:  Activity Barriers & Cardiac Risk Stratification - 07/23/21 1137       Activity Barriers & Cardiac Risk Stratification   Activity Barriers Incisional Pain;Deconditioning;Muscular Weakness    Cardiac Risk Stratification High             6 Minute Walk:  6 Minute Walk     Row Name 07/23/21 1138 09/18/21 0800       6 Minute Walk   Phase Initial Discharge    Distance 1065 feet 1558 feet    Distance % Change -- 46.3 %    Distance Feet Change -- 493 ft    Walk Time 6 minutes 6 minutes    # of Rest Breaks 0 0    MPH 2.01 2.95    METS 2.23 3.17    RPE 11 12    Perceived Dyspnea  1 --    VO2 Peak 7.81 11.1    Symptoms No No    Resting HR 75 bpm 69 bpm    Resting BP 126/76 120/60    Resting Oxygen Saturation  99 % 97 %    Exercise Oxygen Saturation  during 6 min walk 95 % 92 %    Max Ex. HR 90 bpm 94 bpm    Max Ex. BP 144/76 146/70    2 Minute Post BP 124/74 --             Oxygen Initial Assessment:   Oxygen Re-Evaluation:   Oxygen Discharge (Final Oxygen Re-Evaluation):   Initial Exercise Prescription:  Initial Exercise Prescription - 07/23/21 1100       Date of Initial Exercise RX and Referring Provider   Date 07/23/21    Referring Provider [name] MD      Treadmill   MPH 1.8    Grade 0    Minutes 15    METs 2.38      Recumbant Elliptical   Level 1    RPM 50     Minutes 15    METs 2.2      REL-XR   Level 1    Speed 50    Minutes 15    METs 2.2      Track   Laps 26    Minutes 15    METs 2.41      Prescription Details   Frequency (times per week) 3    Duration Progress to 30 minutes of continuous aerobic without signs/symptoms of physical distress      Intensity   THRR 40-80% of Max Heartrate 104-134    Ratings of Perceived Exertion 11-13    Perceived Dyspnea 0-4      Progression   Progression Continue to progress workloads to maintain intensity without signs/symptoms of physical distress.      Resistance Training   Training Prescription Yes    Weight 3 lb    Reps 10-15  Perform Capillary Blood Glucose checks as needed.  Exercise Prescription Changes:   Exercise Prescription Changes     Row Name 07/23/21 1100 08/05/21 1800 08/19/21 1400 09/02/21 0800 09/16/21 0900     Response to Exercise   Blood Pressure (Admit) 126/76 138/78 128/84 124/62 132/76   Blood Pressure (Exercise) 144/76 148/78 -- -- --   Blood Pressure (Exit) 124/74 122/74 110/64 114/60 118/60   Heart Rate (Admit) 75 bpm 77 bpm 81 bpm 82 bpm 66 bpm   Heart Rate (Exercise) 90 bpm 104 bpm 104 bpm 119 bpm 93 bpm   Heart Rate (Exit) 78 bpm 85 bpm 79 bpm 81 bpm 77 bpm   Oxygen Saturation (Admit) 99 % -- -- -- --   Oxygen Saturation (Exercise) 95 % -- -- -- --   Oxygen Saturation (Exit) 97 % -- -- -- --   Rating of Perceived Exertion (Exercise) 11 12 13 14 13    Perceived Dyspnea (Exercise) 1 -- -- -- --   Symptoms none none none none none   Comments walk test results 4th full day of exercise -- -- --   Duration -- Progress to 30 minutes of  aerobic without signs/symptoms of physical distress Progress to 30 minutes of  aerobic without signs/symptoms of physical distress Continue with 30 min of aerobic exercise without signs/symptoms of physical distress. Continue with 30 min of aerobic exercise without signs/symptoms of physical distress.   Intensity  -- THRR unchanged THRR unchanged THRR unchanged THRR unchanged     Progression   Progression -- Continue to progress workloads to maintain intensity without signs/symptoms of physical distress. Continue to progress workloads to maintain intensity without signs/symptoms of physical distress. Continue to progress workloads to maintain intensity without signs/symptoms of physical distress. Continue to progress workloads to maintain intensity without signs/symptoms of physical distress.   Average METs -- 2.56 3.3 3.29 3.88     Resistance Training   Training Prescription -- Yes Yes Yes Yes   Weight -- 3 lb 3 lb 3 lb 3 lb   Reps -- 10-15 10-15 10-15 10-15     Interval Training   Interval Training -- No No No Yes     NuStep   Level -- -- 4 4 4    Minutes -- -- 15 15 15    METs -- -- 3.4 4.3 5     Arm Ergometer   Level -- -- -- 1 --   Minutes -- -- -- 15 --   METs -- -- -- 2.5 --     Recumbant Elliptical   Level -- 1 -- -- --   Minutes -- 15 -- -- --   METs -- 1.4 -- -- --     REL-XR   Level -- 2 -- -- --   Minutes -- 15 -- -- --   METs -- 3.4 -- -- --     Biostep-RELP   Level -- -- -- 3 3   Minutes -- -- -- 15 15   METs -- -- -- 2 4     Track   Laps -- 30 40 43 43   Minutes -- 15 15 15 15    METs -- 2.63 3.18 3.43 3.34     Home Exercise Plan   Plans to continue exercise at -- -- -- Home (comment)  walking, staff videos Home (comment)  walking, staff videos   Frequency -- -- -- Add 2 additional days to program exercise sessions. Add 2 additional days to program exercise sessions.  Initial Home Exercises Provided -- -- -- 09/02/21 09/02/21     Oxygen   Maintain Oxygen Saturation -- 88% or higher 88% or higher 88% or higher 88% or higher            Exercise Comments:   Exercise Comments     Row Name 07/29/21 6314 09/25/21 0827         Exercise Comments First full day of exercise!  Patient was oriented to gym and equipment including functions, settings, policies,  and procedures.  Patient's individual exercise prescription and treatment plan were reviewed.  All starting workloads were established based on the results of the 6 minute walk test done at initial orientation visit.  The plan for exercise progression was also introduced and progression will be customized based on patient's performance and goals. Alazia graduated today from  rehab with 36 sessions completed.  Details of the patient's exercise prescription and what She needs to do in order to continue the prescription and progress were discussed with patient.  Patient was given a copy of prescription and goals.  Patient verbalized understanding.  Tiaja plans to continue to exercise by walking at home and using staff videos.               Exercise Goals and Review:   Exercise Goals     Row Name 07/23/21 1156             Exercise Goals   Increase Physical Activity Yes       Intervention Provide advice, education, support and counseling about physical activity/exercise needs.;Develop an individualized exercise prescription for aerobic and resistive training based on initial evaluation findings, risk stratification, comorbidities and participant's personal goals.       Expected Outcomes Short Term: Attend rehab on a regular basis to increase amount of physical activity.;Long Term: Add in home exercise to make exercise part of routine and to increase amount of physical activity.;Long Term: Exercising regularly at least 3-5 days a week.       Increase Strength and Stamina Yes       Intervention Provide advice, education, support and counseling about physical activity/exercise needs.;Develop an individualized exercise prescription for aerobic and resistive training based on initial evaluation findings, risk stratification, comorbidities and participant's personal goals.       Expected Outcomes Short Term: Increase workloads from initial exercise prescription for resistance, speed, and METs.;Short  Term: Perform resistance training exercises routinely during rehab and add in resistance training at home;Long Term: Improve cardiorespiratory fitness, muscular endurance and strength as measured by increased METs and functional capacity (6MWT)       Able to understand and use rate of perceived exertion (RPE) scale Yes       Intervention Provide education and explanation on how to use RPE scale       Expected Outcomes Short Term: Able to use RPE daily in rehab to express subjective intensity level;Long Term:  Able to use RPE to guide intensity level when exercising independently       Able to understand and use Dyspnea scale Yes       Intervention Provide education and explanation on how to use Dyspnea scale       Expected Outcomes Short Term: Able to use Dyspnea scale daily in rehab to express subjective sense of shortness of breath during exertion;Long Term: Able to use Dyspnea scale to guide intensity level when exercising independently       Knowledge and understanding of Target Heart Rate Range (  THRR) Yes       Intervention Provide education and explanation of THRR including how the numbers were predicted and where they are located for reference       Expected Outcomes Short Term: Able to state/look up THRR;Long Term: Able to use THRR to govern intensity when exercising independently;Short Term: Able to use daily as guideline for intensity in rehab       Able to check pulse independently Yes       Intervention Provide education and demonstration on how to check pulse in carotid and radial arteries.;Review the importance of being able to check your own pulse for safety during independent exercise       Expected Outcomes Short Term: Able to explain why pulse checking is important during independent exercise;Long Term: Able to check pulse independently and accurately       Understanding of Exercise Prescription Yes       Intervention Provide education, explanation, and written materials on patient's  individual exercise prescription       Expected Outcomes Short Term: Able to explain program exercise prescription;Long Term: Able to explain home exercise prescription to exercise independently                Exercise Goals Re-Evaluation :  Exercise Goals Re-Evaluation     Row Name 07/29/21 0837 08/05/21 1824 08/19/21 1439 09/02/21 0835 09/16/21 0905     Exercise Goal Re-Evaluation   Exercise Goals Review Able to understand and use rate of perceived exertion (RPE) scale;Knowledge and understanding of Target Heart Rate Range (THRR);Understanding of Exercise Prescription;Able to understand and use Dyspnea scale Increase Physical Activity;Increase Strength and Stamina Increase Physical Activity;Increase Strength and Stamina Increase Physical Activity;Increase Strength and Stamina;Able to understand and use rate of perceived exertion (RPE) scale;Able to understand and use Dyspnea scale;Knowledge and understanding of Target Heart Rate Range (THRR);Able to check pulse independently;Understanding of Exercise Prescription Increase Physical Activity;Increase Strength and Stamina   Comments Reviewed RPE and dyspnea scales, THR and program prescription with pt today.  Pt voiced understanding and was given a copy of goals to take home. Nohealani is doing well for her first full couple weeks of exercise. She has tolerated the track well and is up to 30 laps in total. She has already increased to level 2 on the XR. Will continue to monitor as she progresses over time. Yasmeen continues to progress well and has reached 40 laps on the track!  She is up to level 4 on T4.  Staff will continue to monitor. Reviewed home exercise with pt today.  Pt plans to walk and use staff videos at home for exercise.  Reviewed THR, pulse, RPE, sign and symptoms, pulse oximetery and when to call 911 or MD.  Also discussed weather considerations and indoor options.  Pt voiced understanding. Francella continues to do well in rehab. She has  reached up to 43 laps on the track! She also worked up to 4 METs on the [employer]. She reached hers THR some sessions but RPEs are in good range. She would benefit from increasing to 4 lbs for handweights. Will continue to monitor.   Expected Outcomes Short: Use RPE daily to regulate intensity. Long: Follow program prescription in THR. Short: Continue to increase laps on track Long: Continue to increase overall MET level Short: maintain consistent attendance Long: continue to build stamina Short: Start to add in mroe exercise at home Long: Conitnue to improve stamina Short: Increase to 4 lbs Long: Continue to increase overall  MET level    Row Name 09/20/21 0831             Exercise Goal Re-Evaluation   Exercise Goals Review Increase Physical Activity;Increase Strength and Stamina;Understanding of Exercise Prescription       Comments Empress improved his post walk by over 400 ft!!  She will be graduating next week and plans to continue to exercise by walking at home.  She is also using weights at home to help.  She feel better overall and likes the way exercise makes her feel.       Expected Outcomes Continue to exercise independently                Discharge Exercise Prescription (Final Exercise Prescription Changes):  Exercise Prescription Changes - 09/16/21 0900       Response to Exercise   Blood Pressure (Admit) 132/76    Blood Pressure (Exit) 118/60    Heart Rate (Admit) 66 bpm    Heart Rate (Exercise) 93 bpm    Heart Rate (Exit) 77 bpm    Rating of Perceived Exertion (Exercise) 13    Symptoms none    Duration Continue with 30 min of aerobic exercise without signs/symptoms of physical distress.    Intensity THRR unchanged      Progression   Progression Continue to progress workloads to maintain intensity without signs/symptoms of physical distress.    Average METs 3.88      Resistance Training   Training Prescription Yes    Weight 3 lb    Reps 10-15      Interval  Training   Interval Training Yes      NuStep   Level 4    Minutes 15    METs 5      Biostep-RELP   Level 3    Minutes 15    METs 4      Track   Laps 43    Minutes 15    METs 3.34      Home Exercise Plan   Plans to continue exercise at Home (comment)   walking, staff videos   Frequency Add 2 additional days to program exercise sessions.    Initial Home Exercises Provided 09/02/21      Oxygen   Maintain Oxygen Saturation 88% or higher             Nutrition:  Target Goals: Understanding of nutrition guidelines, daily intake of sodium <1574m, cholesterol <208m calories 30% from fat and 7% or less from saturated fats, daily to have 5 or more servings of fruits and vegetables.  Education: All About Nutrition: -Group instruction provided by verbal, written material, interactive activities, discussions, models, and posters to present general guidelines for heart healthy nutrition including fat, fiber, MyPlate, the role of sodium in heart healthy nutrition, utilization of the nutrition label, and utilization of this knowledge for meal planning. Follow up email sent as well. Written material given at graduation. Flow[facility]  Education need identified 07/23/21  Date 09/25/21  Educator MCLodiInstruction Review Code 1- Verbalizes Understanding       Biometrics:  Pre Biometrics - 07/23/21 1137       Pre Biometrics   Height 5' 4.2" (1.631 m)    Weight 176 lb 6.4 oz (80 kg)    BMI (Calculated) 30.08    Single Leg Stand 11.97 seconds  Post Biometrics - 09/18/21 0800        Post  Biometrics   Height 5' 4.2" (1.631 m)    Weight 173 lb 14.4 oz (78.9 kg)    BMI (Calculated) 29.65    Single Leg Stand 30 seconds             Nutrition Therapy Plan and Nutrition Goals:  Nutrition Therapy & Goals - 08/05/21 0848       Nutrition Therapy   Diet Heart healthy, low Na    Drug/Food  Interactions Statins/Certain Fruits    Protein (specify units) 65g    Fiber 25 grams    Whole Grain Foods 3 servings    Saturated Fats 16 max. grams    Fruits and Vegetables 8 servings/day    Sodium 2 grams      Personal Nutrition Goals   Nutrition Goal ST: add fruit to morning routine, swap to a whole grain cereal (she is considering bran flakes) LT: continue with changes made so far, eat at least 5 fruit/vegetable servings per day, limit saturated fat <16g/day    Comments 71 y.o. F admitted to cardiac rehab s/p CABG x4. PMHx includes HTN, CAD, HLD, erythrocytosis, colon cancer, sleep apnea. Relevant medications includes Ca w/ vit D, vit E. PYP Score: 65. Vegetables & Fruits 6/12. Breads, Grains & Cereals 7/12. Red & Processed Meat 10/12. Poultry 0/2. Fish & Shellfish 1/4. Beans, Nuts & Seeds 2/4. Milk & Dairy Foods 4/6. Toppings, Oils, Seasonings & Salt 15/20. Sweets, Snacks & Restaurant Food 12/14. Beverages 8/10.  Since her surgery she has been limintg bread, potatoes, sweets, and sugar sweetened beverages. B: bowl of cereal (cheerios or crispix with fat free milk) or on Sunday monring - [place] sausage and egg with cheese. S: welches fruit snacks D: Will cook baked chicken or porkchops (usually with cream of onion soup), lima beans, green beans, delviled eggs sometimes.  Flounder and slaw at [facility] on Saturdays. [place] crusted chicken with broccoli, corn, and salad from [employer] on Sundays. Drinks: water w/ lemon. Discussed heart healthy eating. Checked to make sure MD following her supplementation of Ca-vit D and vit E - she wants to ask them to see if it is still necessary for her to take as she does not like them.      Intervention Plan   Intervention Prescribe, educate and counsel regarding individualized specific dietary modifications aiming towards targeted core components such as weight, hypertension, lipid management, diabetes, heart failure and other comorbidities.    Expected  Outcomes Short Term Goal: Understand basic principles of dietary content, such as calories, fat, sodium, cholesterol and nutrients.;Short Term Goal: A plan has been developed with personal nutrition goals set during dietitian appointment.;Long Term Goal: Adherence to prescribed nutrition plan.             Nutrition Assessments:  MEDIFICTS Score Key: ?70 Need to make dietary changes  40-70 Heart Healthy Diet ? 40 Therapeutic Level Cholesterol Diet  Flowsheet Row Cardiac Rehab from 09/20/2021 in [facility] Cardiac and Pulmonary Rehab  Picture Your Plate Total Score on Discharge 55      Picture Your Plate Scores: <93 Unhealthy dietary pattern with much room for improvement. 41-50 Dietary pattern unlikely to meet recommendations for good health and room for improvement. 51-60 More healthful dietary pattern, with some room for improvement.  >60 Healthy dietary pattern, although there may be some specific behaviors that could be improved.    Nutrition Goals Re-Evaluation:  Nutrition Goals Re-Evaluation  [place] Name 09/02/21 0825 09/20/21 8341           Goals   Nutrition Goal ST: add fruit to morning routine, swap to a whole grain cereal (she is considering bran flakes) LT: continue with changes made so far, eat at least 5 fruit/vegetable servings per day, limit saturated fat <16g/day Short: Continue to add in more fruit and vegetables Long: Continue to follow heart healthy diet.      Comment Delitha is doing better with her diet. She is doing better with getting in more fruit into her diet including an apple every morning before rehab.  She is also adding in grapes and applesauce.   She is now also eating raisin bran each morning.  She is trying to adapt in the changes. Zanasia continues to work on her diet.  She is doing better with fruit and vegetables and continues to aim for more variety overall.  She now feels that she has a better grasp on what to do with her diet in general.       Expected Outcome Short: Continue to add in more fruit and vegetables Long: Continue to follow heart healthy diet. Continue to follow heart healthy diet               Nutrition Goals Discharge (Final Nutrition Goals Re-Evaluation):  Nutrition Goals Re-Evaluation - 09/20/21 9622       Goals   Nutrition Goal Short: Continue to add in more fruit and vegetables Long: Continue to follow heart healthy diet.    Comment Marijane continues to work on her diet.  She is doing better with fruit and vegetables and continues to aim for more variety overall.  She now feels that she has a better grasp on what to do with her diet in general.    Expected Outcome Continue to follow heart healthy diet             Psychosocial: Target Goals: Acknowledge presence or absence of significant depression and/or stress, maximize coping skills, provide positive support system. Participant is able to verbalize types and ability to use techniques and skills needed for reducing stress and depression.   Education: Stress, Anxiety, and Depression - Group verbal and visual presentation to define topics covered.  Reviews how body is impacted by stress, anxiety, and depression.  Also discusses healthy ways to reduce stress and to treat/manage anxiety and depression.  Written material given at graduation. Flowsheet Row Cardiac Rehab from 09/25/2021 in [facility]  Date 08/21/21  Educator [facility]  Instruction Review Code 1- [employer] Understanding       Education: Sleep Hygiene -Provides group verbal and written instruction about how sleep can affect your health.  Define sleep hygiene, discuss sleep cycles and impact of sleep habits. Review good sleep hygiene tips.    Initial Review & Psychosocial Screening:  Initial Psych Review & Screening - 07/17/21 1049       Initial Review   Current issues with None Identified      Family Dynamics   Good Support System? Yes   husband, family      Barriers   Psychosocial barriers to participate in program There are no identifiable barriers or psychosocial needs.;The patient should benefit from training in stress management and relaxation.      Screening Interventions   Interventions Encouraged to exercise;Provide feedback about the scores to participant;To provide support and resources with identified psychosocial needs    Expected Outcomes Short Term goal: Utilizing psychosocial  counselor, staff and physician to assist with identification of specific Stressors or current issues interfering with healing process. Setting desired goal for each stressor or current issue identified.;Long Term Goal: Stressors or current issues are controlled or eliminated.;Short Term goal: Identification and review with participant of any Quality of Life or Depression concerns found by scoring the questionnaire.;Long Term goal: The participant improves quality of Life and PHQ9 Scores as seen by post scores and/or verbalization of changes             Quality of Life Scores:   Quality of Life - 09/20/21 0839       Quality of Life Scores   Health/Function Pre 28.04 %    Health/Function Post 28.04 %    Health/Function % Change 0 %    Socioeconomic Pre 29.17 %    Socioeconomic Post 28.07 %    Socioeconomic % Change  -3.77 %    Psych/Spiritual Pre 30 %    Psych/Spiritual Post 30 %    Psych/Spiritual % Change 0 %    Family Pre 24.9 %    Family Post 24.9 %    Family % Change 0 %    GLOBAL Pre 28.2 %    GLOBAL Post 27.98 %    GLOBAL % Change -0.78 %            Scores of 19 and below usually indicate a poorer quality of life in these areas.  A difference of  2-3 points is a clinically meaningful difference.  A difference of 2-3 points in the total score of the Quality of Life Index has been associated with significant improvement in overall quality of life, self-image, physical symptoms, and general health in studies assessing change in quality of  life.  PHQ-9: Review Flowsheet        09/24/2021 09/20/2021 07/23/2021 12/20/2020 03/06/2020  Depression screen PHQ 2/9  Decreased Interest 0 0 0 0 0  Down, Depressed, Hopeless 0 0 0 0 0  PHQ - 2 Score 0 0 0 0 0  Altered sleeping 0 0 0    Tired, decreased energy 0 0 0    Change in appetite 0 0 0    Feeling bad or failure about yourself  0 0 0    Trouble concentrating 0 0 0    Moving slowly or fidgety/restless 0 0 0    Suicidal thoughts 0 0 0    PHQ-9 Score 0 0 0    Difficult doing work/chores  Not difficult at all Not difficult at all           Interpretation of Total Score  Total Score Depression Severity:  1-4 = Minimal depression, 5-9 = Mild depression, 10-14 = Moderate depression, 15-19 = Moderately severe depression, 20-27 = Severe depression   Psychosocial Evaluation and Intervention:  Psychosocial Evaluation - 07/17/21 1050       Psychosocial Evaluation & Interventions   Interventions Encouraged to exercise with the program and follow exercise prescription    Comments Konstantina is coming to cardiac rehab post CABG x 4. She states she has been healing well with some soreness that gets better each day. Before finding out she needed surgery, she had been walking at the mall and noticed that she kept having to take breaks more frequently. When she mentioned her symptoms to the doctor, she was sent to see the cardiologist where she learned she needed a CABG and that's why she had been gaining weight. Post surgery, her weight is  back to her normal and she has been feeling stronger each day. She wants to gain more stamina and strength. She also wants to learn more about heart healthy living and more about exercise.    Expected Outcomes Short; attend cardiac rehab for education and exercise. Long: develop and maintian positive self care habits.    Continue Psychosocial Services  Follow up required by staff             Psychosocial Re-Evaluation:  Psychosocial Re-Evaluation      [place] Name 08/12/21 [phone] 09/02/21 8185 09/20/21 6314         Psychosocial Re-Evaluation   Current issues with None Identified None Identified None Identified     Comments Patient reports no issues with their current mental states, sleep, stress, depression or anxiety. Will follow up with patient in a few weeks for any changes. Jahmiya continues to do well in rehab.  She is feeling good mentally and sleeps well.  She does have issues waking up from hot flashes with her CPAP.  She is very compliant with her CPAP. She is generally positive and tries not to let anything get to her too much. Anu gradates next week and has enjoyed coming to rehab.  She feels that she has learned a lot about nutrition and exercise.  She has learned how to push herself!  She is proud of her progress.     Expected Outcomes Short: Continue to exercise regularly to support mental health and notify staff of any changes. Long: maintain mental health and well being through teaching of rehab or prescribed medications independently. Short: Continue to exercise for mental boost Long: Conitnue to stay positive Continue to exercise for a mental boost.     Interventions Encouraged to attend Cardiac Rehabilitation for the exercise Encouraged to attend Cardiac Rehabilitation for the exercise Encouraged to attend Cardiac Rehabilitation for the exercise     Continue Psychosocial Services  Follow up required by staff -- Follow up required by staff              Psychosocial Discharge (Final Psychosocial Re-Evaluation):  Psychosocial Re-Evaluation - 09/20/21 0832       Psychosocial Re-Evaluation   Current issues with None Identified    Comments adna nofziger next week and has enjoyed coming to rehab.  She feels that she has learned a lot about nutrition and exercise.  She has learned how to push herself!  She is proud of her progress.    Expected Outcomes Continue to exercise for a mental boost.    Interventions Encouraged to  attend Cardiac Rehabilitation for the exercise    Continue Psychosocial Services  Follow up required by staff             Vocational Rehabilitation: Provide vocational rehab assistance to qualifying candidates.   Vocational Rehab Evaluation & Intervention:  Vocational Rehab - 09/20/21 0836       Initial Vocational Rehab Evaluation & Intervention   Assessment shows need for Vocational Rehabilitation No      Discharge Vocational Rehab   Discharge Vocational Rehabilitation retired             Education: Education Goals: Education classes will be provided on a variety of topics geared toward better understanding of heart health and risk factor modification. Participant will state understanding/return demonstration of topics presented as noted by education test scores.  Learning Barriers/Preferences:  Learning Barriers/Preferences - 07/17/21 1043       Learning Barriers/Preferences   Learning Barriers  None    Learning Preferences Individual Instruction             General Cardiac Education Topics:  AED/CPR: - Group verbal and written instruction with the use of models to demonstrate the basic use of the AED with the basic ABC's of resuscitation.   Anatomy and Cardiac Procedures: - Group verbal and visual presentation and models provide information about basic cardiac anatomy and function. Reviews the testing methods done to diagnose heart disease and the outcomes of the test results. Describes the treatment choices: Medical Management, Angioplasty, or Coronary Bypass Surgery for treating various heart conditions including Myocardial Infarction, Angina, Valve Disease, and Cardiac Arrhythmias.  Written material given at graduation. Flowsheet Row Cardiac Rehab from 09/25/2021 in [facility] Cardiac and Pulmonary Rehab  Education need identified 07/23/21  Date 09/11/21  Educator SB  Instruction Review Code 1- Verbalizes Understanding       Medication Safety: - Group  verbal and visual instruction to review commonly prescribed medications for heart and lung disease. Reviews the medication, class of the drug, and side effects. Includes the steps to properly store meds and maintain the prescription regimen.  Written material given at graduation. Flowsheet Row Cardiac Rehab from 09/25/2021 in [facility] Cardiac and Pulmonary Rehab  Date 07/31/21  Educator SB  Instruction Review Code 1- Verbalizes Understanding       Intimacy: - Group verbal instruction through game format to discuss how heart and lung disease can affect sexual intimacy. Written material given at graduation.. Flowsheet Row Cardiac Rehab from 09/25/2021 in [facility] Cardiac and Pulmonary Rehab  Date 09/04/21  Educator [facility]  Instruction Review Code 1- Verbalizes Understanding       Know Your Numbers and Heart Failure: - Group verbal and visual instruction to discuss disease risk factors for cardiac and pulmonary disease and treatment options.  Reviews associated critical values for Overweight/Obesity, Hypertension, Cholesterol, and Diabetes.  Discusses basics of heart failure: signs/symptoms and treatments.  Introduces Heart Failure Zone chart for action plan for heart failure.  Written material given at graduation. Flowsheet Row Cardiac Rehab from 09/25/2021 in [facility] Cardiac and Pulmonary Rehab  Date 08/07/21  Educator SB  Instruction Review Code 1- Verbalizes Understanding       Infection Prevention: - Provides verbal and written material to individual with discussion of infection control including proper hand washing and proper equipment cleaning during exercise session. Flowsheet Row Cardiac Rehab from 09/25/2021 in [facility] Cardiac and Pulmonary Rehab  Education need identified 07/23/21  Date 07/23/21  Educator [place]  Instruction Review Code 1- Verbalizes Understanding       Falls Prevention: - Provides verbal and written material to individual with discussion of falls prevention and  safety. Flowsheet Row Cardiac Rehab from 09/25/2021 in [facility] Cardiac and Pulmonary Rehab  Education need identified 07/23/21  Date 07/23/21  Educator [place]  Instruction Review Code 1- Verbalizes Understanding       Other: -Provides group and verbal instruction on various topics (see comments)   Knowledge Questionnaire Score:  Knowledge Questionnaire Score - 09/20/21 0836       Knowledge Questionnaire Score   Pre Score 22/26: Angina, sexual intercourse, Nutrition, Tobacco    Post Score 22/26             Core Components/Risk Factors/Patient Goals at Admission:  Personal Goals and Risk Factors at Admission - 07/23/21 1156       Core Components/Risk Factors/Patient Goals on Admission    Weight Management Yes;Weight Loss    Intervention Weight  Management: Provide education and appropriate resources to help participant work on and attain dietary goals.;Weight Management: Develop a combined nutrition and exercise program designed to reach desired caloric intake, while maintaining appropriate intake of nutrient and fiber, sodium and fats, and appropriate energy expenditure required for the weight goal.;Weight Management/Obesity: Establish reasonable short term and long term weight goals.    Admit Weight 176 lb (79.8 kg)    Goal Weight: Short Term 172 lb (78 kg)    Goal Weight: Long Term 166 lb (75.3 kg)    Expected Outcomes Long Term: Adherence to nutrition and physical activity/exercise program aimed toward attainment of established weight goal;Short Term: Continue to assess and modify interventions until short term weight is achieved;Weight Loss: Understanding of general recommendations for a balanced deficit meal plan, which promotes 1-2 lb weight loss per week and includes a negative energy balance of [phone] kcal/d;Understanding recommendations for meals to include 15-35% energy as protein, 25-35% energy from fat, 35-60% energy from carbohydrates, less than 259m of dietary  cholesterol, 20-35 gm of total fiber daily;Understanding of distribution of calorie intake throughout the day with the consumption of 4-5 meals/snacks    Hypertension Yes    Intervention Provide education on lifestyle modifcations including regular physical activity/exercise, weight management, moderate sodium restriction and increased consumption of fresh fruit, vegetables, and low fat dairy, alcohol moderation, and smoking cessation.;Monitor prescription use compliance.    Expected Outcomes Short Term: Continued assessment and intervention until BP is < 140/953mHG in hypertensive participants. < 130/8029mG in hypertensive participants with diabetes, heart failure or chronic kidney disease.;Long Term: Maintenance of blood pressure at goal levels.    Lipids Yes    Intervention Provide education and support for participant on nutrition & aerobic/resistive exercise along with prescribed medications to achieve LDL <29m46mDL >40mg60m Expected Outcomes Short Term: Participant states understanding of desired cholesterol values and is compliant with medications prescribed. Participant is following exercise prescription and nutrition guidelines.;Long Term: Cholesterol controlled with medications as prescribed, with individualized exercise RX and with personalized nutrition plan. Value goals: LDL < 29mg,12m > 40 mg.             Education:Diabetes - Individual verbal and written instruction to review signs/symptoms of diabetes, desired ranges of glucose level fasting, after meals and with exercise. Acknowledge that pre and post exercise glucose checks will be done for 3 sessions at entry of program.   Core Components/Risk Factors/Patient Goals Review:   Goals and Risk Factor Review     [place] Name 08/12/21 0804 09/02/21 0832 09/20/21 0834         Core Components/Risk Factors/Patient Goals Review   Personal Goals Review Weight Management/Obesity;Hypertension Weight Management/Obesity;Hypertension  Weight Management/Obesity;Hypertension     Review ShirleSadiyas that she wants to lose some weight. Her blood pressure has been stable and within normal limits. She states that her weight goal is around 165 pounds. Shirely is doing well in rehab.  Her weight is still steadily coming down, but she still wants to lose more weight. Her pressures are doing good and she is going to bring her cuff in to check again what we are getting here in class. ShirleKadeishaone well in rehab. She continues to work on weight loss.  Her blood pressures continue to do well and she continues to keep an eye on it at home.     Expected Outcomes Short: lose 5 pounds in the next couple weeks. Long: maintain weight loss and reach  weight goal. Short: Bring in blood pressure cuff Long: continue to work on weight loss Continue to montior risk factors.              Core Components/Risk Factors/Patient Goals at Discharge (Final Review):   Goals and Risk Factor Review - 09/20/21 0834       Core Components/Risk Factors/Patient Goals Review   Personal Goals Review Weight Management/Obesity;Hypertension    Review Eisa has done well in rehab. She continues to work on weight loss.  Her blood pressures continue to do well and she continues to keep an eye on it at home.    Expected Outcomes Continue to montior risk factors.             ITP Comments:  ITP Comments     Row Name 07/17/21 1034 07/23/21 1128 07/24/21 0848 07/29/21 0836 08/05/21 1108   ITP Comments Initial telephone orientation completed. Diagnosis can be found in CHL 1/3. EP orientation scheduled for Tuesday 3/21 at 8am. Completed 6MWT and gym orientation. Initial ITP created and sent for review to Dr. Emily Filbert, Medical Director. 30 Day review completed. Medical Director ITP review done, changes made as directed, and signed approval by Medical Director.   New to program First full day of exercise!  Patient was oriented to gym and equipment including  functions, settings, policies, and procedures.  Patient's individual exercise prescription and treatment plan were reviewed.  All starting workloads were established based on the results of the 6 minute walk test done at initial orientation visit.  The plan for exercise progression was also introduced and progression will be customized based on patient's performance and goals. Completed initial RD consultation    [place] Name 08/21/21 1349 09/18/21 0901 09/25/21 0827       ITP Comments 30 Day review completed. Medical Director ITP review done, changes made as directed, and signed approval by Medical Director. 30 Day review completed. Medical Director ITP review done, changes made as directed, and signed approval by Medical Director. Shardee graduated today from  rehab with 36 sessions completed.  Details of the patient's exercise prescription and what She needs to do in order to continue the prescription and progress were discussed with patient.  Patient was given a copy of prescription and goals.  Patient verbalized understanding.  Erikka plans to continue to exercise by walking at home and using staff videos.              Comments: discharge ITP

## 2021-09-25 NOTE — Telephone Encounter (Signed)
Called and spoke to pt, informed her that Dr. Tasia Catchings recommends US Liver to be moved up.   Please move US liver (9/7) up to June and inform pt of appt.

## 2021-09-25 NOTE — Progress Notes (Signed)
Daily Session Note  Patient Details  Name: Susan Weeks MRN: [id number] Date of Birth: 1950/08/18 Referring Provider:   Flowsheet Row Cardiac Rehab from 07/23/2021 in [facility] Cardiac and Pulmonary Rehab  Referring Provider [name] MD       Encounter Date: 09/25/2021  Check In:  Session Check In - 09/25/21 0801       Check-In   Supervising physician immediately available to respond to emergencies See telemetry face sheet for immediately available ER MD    Location [facility]-Cardiac & Pulmonary Rehab    Staff Present Birdie Sons, MPA, RN;Jessica Luan Pulling, MA, RCEP, CCRP, CCET    Virtual Visit No    Medication changes reported     No    Fall or balance concerns reported    No    Warm-up and Cool-down Performed on first and last piece of equipment    Resistance Training Performed Yes    VAD Patient? No    PAD/SET Patient? No      Pain Assessment   Currently in Pain? No/denies                Social History   Tobacco Use  Smoking Status Former   Years: 10.00   Types: Cigarettes   Quit date: 1978   Years since quitting: 45.4  Smokeless Tobacco Never  Tobacco Comments   1 pack per week--only smoked on the weekends.     Goals Met:  Independence with exercise equipment Exercise tolerated well No report of concerns or symptoms today Strength training completed today  Goals Unmet:  Not Applicable  Comments:  [name] graduated today from  rehab with 36 sessions completed.  Details of the patient's exercise prescription and what She needs to do in order to continue the prescription and progress were discussed with patient.  Patient was given a copy of prescription and goals.  Patient verbalized understanding.  [name] plans to continue to exercise by walking at home and using staff videos.     Dr. Emily Filbert is Medical Director for [place].  Dr. Ottie Glazier is Medical Director for [facility] Pulmonary Rehabilitation.

## 2021-09-25 NOTE — Telephone Encounter (Signed)
-----   Message from Earlie Server, MD sent at 09/25/2021 11:27 AM EDT ----- Regarding: RE: question I wonder if the elevated LFT could be due to transient hypotension during her bypass surgery, ? shock liver.  We can move her liver contrast [place] earlier to now to make sure they are not growing.    ----- Message ----- From: [name], MD Sent: 09/25/2021   3:43 AM EDT To: Earlie Server, MD Subject: question                                       Ms Heatwole is patent that you follow. She has a history of colon cancer and recently has been evaluated for liver lesions.  She recently underwent bypass surgery.  Was placed on a statin medication.  Liver function tests increased.  Statin was stopped.  I saw her in follow up.  Repeat liver function tests - remained elevated.  I know she is scheduled for follow scans of her liver, but was wanting to get your advice about any further testing at this time, given persistent elevation.  Thank you for your help.  I really appreciate it.    [name]

## 2021-09-26 NOTE — Telephone Encounter (Signed)
Patient's US liver appt had to be moved up to 6/6 @ 10a arrive 9a, per IR scheduling. Pt informed of change and is ok with date/time. Patient will hold aspirin 5 days, last dose on 6/1. pt verbalized understanding.

## 2021-09-27 ENCOUNTER — Other Ambulatory Visit: Payer: Self-pay

## 2021-09-27 DIAGNOSIS — R945 Abnormal results of liver function studies: Secondary | ICD-10-CM

## 2021-09-29 ENCOUNTER — Encounter: Payer: Self-pay | Admitting: Internal Medicine

## 2021-09-29 NOTE — Assessment & Plan Note (Signed)
Liver lesion - MRI. Seeing oncology.  Has f/u ultrasound scanning planned.  Recently on statin medication.  Increased LFTs.  Statin stopped.  Recheck liver panel today.

## 2021-09-29 NOTE — Assessment & Plan Note (Signed)
Continues on crestor.  Low cholesterol diet and exercise.  Follow lipid panel and liver function tests.   Lab Results  Component Value Date   CHOL 147 11/30/2020   HDL 61 11/30/2020   LDLCALC 70 11/30/2020   LDLDIRECT 53.0 07/18/2021   TRIG 79 11/30/2020   CHOLHDL 2.4 11/30/2020   

## 2021-09-29 NOTE — Assessment & Plan Note (Signed)
Follow cbc.  

## 2021-09-29 NOTE — Assessment & Plan Note (Signed)
Colonoscopy 10/2019 - recommended f/u in 3 years.  

## 2021-09-29 NOTE — Assessment & Plan Note (Signed)
Followed by Dr Kipp Brood as outlined.  Planning for f/u cxr.

## 2021-09-29 NOTE — Assessment & Plan Note (Signed)
Continue cpap.  

## 2021-09-29 NOTE — Assessment & Plan Note (Addendum)
Doing well on amlodipine, metoprolol.  Off statin due to liver enzyme elevation. No pain.  Follow.  

## 2021-09-29 NOTE — Assessment & Plan Note (Signed)
Followed by hematology as outlined.  Continue cpap.  

## 2021-09-29 NOTE — Assessment & Plan Note (Signed)
Low carb diet and exercise.  Follow met b and a1c.  

## 2021-09-29 NOTE — Assessment & Plan Note (Addendum)
Had swelling on lisinopril.  On amlodipine. Blood pressure as outlined..  Follow pressures.  Follow metabolic panel. Pressures have been doing well at rehab.

## 2021-10-02 ENCOUNTER — Other Ambulatory Visit: Payer: Self-pay | Admitting: Internal Medicine

## 2021-10-05 ENCOUNTER — Telehealth: Payer: Self-pay | Admitting: Internal Medicine

## 2021-10-05 NOTE — Telephone Encounter (Signed)
-----   Message from Earlie Server, MD sent at 09/26/2021  4:20 PM EDT ----- Regarding: RE: question No worries. We have already moved up her liver contrast enhanced [place] to June. That will give [place] more info of the lesions.  ----- Message ----- From: [name], MD Sent: 09/26/2021   5:10 AM EDT To: Earlie Server, MD Subject: RE: question                                   Do you need me to contact your office to get this arranged?  Just let me know what you need me to do. Thank you.    Alexxa Sabet  ----- Message ----- From: Earlie Server, MD Sent: 09/25/2021  11:30 AM EDT To: [name], MD, Evelina Dun, RN Subject: RE: question                                   I wonder if the elevated LFT could be due to transient hypotension during her bypass surgery, ? shock liver.  We can move her liver contrast [place] earlier to now to make sure they are not growing.    ----- Message ----- From: [name], MD Sent: 09/25/2021   3:43 AM EDT To: Earlie Server, MD Subject: question                                       Ms Rester is patent that you follow. She has a history of colon cancer and recently has been evaluated for liver lesions.  She recently underwent bypass surgery.  Was placed on a statin medication.  Liver function tests increased.  Statin was stopped.  I saw her in follow up.  Repeat liver function tests - remained elevated.  I know she is scheduled for follow scans of her liver, but was wanting to get your advice about any further testing at this time, given persistent elevation.  Thank you for your help.  I really appreciate it.    [name]

## 2021-10-08 ENCOUNTER — Ambulatory Visit
Admission: RE | Admit: 2021-10-08 | Discharge: 2021-10-08 | Disposition: A | Discharge status: Home / Self Care | Payer: BC Managed Care – PPO | Source: Ambulatory Visit | Attending: Oncology | Admitting: Oncology

## 2021-10-08 ENCOUNTER — Telehealth: Payer: Self-pay

## 2021-10-08 DIAGNOSIS — K769 Liver disease, unspecified: Secondary | ICD-10-CM

## 2021-10-08 NOTE — Telephone Encounter (Signed)
Patient states the ultrasound of her liver that was scheduled for today, has been changed to 10/23/2021.  Patient states she is still planning to come in on Friday, 10/11/2021, for labs.

## 2021-10-09 ENCOUNTER — Other Ambulatory Visit: Payer: BC Managed Care – PPO

## 2021-10-11 ENCOUNTER — Other Ambulatory Visit (INDEPENDENT_AMBULATORY_CARE_PROVIDER_SITE_OTHER): Payer: BC Managed Care – PPO

## 2021-10-11 DIAGNOSIS — R945 Abnormal results of liver function studies: Secondary | ICD-10-CM | POA: Diagnosis not present

## 2021-10-11 LAB — HEPATIC FUNCTION PANEL
ALT: 176 U/L — ABNORMAL HIGH (ref 0–35)
AST: 134 U/L — ABNORMAL HIGH (ref 0–37)
Albumin: 4 g/dL (ref 3.5–5.2)
Alkaline Phosphatase: 159 U/L — ABNORMAL HIGH (ref 39–117)
Bilirubin, Direct: 0.2 mg/dL (ref 0.0–0.3)
Total Bilirubin: 1.1 mg/dL (ref 0.2–1.2)
Total Protein: 6.4 g/dL (ref 6.0–8.3)

## 2021-10-15 ENCOUNTER — Telehealth: Payer: Self-pay | Admitting: Internal Medicine

## 2021-10-15 NOTE — Telephone Encounter (Signed)
Patient returned office phone call for lab results. 

## 2021-10-17 NOTE — Telephone Encounter (Signed)
See result note given to patient.

## 2021-10-22 ENCOUNTER — Encounter: Payer: Self-pay | Admitting: Nurse Practitioner

## 2021-10-22 ENCOUNTER — Ambulatory Visit: Payer: BC Managed Care – PPO

## 2021-10-22 ENCOUNTER — Ambulatory Visit (INDEPENDENT_AMBULATORY_CARE_PROVIDER_SITE_OTHER): Payer: BC Managed Care – PPO | Admitting: Nurse Practitioner

## 2021-10-22 VITALS — BP 138/84 | HR 72 | Ht 63.0 in | Wt 172.0 lb

## 2021-10-22 DIAGNOSIS — R945 Abnormal results of liver function studies: Secondary | ICD-10-CM

## 2021-10-22 DIAGNOSIS — J90 Pleural effusion, not elsewhere classified: Secondary | ICD-10-CM

## 2021-10-22 DIAGNOSIS — I2510 Atherosclerotic heart disease of native coronary artery without angina pectoris: Secondary | ICD-10-CM

## 2021-10-22 DIAGNOSIS — I10 Essential (primary) hypertension: Secondary | ICD-10-CM

## 2021-10-22 DIAGNOSIS — I25119 Atherosclerotic heart disease of native coronary artery with unspecified angina pectoris: Secondary | ICD-10-CM

## 2021-10-22 DIAGNOSIS — E785 Hyperlipidemia, unspecified: Secondary | ICD-10-CM

## 2021-10-22 NOTE — Progress Notes (Signed)
Office Visit    Patient Name: Susan Weeks Date of Encounter: 10/22/2021  Primary Care Provider:  [name], MD Primary Cardiologist:  Susan Sacramento, MD  Chief Complaint    71 year old female with a history of CAD, hyperlipidemia, hypertension, erythrocytosis, sleep apnea, and colon cancer, who presents for follow-up related to CAD status post CABG earlier this year.  Past Medical History    Past Medical History:  Diagnosis Date   Abnormal liver function    Colon cancer ([place])    adenocarcinoma of the cecum s/p partial colon resection   Coronary artery disease    a. 03/2021 Cor CTA: sev LAD dzs; b. 04/2021 Cath: lM nl, LAD 167m(unable to wire), D1 60, LCX nl, OM3 60, LPAV 100, RCA 90p; c. 05/2021 CABG x 4: LIMA->LAD, VG->RPDA, VG->OM1, VG->OM3.   History of colon polyps    Hyperlipidemia    Hypertension    [place] At[place] Fibrillation ([place]    a. 05/2021 Brief PAF after CABG-->amio.   Sleep apnea 095/18/8416  Systolic murmur    a. 160/6301Echo: EF 60-65%, no rwma, GrI DD, RVSP 28.855mg. Mild-mod MR.   Past Surgical History:  Procedure Laterality Date   ABDOMINAL HYSTERECTOMY  1984   secondary to bleeding and fibroid tumors   APPENDECTOMY     COLONOSCOPY W/ POLYPECTOMY  2001   COLONOSCOPY WITH PROPOFOL N/A 07/28/2016   Procedure: COLONOSCOPY WITH PROPOFOL;  Surgeon: MaLollie SailsMD;  [facility];  Service: Endoscopy;  Laterality: N/A;   CORONARY ARTERY BYPASS GRAFT N/A 05/07/2021   Procedure: CORONARY ARTERY BYPASS GRAFTING (CABG) X 4, USING LEFT INTERNAL MAMMARY ARTERY, LEFT LEG GREATER SAPHENOUS VEIN HARVESTED ENDOSCOPICALLY;  Surgeon: LiLajuana MatteMD;  [facility]: [facility];  Laterality: N/A;   CORONARY STENT INTERVENTION N/A 04/15/2021   Procedure: CORONARY STENT INTERVENTION;  Surgeon: Susan HampshireMD;  [facility];  Service: Cardiovascular;  Laterality: N/A;   DILATION AND  CURETTAGE OF UTERUS  1967   ENDOVEIN HARVEST OF GREATER SAPHENOUS VEIN Left 05/07/2021   Procedure: ENDOVEIN HARVEST OF GREATER SAPHENOUS VEIN;  Surgeon: LiLajuana MatteMD;  [place] [facility]: [facility];  Laterality: Left;   IR THORACENTESIS ASP PLEURAL SPACE W/IMG GUIDE  05/10/2021   LEFT HEART CATH AND CORONARY ANGIOGRAPHY N/A 04/15/2021   Procedure: LEFT HEART CATH AND CORONARY ANGIOGRAPHY;  Surgeon: Susan HampshireMD;  [facility];  Service: Cardiovascular;  Laterality: N/A;   TEE WITHOUT CARDIOVERSION N/A 05/07/2021   Procedure: TRANSESOPHAGEAL ECHOCARDIOGRAM (TEE);  Surgeon: LiLajuana MatteMD;  [facility] Service: Open [facility] Surgery;  Laterality: N/A;    Allergies  Allergies  Allergen Reactions   Tape     Bruising from paper tape   Lisinopril Rash    Developed slight facial swelling and itching over forehead while taking. Unclear if definite ACE allergy, but concern enough to stop in ER today.   Z-Pak [Azithromycin] Nausea And Vomiting    Caused Emesis    History of Present Illness    [age]ear old female with the above past medical history including CAD, hyperlipidemia, hypertension, erythrocytosis, sleep apnea, and colon cancer.  She was first evaluated for exertional dyspnea, increasing lower extremity edema, and an episode of chest fullness by Susan Weeks November 2022.  Coronary CT angiography showed severe LAD disease.  Echo showed normal LV function with grade 1 diastolic dysfunction,  and mild to moderate mitral regurgitation.  Diagnostic catheterization in December 2022, showed occluded LAD and severe RCA disease.  Initially, there was an effort to perform PCI in the LAD with plan for staged PCI to the RCA however, the interventional team was able to cross the LAD with a wire and decision was made to refer for cardiothoracic surgical evaluation.  She subsequently underwent CABG x4 in January 2023.  She had a brief episode of  atrial fibrillation shortly after surgery and this was managed with oral amiodarone.  In February 2023, she required initiation of oral Lasix for 7 days in the setting of a large left pleural effusion.  Plan was also for ultrasound-guided thoracentesis however, ultrasound showed only trace left pleural fluid and the procedure was not performed.  Susan Weeks was last seen in cardiology clinic on July 18, 2021, at which time she was feeling well.  In the setting of elevated LFTs, we have discontinued statin therapy and she has been seeing oncology in the setting of known liver lesion with liver ultrasound pending tomorrow.  Susan Weeks has completed cardiac rehabilitation and is continue to walk 30 to 40 minutes daily at home.  She has been doing exceptionally well and denies chest pain or dyspnea.  She sometimes checks her blood pressure at home and believes it runs in the 130s but is not really sure.  She denies PND, orthopnea, dizziness, syncope, edema, or early satiety.  Home Medications    Prior to Admission medications   Medication Sig Start Date End Date Taking? Authorizing Provider  acetaminophen (TYLENOL) 500 MG tablet Take 500 mg by mouth every 6 (six) hours as needed for moderate pain.   Yes [provider]  amLODipine (NORVASC) 5 MG tablet TAKE 1 TABLET BY MOUTH EVERY DAY 10/02/21  Yes Susan Pheasant, MD  aspirin EC 325 MG EC tablet Take 1 tablet (325 mg total) by mouth daily. 05/13/21  Yes Weeks, Susan R, PA-C  Calcium Carbonate-Vitamin D (CALCIUM 600+D PO) Take 1 tablet by mouth daily.   Yes [provider]  metoprolol tartrate (LOPRESSOR) 25 MG tablet Take 1 tablet (25 mg total) by mouth 2 (two) times daily. 04/15/21 04/15/22 Yes Susan Hampshire, MD  NON FORMULARY Pt uses a cpap nightly   Yes [provider]  vitamin E 400 UNIT capsule Take 400 Units by mouth daily.    Yes [provider]      Review of Systems    She denies chest pain, palpitations,  dyspnea, pnd, orthopnea, n, v, dizziness, syncope, edema, weight gain, or early satiety.  All other systems reviewed and are otherwise negative except as noted above.    Physical Exam    VS:  BP 138/84 (BP Location: Left Arm, Patient Position: Sitting, Cuff Size: Normal)   Pulse 72   Ht '5\' 3"'$  (1.6 m)   Wt 172 lb (78 kg)   LMP 06/28/1982   BMI 30.47 kg/m  , BMI Body mass index is 30.47 kg/m.     GEN: Well nourished, well developed, in no acute distress. HEENT: normal. Neck: Supple, no JVD, carotid bruits, or masses. Cardiac: RRR, 2/6 systolic ejection murmur loudest at the upper sternal borders, no rubs or gallops.  No clubbing, cyanosis, edema.  Radials/PT 2+ and equal bilaterally.  Respiratory:  Respirations regular and unlabored, diminished breath sound in the left base otherwise clear to auscultation. GI: Soft, nontender, nondistended, BS + x 4. MS: no deformity or atrophy. Skin: warm and  dry, no rash. Neuro:  Strength and sensation are intact. Psych: Normal affect.  Accessory Clinical Findings    ECG personally reviewed by me today -regular sinus rhythm, 72, first-degree AV block, biatrial enlargement, anterolateral infarct, baseline artifact- no acute changes.  Lab Results  Component Value Date   WBC 4.5 07/08/2021   HGB 14.5 07/08/2021   HCT 45.1 07/08/2021   MCV 85.6 07/08/2021   PLT 267 07/08/2021   Lab Results  Component Value Date   CREATININE 0.88 09/24/2021   BUN 15 09/24/2021   NA 140 09/24/2021   K 4.1 09/24/2021   CL 102 09/24/2021   CO2 30 09/24/2021   Lab Results  Component Value Date   ALT 176 (H) 10/11/2021   AST 134 (H) 10/11/2021   ALKPHOS 159 (H) 10/11/2021   BILITOT 1.1 10/11/2021   Lab Results  Component Value Date   CHOL 147 11/30/2020   HDL 61 11/30/2020   LDLCALC 70 11/30/2020   LDLDIRECT 53.0 07/18/2021   TRIG 79 11/30/2020   CHOLHDL 2.4 11/30/2020    Lab Results  Component Value Date   HGBA1C 5.8 09/24/2021    Assessment  & Plan    1.  Coronary artery disease: Status post CABG x4 in January 2023.  Ms. Finkbiner has completed cardiac rehabilitation and is currently walking 30 to 40 minutes per day without symptoms or limitations.  She has noted significant improvement in activity tolerance since her bypass surgery.  She remains on aspirin, beta-blocker, and calcium channel blocker therapy.  Statin therapy is currently on hold in the setting of rising LFTs with plan for liver ultrasound tomorrow and follow-up with oncology in the setting of known liver lesion.  2.  Hyperlipidemia: LDL of 53 in March in the setting of rosuvastatin therapy, which has been on hold due to elevated LFTs as outlined above.  Plan for liver ultrasound and oncology follow-up.  If statin therapy no longer an option due to LFT abnormalities, will need to consider alternate such as bempedoic acid or Repatha.  3.  Left pleural effusion: Diminished breath sounds on examination though previous ultrasonic evaluation of the effusion in February 2023 showed only trace left pleural fluid.  She notes good activity tolerance and denies dyspnea on exertion.  She is scheduled for follow-up chest x-ray on Friday with CT surgical follow-up the same day.  4.  Essential hypertension: Blood pressure mildly elevated today at 138/84.  I encouraged her to check her blood pressure daily at home for the next 1 to 2 weeks and contact us with trends.  If she remains in the high 130s or above, we will plan to increase her amlodipine to 10 mg daily.  5.  Postoperative atrial fibrillation: Brief episode of A-fib postoperatively back in January.  She is in sinus rhythm and is no longer on amiodarone therapy.  She remains on beta-blocker and aspirin.  6.  Liver lesions/elevated LFTs: 2 hypervascular lesions previously noted on abdominal imaging.  Since her bypass surgery, LFTs have been rising.  We discontinued statin therapy in March.  Plan for follow-up ultrasound tomorrow and  oncology follow-up thereafter.  7.  Disposition: Follow-up in 3 to 4 months or sooner if necessary.  Patient to send blood pressure trends within the next 1 to 2 weeks.  Murray Hodgkins, NP 10/22/2021, 1:45 PM

## 2021-10-22 NOTE — Patient Instructions (Signed)
Medication Instructions:  No changes at this time.   *If you need a refill on your cardiac medications before your next appointment, please call your pharmacy*   Lab Work: None  If you have labs (blood work) drawn today and your tests are completely normal, you will receive your results only by: [place] (if you have MyChart) OR A paper copy in the mail If you have any lab test that is abnormal or we need to change your treatment, we will call you to review the results.   Testing/Procedures: None   Follow-Up: At [facility], you and your health needs are our priority.  As part of our continuing mission to provide you with exceptional heart care, we have created designated Provider Care Teams.  These Care Teams include your primary Cardiologist (physician) and Advanced Practice Providers (APPs -  Physician Assistants and Nurse Practitioners) who all work together to provide you with the care you need, when you need it.   Your next appointment:   3-4 month(s)  The format for your next appointment:   In Person  Provider:   [name], MD or Murray Hodgkins, NP          Important Information About Sugar

## 2021-10-23 ENCOUNTER — Other Ambulatory Visit: Payer: Self-pay | Admitting: Oncology

## 2021-10-23 ENCOUNTER — Ambulatory Visit
Admission: RE | Admit: 2021-10-23 | Discharge: 2021-10-23 | Disposition: A | Discharge status: Home / Self Care | Payer: BC Managed Care – PPO | Source: Ambulatory Visit | Attending: Oncology | Admitting: Oncology

## 2021-10-23 DIAGNOSIS — K769 Liver disease, unspecified: Secondary | ICD-10-CM

## 2021-10-23 MED ORDER — SULFUR HEXAFLUORIDE MICROSPH 60.7-25 MG IJ SUSR
5.0000 mL | Freq: Once | INTRAMUSCULAR | Status: AC | PRN
  Administered 2021-10-23: 5 mL via INTRAVENOUS

## 2021-10-24 ENCOUNTER — Other Ambulatory Visit: Payer: Self-pay | Admitting: Thoracic Surgery (Cardiothoracic Vascular Surgery)

## 2021-10-24 DIAGNOSIS — Z951 Presence of aortocoronary bypass graft: Secondary | ICD-10-CM

## 2021-10-25 ENCOUNTER — Ambulatory Visit (INDEPENDENT_AMBULATORY_CARE_PROVIDER_SITE_OTHER): Payer: BC Managed Care – PPO | Admitting: Thoracic Surgery (Cardiothoracic Vascular Surgery)

## 2021-10-25 ENCOUNTER — Ambulatory Visit
Admission: RE | Admit: 2021-10-25 | Discharge: 2021-10-25 | Disposition: A | Discharge status: Home / Self Care | Payer: BC Managed Care – PPO | Source: Ambulatory Visit | Attending: Thoracic Surgery (Cardiothoracic Vascular Surgery) | Admitting: Thoracic Surgery (Cardiothoracic Vascular Surgery)

## 2021-10-25 VITALS — BP 150/78 | HR 67 | Resp 20 | Ht 63.0 in | Wt 171.0 lb

## 2021-10-25 DIAGNOSIS — Z951 Presence of aortocoronary bypass graft: Secondary | ICD-10-CM

## 2021-11-08 ENCOUNTER — Telehealth: Payer: Self-pay | Admitting: Nurse Practitioner

## 2021-11-08 MED ORDER — AMLODIPINE BESYLATE 10 MG PO TABS: 10.0000 mg | ORAL_TABLET | Freq: Every day | ORAL | 3 refills | Status: DC

## 2021-11-08 NOTE — Telephone Encounter (Signed)
Pt c/o BP issue: STAT if pt c/o blurred vision, one-sided weakness or slurred speech  1. What are your last 5 BP readings?   7/7   122/66 7/6  147/74 7/3   127/65 7/2   140/74  6/30  146/76   2. Are you having any other symptoms (ex. Dizziness, headache, blurred vision, passed out)?   No  3. What is your BP issue?    Patient stated she is reporting her last 5 BP readings as requested by Dr. Sharolyn Douglas.

## 2021-11-08 NOTE — Telephone Encounter (Signed)
Reviewed recommendations from provider. She verbalized understanding of increase and had no further questions at this time.

## 2021-11-08 NOTE — Telephone Encounter (Signed)
Blood pressures remain quite variable though, she does seem to trend higher overall.  Recommend increasing amlodipine to 10 mg daily.

## 2021-11-12 ENCOUNTER — Telehealth: Payer: Self-pay | Admitting: Nurse Practitioner

## 2021-11-12 NOTE — Telephone Encounter (Signed)
Patient states she is returning a call from today.  

## 2021-11-12 NOTE — Telephone Encounter (Signed)
Spoke with patient and chart was reviewed and no documentation of a call from our office today. Patient then stated the message could have been from our previous conversation regarding her results. She was appreciative for returning the call and had no further questions at this time.

## 2021-11-14 ENCOUNTER — Telehealth: Payer: Self-pay | Admitting: Internal Medicine

## 2021-11-14 DIAGNOSIS — R945 Abnormal results of liver function studies: Secondary | ICD-10-CM

## 2021-11-14 NOTE — Telephone Encounter (Signed)
Ok to continue amlodipine.  This should not be interfering with her liver function tests.

## 2021-11-14 NOTE — Telephone Encounter (Signed)
S/w pt - advised you have discussed [place] and liver testing with Dr Tasia Catchings. Advised lesions appear to be stable/smaller. Advised of need to do further eval.  Pt was asked if she is taking another new medications/supplements and still off cholestrol med. Pt states the only change is that her Amlodipine was increased by Dr Sharolyn Douglas. She is now taking '10mg'$ . She is still holding cholesterol medication.  She started taking new dose of Amlodipine last Friday. Patient scheduled for 7/14 for labs.  S/[facility] - pt scheduled for first available - 9/28 at 930. But was advised I can fax [place] and most recent labs over and they will have it reviewed for sooner appointment.  [place] printed, along with most recent labs and faxed to [phone].

## 2021-11-14 NOTE — Telephone Encounter (Signed)
-----   Message from Earlie Server, MD sent at 11/14/2021  1:09 AM EDT ----- Regarding: RE: f/u liver function tests Her liver enhanced US showed stable or smaller lesion, favor benign etiology.  Wondering if the LFT is due to medication or other etiologies.   ----- Message ----- From: [name], MD Sent: 10/14/2021   1:01 PM EDT To: Earlie Server, MD Subject: f/u liver function tests                       Ms Son recently had follow labs.  Liver function tests continue to increase.  She is scheduled to have f/u ultrasound testing on her liver.  I just wanted you to be aware of her test results.  Thank you for seeing her.   Koleen Celia

## 2021-11-14 NOTE — Telephone Encounter (Signed)
Susan Schomer was recently found to have elevated LFTs.  She had seen Dr Tasia Catchings previously for f/u of liver lesions. I received a message from Dr Tasia Catchings that Susan Weeks's ultrasound and liver testing revealed the lesions in her liver to be stable or smaller.  DrYu messaged me to inform me of this.  So, we still do not know why liver tests are elevated and need to do some other evaluation/testing.  First, please schedule her to come in soon for f/u liver test and will recheck cbc at that time.  Need within the week.  I have ordered the labs. Confirm she is doing ok.  Also, she previoustly saw Dr Alice Reichert [facility]).  Please contact [facility] and let them know she needs a f/u appt  for abnormal liver function tests.  Would like as soon as possible.  Also, confirm she continues to remain off her cholesterol medication and that she is not taking any new supplements or anything that I am not aware of.

## 2021-11-15 ENCOUNTER — Other Ambulatory Visit (INDEPENDENT_AMBULATORY_CARE_PROVIDER_SITE_OTHER): Payer: BC Managed Care – PPO

## 2021-11-15 DIAGNOSIS — R945 Abnormal results of liver function studies: Secondary | ICD-10-CM

## 2021-11-15 LAB — PROTIME-INR
INR: 1 ratio (ref 0.8–1.0)
Prothrombin Time: 11 s (ref 9.6–13.1)

## 2021-11-15 LAB — CBC WITH DIFFERENTIAL/PLATELET
Basophils Absolute: 0 10*3/uL (ref 0.0–0.1)
Basophils Relative: 1.1 % (ref 0.0–3.0)
Eosinophils Absolute: 0 10*3/uL (ref 0.0–0.7)
Eosinophils Relative: 1.4 % (ref 0.0–5.0)
HCT: 44.8 % (ref 36.0–46.0)
Hemoglobin: 14.7 g/dL (ref 12.0–15.0)
Lymphocytes Relative: 32 % (ref 12.0–46.0)
Lymphs Abs: 1 10*3/uL (ref 0.7–4.0)
MCHC: 32.7 g/dL (ref 30.0–36.0)
MCV: 87 fl (ref 78.0–100.0)
Monocytes Absolute: 0.3 10*3/uL (ref 0.1–1.0)
Monocytes Relative: 8.3 % (ref 3.0–12.0)
Neutro Abs: 1.9 10*3/uL (ref 1.4–7.7)
Neutrophils Relative %: 57.2 % (ref 43.0–77.0)
Platelets: 201 10*3/uL (ref 150.0–400.0)
RBC: 5.15 Mil/uL — ABNORMAL HIGH (ref 3.87–5.11)
RDW: 15.9 % — ABNORMAL HIGH (ref 11.5–15.5)
WBC: 3.2 10*3/uL — ABNORMAL LOW (ref 4.0–10.5)

## 2021-11-15 LAB — HEPATIC FUNCTION PANEL
ALT: 165 U/L — ABNORMAL HIGH (ref 0–35)
AST: 111 U/L — ABNORMAL HIGH (ref 0–37)
Albumin: 4.2 g/dL (ref 3.5–5.2)
Alkaline Phosphatase: 162 U/L — ABNORMAL HIGH (ref 39–117)
Bilirubin, Direct: 0.1 mg/dL (ref 0.0–0.3)
Total Bilirubin: 0.7 mg/dL (ref 0.2–1.2)
Total Protein: 6.4 g/dL (ref 6.0–8.3)

## 2021-12-31 ENCOUNTER — Encounter: Payer: Self-pay | Admitting: Internal Medicine

## 2021-12-31 ENCOUNTER — Ambulatory Visit (INDEPENDENT_AMBULATORY_CARE_PROVIDER_SITE_OTHER): Payer: BC Managed Care – PPO | Admitting: Internal Medicine

## 2021-12-31 VITALS — BP 120/80 | HR 64 | Temp 97.7°F | Ht 63.0 in | Wt 170.8 lb

## 2021-12-31 DIAGNOSIS — I10 Essential (primary) hypertension: Secondary | ICD-10-CM | POA: Diagnosis not present

## 2021-12-31 DIAGNOSIS — I25119 Atherosclerotic heart disease of native coronary artery with unspecified angina pectoris: Secondary | ICD-10-CM | POA: Diagnosis not present

## 2021-12-31 DIAGNOSIS — Z136 Encounter for screening for cardiovascular disorders: Secondary | ICD-10-CM

## 2021-12-31 DIAGNOSIS — Z0000 Encounter for general adult medical examination without abnormal findings: Secondary | ICD-10-CM | POA: Diagnosis not present

## 2021-12-31 DIAGNOSIS — R945 Abnormal results of liver function studies: Secondary | ICD-10-CM

## 2021-12-31 DIAGNOSIS — R739 Hyperglycemia, unspecified: Secondary | ICD-10-CM

## 2021-12-31 DIAGNOSIS — D582 Other hemoglobinopathies: Secondary | ICD-10-CM

## 2021-12-31 DIAGNOSIS — E7800 Pure hypercholesterolemia, unspecified: Secondary | ICD-10-CM

## 2021-12-31 DIAGNOSIS — G4730 Sleep apnea, unspecified: Secondary | ICD-10-CM

## 2021-12-31 DIAGNOSIS — Z85038 Personal history of other malignant neoplasm of large intestine: Secondary | ICD-10-CM

## 2021-12-31 NOTE — Progress Notes (Signed)
[name]: ILYA ESS, female   DOB: July 18, 1950, 71 y.o.   MRN: [id number]   Subjective:    [name]: [name], female    DOB: Sep 24, 1950, 72 y.o.   MRN: [id number]   Patient here for  Chief Complaint  Patient presents with   Annual Exam    Mammogram due   .   HPI Here for physical exam. Has known heart disease, hypertension and hypercholesterolemia.  Saw cardiology - 10/25/21 - s/p CABG 08/8544 - complicated by paralyzed left hemidiaphragm.  S/p cardiac rehab. Continues to walk - 5 days per week.  Stable. Continue to monitor.  No chest pain.  Breathing stable.  No acid reflux.  No abdominal pain.  Bowels moving.    Past Medical History:  Diagnosis Date   Abnormal liver function    Colon cancer (HCC)    adenocarcinoma of the cecum s/p partial colon resection   Coronary artery disease    a. 03/2021 Cor CTA: sev LAD dzs; b. 04/2021 Cath: lM nl, LAD 111m(unable to wire), D1 60, LCX nl, OM3 60, LPAV 100, RCA 90p; c. 05/2021 CABG x 4: LIMA->LAD, VG->RPDA, VG->OM1, VG->OM3.   History of colon polyps    Hyperlipidemia    Hypertension    Post-operative Atrial Fibrillation ([place]    a. 05/2021 Brief PAF after CABG-->amio.   Sleep apnea 027/07/5007  Systolic murmur    a. 138/1829Echo: EF 60-65%, no rwma, GrI DD, RVSP 28.864mg. Mild-mod MR.   Past Surgical History:  Procedure Laterality Date   ABDOMINAL HYSTERECTOMY  1984   secondary to bleeding and fibroid tumors   APPENDECTOMY     COLONOSCOPY W/ POLYPECTOMY  2001   COLONOSCOPY WITH PROPOFOL N/A 07/28/2016   Procedure: COLONOSCOPY WITH PROPOFOL;  Surgeon: MaLollie SailsMD;  [facility];  Service: Endoscopy;  Laterality: N/A;   CORONARY ARTERY BYPASS GRAFT N/A 05/07/2021   Procedure: CORONARY ARTERY BYPASS GRAFTING (CABG) X 4, USING LEFT INTERNAL MAMMARY ARTERY, LEFT LEG GREATER SAPHENOUS VEIN HARVESTED ENDOSCOPICALLY;  Surgeon: LiLajuana MatteMD;  [facility];   Laterality: N/A;   CORONARY STENT INTERVENTION N/A 04/15/2021   Procedure: CORONARY STENT INTERVENTION;  Surgeon: ArWellington HampshireMD;  [facility];  Service: Cardiovascular;  Laterality: N/A;   DILATION AND CURETTAGE OF UTERUS  1967   ENDOVEIN HARVEST OF GREATER SAPHENOUS VEIN Left 05/07/2021   Procedure: ENDOVEIN HARVEST OF GREATER SAPHENOUS VEIN;  Surgeon: LiLajuana MatteMD;  [place] [facility]: [facility];  Laterality: Left;   IR THORACENTESIS ASP PLEURAL SPACE W/IMG GUIDE  05/10/2021   LEFT HEART CATH AND CORONARY ANGIOGRAPHY N/A 04/15/2021   Procedure: LEFT HEART CATH AND CORONARY ANGIOGRAPHY;  Surgeon: ArWellington HampshireMD;  [facility];  Service: Cardiovascular;  Laterality: N/A;   TEE WITHOUT CARDIOVERSION N/A 05/07/2021   Procedure: TRANSESOPHAGEAL ECHOCARDIOGRAM (TEE);  Surgeon: LiLajuana MatteMD;  [place] [facility]: [facility];  Laterality: N/A;   Family History  Problem Relation Age of Onset   Stroke Mother    Hypertension Mother    Cancer Father        Prostate    Hypertension Sister        x4   Heart attack Brother    Heart disease Brother        myocardial infarction   Hypertension Brother    Breast cancer  Neg Hx    Social History   Socioeconomic History   Marital status: Married    Spouse name: Eddie   Number of children: 1   Years of education: HS   Highest education level: Not on file  Occupational History    Employer: OTHER    Comment: [employer]  Tobacco Use   Smoking status: Former    Years: 10.00    Types: Cigarettes    Quit date: 1978    Years since quitting: 45.7   Smokeless tobacco: Never   Tobacco comments:    1 pack per week--only smoked on the weekends.   Vaping Use   Vaping Use: Never used  Substance and Sexual Activity   Alcohol use: No    Alcohol/week: 0.0 standard drinks of alcohol   Drug use: No   Sexual activity: Not on file  Other Topics  Concern   Not on file  Social History Narrative   Not on file   Social Determinants of Health   Financial Resource Strain: Not on file  Food Insecurity: Not on file  Transportation Needs: Not on file  Physical Activity: Not on file  Stress: Not on file  Social Connections: Not on file     Review of Systems  Constitutional:  Negative for appetite change and unexpected weight change.  HENT:  Negative for congestion, sinus pressure and sore throat.   Eyes:  Negative for pain and visual disturbance.  Respiratory:  Negative for cough, chest tightness and shortness of breath.   Cardiovascular:  Negative for chest pain, palpitations and leg swelling.  Gastrointestinal:  Negative for abdominal pain, diarrhea, nausea and vomiting.  Genitourinary:  Negative for difficulty urinating and dysuria.  Musculoskeletal:  Negative for joint swelling and myalgias.  Skin:  Negative for color change and rash.  Neurological:  Negative for dizziness, light-headedness and headaches.  Hematological:  Negative for adenopathy. Does not bruise/bleed easily.  Psychiatric/Behavioral:  Negative for agitation and dysphoric mood.        Objective:     BP 120/80 (BP Location: Left Arm, Patient Position: Sitting, Cuff Size: Normal)   Pulse 64   Temp 97.7 F (36.5 C) (Oral)   Ht 5' 3"  (1.6 m)   Wt 170 lb 12.8 oz (77.5 kg)   LMP 06/28/1982   SpO2 96%   BMI 30.26 kg/m  Wt Readings from Last 3 Encounters:  12/31/21 170 lb 12.8 oz (77.5 kg)  10/25/21 171 lb (77.6 kg)  10/22/21 172 lb (78 kg)    Physical Exam Vitals reviewed.  Constitutional:      General: She is not in acute distress.    Appearance: Normal appearance. She is well-developed.  HENT:     Head: Normocephalic and atraumatic.     Right Ear: External ear normal.     Left Ear: External ear normal.  Eyes:     General: No scleral icterus.       Right eye: No discharge.        Left eye: No discharge.     Conjunctiva/sclera: Conjunctivae  normal.  Neck:     Thyroid: No thyromegaly.  Cardiovascular:     Rate and Rhythm: Normal rate and regular rhythm.  Pulmonary:     Effort: No tachypnea, accessory muscle usage or respiratory distress.     Breath sounds: Normal breath sounds. No decreased breath sounds or wheezing.  Chest:  Breasts:    Right: No inverted nipple, mass, nipple discharge or tenderness (no axillary adenopathy).  Left: No inverted nipple, mass, nipple discharge or tenderness (no axilarry adenopathy).  Abdominal:     General: Bowel sounds are normal.     Palpations: Abdomen is soft.     Tenderness: There is no abdominal tenderness.  Musculoskeletal:        General: No swelling or tenderness.     Cervical back: Neck supple.  Lymphadenopathy:     Cervical: No cervical adenopathy.  Skin:    Findings: No erythema or rash.  Neurological:     Mental Status: She is alert and oriented to person, place, and time.  Psychiatric:        Mood and Affect: Mood normal.        Behavior: Behavior normal.      Outpatient Encounter Medications as of 12/31/2021  Medication Sig   acetaminophen (TYLENOL) 500 MG tablet Take 500 mg by mouth every 6 (six) hours as needed for moderate pain.   amLODipine (NORVASC) 10 MG tablet Take 1 tablet (10 mg total) by mouth daily.   aspirin EC 325 MG EC tablet Take 1 tablet (325 mg total) by mouth daily.   Calcium Carbonate-Vitamin D (CALCIUM 600+D PO) Take 1 tablet by mouth daily.   metoprolol tartrate (LOPRESSOR) 25 MG tablet Take 1 tablet (25 mg total) by mouth 2 (two) times daily.   NON FORMULARY Pt uses a cpap nightly   vitamin E 400 UNIT capsule Take 400 Units by mouth daily.    No facility-administered encounter medications on file as of 12/31/2021.     Lab Results  Component Value Date   WBC 3.2 (L) 11/15/2021   HGB 14.7 11/15/2021   HCT 44.8 11/15/2021   PLT 201.0 11/15/2021   GLUCOSE 87 09/24/2021   CHOL 147 11/30/2020   TRIG 79 11/30/2020   HDL 61 11/30/2020    LDLDIRECT 53.0 07/18/2021   LDLCALC 70 11/30/2020   ALT 165 (H) 11/15/2021   AST 111 (H) 11/15/2021   NA 140 09/24/2021   K 4.1 09/24/2021   CL 102 09/24/2021   CREATININE 0.88 09/24/2021   BUN 15 09/24/2021   CO2 30 09/24/2021   TSH 1.18 09/24/2021   INR 1.0 11/15/2021   HGBA1C 5.8 09/24/2021    DG Chest 2 View  Result Date: 10/25/2021 CLINICAL DATA:  Status post CABG. EXAM: CHEST - 2 VIEW COMPARISON:  July 26, 2021 FINDINGS: The patient is status post CABG. Sternotomy wires are intact. The cardiomediastinal silhouette is stable. No pneumothorax. Mild atelectasis at the right base. The right lung is otherwise clear. Elevation of the left hemidiaphragm. The previously identified left pleural effusion has almost completely resolved. A tiny remaining effusion is not excluded. The left lung is otherwise clear. No other acute abnormalities. IMPRESSION: Minimal haziness at the left base could represent a tiny remaining effusion, significantly improved in the interval. The left hemidiaphragm remains elevated. Minimal atelectasis in the bases. No other significant abnormalities. Electronically Signed   By: Dorise Bullion III M.D.   On: 10/25/2021 09:21       Assessment & Plan:   Problem List Items Addressed This Visit     Abnormal liver function    Off statin due to elevated liver function tests.  Being evaluated by oncology.  Dr Tasia Catchings - ordered f/u abdominal ultrasound - liver mass unchanged/smaller - favored benign lesion.  Needs f/u liver function tests/GI follow up.  Has f/u with oncology arranged.       Relevant Orders   Hepatic function panel  Coronary artery disease with angina pectoris ([place])    Doing well on amlodipine, metoprolol.  Off statin due to liver enzyme elevation. No pain.  Follow.       Elevated hemoglobin ([place])    Followed by hematology as outlined.  Continue cpap.       Essential hypertension, benign    Had swelling on lisinopril.  On amlodipine. Blood pressure  as outlined..  Follow pressures.  Follow metabolic panel.       Relevant Orders   Basic metabolic panel   Health care maintenance    Physical today 12/30/21.  Mammogram 12/20/20 - Birads I.  Colonoscopy 10/2019 - recommended f/u in 3 years.        History of colon cancer    Colonoscopy 10/2019 - recommended f/u in 3 years.       Hyperglycemia    Low carb diet and exercise.  Follow met b and a1c.       Relevant Orders   Hemoglobin A1c   Pure hypercholesterolemia    Continues on crestor.  Low cholesterol diet and exercise.  Follow lipid panel and liver function tests.   Lab Results  Component Value Date   CHOL 147 11/30/2020   HDL 61 11/30/2020   LDLCALC 70 11/30/2020   LDLDIRECT 53.0 07/18/2021   TRIG 79 11/30/2020   CHOLHDL 2.4 11/30/2020       Relevant Orders   Lipid panel   Sleep apnea    Continue cpap.      Other Visit Diagnoses     Routine general medical examination at a health care facility    -  Primary   Screening for heart disease            [name], MD

## 2021-12-31 NOTE — Assessment & Plan Note (Signed)
Physical today 12/30/21.  Mammogram 12/20/20 - Birads I.  Colonoscopy 10/2019 - recommended f/u in 3 years.

## 2022-01-05 ENCOUNTER — Encounter: Payer: Self-pay | Admitting: Internal Medicine

## 2022-01-05 NOTE — Assessment & Plan Note (Signed)
Had swelling on lisinopril.  On amlodipine. Blood pressure as outlined..  Follow pressures.  Follow metabolic panel.  

## 2022-01-05 NOTE — Assessment & Plan Note (Signed)
Colonoscopy 10/2019 - recommended f/u in 3 years.  

## 2022-01-05 NOTE — Assessment & Plan Note (Signed)
Continues on crestor.  Low cholesterol diet and exercise.  Follow lipid panel and liver function tests.   Lab Results  Component Value Date   CHOL 147 11/30/2020   HDL 61 11/30/2020   LDLCALC 70 11/30/2020   LDLDIRECT 53.0 07/18/2021   TRIG 79 11/30/2020   CHOLHDL 2.4 11/30/2020

## 2022-01-05 NOTE — Assessment & Plan Note (Signed)
Followed by hematology as outlined.  Continue cpap.  

## 2022-01-05 NOTE — Assessment & Plan Note (Signed)
Low carb diet and exercise.  Follow met b and a1c.  

## 2022-01-05 NOTE — Assessment & Plan Note (Addendum)
Off statin due to elevated liver function tests.  Being evaluated by oncology.  Dr Tasia Catchings - ordered f/u abdominal ultrasound - liver mass unchanged/smaller - favored benign lesion.  Needs f/u liver function tests/GI follow up.  Has f/u with oncology arranged.

## 2022-01-05 NOTE — Assessment & Plan Note (Signed)
Doing well on amlodipine, metoprolol.  Off statin due to liver enzyme elevation. No pain.  Follow.

## 2022-01-05 NOTE — Assessment & Plan Note (Signed)
Continue cpap.  

## 2022-01-09 ENCOUNTER — Ambulatory Visit: Payer: BC Managed Care – PPO

## 2022-01-09 ENCOUNTER — Inpatient Hospital Stay: Payer: BC Managed Care – PPO | Attending: Oncology

## 2022-01-09 DIAGNOSIS — G4730 Sleep apnea, unspecified: Secondary | ICD-10-CM | POA: Insufficient documentation

## 2022-01-09 DIAGNOSIS — K769 Liver disease, unspecified: Secondary | ICD-10-CM | POA: Diagnosis not present

## 2022-01-09 DIAGNOSIS — I480 Paroxysmal atrial fibrillation: Secondary | ICD-10-CM | POA: Diagnosis not present

## 2022-01-09 DIAGNOSIS — Z7982 Long term (current) use of aspirin: Secondary | ICD-10-CM | POA: Diagnosis not present

## 2022-01-09 DIAGNOSIS — Z85038 Personal history of other malignant neoplasm of large intestine: Secondary | ICD-10-CM | POA: Insufficient documentation

## 2022-01-09 DIAGNOSIS — Z79899 Other long term (current) drug therapy: Secondary | ICD-10-CM | POA: Diagnosis not present

## 2022-01-09 DIAGNOSIS — Z955 Presence of coronary angioplasty implant and graft: Secondary | ICD-10-CM | POA: Insufficient documentation

## 2022-01-09 DIAGNOSIS — Z87891 Personal history of nicotine dependence: Secondary | ICD-10-CM | POA: Diagnosis not present

## 2022-01-09 DIAGNOSIS — D751 Secondary polycythemia: Secondary | ICD-10-CM | POA: Diagnosis present

## 2022-01-09 DIAGNOSIS — R970 Elevated carcinoembryonic antigen [CEA]: Secondary | ICD-10-CM | POA: Insufficient documentation

## 2022-01-09 LAB — COMPREHENSIVE METABOLIC PANEL
ALT: 116 U/L — ABNORMAL HIGH (ref 0–44)
AST: 72 U/L — ABNORMAL HIGH (ref 15–41)
Albumin: 4 g/dL (ref 3.5–5.0)
Alkaline Phosphatase: 165 U/L — ABNORMAL HIGH (ref 38–126)
Anion gap: 6 (ref 5–15)
BUN: 19 mg/dL (ref 8–23)
CO2: 28 mmol/L (ref 22–32)
Calcium: 9 mg/dL (ref 8.9–10.3)
Chloride: 106 mmol/L (ref 98–111)
Creatinine, Ser: 0.86 mg/dL (ref 0.44–1.00)
GFR, Estimated: >60 mL/min (ref >60)
Glucose, Bld: 103 mg/dL — ABNORMAL HIGH (ref 70–99)
Potassium: 4 mmol/L (ref 3.5–5.1)
Sodium: 140 mmol/L (ref 135–145)
Total Bilirubin: 0.5 mg/dL (ref 0.3–1.2)
Total Protein: 6.9 g/dL (ref 6.5–8.1)

## 2022-01-09 LAB — CBC WITH DIFFERENTIAL/PLATELET
Abs Immature Granulocytes: 0.01 10*3/uL (ref 0.00–0.07)
Basophils Absolute: 0 10*3/uL (ref 0.0–0.1)
Basophils Relative: 1 %
Eosinophils Absolute: 0.1 10*3/uL (ref 0.0–0.5)
Eosinophils Relative: 2 %
HCT: 46.9 % — ABNORMAL HIGH (ref 36.0–46.0)
Hemoglobin: 15.9 g/dL — ABNORMAL HIGH (ref 12.0–15.0)
Immature Granulocytes: 0 %
Lymphocytes Relative: 33 %
Lymphs Abs: 1.5 10*3/uL (ref 0.7–4.0)
MCH: 29.6 pg (ref 26.0–34.0)
MCHC: 33.9 g/dL (ref 30.0–36.0)
MCV: 87.2 fL (ref 80.0–100.0)
Monocytes Absolute: 0.3 10*3/uL (ref 0.1–1.0)
Monocytes Relative: 8 %
Neutro Abs: 2.5 10*3/uL (ref 1.7–7.7)
Neutrophils Relative %: 56 %
Platelets: 207 10*3/uL (ref 150–400)
RBC: 5.38 MIL/uL — ABNORMAL HIGH (ref 3.87–5.11)
RDW: 13.2 % (ref 11.5–15.5)
WBC: 4.4 10*3/uL (ref 4.0–10.5)
nRBC: 0 % (ref 0.0–0.2)

## 2022-01-10 LAB — CEA: CEA: 5.9 ng/mL — ABNORMAL HIGH (ref 0.0–4.7)

## 2022-01-14 ENCOUNTER — Inpatient Hospital Stay (HOSPITAL_BASED_OUTPATIENT_CLINIC_OR_DEPARTMENT_OTHER): Payer: BC Managed Care – PPO | Admitting: Oncology

## 2022-01-14 ENCOUNTER — Encounter: Payer: Self-pay | Admitting: Oncology

## 2022-01-14 VITALS — BP 141/63 | HR 72 | Resp 18 | Ht 63.0 in | Wt 172.0 lb

## 2022-01-14 DIAGNOSIS — Z85038 Personal history of other malignant neoplasm of large intestine: Secondary | ICD-10-CM

## 2022-01-14 DIAGNOSIS — K769 Liver disease, unspecified: Secondary | ICD-10-CM | POA: Diagnosis not present

## 2022-01-14 DIAGNOSIS — D751 Secondary polycythemia: Secondary | ICD-10-CM | POA: Diagnosis not present

## 2022-01-14 NOTE — Progress Notes (Signed)
Hematology/Oncology Progress note [phone] [phone]      Patient Care Team: [name], MD as PCP - General (Internal Medicine) [name], MD as PCP - Cardiology (Cardiology)  REFERRING PROVIDER: [name], MD  REASON FOR VISIT Follow up for treatment of erythrocytosis, liver lesion and adnexal lesion  HISTORY OF PRESENTING ILLNESS:  Susan Weeks is a  71 y.o.  female with PMH listed below who was referred to me for evaluation of elevated hemoglobin.  Recent hemoglobin 16.2, hct 47.7, reviewed her previous labs, hemoglobin has been chronically high since 2017, ranging from 15.1 to 16.2.   Pertinent Oncology Histroy  Previous history of carcinoma of colon status post resection, has an intermittent rising CEA.  Status post resection, no evidence of malignancy.-2011 Patient had exploratory laparotomy because of abnormal PET scan following rising CEA and was negative for any malignancy. CEA  remains stable in the range of 5.0   #Erythrocytosis was thought to be secondary, negative for Jak 2 mutation with reflex, BCR ABL mutation negative. # Sleep Apnea, patient establish care with Dr. Hector Shade.   on CPAP machine.  # 03/19/2020, ultrasound abdomen complete showed indeterminate lesions in the dome of liver.  Recommend MRI. 04/04/2020, patient had MRI abdomen with and without contrast Focal hepatic lesions [right dome2.3 x 1.9 cm, left lateral segment ] and in the right and left hepatic lobes, suspicious for metastatic disease. There is also a potential solid right adnexal lesion measuring 2.6 x 2.4 cm.  Potential ovarian neoplasm.  04/15/2020 US pelvis showed  Questionable visualization of a tiny RIGHT ovary containing 6 mm and 5 mm cysts; no follow-up imaging recommended. Questionable 3.4 cm diameter shadowing mass in RIGHT pelvis versus artifact related to stool within the colon; recommend dedicated CT imaging of the pelvis with IV  contrast to assess.  04/24/2020 PET scan showed the liver lesions are not hypermetabolic.    82/50/0370 Liver lesion was further evaluated by contrast enhanced [place] and Hypoechoic mass in the dome of the right lobe of the liver measuring up to 3.9 cm which demonstrates very early, flash filling diffuse enhancement without evidence of washout. These findings are most compatible with a flash filling hemangioma. Biopsy was not proceeded.   She had CABG x 4 on 05/07/21, hospitalization was complicated by Atrial fibrilliation.   INTERVAL HISTORY Susan Weeks is a 71 y.o. female who has above history reviewed by me today presents for follow-up visit for management of erythrocytosis, history of colon cancer. Patient reports feeling well.  No new complaints. Denies any change of bowel habits, blood in his stool, abdominal pain.   Review of Systems  Constitutional:  Negative for chills, fever, malaise/fatigue and weight loss.  HENT:  Negative for sore throat.   Eyes:  Negative for redness.  Respiratory:  Negative for cough, shortness of breath and wheezing.   Cardiovascular:  Negative for chest pain, palpitations and leg swelling.  Gastrointestinal:  Negative for abdominal pain, blood in stool, nausea and vomiting.  Genitourinary:  Negative for dysuria.  Musculoskeletal:  Negative for myalgias.  Skin:  Negative for rash.  Neurological:  Negative for dizziness, tingling and tremors.  Endo/Heme/Allergies:  Does not bruise/bleed easily.  Psychiatric/Behavioral:  Negative for hallucinations.     MEDICAL HISTORY:  Past Medical History:  Diagnosis Date   Abnormal liver function    Colon cancer ([place])    adenocarcinoma of the cecum s/p partial colon resection   Coronary artery disease  a. 03/2021 Cor CTA: sev LAD dzs; b. 04/2021 Cath: lM nl, LAD 134m(unable to wire), D1 60, LCX nl, OM3 60, LPAV 100, RCA 90p; c. 05/2021 CABG x 4: LIMA->LAD, VG->RPDA, VG->OM1, VG->OM3.   History of colon polyps     Hyperlipidemia    Hypertension    Post-operative Atrial Fibrillation ([place]    a. 05/2021 Brief PAF after CABG-->amio.   Sleep apnea 013/12/6576  Systolic murmur    a. 146/9629Echo: EF 60-65%, no rwma, GrI DD, RVSP 28.830mg. Mild-mod MR.    SURGICAL HISTORY: Past Surgical History:  Procedure Laterality Date   ABDOMINAL HYSTERECTOMY  1984   secondary to bleeding and fibroid tumors   APPENDECTOMY     COLONOSCOPY W/ POLYPECTOMY  2001   COLONOSCOPY WITH PROPOFOL N/A 07/28/2016   Procedure: COLONOSCOPY WITH PROPOFOL;  Surgeon: MaLollie SailsMD;  [facility];  Service: Endoscopy;  Laterality: N/A;   CORONARY ARTERY BYPASS GRAFT N/A 05/07/2021   Procedure: CORONARY ARTERY BYPASS GRAFTING (CABG) X 4, USING LEFT INTERNAL MAMMARY ARTERY, LEFT LEG GREATER SAPHENOUS VEIN HARVESTED ENDOSCOPICALLY;  Surgeon: LiLajuana MatteMD;  [place]: [facility]: Open Heart Surgery;  Laterality: N/A;   CORONARY STENT INTERVENTION N/A 04/15/2021   Procedure: CORONARY STENT INTERVENTION;  Surgeon: ArWellington HampshireMD;  [facility];  Service: Cardiovascular;  Laterality: N/A;   DILATION AND CURETTAGE OF UTERUS  1967   ENDOVEIN HARVEST OF GREATER SAPHENOUS VEIN Left 05/07/2021   Procedure: ENDOVEIN HARVEST OF GREATER SAPHENOUS VEIN;  Surgeon: LiLajuana MatteMD;  [place] [facility] [place] [facility]: Open [facility] Surgery;  Laterality: Left;   IR THORACENTESIS ASP PLEURAL SPACE W/IMG GUIDE  05/10/2021   LEFT HEART CATH AND CORONARY ANGIOGRAPHY N/A 04/15/2021   Procedure: LEFT HEART CATH AND CORONARY ANGIOGRAPHY;  Surgeon: ArWellington HampshireMD;  [place] [facility];  Service: Cardiovascular;  Laterality: N/A;   TEE WITHOUT CARDIOVERSION N/A 05/07/2021   Procedure: TRANSESOPHAGEAL ECHOCARDIOGRAM (TEE);  Surgeon: LiLajuana MatteMD;  [place] [facility] [facility]: Open [facility] Surgery;  Laterality: N/A;    SOCIAL HISTORY: Social History   Socioeconomic History    Marital status: Married    Spouse name: Eddie   Number of children: 1   Years of education: HS   Highest education level: Not on file  Occupational History    Employer: OTHER    Comment: [employer]  Tobacco Use   Smoking status: Former    Years: 10.00    Types: Cigarettes    Quit date: 1978    Years since quitting: 45.7   Smokeless tobacco: Never   Tobacco comments:    1 pack per week--only smoked on the weekends.   Vaping Use   Vaping Use: Never used  Substance and Sexual Activity   Alcohol use: No    Alcohol/week: 0.0 standard drinks of alcohol   Drug use: No   Sexual activity: Not on file  Other Topics Concern   Not on file  Social History Narrative   Not on file   Social Determinants of Health   Financial Resource Strain: Not on file  Food Insecurity: Not on file  Transportation Needs: Not on file  Physical Activity: Not on file  Stress: Not on file  Social Connections: Not on file  Intimate Partner Violence: Not on file    FAMILY HISTORY: Family History  Problem Relation Age of Onset   Stroke Mother  Hypertension Mother    Cancer Father        Prostate    Hypertension Sister        x4   Heart attack Brother    Heart disease Brother        myocardial infarction   Hypertension Brother    Breast cancer Neg Hx     ALLERGIES:  is allergic to tape, lisinopril, and z-pak [azithromycin].  MEDICATIONS:  Current Outpatient Medications  Medication Sig Dispense Refill   acetaminophen (TYLENOL) 500 MG tablet Take 500 mg by mouth every 6 (six) hours as needed for moderate pain.     amLODipine (NORVASC) 10 MG tablet Take 1 tablet (10 mg total) by mouth daily. 90 tablet 3   aspirin EC 325 MG EC tablet Take 1 tablet (325 mg total) by mouth daily.     Calcium Carbonate-Vitamin D (CALCIUM 600+D PO) Take 1 tablet by mouth daily.     metoprolol tartrate (LOPRESSOR) 25 MG tablet Take 1 tablet (25 mg total) by mouth 2 (two) times daily. 60 tablet 11   NON  FORMULARY Pt uses a cpap nightly     vitamin E 400 UNIT capsule Take 400 Units by mouth daily.      No current facility-administered medications for this visit.     PHYSICAL EXAMINATION: ECOG PERFORMANCE STATUS: 0 - Asymptomatic Vitals:   01/14/22 1344 01/14/22 1352  BP:  (!) 141/63  Pulse:  72  Resp: 18 18  SpO2:  100%   Filed Weights   01/14/22 1344  Weight: 172 lb (78 kg)    Physical Exam Constitutional:      General: She is not in acute distress. HENT:     Head: Normocephalic and atraumatic.  Eyes:     General: No scleral icterus. Cardiovascular:     Rate and Rhythm: Normal rate and regular rhythm.     Heart sounds: Normal heart sounds.  Pulmonary:     Effort: Pulmonary effort is normal. No respiratory distress.     Breath sounds: No wheezing.  Abdominal:     General: Bowel sounds are normal. There is no distension.     Palpations: Abdomen is soft.  Musculoskeletal:        General: No deformity. Normal range of motion.     Cervical back: Normal range of motion and neck supple.  Skin:    General: Skin is warm and dry.     Findings: No erythema or rash.  Neurological:     Mental Status: She is alert and oriented to person, place, and time. Mental status is at baseline.     Cranial Nerves: No cranial nerve deficit.     Coordination: Coordination normal.  Psychiatric:        Mood and Affect: Mood normal.      LABORATORY DATA:  I have reviewed the data as listed Lab Results  Component Value Date   WBC 4.4 01/09/2022   HGB 15.9 (H) 01/09/2022   HCT 46.9 (H) 01/09/2022   MCV 87.2 01/09/2022   PLT 207 01/09/2022   Recent Labs    07/08/21 1126 07/18/21 0851 07/23/21 0915 09/24/21 0845 10/11/21 1013 11/15/21 1045 01/09/22 1408  NA 137 140  --  140  --   --  140  K 3.8 4.2  --  4.1  --   --  4.0  CL 103 105  --  102  --   --  106  CO2 25 28  --  30  --   --  28  GLUCOSE 113* 97  --  87  --   --  103*  BUN 18 21  --  15  --   --  19  CREATININE  1.19* 1.11*  --  0.88  --   --  0.86  CALCIUM 9.2 9.4  --  9.8  --   --  9.0  GFRNONAA 49* 53*  --   --   --   --  >60  PROT 7.4 7.9   < > 6.9 6.4 6.4 6.9  ALBUMIN 4.2 4.3   < > 4.3 4.0 4.2 4.0  AST 25 70*   < > 48* 134* 111* 72*  ALT 30 63*   < > 119* 176* 165* 116*  ALKPHOS 65 68   < > 130* 159* 162* 165*  BILITOT 0.5 0.7   < > 1.2 1.1 0.7 0.5  BILIDIR  --   --    < > 0.2 0.2 0.1  --    < > = values in this interval not displayed.        ASSESSMENT & PLAN:  1. Erythrocytosis   2. Liver lesion   3. History of colon cancer    # liver lesions she has had extensive work up including No hypermetabolism on PET, Enhanced US showed likely benign hemangioma and biopsy was not proceeded. 10/24/2021 enhanced US showed right hepatic dome solid mass is smaller comparing to prior, similar contrast-enhancement characteristics.  Favor benign etiology.  Left hepatic mass appears similar.  Plan to repeat enhanced ultrasound in 1 year.  #History of colon cancer, elevated CEA, check CT chest abdomen pelvis. # Right adnexal lesion, has had negative work up so far. Negative on PET  #Secondary erythrocytosis secondary to sleep apnea, labs are reviewed.  She is compliant with CPAP machine.  No need for phlebotomy at this point.  Continue observation.   Orders Placed This Encounter  Procedures   CT CHEST ABDOMEN PELVIS W CONTRAST    Standing Status:   Future    Standing Expiration Date:   01/15/2023    Order Specific Question:   Preferred imaging location?    Answer:   [facility]    Order Specific Question:   Is Oral Contrast requested for this exam?    Answer:   Yes, Per Radiology protocol   CBC with Differential/Platelet    Standing Status:   Future    Standing Expiration Date:   01/15/2023   Comprehensive metabolic panel    Standing Status:   Future    Standing Expiration Date:   01/14/2023   CEA    Standing Status:   Future    Standing Expiration Date:   01/14/2023   Follow up May  2024, labs prior to MD.  Earlie Server, MD, PhD Hematology Oncology  01/14/2022

## 2022-01-15 ENCOUNTER — Other Ambulatory Visit: Payer: Self-pay

## 2022-01-15 DIAGNOSIS — R970 Elevated carcinoembryonic antigen [CEA]: Secondary | ICD-10-CM

## 2022-01-15 DIAGNOSIS — Z85038 Personal history of other malignant neoplasm of large intestine: Secondary | ICD-10-CM

## 2022-01-21 ENCOUNTER — Other Ambulatory Visit (INDEPENDENT_AMBULATORY_CARE_PROVIDER_SITE_OTHER): Payer: BC Managed Care – PPO

## 2022-01-21 DIAGNOSIS — R739 Hyperglycemia, unspecified: Secondary | ICD-10-CM

## 2022-01-21 DIAGNOSIS — E7800 Pure hypercholesterolemia, unspecified: Secondary | ICD-10-CM | POA: Diagnosis not present

## 2022-01-21 DIAGNOSIS — I10 Essential (primary) hypertension: Secondary | ICD-10-CM

## 2022-01-21 DIAGNOSIS — R945 Abnormal results of liver function studies: Secondary | ICD-10-CM

## 2022-01-21 LAB — HEPATIC FUNCTION PANEL
ALT: 67 U/L — ABNORMAL HIGH (ref 0–35)
AST: 33 U/L (ref 0–37)
Albumin: 4 g/dL (ref 3.5–5.2)
Alkaline Phosphatase: 129 U/L — ABNORMAL HIGH (ref 39–117)
Bilirubin, Direct: 0.1 mg/dL (ref 0.0–0.3)
Total Bilirubin: 0.6 mg/dL (ref 0.2–1.2)
Total Protein: 7 g/dL (ref 6.0–8.3)

## 2022-01-21 LAB — LIPID PANEL
Cholesterol: 202 mg/dL — ABNORMAL HIGH (ref 0–200)
HDL: 71.1 mg/dL (ref >39.00)
LDL Cholesterol: 119 mg/dL — ABNORMAL HIGH (ref 0–99)
NonHDL: 130.46
Total CHOL/HDL Ratio: 3
Triglycerides: 57 mg/dL (ref 0.0–149.0)
VLDL: 11.4 mg/dL (ref 0.0–40.0)

## 2022-01-21 LAB — HEMOGLOBIN A1C: Hgb A1c MFr Bld: 5.9 % (ref 4.6–6.5)

## 2022-01-21 LAB — BASIC METABOLIC PANEL
BUN: 17 mg/dL (ref 6–23)
CO2: 29 mEq/L (ref 19–32)
Calcium: 8.7 mg/dL (ref 8.4–10.5)
Chloride: 102 mEq/L (ref 96–112)
Creatinine, Ser: 0.81 mg/dL (ref 0.40–1.20)
GFR: 72.64 mL/min (ref >60.00)
Glucose, Bld: 95 mg/dL (ref 70–99)
Potassium: 3.7 mEq/L (ref 3.5–5.1)
Sodium: 137 mEq/L (ref 135–145)

## 2022-01-22 ENCOUNTER — Telehealth: Payer: Self-pay

## 2022-01-22 NOTE — Telephone Encounter (Signed)
See result note.  

## 2022-01-22 NOTE — Telephone Encounter (Signed)
LMTCB for lab results.  

## 2022-01-22 NOTE — Telephone Encounter (Signed)
Patient states she is returning our call regarding her results.

## 2022-01-24 ENCOUNTER — Ambulatory Visit: Payer: BC Managed Care – PPO

## 2022-02-04 ENCOUNTER — Ambulatory Visit
Admission: RE | Admit: 2022-02-04 | Discharge: 2022-02-04 | Disposition: A | Discharge status: Home / Self Care | Payer: BC Managed Care – PPO | Source: Ambulatory Visit | Attending: Oncology | Admitting: Oncology

## 2022-02-04 DIAGNOSIS — R970 Elevated carcinoembryonic antigen [CEA]: Secondary | ICD-10-CM | POA: Diagnosis present

## 2022-02-04 DIAGNOSIS — Z85038 Personal history of other malignant neoplasm of large intestine: Secondary | ICD-10-CM | POA: Insufficient documentation

## 2022-02-04 MED ORDER — IOHEXOL 300 MG/ML  SOLN
100.0000 mL | Freq: Once | INTRAMUSCULAR | Status: AC | PRN
  Administered 2022-02-04: 100 mL via INTRAVENOUS

## 2022-02-28 ENCOUNTER — Ambulatory Visit: Payer: BC Managed Care – PPO | Admitting: Nurse Practitioner

## 2022-02-28 ENCOUNTER — Ambulatory Visit: Payer: BC Managed Care – PPO | Attending: Nurse Practitioner | Admitting: Nurse Practitioner

## 2022-02-28 ENCOUNTER — Encounter: Payer: Self-pay | Admitting: Nurse Practitioner

## 2022-02-28 VITALS — BP 120/68 | HR 71 | Ht 63.0 in | Wt 173.0 lb

## 2022-02-28 DIAGNOSIS — I2510 Atherosclerotic heart disease of native coronary artery without angina pectoris: Secondary | ICD-10-CM | POA: Diagnosis not present

## 2022-02-28 DIAGNOSIS — R945 Abnormal results of liver function studies: Secondary | ICD-10-CM | POA: Diagnosis not present

## 2022-02-28 DIAGNOSIS — I10 Essential (primary) hypertension: Secondary | ICD-10-CM

## 2022-02-28 DIAGNOSIS — I25119 Atherosclerotic heart disease of native coronary artery with unspecified angina pectoris: Secondary | ICD-10-CM

## 2022-02-28 DIAGNOSIS — E785 Hyperlipidemia, unspecified: Secondary | ICD-10-CM | POA: Diagnosis not present

## 2022-02-28 DIAGNOSIS — I480 Paroxysmal atrial fibrillation: Secondary | ICD-10-CM

## 2022-02-28 MED ORDER — REPATHA SURECLICK 140 MG/ML ~~LOC~~ SOAJ: 140.0000 mg | SUBCUTANEOUS | 3 refills | Status: DC

## 2022-02-28 NOTE — Progress Notes (Signed)
Office Visit    Patient Name: Susan Weeks Date of Encounter: 02/28/2022  Primary Care Provider:  [name], MD Primary Cardiologist:  Susan Sacramento, MD  Chief Complaint    71 year old female with history of CAD status post CABG, hyperlipidemia, hypertension, erythrocytosis, sleep apnea, and colon cancer, who presents for follow-up related to CAD.  Past Medical History    Past Medical History:  Diagnosis Date   Abnormal liver function    Colon cancer ([place])    adenocarcinoma of the cecum s/p partial colon resection   Coronary artery disease    a. 03/2021 Cor CTA: sev LAD dzs; b. 04/2021 Cath: LM nl, LAD 162m(unable to wire), D1 60, LCX nl, OM3 60, LPAV 100, RCA 90p; c. 05/2021 CABG x 4: LIMA->LAD, VG->RPDA, VG->OM1, VG->OM3.   History of colon polyps    Hyperlipidemia    Hypertension    Post-[place]rial Fibrillation ([place]    a. 05/2021 Brief PAF after CABG-->amio.   Sleep apnea 082/99/3716  Systolic murmur    a. 196/7893Echo: EF 60-65%, no rwma, GrI DD, RVSP 28.873mg. Mild-mod MR.   Past Surgical History:  Procedure Laterality Date   ABDOMINAL HYSTERECTOMY  1984   secondary to bleeding and fibroid tumors   APPENDECTOMY     COLONOSCOPY W/ POLYPECTOMY  2001   COLONOSCOPY WITH PROPOFOL N/A 07/28/2016   Procedure: COLONOSCOPY WITH PROPOFOL;  Surgeon: Susan SailsMD;  [facility];  Service: Endoscopy;  Laterality: N/A;   CORONARY ARTERY BYPASS GRAFT N/A 05/07/2021   Procedure: CORONARY ARTERY BYPASS GRAFTING (CABG) X 4, USING LEFT INTERNAL MAMMARY ARTERY, LEFT LEG GREATER SAPHENOUS VEIN HARVESTED ENDOSCOPICALLY;  Surgeon: LiLajuana MatteMD;  [facility]: [facility];  Laterality: N/A;   CORONARY STENT INTERVENTION N/A 04/15/2021   Procedure: CORONARY STENT INTERVENTION;  Surgeon: ArWellington HampshireMD;  [facility];  Service: Cardiovascular;  Laterality: N/A;   DILATION AND CURETTAGE OF UTERUS   1967   ENDOVEIN HARVEST OF GREATER SAPHENOUS VEIN Left 05/07/2021   Procedure: ENDOVEIN HARVEST OF GREATER SAPHENOUS VEIN;  Surgeon: LiLajuana MatteMD;  [facility]: [facility];  Laterality: Left;   IR THORACENTESIS ASP PLEURAL SPACE W/IMG GUIDE  05/10/2021   LEFT HEART CATH AND CORONARY ANGIOGRAPHY N/A 04/15/2021   Procedure: LEFT HEART CATH AND CORONARY ANGIOGRAPHY;  Surgeon: ArWellington HampshireMD;  [facility];  Service: Cardiovascular;  Laterality: N/A;   TEE WITHOUT CARDIOVERSION N/A 05/07/2021   Procedure: TRANSESOPHAGEAL ECHOCARDIOGRAM (TEE);  Surgeon: LiLajuana MatteMD;  [facility]: [facility];  Laterality: N/A;    Allergies  Allergies  Allergen Reactions   Tape     Bruising from paper tape   Lisinopril Rash    Developed slight facial swelling and itching over forehead while taking. Unclear if definite ACE allergy, but concern enough to stop in ER today.   Z-Pak [Azithromycin] Nausea And Vomiting    Caused Emesis    History of Present Illness    [age]ear old female with above past medical history including CAD, hypertension, hyperlipidemia, erythrocytosis, sleep apnea, and colon cancer.  She was first evaluated for exertional dyspnea, increasing lower extremity edema, and an episode of chest fullness by Dr. ArFletcher Anonn November 2022.  Coronary CT angiogram showed severe LAD disease.  Echo showed normal LV function with grade 1 diastolic dysfunction, and mild to moderate mitral  regurgitation.  Diagnostic catheterization in December 2022, showed occluded LAD and severe RCA disease.  Initially, there was an effort to perform PCI in the LAD with plan for staged PCI to the RCA however, the interventional team was unable to cross the LAD with a wire and decision was made referred to cardiothoracic surgery.  She subsequently underwent CABG x4 in January 2023.  She had a brief episode of atrial fibrillation shortly after  surgery and this was managed with oral amiodarone.  She had a large left pleural effusion and in February 2023 required initiation of Lasix for 7 days.  There was initial plan for ultrasound-guided thoracentesis however, ultrasound showed only trace left pleural fluid and the procedure was not performed.  In the setting of elevated LFTs, statin therapy was discontinued in March of this year.  She has undergone extensive work-up for liver lesions with prior enhanced ultrasound in June 2023 suggesting a benign etiology with plan for follow-up next year.  [name] was doing well at June 2023 cardiology follow-up.  Most recent LFTs on September 19 showed mild elevation of ALT at 67 with a normal AST at 33.  Alkaline phosphatase remains mildly elevated 129.  From a cardiac standpoint, she has done exceptionally well over the past few months.  She is walking at least 30 minutes 5 days a week without symptoms or limitations.  She has some chest wall tenderness which has been persistent since surgery and has been slow to improve.  She denies angina, dyspnea, palpitations, PND, orthopnea, dizziness, syncope, edema, or early satiety.  Home Medications    Current Outpatient Medications  Medication Sig Dispense Refill   acetaminophen (TYLENOL) 500 MG tablet Take 500 mg by mouth every 6 (six) hours as needed for moderate pain.     amLODipine (NORVASC) 10 MG tablet Take 1 tablet (10 mg total) by mouth daily. 90 tablet 3   aspirin EC 325 MG EC tablet Take 1 tablet (325 mg total) by mouth daily.     Calcium Carbonate-Vitamin D (CALCIUM 600+D PO) Take 1 tablet by mouth daily.     metoprolol tartrate (LOPRESSOR) 25 MG tablet Take 1 tablet (25 mg total) by mouth 2 (two) times daily. 60 tablet 11   NON FORMULARY Pt uses a cpap nightly     vitamin E 400 UNIT capsule Take 400 Units by mouth daily.      No current facility-administered medications for this visit.     Review of Systems    Mild chest wall tenderness  since surgery.  She denies angina, dyspnea, palpitations, PND, orthopnea, dizziness, syncope, edema, or early satiety.  All other systems reviewed and are otherwise negative except as noted above.    Physical Exam    VS:  BP 120/68 (BP Location: Left Arm, Patient Position: Sitting, Cuff Size: Normal)   Pulse 71   Ht '5\' 3"'$  (1.6 m)   Wt 173 lb (78.5 kg)   LMP 06/28/1982   SpO2 98%   BMI 30.65 kg/m  , BMI Body mass index is 30.65 kg/m.     GEN: Well nourished, well developed, in no acute distress. HEENT: normal. Neck: Supple, no JVD, carotid bruits, or masses. Cardiac: RRR, 2/6 systolic murmur at the upper sternal borders, no rubs or gallops. No clubbing, cyanosis, edema.  Radials/DP/PT 2+ and equal bilaterally.  Respiratory:  Respirations regular and unlabored, clear to auscultation bilaterally. GI: Soft, nontender, nondistended, BS + x 4. MS: no deformity or atrophy. Skin: warm and dry,  no rash. Neuro:  Strength and sensation are intact. Psych: Normal affect.  Accessory Clinical Findings     Lab Results  Component Value Date   WBC 4.4 01/09/2022   HGB 15.9 (H) 01/09/2022   HCT 46.9 (H) 01/09/2022   MCV 87.2 01/09/2022   PLT 207 01/09/2022   Lab Results  Component Value Date   CREATININE 0.81 01/21/2022   BUN 17 01/21/2022   NA 137 01/21/2022   K 3.7 01/21/2022   CL 102 01/21/2022   CO2 29 01/21/2022   Lab Results  Component Value Date   ALT 67 (H) 01/21/2022   AST 33 01/21/2022   ALKPHOS 129 (H) 01/21/2022   BILITOT 0.6 01/21/2022   Lab Results  Component Value Date   CHOL 202 (H) 01/21/2022   HDL 71.10 01/21/2022   LDLCALC 119 (H) 01/21/2022   LDLDIRECT 53.0 07/18/2021   TRIG 57.0 01/21/2022   CHOLHDL 3 01/21/2022    Lab Results  Component Value Date   HGBA1C 5.9 01/21/2022    Assessment & Plan    1.  Coronary artery disease: Status post CABG x4 in January 2023.  She has been doing exceptionally well, walking 30 minutes, 5 days a week, without  symptoms or limitations.  She remains on aspirin, beta-blocker, and calcium channel blocker therapy.  She is not on a statin secondary to history of elevated LFTs on statin.  Liver lesions have been worked up extensively via the cancer center.  LFTs have been improving.  She is interested in trying Repatha and we will send in a prescription today.  2.  Hyperlipidemia: LDL of 119 in September in the absence of statin therapy.  Previously had elevated LFTs on statin and as above, has had significant evaluation of her liver in the setting of liver lesions which are now felt to be benign with plan for follow-up imaging next year.  We discussed the addition of Repatha therapy, which should have no impact on LFTs and she is interested in pursuing this and seeing if this will be financially feasible for her.  Presuming she is able to obtain it, we will plan for follow-up lipids and LFTs in 6 to 8 weeks.  3.  Essential hypertension: Blood pressure is normal today at 120/68.  Continue amlodipine and beta-blocker therapy.  4.  Liver lesions/elevated LFTs: Status post enhanced ultrasound in June suggesting benign etiology.  Mild ALT elevation (67), and alkaline phosphatase elevation (129) in September.  Plan for follow-up imaging next year.  5.  Postoperative atrial fibrillation: Brief episode of atrial fibrillation postoperatively in January of this year.  Regular on exam today.  Remains on beta-blocker and aspirin.  6.  Disposition: Follow-up lipids and LFTs in approximately 6 to 8 weeks if she is able to obtain Repatha.  Otherwise, follow-up in clinic in 6 months or sooner if necessary.  Murray Hodgkins, NP 02/28/2022, 1:40 PM

## 2022-02-28 NOTE — Patient Instructions (Signed)
Medication Instructions:   START Repatha - Inject one pen every 14 days    *If you need a refill on your cardiac medications before your next appointment, please call your pharmacy*   Lab Work:  Your physician recommends that you return for lab work in: 2 weeks at the medical mall. You will need to be fasting. No appt is needed. Hours are M-F 7AM- 6 PM.  If you have labs (blood work) drawn today and your tests are completely normal, you will receive your results only by: [place] (if you have MyChart) OR A paper copy in the mail If you have any lab test that is abnormal or we need to change your treatment, we will call you to review the results.   Testing/Procedures:  None Ordered   Follow-Up: At [facility], you and your health needs are our priority.  As part of our continuing mission to provide you with exceptional heart care, we have created designated Provider Care Teams.  These Care Teams include your primary Cardiologist (physician) and Advanced Practice Providers (APPs -  Physician Assistants and Nurse Practitioners) who all work together to provide you with the care you need, when you need it.  We recommend signing up for the patient portal called "MyChart".  Sign up information is provided on this After Visit Summary.  MyChart is used to connect with patients for Virtual Visits (Telemedicine).  Patients are able to view lab/test results, encounter notes, upcoming appointments, etc.  Non-urgent messages can be sent to your provider as well.   To learn more about what you can do with MyChart, go to [email or web address].    Your next appointment:   6 month(s)  The format for your next appointment:   In Person  Provider:   You may see [name], MD or one of the following Advanced Practice Providers on your designated Care Team:   Murray Hodgkins, NP Christell Faith, PA-C Cadence Kathlen Mody, PA-C Gerrie Nordmann, NP

## 2022-03-03 ENCOUNTER — Telehealth: Payer: Self-pay

## 2022-03-03 NOTE — Telephone Encounter (Signed)
Prior Authorization initiated by [email or web address]  KEY: [id number]  Response: This request has received an approval.

## 2022-04-03 ENCOUNTER — Other Ambulatory Visit: Payer: Self-pay | Admitting: Cardiovascular Disease

## 2022-04-08 ENCOUNTER — Other Ambulatory Visit
Admission: RE | Admit: 2022-04-08 | Discharge: 2022-04-08 | Disposition: A | Discharge status: Home / Self Care | Payer: BC Managed Care – PPO | Attending: Nurse Practitioner | Admitting: Nurse Practitioner

## 2022-04-08 DIAGNOSIS — E785 Hyperlipidemia, unspecified: Secondary | ICD-10-CM | POA: Insufficient documentation

## 2022-04-08 LAB — LIPID PANEL
Cholesterol: 148 mg/dL (ref 0–200)
HDL: 68 mg/dL (ref >40)
LDL Cholesterol: 66 mg/dL (ref 0–99)
Total CHOL/HDL Ratio: 2.2 RATIO
Triglycerides: 71 mg/dL (ref <150)
VLDL: 14 mg/dL (ref 0–40)

## 2022-04-08 LAB — HEPATIC FUNCTION PANEL
ALT: 53 U/L — ABNORMAL HIGH (ref 0–44)
AST: 27 U/L (ref 15–41)
Albumin: 3.9 g/dL (ref 3.5–5.0)
Alkaline Phosphatase: 102 U/L (ref 38–126)
Bilirubin, Direct: 0.3 mg/dL — ABNORMAL HIGH (ref 0.0–0.2)
Indirect Bilirubin: 0.9 mg/dL (ref 0.3–0.9)
Total Bilirubin: 1.2 mg/dL (ref 0.3–1.2)
Total Protein: 6.9 g/dL (ref 6.5–8.1)

## 2022-04-25 ENCOUNTER — Encounter: Payer: Self-pay | Admitting: Internal Medicine

## 2022-04-25 ENCOUNTER — Ambulatory Visit (INDEPENDENT_AMBULATORY_CARE_PROVIDER_SITE_OTHER): Payer: BC Managed Care – PPO | Admitting: Internal Medicine

## 2022-04-25 VITALS — BP 138/80 | HR 71 | Temp 97.9°F | Resp 15 | Ht 63.0 in | Wt 177.0 lb

## 2022-04-25 DIAGNOSIS — R945 Abnormal results of liver function studies: Secondary | ICD-10-CM

## 2022-04-25 DIAGNOSIS — G4730 Sleep apnea, unspecified: Secondary | ICD-10-CM

## 2022-04-25 DIAGNOSIS — I10 Essential (primary) hypertension: Secondary | ICD-10-CM | POA: Diagnosis not present

## 2022-04-25 DIAGNOSIS — D72819 Decreased white blood cell count, unspecified: Secondary | ICD-10-CM

## 2022-04-25 DIAGNOSIS — E7800 Pure hypercholesterolemia, unspecified: Secondary | ICD-10-CM | POA: Diagnosis not present

## 2022-04-25 DIAGNOSIS — Z23 Encounter for immunization: Secondary | ICD-10-CM | POA: Diagnosis not present

## 2022-04-25 DIAGNOSIS — I25119 Atherosclerotic heart disease of native coronary artery with unspecified angina pectoris: Secondary | ICD-10-CM

## 2022-04-25 DIAGNOSIS — R739 Hyperglycemia, unspecified: Secondary | ICD-10-CM

## 2022-04-25 DIAGNOSIS — Z85038 Personal history of other malignant neoplasm of large intestine: Secondary | ICD-10-CM

## 2022-04-25 DIAGNOSIS — D582 Other hemoglobinopathies: Secondary | ICD-10-CM

## 2022-04-25 DIAGNOSIS — Z951 Presence of aortocoronary bypass graft: Secondary | ICD-10-CM

## 2022-04-25 LAB — POCT GLYCOSYLATED HEMOGLOBIN (HGB A1C): Hemoglobin A1C: 5.6 % (ref 4.0–5.6)

## 2022-04-25 NOTE — Progress Notes (Unsigned)
Subjective:    [name]: [name], female    DOB: 03-24-1951, 71 y.o.   MRN: [id number]  Patient here for  Chief Complaint  Patient presents with   Medical Management of Chronic Issues   Hypertension   Hyperlipidemia    HPI Here to follow up regarding her blood pressure and cholesterol.  Recently evaluated by GI - f/u elevated liver enzymes and due colonoscopy.  Also saw cardiology 02/28/22 - recommended starting repatha.  Doing well on repatha.  Tolerating.  No chest pain.  No sob.  Exercising.  No nausea or vomiting.  Bowels moving.     Past Medical History:  Diagnosis Date   Abnormal liver function    Colon cancer ([place])    adenocarcinoma of the cecum s/p partial colon resection   Coronary artery disease    a. 03/2021 Cor CTA: sev LAD dzs; b. 04/2021 Cath: LM nl, LAD 143m(unable to wire), D1 60, LCX nl, OM3 60, LPAV 100, RCA 90p; c. 05/2021 CABG x 4: LIMA->LAD, VG->RPDA, VG->OM1, VG->OM3.   History of colon polyps    Hyperlipidemia    Hypertension    Post-operative Atrial Fibrillation ([place]    a. 05/2021 Brief PAF after CABG-->amio.   Sleep apnea 037/16/9678  Systolic murmur    a. 193/8101Echo: EF 60-65%, no rwma, GrI DD, RVSP 28.861mg. Mild-mod MR.   Past Surgical History:  Procedure Laterality Date   ABDOMINAL HYSTERECTOMY  1984   secondary to bleeding and fibroid tumors   APPENDECTOMY     COLONOSCOPY W/ POLYPECTOMY  2001   COLONOSCOPY WITH PROPOFOL N/A 07/28/2016   Procedure: COLONOSCOPY WITH PROPOFOL;  Surgeon: MaLollie SailsMD;  [facility];  Service: Endoscopy;  Laterality: N/A;   CORONARY ARTERY BYPASS GRAFT N/A 05/07/2021   Procedure: CORONARY ARTERY BYPASS GRAFTING (CABG) X 4, USING LEFT INTERNAL MAMMARY ARTERY, LEFT LEG GREATER SAPHENOUS VEIN HARVESTED ENDOSCOPICALLY;  Surgeon: LiLajuana MatteMD;  [facility] Service: Open Heart Surgery;  Laterality: N/A;   CORONARY STENT INTERVENTION N/A 04/15/2021   Procedure: CORONARY  STENT INTERVENTION;  Surgeon: ArWellington HampshireMD;  [facility];  Service: Cardiovascular;  Laterality: N/A;   DILATION AND CURETTAGE OF UTERUS  1967   ENDOVEIN HARVEST OF GREATER SAPHENOUS VEIN Left 05/07/2021   Procedure: ENDOVEIN HARVEST OF GREATER SAPHENOUS VEIN;  Surgeon: LiLajuana MatteMD;  [place] [facility]: Open [facility] Surgery;  Laterality: Left;   IR THORACENTESIS ASP PLEURAL SPACE W/IMG GUIDE  05/10/2021   LEFT HEART CATH AND CORONARY ANGIOGRAPHY N/A 04/15/2021   Procedure: LEFT HEART CATH AND CORONARY ANGIOGRAPHY;  Surgeon: ArWellington HampshireMD;  [facility];  Service: Cardiovascular;  Laterality: N/A;   TEE WITHOUT CARDIOVERSION N/A 05/07/2021   Procedure: TRANSESOPHAGEAL ECHOCARDIOGRAM (TEE);  Surgeon: LiLajuana MatteMD;  [facility]: [facility];  Laterality: N/A;   Family History  Problem Relation Age of Onset   Stroke Mother    Hypertension Mother    Cancer Father        Prostate    Hypertension Sister        x4   Heart attack Brother    Heart disease Brother        myocardial infarction   Hypertension Brother    Breast cancer Neg Hx    Social History   Socioeconomic History   Marital status: Married    Spouse  name: [name]   Number of children: 1   Years of education: HS   Highest education level: Not on file  Occupational History    Employer: OTHER    Comment: [employer]  Tobacco Use   Smoking status: Former    Years: 10.00    Types: Cigarettes    Quit date: 1978    Years since quitting: 46.0   Smokeless tobacco: Never   Tobacco comments:    1 pack per week--only smoked on the weekends.   Vaping Use   Vaping Use: Never used  Substance and Sexual Activity   Alcohol use: No    Alcohol/week: 0.0 standard drinks of alcohol   Drug use: No   Sexual activity: Not on file  Other Topics Concern   Not on file  Social History Narrative   Not on file   Social Determinants of  Health   Financial Resource Strain: Not on file  Food Insecurity: Not on file  Transportation Needs: Not on file  Physical Activity: Not on file  Stress: Not on file  Social Connections: Not on file     Review of Systems  Constitutional:  Negative for appetite change and unexpected weight change.  HENT:  Negative for congestion and sinus pressure.   Respiratory:  Negative for cough, chest tightness and shortness of breath.   Cardiovascular:  Negative for chest pain, palpitations and leg swelling.  Gastrointestinal:  Negative for abdominal pain, diarrhea, nausea and vomiting.  Genitourinary:  Negative for difficulty urinating and dysuria.  Musculoskeletal:  Negative for joint swelling and myalgias.  Skin:  Negative for color change and rash.  Neurological:  Negative for dizziness and headaches.  Psychiatric/Behavioral:  Negative for agitation and dysphoric mood.        Objective:     BP 138/80 (BP Location: Left Arm, Patient Position: Sitting, Cuff Size: Small)   Pulse 71   Temp 97.9 F (36.6 C) (Temporal)   Resp 15   Ht 5' 3" (1.6 m)   Wt 177 lb (80.3 kg)   LMP 06/28/1982   SpO2 98%   BMI 31.35 kg/m  Wt Readings from Last 3 Encounters:  04/25/22 177 lb (80.3 kg)  02/28/22 173 lb (78.5 kg)  01/14/22 172 lb (78 kg)    Physical Exam Vitals reviewed.  Constitutional:      General: She is not in acute distress.    Appearance: Normal appearance.  HENT:     Head: Normocephalic and atraumatic.     Right Ear: External ear normal.     Left Ear: External ear normal.  Eyes:     General: No scleral icterus.       Right eye: No discharge.        Left eye: No discharge.     Conjunctiva/sclera: Conjunctivae normal.  Neck:     Thyroid: No thyromegaly.  Cardiovascular:     Rate and Rhythm: Normal rate and regular rhythm.  Pulmonary:     Effort: No respiratory distress.     Breath sounds: Normal breath sounds. No wheezing.  Abdominal:     General: Bowel sounds are  normal.     Palpations: Abdomen is soft.     Tenderness: There is no abdominal tenderness.  Musculoskeletal:        General: No swelling or tenderness.     Cervical back: Neck supple. No tenderness.  Lymphadenopathy:     Cervical: No cervical adenopathy.  Skin:    Findings: No erythema or rash.  Neurological:     Mental Status: She is alert.  Psychiatric:        Mood and Affect: Mood normal.        Behavior: Behavior normal.      Outpatient Encounter Medications as of 04/25/2022  Medication Sig   acetaminophen (TYLENOL) 500 MG tablet Take 500 mg by mouth every 6 (six) hours as needed for moderate pain.   amLODipine (NORVASC) 10 MG tablet Take 1 tablet (10 mg total) by mouth daily.   aspirin EC 325 MG EC tablet Take 1 tablet (325 mg total) by mouth daily.   Calcium Carbonate-Vitamin D (CALCIUM 600+D PO) Take 1 tablet by mouth daily.   Evolocumab (REPATHA SURECLICK) 419 MG/ML SOAJ Inject 140 mg into the skin every 14 (fourteen) days.   metoprolol tartrate (LOPRESSOR) 25 MG tablet TAKE 1 TABLET BY MOUTH TWICE A DAY   NON FORMULARY Pt uses a cpap nightly   vitamin E 400 UNIT capsule Take 400 Units by mouth daily.    No facility-administered encounter medications on file as of 04/25/2022.     Lab Results  Component Value Date   WBC 4.4 01/09/2022   HGB 15.9 (H) 01/09/2022   HCT 46.9 (H) 01/09/2022   PLT 207 01/09/2022   GLUCOSE 95 01/21/2022   CHOL 148 04/08/2022   TRIG 71 04/08/2022   HDL 68 04/08/2022   LDLDIRECT 53.0 07/18/2021   LDLCALC 66 04/08/2022   ALT 53 (H) 04/08/2022   AST 27 04/08/2022   NA 137 01/21/2022   K 3.7 01/21/2022   CL 102 01/21/2022   CREATININE 0.81 01/21/2022   BUN 17 01/21/2022   CO2 29 01/21/2022   TSH 1.18 09/24/2021   INR 1.0 11/15/2021   HGBA1C 5.6 04/25/2022    No results found.     Assessment & Plan:  Essential hypertension, benign Assessment & Plan: Had swelling on lisinopril.  On amlodipine. Blood pressure as outlined..   Follow pressures.  Follow metabolic panel.   Orders: -     Basic metabolic panel; Future  Abnormal liver function Assessment & Plan: Off statin due to elevated liver function tests.  Being evaluated by oncology.  Dr Tasia Catchings - ordered f/u abdominal ultrasound - liver mass unchanged/smaller - favored benign lesion.  F/u Dr Tasia Catchings - f/u enhanced ultrasound recommended one year.   Orders: -     Hepatic function panel; Future  Hyperglycemia Assessment & Plan: Low carb diet and exercise.  Follow met b and a1c.   Orders: -     Hemoglobin A1c; Future -     POCT glycosylated hemoglobin (Hb A1C)  Pure hypercholesterolemia Assessment & Plan: Continues on crestor.  Low cholesterol diet and exercise.  Follow lipid panel and liver function tests.   Lab Results  Component Value Date   CHOL 148 04/08/2022   HDL 68 04/08/2022   LDLCALC 66 04/08/2022   LDLDIRECT 53.0 07/18/2021   TRIG 71 04/08/2022   CHOLHDL 2.2 04/08/2022    Orders: -     Lipid panel; Future  Need for influenza vaccination -     Flu Vaccine QUAD High Dose(Fluad)  Coronary artery disease involving native coronary artery of native heart with angina pectoris [facility]) Assessment & Plan: Doing well on amlodipine, metoprolol.  Off statin due to liver enzyme elevation. No pain.  On repatha. Follow.    Elevated hemoglobin (HCC) Assessment & Plan: Followed by hematology as outlined.  Continue cpap.    History of colon cancer Assessment &  Plan: Colonoscopy 10/2019 - recommended f/u in 3 years.    Leukopenia, unspecified type Assessment & Plan: Followed by Dr Tasia Catchings.  Follow cbc.    S/P CABG x 4 Assessment & Plan: Has done well s/p surgery.  In SR today on exam. No chest pain or sob reported.  On repatha and tolerating.  Exercising.  Continue f/u with cardiology.    Sleep apnea, unspecified type Assessment & Plan: Continue cpap.      [name], MD

## 2022-04-28 ENCOUNTER — Encounter: Payer: Self-pay | Admitting: Internal Medicine

## 2022-04-28 NOTE — Assessment & Plan Note (Signed)
Off statin due to elevated liver function tests.  Being evaluated by oncology.  Dr Tasia Catchings - ordered f/u abdominal ultrasound - liver mass unchanged/smaller - favored benign lesion.  F/u Dr Tasia Catchings - f/u enhanced ultrasound recommended one year.

## 2022-04-28 NOTE — Assessment & Plan Note (Signed)
Colonoscopy 10/2019 - recommended f/u in 3 years.

## 2022-04-28 NOTE — Assessment & Plan Note (Signed)
Doing well on amlodipine, metoprolol.  Off statin due to liver enzyme elevation. No pain.  On repatha. Follow.

## 2022-04-28 NOTE — Assessment & Plan Note (Signed)
Low carb diet and exercise.  Follow met b and a1c.  

## 2022-04-28 NOTE — Assessment & Plan Note (Signed)
Followed by hematology as outlined.  Continue cpap.

## 2022-04-28 NOTE — Assessment & Plan Note (Signed)
Followed by Dr Tasia Catchings.  Follow cbc.

## 2022-04-28 NOTE — Assessment & Plan Note (Signed)
Has done well s/p surgery.  In SR today on exam. No chest pain or sob reported.  On repatha and tolerating.  Exercising.  Continue f/u with cardiology.

## 2022-04-28 NOTE — Assessment & Plan Note (Signed)
Continue cpap.  

## 2022-04-28 NOTE — Assessment & Plan Note (Signed)
Continues on crestor.  Low cholesterol diet and exercise.  Follow lipid panel and liver function tests.   Lab Results  Component Value Date   CHOL 148 04/08/2022   HDL 68 04/08/2022   LDLCALC 66 04/08/2022   LDLDIRECT 53.0 07/18/2021   TRIG 71 04/08/2022   CHOLHDL 2.2 04/08/2022

## 2022-04-28 NOTE — Assessment & Plan Note (Signed)
Had swelling on lisinopril.  On amlodipine. Blood pressure as outlined..  Follow pressures.  Follow metabolic panel.

## 2022-04-30 ENCOUNTER — Ambulatory Visit: Payer: BC Managed Care – PPO | Admitting: Internal Medicine

## 2022-05-19 ENCOUNTER — Other Ambulatory Visit: Payer: Self-pay | Admitting: Nurse Practitioner

## 2022-05-19 DIAGNOSIS — E785 Hyperlipidemia, unspecified: Secondary | ICD-10-CM

## 2022-06-08 ENCOUNTER — Other Ambulatory Visit: Payer: Self-pay | Admitting: Nurse Practitioner

## 2022-06-08 DIAGNOSIS — E785 Hyperlipidemia, unspecified: Secondary | ICD-10-CM

## 2022-06-08 DIAGNOSIS — I25119 Atherosclerotic heart disease of native coronary artery with unspecified angina pectoris: Secondary | ICD-10-CM

## 2022-06-09 NOTE — Telephone Encounter (Signed)
Routed to PharmD Pool 

## 2022-06-27 ENCOUNTER — Encounter: Payer: Self-pay | Admitting: *Deleted

## 2022-06-30 ENCOUNTER — Ambulatory Visit: Payer: BC Managed Care – PPO | Admitting: Certified Registered Nurse Anesthetist

## 2022-06-30 ENCOUNTER — Ambulatory Visit
Admission: RE | Admit: 2022-06-30 | Discharge: 2022-06-30 | Disposition: A | Discharge status: Home / Self Care | Payer: BC Managed Care – PPO | Attending: Gastroenterology | Admitting: Gastroenterology

## 2022-06-30 ENCOUNTER — Encounter
Admission: RE | Disposition: A | Discharge status: Home / Self Care | Payer: Self-pay | Source: Home / Self Care | Attending: Gastroenterology

## 2022-06-30 DIAGNOSIS — Z86010 Personal history of colonic polyps: Secondary | ICD-10-CM | POA: Insufficient documentation

## 2022-06-30 DIAGNOSIS — G4730 Sleep apnea, unspecified: Secondary | ICD-10-CM | POA: Insufficient documentation

## 2022-06-30 DIAGNOSIS — Z980 Intestinal bypass and anastomosis status: Secondary | ICD-10-CM | POA: Diagnosis not present

## 2022-06-30 DIAGNOSIS — I10 Essential (primary) hypertension: Secondary | ICD-10-CM | POA: Insufficient documentation

## 2022-06-30 DIAGNOSIS — Z951 Presence of aortocoronary bypass graft: Secondary | ICD-10-CM | POA: Diagnosis not present

## 2022-06-30 DIAGNOSIS — I2510 Atherosclerotic heart disease of native coronary artery without angina pectoris: Secondary | ICD-10-CM | POA: Insufficient documentation

## 2022-06-30 DIAGNOSIS — Z1211 Encounter for screening for malignant neoplasm of colon: Secondary | ICD-10-CM | POA: Diagnosis present

## 2022-06-30 DIAGNOSIS — Z7982 Long term (current) use of aspirin: Secondary | ICD-10-CM | POA: Insufficient documentation

## 2022-06-30 HISTORY — PX: COLONOSCOPY WITH PROPOFOL: SHX5780

## 2022-06-30 SURGERY — COLONOSCOPY WITH PROPOFOL
Anesthesia: General

## 2022-06-30 MED ORDER — LIDOCAINE HCL (CARDIAC) PF 100 MG/5ML IV SOSY
PREFILLED_SYRINGE | INTRAVENOUS | Status: DC | PRN
  Administered 2022-06-30: 50 mg via INTRAVENOUS

## 2022-06-30 MED ORDER — SODIUM CHLORIDE 0.9 % IV SOLN
INTRAVENOUS | Status: DC
  Administered 2022-06-30: 20 mL/h via INTRAVENOUS

## 2022-06-30 MED ORDER — PROPOFOL 10 MG/ML IV BOLUS
INTRAVENOUS | Status: DC | PRN
  Administered 2022-06-30: 80 mg via INTRAVENOUS

## 2022-06-30 MED ORDER — STERILE WATER FOR IRRIGATION IR SOLN
Status: DC | PRN
  Administered 2022-06-30: 60 mL

## 2022-06-30 MED ORDER — PROPOFOL 500 MG/50ML IV EMUL
INTRAVENOUS | Status: DC | PRN
  Administered 2022-06-30: 150 ug/kg/min via INTRAVENOUS

## 2022-06-30 NOTE — Anesthesia Preprocedure Evaluation (Addendum)
Anesthesia Evaluation  Patient identified by MRN, date of birth, ID band Patient awake    Reviewed: Allergy & Precautions, NPO status , Patient's Chart, lab work & pertinent test results  History of Anesthesia Complications Negative for: history of anesthetic complications  Airway Mallampati: III   Neck ROM: Full    Dental  (+) Missing   Pulmonary sleep apnea and Continuous Positive Airway Pressure Ventilation , former smoker (quit 1978)   Pulmonary exam normal breath sounds clear to auscultation       Cardiovascular hypertension, + CAD (s/p 4V CABG 05/07/21)  Normal cardiovascular exam Rhythm:Regular Rate:Normal  ECG 10/22/21: SR with 1st degree AVB; biatrial enlargement; anterolateral infarct, age undetermined   Neuro/Psych negative neurological ROS     GI/Hepatic negative GI ROS,,,  Endo/Other  Obesity   Renal/GU negative Renal ROS     Musculoskeletal   Abdominal   Peds  Hematology negative hematology ROS (+)   Anesthesia Other Findings Cardiology note 02/28/22:  1.  Coronary artery disease: Status post CABG x4 in January 2023.  She has been doing exceptionally well, walking 30 minutes, 5 days a week, without symptoms or limitations.  She remains on aspirin, beta-blocker, and calcium channel blocker therapy.  She is not on a statin secondary to history of elevated LFTs on statin.  Liver lesions have been worked up extensively via the cancer center.  LFTs have been improving.  She is interested in trying Repatha and we will send in a prescription today.   2.  Hyperlipidemia: LDL of 119 in September in the absence of statin therapy.  Previously had elevated LFTs on statin and as above, has had significant evaluation of her liver in the setting of liver lesions which are now felt to be benign with plan for follow-up imaging next year.  We discussed the addition of Repatha therapy, which should have no impact on LFTs and she  is interested in pursuing this and seeing if this will be financially feasible for her.  Presuming she is able to obtain it, we will plan for follow-up lipids and LFTs in 6 to 8 weeks.   3.  Essential hypertension: Blood pressure is normal today at 120/68.  Continue amlodipine and beta-blocker therapy.   4.  Liver lesions/elevated LFTs: Status post enhanced ultrasound in June suggesting benign etiology.  Mild ALT elevation (67), and alkaline phosphatase elevation (129) in September.  Plan for follow-up imaging next year.   5.  Postoperative atrial fibrillation: Brief episode of atrial fibrillation postoperatively in January of this year.  Regular on exam today.  Remains on beta-blocker and aspirin.   6.  Disposition: Follow-up lipids and LFTs in approximately 6 to 8 weeks if she is able to obtain Repatha.  Otherwise, follow-up in clinic in 6 months or sooner if necessary.   Reproductive/Obstetrics                             Anesthesia Physical Anesthesia Plan  ASA: 2  Anesthesia Plan: General   Post-op Pain Management:    Induction: Intravenous  PONV Risk Score and Plan: 3 and Propofol infusion, TIVA and Treatment may vary due to age or medical condition  Airway Management Planned: Natural Airway  Additional Equipment:   Intra-op Plan:   Post-operative Plan:   Informed Consent: I have reviewed the patients History and Physical, chart, labs and discussed the procedure including the risks, benefits and alternatives for the proposed anesthesia with  the patient or authorized representative who has indicated his/her understanding and acceptance.       Plan Discussed with: CRNA  Anesthesia Plan Comments: (LMA/GETA backup discussed.  Patient consented for risks of anesthesia including but not limited to:  - adverse reactions to medications - damage to eyes, teeth, lips or other oral mucosa - nerve damage due to positioning  - sore throat or hoarseness -  damage to heart, brain, nerves, lungs, other parts of body or loss of life  Informed patient about role of CRNA in peri- and intra-operative care.  Patient voiced understanding.)        Anesthesia Quick Evaluation

## 2022-06-30 NOTE — Anesthesia Postprocedure Evaluation (Signed)
Anesthesia Post Note  Patient: SHENEA MUNSON  Procedure(s) Performed: COLONOSCOPY WITH PROPOFOL  Patient location during evaluation: PACU Anesthesia Type: General Level of consciousness: awake and alert, oriented and patient cooperative Pain management: pain level controlled Vital Signs Assessment: post-procedure vital signs reviewed and stable Respiratory status: spontaneous breathing, nonlabored ventilation and respiratory function stable Cardiovascular status: blood pressure returned to baseline and stable Postop Assessment: adequate PO intake Anesthetic complications: no   No notable events documented.   Last Vitals:  Vitals:   06/30/22 0959 06/30/22 1001  BP: (!) 79/68 103/61  Pulse: 65 83  Resp: 14 (!) 22  Temp: (!) 36.3 C   SpO2: 99% 100%    Last Pain:  Vitals:   06/30/22 1021  TempSrc:   PainSc: 0-No pain                 Darrin Nipper

## 2022-06-30 NOTE — H&P (Signed)
Outpatient short stay form Pre-procedure 06/30/2022  Susan Rubenstein, MD  Primary Physician: [name], MD  Reason for visit:  Surveillance  History of present illness:    72 y/o lady with history of hypertension and a large polyp (unclear if cancer or villous polyp in 2001 that required ileocectomy. Last colonoscopy in 2021 was unremarkable. No family history of GI malignancies. Takes full dose aspirin.    Current Facility-Administered Medications:    0.9 %  sodium chloride infusion, , Intravenous, Continuous, Darrie Macmillan, Hilton Cork, MD, Last Rate: 20 mL/hr at 06/30/22 [id number], Continued from Pre-op at 06/30/22 [id number]  Medications Prior to Admission  Medication Sig Dispense Refill Last Dose   acetaminophen (TYLENOL) 500 MG tablet Take 500 mg by mouth every 6 (six) hours as needed for moderate pain.   06/29/2022   amLODipine (NORVASC) 10 MG tablet Take 1 tablet (10 mg total) by mouth daily. 90 tablet 3 06/29/2022   aspirin EC 325 MG EC tablet Take 1 tablet (325 mg total) by mouth daily.   06/29/2022   Calcium Carbonate-Vitamin D (CALCIUM 600+D PO) Take 1 tablet by mouth daily.   06/29/2022   Evolocumab (REPATHA SURECLICK) [id number] MG/ML SOAJ INJECT 140 MG INTO THE SKIN EVERY 14 (FOURTEEN) DAYS. 6 mL 3 06/29/2022   metoprolol tartrate (LOPRESSOR) 25 MG tablet TAKE 1 TABLET BY MOUTH TWICE A DAY 180 tablet 3 06/29/2022   NON FORMULARY Pt uses a cpap nightly   06/29/2022   vitamin E 400 UNIT capsule Take 400 Units by mouth daily.    06/29/2022     Allergies  Allergen Reactions   Tape     Bruising from paper tape   Lisinopril Rash    Developed slight facial swelling and itching over forehead while taking. Unclear if definite ACE allergy, but concern enough to stop in ER today.   Z-Pak [Azithromycin] Nausea And Vomiting    Caused Emesis     Past Medical History:  Diagnosis Date   Abnormal liver function    Colon cancer (HCC)    adenocarcinoma of the cecum s/p partial colon resection    Coronary artery disease    a. 03/2021 Cor CTA: sev LAD dzs; b. 04/2021 Cath: LM nl, LAD 157m(unable to wire), D1 60, LCX nl, OM3 60, LPAV 100, RCA 90p; c. 05/2021 CABG x 4: LIMA->LAD, VG->RPDA, VG->OM1, VG->OM3.   History of colon polyps    Hyperlipidemia    Hypertension    [place]    a. 05/2021 Brief PAF after CABG-->amio.   Sleep apnea [id number]  Systolic murmur    a. [id number]: EF 60-65%, no rwma, GrI DD, RVSP 28.824mg. Mild-mod MR.    Review of systems:  Otherwise negative.    Physical Exam  Gen: Alert, oriented. Appears stated age.  HEENT: PERRLA. Lungs: No respiratory distress CV: RRR Abd: soft, benign, no masses Ext: No edema    Planned procedures: Proceed with colonoscopy. The patient understands the nature of the planned procedure, indications, risks, alternatives and potential complications including but not limited to bleeding, infection, perforation, damage to internal organs and possible oversedation/side effects from anesthesia. The patient agrees and gives consent to proceed.  Please refer to procedure notes for findings, recommendations and patient disposition/instructions.     CaLesly RubensteinMD [facility]

## 2022-06-30 NOTE — Transfer of Care (Signed)
Immediate Anesthesia Transfer of Care Note  Patient: Susan Weeks  Procedure(s) Performed: COLONOSCOPY WITH PROPOFOL  Patient Location: Endoscopy Unit  Anesthesia Type:General  Level of Consciousness: awake and drowsy  Airway & Oxygen Therapy: Patient Spontanous Breathing  Post-op Assessment: Report given to RN and Post -op Vital signs reviewed and stable  Post vital signs: Reviewed and stable  Last Vitals:  Vitals Value Taken Time  BP 103/61 06/30/22 1001  Temp 36.3 C 06/30/22 0959  Pulse 84 06/30/22 1001  Resp 23 06/30/22 1001  SpO2 100 % 06/30/22 1001  Vitals shown include unvalidated device data.  Last Pain:  Vitals:   06/30/22 0959  TempSrc: Temporal  PainSc: 0-No pain         Complications: No notable events documented.

## 2022-06-30 NOTE — Interval H&P Note (Signed)
History and Physical Interval Note:  06/30/2022 9:34 AM  Susan Weeks  has presented today for surgery, with the diagnosis of hx of colon cancer.  The various methods of treatment have been discussed with the patient and family. After consideration of risks, benefits and other options for treatment, the patient has consented to  Procedure(s): COLONOSCOPY WITH PROPOFOL (N/A) as a surgical intervention.  The patient's history has been reviewed, patient examined, no change in status, stable for surgery.  I have reviewed the patient's chart and labs.  Questions were answered to the patient's satisfaction.     Lesly Rubenstein  Ok to proceed with colonoscopy

## 2022-06-30 NOTE — Anesthesia Procedure Notes (Signed)
Date/Time: 06/30/2022 9:38 AM  Performed by: Johnna Acosta, CRNAPre-anesthesia Checklist: Patient identified, Emergency Drugs available, Suction available, Patient being monitored and Timeout performed Patient Re-evaluated:Patient Re-evaluated prior to induction Oxygen Delivery Method: Nasal cannula Preoxygenation: Pre-oxygenation with 100% oxygen Induction Type: IV induction

## 2022-06-30 NOTE — Op Note (Signed)
[facility] Patient Name: Susan Weeks Procedure Date: 06/30/2022 9:38 AM MRN: [id number] Account #: [id number] Date of Birth: Dec 24, 1950 Admit Type: Outpatient Age: 72 Room: [facility] ENDO ROOM 3 Gender: Female Note Status: Finalized Instrument Name: Jasper Riling [id number] Procedure:             Colonoscopy Indications:           High risk colon cancer surveillance: Personal history                         of adenoma with villous component, Last colonoscopy 3                         years ago Providers:             Andrey Farmer MD, MD Referring MD:          [name], MD (Referring MD) Medicines:             Monitored Anesthesia Care Complications:         No immediate complications. Procedure:             Pre-Anesthesia Assessment:                        - Prior to the procedure, a History and Physical was                         performed, and patient medications and allergies were                         reviewed. The patient is competent. The risks and                         benefits of the procedure and the sedation options and                         risks were discussed with the patient. All questions                         were answered and informed consent was obtained.                         Patient identification and proposed procedure were                         verified by the physician, the nurse, the                         anesthesiologist, the anesthetist and the technician                         in the endoscopy suite. Mental Status Examination:                         alert and oriented. Airway Examination: normal                         oropharyngeal airway and neck mobility. Respiratory  Examination: clear to auscultation. CV Examination:                         normal. Prophylactic Antibiotics: The patient does not                         require prophylactic antibiotics. Prior                          Anticoagulants: The patient has taken no anticoagulant                         or antiplatelet agents except for aspirin. ASA Grade                         Assessment: II - A patient with mild systemic disease.                         After reviewing the risks and benefits, the patient                         was deemed in satisfactory condition to undergo the                         procedure. The anesthesia plan was to use monitored                         anesthesia care (MAC). Immediately prior to                         administration of medications, the patient was                         re-assessed for adequacy to receive sedatives. The                         heart rate, respiratory rate, oxygen saturations,                         blood pressure, adequacy of pulmonary ventilation, and                         response to care were monitored throughout the                         procedure. The physical status of the patient was                         re-assessed after the procedure.                        After obtaining informed consent, the colonoscope was                         passed under direct vision. Throughout the procedure,                         the patient's blood pressure, pulse, and oxygen  saturations were monitored continuously. The                         Colonoscope was introduced through the anus and                         advanced to the the ileocolonic anastomosis. The                         colonoscopy was performed without difficulty. The                         patient tolerated the procedure well. The quality of                         the bowel preparation was good. Ileocolonic                         anastomosis and rectum were photographed. Findings:      The perianal and digital rectal examinations were normal.      There was evidence of a prior functional end-to-end ileo-colonic       anastomosis in the ascending  colon. This was patent and was       characterized by healthy appearing mucosa.      The exam was otherwise without abnormality on direct and retroflexion       views. Impression:            - Patent functional end-to-end ileo-colonic                         anastomosis, characterized by healthy appearing mucosa.                        - The examination was otherwise normal on direct and                         retroflexion views.                        - No specimens collected. Recommendation:        - Discharge patient to home.                        - Resume previous diet.                        - Continue present medications.                        - Repeat colonoscopy in 5 years for surveillance.                        - Return to referring physician as previously                         scheduled. Procedure Code(s):     --- Professional ---                        [id number], Colorectal cancer screening; colonoscopy on  individual at high risk Diagnosis Code(s):     --- Professional ---                        Z86.010, Personal history of colonic polyps                        Z98.0, Intestinal bypass and anastomosis status CPT copyright 2022 [employer]. All rights reserved. The codes documented in this report are preliminary and upon coder review may  be revised to meet current compliance requirements. Andrey Farmer MD, MD 06/30/2022 10:00:30 AM Number of Addenda: 0 Note Initiated On: 06/30/2022 9:38 AM Scope Withdrawal Time: 0 hours 7 minutes 36 seconds  Total Procedure Duration: 0 hours 10 minutes 42 seconds  Estimated Blood Loss:  Estimated blood loss: none.      [facility]

## 2022-07-01 ENCOUNTER — Encounter: Payer: Self-pay | Admitting: Gastroenterology

## 2022-08-25 ENCOUNTER — Ambulatory Visit (INDEPENDENT_AMBULATORY_CARE_PROVIDER_SITE_OTHER): Payer: BC Managed Care – PPO | Admitting: Internal Medicine

## 2022-08-25 VITALS — BP 122/70 | HR 67 | Temp 98.0°F | Resp 16 | Ht 63.0 in | Wt 181.0 lb

## 2022-08-25 DIAGNOSIS — E7800 Pure hypercholesterolemia, unspecified: Secondary | ICD-10-CM

## 2022-08-25 DIAGNOSIS — R739 Hyperglycemia, unspecified: Secondary | ICD-10-CM

## 2022-08-25 DIAGNOSIS — R945 Abnormal results of liver function studies: Secondary | ICD-10-CM

## 2022-08-25 DIAGNOSIS — G4730 Sleep apnea, unspecified: Secondary | ICD-10-CM

## 2022-08-25 DIAGNOSIS — I25119 Atherosclerotic heart disease of native coronary artery with unspecified angina pectoris: Secondary | ICD-10-CM

## 2022-08-25 DIAGNOSIS — I10 Essential (primary) hypertension: Secondary | ICD-10-CM | POA: Diagnosis not present

## 2022-08-25 DIAGNOSIS — D582 Other hemoglobinopathies: Secondary | ICD-10-CM | POA: Diagnosis not present

## 2022-08-25 DIAGNOSIS — Z85038 Personal history of other malignant neoplasm of large intestine: Secondary | ICD-10-CM

## 2022-08-25 NOTE — Progress Notes (Signed)
Subjective:    [name]: Susan Weeks, female    DOB: 1951/04/24, 72 y.o.   MRN: [id number]  Patient here for  Chief Complaint  Patient presents with   Medical Management of Chronic Issues    HPI Here to follow up regarding her blood pressure and cholesterol.  Recently evaluated by GI - f/u elevated liver enzymes and recommended f/u colonoscopy.  Colonoscopy 06/2022 - unremarkable.  Recommended f/u in 5 years.  Also saw cardiology 02/28/22 - recommended starting repatha.  Doing well on repatha. Due labs.  She is walking daily.  No chest pain or sob reported.  No increased cough or congestion reported.  No abdominal pain or bowel change reported.  Still with soreness - left chest - from her surgery.  Wants to hold on mammogram.     Past Medical History:  Diagnosis Date   Abnormal liver function    Colon cancer (HCC)    adenocarcinoma of the cecum s/p partial colon resection   Coronary artery disease    a. 03/2021 Cor CTA: sev LAD dzs; b. 04/2021 Cath: LM nl, LAD 17m (unable to wire), D1 60, LCX nl, OM3 60, LPAV 100, RCA 90p; c. 05/2021 CABG x 4: LIMA->LAD, VG->RPDA, VG->OM1, VG->OM3.   History of colon polyps    Hyperlipidemia    Hypertension    Post-operative Atrial Fibrillation (HCC)    a. 05/2021 Brief PAF after CABG-->amio.   Sleep apnea 12/28/2017   Systolic murmur    a. 04/2021 Echo: EF 60-65%, no rwma, GrI DD, RVSP 28.40mmHg. Mild-mod MR.   Past Surgical History:  Procedure Laterality Date   ABDOMINAL HYSTERECTOMY  1984   secondary to bleeding and fibroid tumors   APPENDECTOMY     COLON SURGERY     COLONOSCOPY W/ POLYPECTOMY  2001   COLONOSCOPY WITH PROPOFOL N/A 07/28/2016   Procedure: COLONOSCOPY WITH PROPOFOL;  Surgeon: Christena Deem, MD;  Location[facility] ENDOSCOPY;  Service: Endoscopy;  Laterality: N/A;   COLONOSCOPY WITH PROPOFOL N/A 06/30/2022   Procedure: COLONOSCOPY WITH PROPOFOL;  Surgeon: Regis Bill, MD;  Location: [facility] ENDOSCOPY;  Service:  Endoscopy;  Laterality: N/A;   CORONARY ANGIOPLASTY     CORONARY ARTERY BYPASS GRAFT N/A 05/07/2021   Procedure: CORONARY ARTERY BYPASS GRAFTING (CABG) X 4, USING LEFT INTERNAL MAMMARY ARTERY, LEFT LEG GREATER SAPHENOUS VEIN HARVESTED ENDOSCOPICALLY;  Surgeon: Corliss Skains, MD;  Location: [facility] OR;  Service: Open Heart Surgery;  Laterality: N/A;   CORONARY STENT INTERVENTION N/A 04/15/2021   Procedure: CORONARY STENT INTERVENTION;  Surgeon: Iran Ouch, MD;  Location: [facility] INVASIVE CV LAB;  Service: Cardiovascular;  Laterality: N/A;   DILATION AND CURETTAGE OF UTERUS  1967   ENDOVEIN HARVEST OF GREATER SAPHENOUS VEIN Left 05/07/2021   Procedure: ENDOVEIN HARVEST OF GREATER SAPHENOUS VEIN;  Surgeon: Corliss Skains, MD;  Location: [facility] OR;  Service: Open Heart Surgery;  Laterality: Left;   IR THORACENTESIS ASP PLEURAL SPACE W/IMG GUIDE  05/10/2021   LEFT HEART CATH AND CORONARY ANGIOGRAPHY N/A 04/15/2021   Procedure: LEFT HEART CATH AND CORONARY ANGIOGRAPHY;  Surgeon: Iran Ouch, MD;  Location: [facility] INVASIVE CV LAB;  Service: Cardiovascular;  Laterality: N/A;   TEE WITHOUT CARDIOVERSION N/A 05/07/2021   Procedure: TRANSESOPHAGEAL ECHOCARDIOGRAM (TEE);  Surgeon: Corliss Skains, MD;  [facility] OR;  Service: Open Heart Surgery;  Laterality: N/A;   Family History  Problem Relation Age of Onset   Stroke Mother    Hypertension Mother  Cancer Father        Prostate    Hypertension Sister        x4   Heart attack Brother    Heart disease Brother        myocardial infarction   Hypertension Brother    Breast cancer Neg Hx    Social History   Socioeconomic History   Marital status: Married    Spouse name: Eddie   Number of children: 1   Years of education: HS   Highest education level: Not on file  Occupational History    Employer: OTHER    Comment: [employer]  Tobacco Use   Smoking status: Former    Years: 10    Types: Cigarettes    Quit date:  1978    Years since quitting: 46.3   Smokeless tobacco: Never   Tobacco comments:    1 pack per week--only smoked on the weekends.   Vaping Use   Vaping Use: Never used  Substance and Sexual Activity   Alcohol use: No    Alcohol/week: 0.0 standard drinks of alcohol   Drug use: No   Sexual activity: Not on file  Other Topics Concern   Not on file  Social History Narrative   Not on file   Social Determinants of Health   Financial Resource Strain: Not on file  Food Insecurity: Not on file  Transportation Needs: Not on file  Physical Activity: Not on file  Stress: Not on file  Social Connections: Not on file     Review of Systems  Constitutional:  Negative for appetite change and unexpected weight change.  HENT:  Negative for congestion and sinus pressure.   Respiratory:  Negative for cough, chest tightness and shortness of breath.   Cardiovascular:  Negative for chest pain, palpitations and leg swelling.  Gastrointestinal:  Negative for abdominal pain, diarrhea, nausea and vomiting.  Genitourinary:  Negative for difficulty urinating and dysuria.  Musculoskeletal:  Negative for joint swelling and myalgias.  Skin:  Negative for color change and rash.  Neurological:  Negative for dizziness and headaches.  Psychiatric/Behavioral:  Negative for agitation and dysphoric mood.        Objective:     BP 122/70   Pulse 67   Temp 98 F (36.7 C)   Resp 16   Ht 5\' 3"  (1.6 m)   Wt 181 lb (82.1 kg)   LMP 06/28/1982   SpO2 98%   BMI 32.06 kg/m  Wt Readings from Last 3 Encounters:  08/29/22 181 lb 2 oz (82.2 kg)  08/27/22 183 lb (83 kg)  08/25/22 181 lb (82.1 kg)    Physical Exam Vitals reviewed.  Constitutional:      General: She is not in acute distress.    Appearance: Normal appearance.  HENT:     Head: Normocephalic and atraumatic.     Right Ear: External ear normal.     Left Ear: External ear normal.  Eyes:     General: No scleral icterus.       Right eye: No  discharge.        Left eye: No discharge.     Conjunctiva/sclera: Conjunctivae normal.  Neck:     Thyroid: No thyromegaly.  Cardiovascular:     Rate and Rhythm: Normal rate and regular rhythm.  Pulmonary:     Effort: No respiratory distress.     Breath sounds: Normal breath sounds. No wheezing.     Comments: Breasts: no nipple discharge or  nipple retraction present.  No distinct nodules palpable.   Abdominal:     General: Bowel sounds are normal.     Palpations: Abdomen is soft.     Tenderness: There is no abdominal tenderness.  Musculoskeletal:        General: No swelling or tenderness.     Cervical back: Neck supple. No tenderness.  Lymphadenopathy:     Cervical: No cervical adenopathy.  Skin:    Findings: No erythema or rash.  Neurological:     Mental Status: She is alert.  Psychiatric:        Mood and Affect: Mood normal.        Behavior: Behavior normal.      Outpatient Encounter Medications as of 08/25/2022  Medication Sig   acetaminophen (TYLENOL) 500 MG tablet Take 500 mg by mouth every 6 (six) hours as needed for moderate pain.   amLODipine (NORVASC) 10 MG tablet Take 1 tablet (10 mg total) by mouth daily.   aspirin EC 325 MG EC tablet Take 1 tablet (325 mg total) by mouth daily.   Calcium Carbonate-Vitamin D (CALCIUM 600+D PO) Take 1 tablet by mouth daily.   metoprolol tartrate (LOPRESSOR) 25 MG tablet TAKE 1 TABLET BY MOUTH TWICE A DAY   NON FORMULARY Pt uses a cpap nightly   vitamin E 400 UNIT capsule Take 400 Units by mouth daily.    [DISCONTINUED] Evolocumab (REPATHA SURECLICK) 140 MG/ML SOAJ INJECT 140 MG INTO THE SKIN EVERY 14 (FOURTEEN) DAYS.   No facility-administered encounter medications on file as of 08/25/2022.     Lab Results  Component Value Date   WBC 4.4 01/09/2022   HGB 15.9 (H) 01/09/2022   HCT 46.9 (H) 01/09/2022   PLT 207 01/09/2022   GLUCOSE 93 08/28/2022   CHOL 136 08/28/2022   TRIG 85.0 08/28/2022   HDL 67.90 08/28/2022   LDLDIRECT  53.0 07/18/2021   LDLCALC 51 08/28/2022   ALT 44 (H) 08/28/2022   AST 27 08/28/2022   NA 139 08/28/2022   K 3.7 08/28/2022   CL 103 08/28/2022   CREATININE 0.83 08/28/2022   BUN 13 08/28/2022   CO2 27 08/28/2022   TSH 1.18 09/24/2021   INR 1.0 11/15/2021   HGBA1C 5.7 08/28/2022       Assessment & Plan:  Abnormal liver function Assessment & Plan: Off statin due to elevated liver function tests.  Being evaluated by oncology.  Dr Cathie Hoops - ordered f/u abdominal ultrasound - liver mass unchanged/smaller - favored benign lesion.  F/u Dr Cathie Hoops - f/u enhanced ultrasound recommended one year.  Follow liver function tests.    Coronary artery disease involving native coronary artery of native heart with angina pectoris [facility]) Assessment & Plan: Doing well on amlodipine, metoprolol.  Off statin due to liver enzyme elevation. No pain.  On repatha. Follow.    Elevated hemoglobin (HCC) Assessment & Plan: Followed by hematology as outlined.  Continue cpap.    Essential hypertension, benign Assessment & Plan: Had swelling on lisinopril.  On amlodipine. Blood pressure as outlined..  Follow pressures.  Follow metabolic panel.    History of colon cancer Assessment & Plan: Recently evaluated by GI - f/u elevated liver enzymes and recommended f/u colonoscopy.  Colonoscopy 06/2022 - unremarkable.  Recommended f/u in 5 years. Saw Dr Cathie Hoops 01/2022 - ordered CT chest/abd/pelvis.  Has f/u 09/2022.     Hyperglycemia Assessment & Plan: Low carb diet and exercise.  Follow met b and a1c.    Pure  hypercholesterolemia Assessment & Plan: On Repatha.  Low cholesterol diet and exercise.  Follow lipid panel and liver function tests.   Lab Results  Component Value Date   CHOL 136 08/28/2022   HDL 67.90 08/28/2022   LDLCALC 51 08/28/2022   LDLDIRECT 53.0 07/18/2021   TRIG 85.0 08/28/2022   CHOLHDL 2 08/28/2022     Sleep apnea, unspecified type Assessment & Plan: Continue cpap.      Dale Livingston,  MD

## 2022-08-27 ENCOUNTER — Encounter: Payer: Self-pay | Admitting: Pulmonary Disease

## 2022-08-27 ENCOUNTER — Ambulatory Visit (INDEPENDENT_AMBULATORY_CARE_PROVIDER_SITE_OTHER): Payer: BC Managed Care – PPO | Admitting: Pulmonary Disease

## 2022-08-27 VITALS — BP 120/80 | HR 74 | Temp 98.1°F | Ht 63.0 in | Wt 183.0 lb

## 2022-08-27 DIAGNOSIS — G4733 Obstructive sleep apnea (adult) (pediatric): Secondary | ICD-10-CM | POA: Diagnosis not present

## 2022-08-27 NOTE — Patient Instructions (Signed)
Call after August 2024 to get a new CPAP machine  Follow up in 1 year

## 2022-08-27 NOTE — Progress Notes (Signed)
[facility]  Chief Complaint  Patient presents with   Follow-up    Mask and Pressure are ok.     Constitutional:  BP 120/80 (BP Location: Left Arm, Cuff Size: Normal)   Pulse 74   Temp 98.1 F (36.7 C)   Ht  (1.6 m)   Wt 183 lb (83 kg)   LMP 06/28/1982   SpO2 97%   BMI 32.42 kg/m   Past Medical History:  HTN, HLD, Colon cancer, CAD s/p CABG  Past Surgical History:  Her  has a past surgical history that includes Appendectomy; Abdominal hysterectomy (1984); Colonoscopy with propofol (N/A, 07/28/2016); Colonoscopy w/ polypectomy (2001); LEFT HEART CATH AND CORONARY ANGIOGRAPHY (N/A, 04/15/2021); CORONARY STENT INTERVENTION (N/A, 04/15/2021); Dilation and curettage of uterus (1967); IR THORACENTESIS ASP PLEURAL SPACE W/IMG GUIDE (05/10/2021); Coronary artery bypass graft (N/A, 05/07/2021); TEE without cardioversion (N/A, 05/07/2021); Endoharvest vein of greater saphenous vein (Left, 05/07/2021); Colon surgery; Coronary angioplasty; and Colonoscopy with propofol (N/A, 06/30/2022).  Brief Summary:  Susan Weeks is a 72 y.o. female with obstructive sleep apnea.      Subjective:   Gets sweating around her mask.  Has a hybrid mask.  Pressure comfortable.  Sleeping well.  Not having sinus congestion or dry mouth.  Breathing good.  Walks every day for exercise.  Physical Exam:   Appearance - well kempt   ENMT - no sinus tenderness, no oral exudate, no LAN, Mallampati 3 airway, no stridor  Respiratory - equal breath sounds bilaterally, no wheezing or rales  CV - s1s2 regular rate and rhythm, no murmurs  Ext - no clubbing, no edema  Skin - no rashes  Psych - normal mood and affect     Chest Imaging:  Sniff test 07/24/21 >> paradoxical motion of Lt diaphragm with inspiration CT chest 02/05/22 >> scarring at lt base with elevated Lt diaphragm  Sleep Tests:  HST 12/28/17 >> AHI 8.5, SpO2 low 72% Auto CPAP 07/27/22 to 08/25/22  >> used on 30 of 30 nights with average 6 hrs 54 min.  Average AHI 0.4 with median CPAP 10 and 95 th percentile CPAP 14 cm H2O  Cardiac Tests:  Echo 04/19/21 >> EF 60 to 65%, grade 1 DD, RVSP 28.8 mmHg, mild/mod MR  Social History:  She  reports that she quit smoking about 46 years ago. Her smoking use included cigarettes. She has never used smokeless tobacco. She reports that she does not drink alcohol and does not use drugs.  Family History:  Her family history includes Cancer in her father; Heart attack in her brother; Heart disease in her brother; Hypertension in her brother, mother, and sister; Stroke in her mother.     Assessment/Plan:   Obstructive sleep apnea. - she is compliant with CPAP and reports benefit from therapy - she used [employer] for her DME - current CPAP was ordered in August 2019 - continue auto CPAP 5 to 15 cm H2O  CAD s/p CABG. - followed by Dr. Lorine Bears with cardiology  Lt diaphragm paralysis after CABG in January 2023. - no significant respiratory symptoms  Secondary polycythemia. - followed by Dr. Rickard Patience with hematology/oncology at [facility] in [place]  Time Spent Involved in Patient Care on Day of Examination:  25 minutes  Follow up:   Patient Instructions  Call after August 2024 to get a new CPAP machine  Follow up in 1 year  Medication List:   Allergies as  of 08/27/2022       Reactions   Silicone Other (See Comments)   Bruising from paper tape   Tape    Bruising from paper tape   Lisinopril Rash   Developed slight facial swelling and itching over forehead while taking. Unclear if definite ACE allergy, but concern enough to stop in ER today.   Z-pak [azithromycin] Nausea And Vomiting   Caused Emesis        Medication List        Accurate as of August 27, 2022  1:49 PM. If you have any questions, ask your nurse or doctor.          acetaminophen 500 MG tablet Commonly known as: TYLENOL Take 500 mg by  mouth every 6 (six) hours as needed for moderate pain.   amLODipine 10 MG tablet Commonly known as: NORVASC Take 1 tablet (10 mg total) by mouth daily.   aspirin EC 325 MG tablet Take 1 tablet (325 mg total) by mouth daily.   CALCIUM 600+D PO Take 1 tablet by mouth daily.   metoprolol tartrate 25 MG tablet Commonly known as: LOPRESSOR TAKE 1 TABLET BY MOUTH TWICE A DAY   NON FORMULARY Pt uses a cpap nightly   Repatha SureClick 140 MG/ML Soaj Generic drug: Evolocumab INJECT 140 MG INTO THE SKIN EVERY 14 (FOURTEEN) DAYS.   vitamin E 180 MG (400 UNITS) capsule Take 400 Units by mouth daily.        Signature:  Coralyn Helling, MD [facility]monary/Critical Care Pager - [phone] 08/27/2022, 1:49 PM

## 2022-08-28 ENCOUNTER — Other Ambulatory Visit (INDEPENDENT_AMBULATORY_CARE_PROVIDER_SITE_OTHER): Payer: BC Managed Care – PPO

## 2022-08-28 DIAGNOSIS — R739 Hyperglycemia, unspecified: Secondary | ICD-10-CM | POA: Diagnosis not present

## 2022-08-28 DIAGNOSIS — I10 Essential (primary) hypertension: Secondary | ICD-10-CM

## 2022-08-28 DIAGNOSIS — E7800 Pure hypercholesterolemia, unspecified: Secondary | ICD-10-CM | POA: Diagnosis not present

## 2022-08-28 DIAGNOSIS — R945 Abnormal results of liver function studies: Secondary | ICD-10-CM | POA: Diagnosis not present

## 2022-08-28 LAB — LIPID PANEL
Cholesterol: 136 mg/dL (ref 0–200)
HDL: 67.9 mg/dL (ref >39.00)
LDL Cholesterol: 51 mg/dL (ref 0–99)
NonHDL: 68.46
Total CHOL/HDL Ratio: 2
Triglycerides: 85 mg/dL (ref 0.0–149.0)
VLDL: 17 mg/dL (ref 0.0–40.0)

## 2022-08-28 LAB — BASIC METABOLIC PANEL
BUN: 13 mg/dL (ref 6–23)
CO2: 27 mEq/L (ref 19–32)
Calcium: 8.7 mg/dL (ref 8.4–10.5)
Chloride: 103 mEq/L (ref 96–112)
Creatinine, Ser: 0.83 mg/dL (ref 0.40–1.20)
GFR: 70.25 mL/min (ref >60.00)
Glucose, Bld: 93 mg/dL (ref 70–99)
Potassium: 3.7 mEq/L (ref 3.5–5.1)
Sodium: 139 mEq/L (ref 135–145)

## 2022-08-28 LAB — HEPATIC FUNCTION PANEL
ALT: 44 U/L — ABNORMAL HIGH (ref 0–35)
AST: 27 U/L (ref 0–37)
Albumin: 4 g/dL (ref 3.5–5.2)
Alkaline Phosphatase: 93 U/L (ref 39–117)
Bilirubin, Direct: 0.2 mg/dL (ref 0.0–0.3)
Total Bilirubin: 0.8 mg/dL (ref 0.2–1.2)
Total Protein: 6.6 g/dL (ref 6.0–8.3)

## 2022-08-28 LAB — HEMOGLOBIN A1C: Hgb A1c MFr Bld: 5.7 % (ref 4.6–6.5)

## 2022-08-29 ENCOUNTER — Encounter: Payer: Self-pay | Admitting: Cardiovascular Disease

## 2022-08-29 ENCOUNTER — Ambulatory Visit: Payer: BC Managed Care – PPO | Attending: Cardiovascular Disease | Admitting: Cardiovascular Disease

## 2022-08-29 VITALS — BP 110/60 | HR 63 | Ht 63.0 in | Wt 181.1 lb

## 2022-08-29 DIAGNOSIS — I2510 Atherosclerotic heart disease of native coronary artery without angina pectoris: Secondary | ICD-10-CM

## 2022-08-29 DIAGNOSIS — E785 Hyperlipidemia, unspecified: Secondary | ICD-10-CM | POA: Diagnosis not present

## 2022-08-29 DIAGNOSIS — I10 Essential (primary) hypertension: Secondary | ICD-10-CM

## 2022-08-29 DIAGNOSIS — R011 Cardiac murmur, unspecified: Secondary | ICD-10-CM | POA: Diagnosis not present

## 2022-08-29 MED ORDER — REPATHA SURECLICK 140 MG/ML ~~LOC~~ SOAJ: 140.0000 mg | SUBCUTANEOUS | 3 refills | Status: DC

## 2022-08-29 NOTE — Patient Instructions (Signed)
Medication Instructions:  No changes *If you need a refill on your cardiac medications before your next appointment, please call your pharmacy*   Lab Work: None ordered If you have labs (blood work) drawn today and your tests are completely normal, you will receive your results only by: MyChart Message (if you have MyChart) OR A paper copy in the mail If you have any lab test that is abnormal or we need to change your treatment, we will call you to review the results.   Testing/Procedures: None ordered   Follow-Up: At [facility], you and your health needs are our priority.  As part of our continuing mission to provide you with exceptional heart care, we have created designated Provider Care Teams.  These Care Teams include your primary Cardiologist (physician) and Advanced Practice Providers (APPs -  Physician Assistants and Nurse Practitioners) who all work together to provide you with the care you need, when you need it.  We recommend signing up for the patient portal called "MyChart".  Sign up information is provided on this After Visit Summary.  MyChart is used to connect with patients for Virtual Visits (Telemedicine).  Patients are able to view lab/test results, encounter notes, upcoming appointments, etc.  Non-urgent messages can be sent to your provider as well.   To learn more about what you can do with MyChart, go to [email or web address].    Your next appointment:   6 month(s)  Provider:   You may see Muhammad Arida, MD or one of the following Advanced Practice Providers on your designated Care Team:   Christopher Berge, NP Ryan Dunn, PA-C Cadence Furth, PA-C Sheri Hammock, NP    

## 2022-08-29 NOTE — Progress Notes (Signed)
Cardiology Office Note   Date:  08/29/2022   ID:  MCKINNLEY COTTIER, DOB 25-Jan-1951, MRN [id number]  PCP:  Dale Burdett, MD  Cardiologist:   Lorine Bears, MD   Chief Complaint  Patient presents with   Follow-up    6 month f/u pt would like to discuss cholesterol results. Meds reviewed verbally with pt.      History of Present Illness: Susan Weeks is a 72 y.o. female who is here today for follow-up visit regarding coronary artery disease status post CABG.   She has known history of essential hypertension, hyperlipidemia, erythrocytosis thought to be due to untreated sleep apnea which improved after starting CPAP treatment and adenocarcinoma of the cecum status post partial colon resection. She quit smoking in 1978.   She was seen in November 2022 due to chest pain and exertional dyspnea.  Cardiac CTA showed severe LAD disease.  Echocardiogram showed normal LV systolic function with mild to moderate mitral regurgitation.  Cardiac catheterization was done in December 2022 which showed occluded LAD and severe RCA disease. The LAD occlusion could not be crossed with a wire and thus she was referred for surgical revascularization.  She underwent CABG x 4 in January 2023.  She had brief postoperative atrial fibrillation. She had persistently elevated liver enzymes on statin therapy and thus she was switched to PCSK9 inhibitor.  She has been doing extremely well with no chest pain, shortness of breath or palpitations.    Past Medical History:  Diagnosis Date   Abnormal liver function    Colon cancer (HCC)    adenocarcinoma of the cecum s/p partial colon resection   Coronary artery disease    a. 03/2021 Cor CTA: sev LAD dzs; b. 04/2021 Cath: LM nl, LAD 170m (unable to wire), D1 60, LCX nl, OM3 60, LPAV 100, RCA 90p; c. 05/2021 CABG x 4: LIMA->LAD, VG->RPDA, VG->OM1, VG->OM3.   History of colon polyps    Hyperlipidemia    Hypertension    Post-operative Atrial Fibrillation (HCC)     a. 05/2021 Brief PAF after CABG-->amio.   Sleep apnea 12/28/2017   Systolic murmur    a. 04/2021 Echo: EF 60-65%, no rwma, GrI DD, RVSP 28.59mmHg. Mild-mod MR.    Past Surgical History:  Procedure Laterality Date   ABDOMINAL HYSTERECTOMY  1984   secondary to bleeding and fibroid tumors   APPENDECTOMY     COLON SURGERY     COLONOSCOPY W/ POLYPECTOMY  2001   COLONOSCOPY WITH PROPOFOL N/A 07/28/2016   Procedure: COLONOSCOPY WITH PROPOFOL;  Surgeon: Christena Deem, MD;  Location: [facility] ENDOSCOPY;  Service: Endoscopy;  Laterality: N/A;   COLONOSCOPY WITH PROPOFOL N/A 06/30/2022   Procedure: COLONOSCOPY WITH PROPOFOL;  Surgeon: Regis Bill, MD;  Location: [facility] ENDOSCOPY;  Service: Endoscopy;  Laterality: N/A;   CORONARY ANGIOPLASTY     CORONARY ARTERY BYPASS GRAFT N/A 05/07/2021   Procedure: CORONARY ARTERY BYPASS GRAFTING (CABG) X 4, USING LEFT INTERNAL MAMMARY ARTERY, LEFT LEG GREATER SAPHENOUS VEIN HARVESTED ENDOSCOPICALLY;  Surgeon: Corliss Skains, MD;  Location: [facility] OR;  Service: Open Heart Surgery;  Laterality: N/A;   CORONARY STENT INTERVENTION N/A 04/15/2021   Procedure: CORONARY STENT INTERVENTION;  Surgeon: Iran Ouch, MD;  Location: [facility] INVASIVE CV LAB;  Service: Cardiovascular;  Laterality: N/A;   DILATION AND CURETTAGE OF UTERUS  1967   ENDOVEIN HARVEST OF GREATER SAPHENOUS VEIN Left 05/07/2021   Procedure: ENDOVEIN HARVEST OF GREATER SAPHENOUS VEIN;  Surgeon: Brynda Greathouse  O, MD;  Location: [facility] OR;  Service: Open Heart Surgery;  Laterality: Left;   IR THORACENTESIS ASP PLEURAL SPACE W/IMG GUIDE  05/10/2021   LEFT HEART CATH AND CORONARY ANGIOGRAPHY N/A 04/15/2021   Procedure: LEFT HEART CATH AND CORONARY ANGIOGRAPHY;  Surgeon: Iran Ouch, MD;  Location: [facility] INVASIVE CV LAB;  Service: Cardiovascular;  Laterality: N/A;   TEE WITHOUT CARDIOVERSION N/A 05/07/2021   Procedure: TRANSESOPHAGEAL ECHOCARDIOGRAM (TEE);  Surgeon: Corliss Skains, MD;   Location: [facility] OR;  Service: Open Heart Surgery;  Laterality: N/A;     Current Outpatient Medications  Medication Sig Dispense Refill   acetaminophen (TYLENOL) 500 MG tablet Take 500 mg by mouth every 6 (six) hours as needed for moderate pain.     amLODipine (NORVASC) 10 MG tablet Take 1 tablet (10 mg total) by mouth daily. 90 tablet 3   aspirin EC 325 MG EC tablet Take 1 tablet (325 mg total) by mouth daily.     Calcium Carbonate-Vitamin D (CALCIUM 600+D PO) Take 1 tablet by mouth daily.     metoprolol tartrate (LOPRESSOR) 25 MG tablet TAKE 1 TABLET BY MOUTH TWICE A DAY 180 tablet 3   NON FORMULARY Pt uses a cpap nightly     vitamin E 400 UNIT capsule Take 400 Units by mouth daily.      Evolocumab (REPATHA SURECLICK) 140 MG/ML SOAJ Inject 140 mg into the skin every 14 (fourteen) days. 6 mL 3   No current facility-administered medications for this visit.    Allergies:   Silicone, Tape, Lisinopril, and Z-pak [azithromycin]    Social History:  The patient  reports that she quit smoking about 46 years ago. Her smoking use included cigarettes. She has never used smokeless tobacco. She reports that she does not drink alcohol and does not use drugs.   Family History:  The patient's family history includes Cancer in her father; Heart attack in her brother; Heart disease in her brother; Hypertension in her brother, mother, and sister; Stroke in her mother.    ROS:  Please see the history of present illness.   Otherwise, review of systems are positive for none.   All other systems are reviewed and negative.    PHYSICAL EXAM: VS:  BP 110/60 (BP Location: Left Arm, Patient Position: Sitting, Cuff Size: Normal)   Pulse 63   Ht 5\' 3"  (1.6 m)   Wt 181 lb 2 oz (82.2 kg)   LMP 06/28/1982   SpO2 99%   BMI 32.08 kg/m  , BMI Body mass index is 32.08 kg/m. GEN: Well nourished, well developed, in no acute distress  HEENT: normal  Neck: no JVD, carotid bruits, or masses Cardiac: RRR; no rubs, or  gallops.  2 out of 6 systolic murmur in the aortic area.  Mild right leg edema Respiratory:  clear to auscultation bilaterally, normal work of breathing GI: soft, nontender, nondistended, + BS MS: no deformity or atrophy  Skin: warm and dry, no rash Neuro:  Strength and sensation are intact Psych: euthymic mood, full affect   EKG:  EKG is ordered today. The ekg ordered today demonstrates normal sinus rhythm with anterolateral infarct.  No significant ST changes.  Recent Labs: 09/24/2021: TSH 1.18 01/09/2022: Hemoglobin 15.9; Platelets 207 08/28/2022: ALT 44; BUN 13; Creatinine, Ser 0.83; Potassium 3.7; Sodium 139    Lipid Panel    Component Value Date/Time   CHOL 136 08/28/2022 0813   TRIG 85.0 08/28/2022 0813   HDL 67.90 08/28/2022 0813  CHOLHDL 2 08/28/2022 0813   VLDL 17.0 08/28/2022 0813   LDLCALC 51 08/28/2022 0813   LDLDIRECT 53.0 07/18/2021 0851      Wt Readings from Last 3 Encounters:  08/29/22 181 lb 2 oz (82.2 kg)  08/27/22 183 lb (83 kg)  08/25/22 181 lb (82.1 kg)          03/19/2021    1:56 PM  PAD Screen  Previous PAD dx? No  Previous surgical procedure? Yes  Pain with walking? No  Feet/toe relief with dangling? No  Painful, non-healing ulcers? No  Extremities discolored? No      ASSESSMENT AND PLAN:  1.  Coronary artery disease involving native coronary arteries without angina: She is doing extremely well post CABG with no residual anginal symptoms.  Continue aspirin daily and treatment of risk factors.  2.  Cardiac murmur: Stable and suggestive of aortic sclerosis.  No significant stenosis on echocardiogram last year.  3.  Essential hypertension: Blood pressure is r well-controlled on current medications.  4.  Hyperlipidemia: She has a evaded liver enzymes on statins and currently doing very well with Repatha.  I reviewed the results of her labs from yesterday with her.  Her LDL was 51 with triglyceride of 85.  Liver enzymes were almost back to  normal with the exception of mildly elevated ALT at 44.    Disposition: Follow-up in 6 months.  Signed,  Lorine Bears, MD  08/29/2022 1:17 PM    [facility]

## 2022-09-01 ENCOUNTER — Encounter: Payer: Self-pay | Admitting: Internal Medicine

## 2022-09-01 NOTE — Assessment & Plan Note (Signed)
Followed by hematology as outlined.  Continue cpap.  

## 2022-09-01 NOTE — Assessment & Plan Note (Addendum)
Recently evaluated by GI - f/u elevated liver enzymes and recommended f/u colonoscopy.  Colonoscopy 06/2022 - unremarkable.  Recommended f/u in 5 years. Saw Dr Cathie Hoops 01/2022 - ordered CT chest/abd/pelvis.  Has f/u 09/2022.

## 2022-09-01 NOTE — Assessment & Plan Note (Signed)
Continue cpap.  

## 2022-09-01 NOTE — Assessment & Plan Note (Signed)
Had swelling on lisinopril.  On amlodipine. Blood pressure as outlined..  Follow pressures.  Follow metabolic panel.  

## 2022-09-01 NOTE — Assessment & Plan Note (Signed)
Doing well on amlodipine, metoprolol.  Off statin due to liver enzyme elevation. No pain.  On repatha. Follow.  

## 2022-09-01 NOTE — Assessment & Plan Note (Signed)
Low carb diet and exercise.  Follow met b and a1c.   

## 2022-09-01 NOTE — Assessment & Plan Note (Signed)
Off statin due to elevated liver function tests.  Being evaluated by oncology.  Dr Cathie Hoops - ordered f/u abdominal ultrasound - liver mass unchanged/smaller - favored benign lesion.  F/u Dr Cathie Hoops - f/u enhanced ultrasound recommended one year.  Follow liver function tests.

## 2022-09-01 NOTE — Assessment & Plan Note (Signed)
On Repatha.  Low cholesterol diet and exercise.  Follow lipid panel and liver function tests.   Lab Results  Component Value Date   CHOL 136 08/28/2022   HDL 67.90 08/28/2022   LDLCALC 51 08/28/2022   LDLDIRECT 53.0 07/18/2021   TRIG 85.0 08/28/2022   CHOLHDL 2 08/28/2022

## 2022-09-01 NOTE — Addendum Note (Signed)
Addended by: Auburn Bilberry D on: 09/01/2022 10:04 AM   Modules accepted: Orders

## 2022-09-08 ENCOUNTER — Telehealth: Payer: Self-pay | Admitting: Cardiovascular Disease

## 2022-09-08 NOTE — Telephone Encounter (Signed)
Pt c/o medication issue:  1. Name of Medication:  Evolocumab (REPATHA SURECLICK) 140 MG/ML SOAJ  2. How are you currently taking this medication (dosage and times per day)?   3. Are you having a reaction (difficulty breathing--STAT)?   4. What is your medication issue?   Patient states when she attempted to inject her medication, she realized that the syringe was faulty and not working. She contacted the manufacturer. They are sending her more medication but they will need to confirm with the prescriber before distributing it again. She states they are sending something to the office regarding this and they will need confirmation from the office.

## 2022-09-10 ENCOUNTER — Telehealth: Payer: Self-pay | Admitting: Cardiovascular Disease

## 2022-09-10 NOTE — Telephone Encounter (Signed)
Verbal permission provided to [facility] to ship a new repatha pen due to damage pen.

## 2022-09-10 NOTE — Telephone Encounter (Signed)
Pt c/o medication issue:  1. Name of Medication:   Evolocumab (REPATHA SURECLICK) 140 MG/ML SOAJ    2. How are you currently taking this medication (dosage and times per day)? Inject 140 mg into the skin every 14 (fourteen) days.   3. Are you having a reaction (difficulty breathing--STAT)? No  4. What is your medication issue? The pharmacy was calling because the package for the medication was damaged during shipping. The pharmacy needs a verbal okay from the nurse that it is okay to reship this medication for the patient. The pharmacy gave the case number of [id number] Please advise.

## 2022-09-22 ENCOUNTER — Telehealth: Payer: Self-pay | Admitting: Internal Medicine

## 2022-09-22 NOTE — Telephone Encounter (Signed)
Received notification that she is overdue a mammogram.  Please call and schedule for pt.

## 2022-09-22 NOTE — Telephone Encounter (Signed)
Per note on 08/25/22, patient requested to hold on mammogram.

## 2022-09-25 ENCOUNTER — Inpatient Hospital Stay: Payer: BC Managed Care – PPO | Attending: Oncology

## 2022-09-25 DIAGNOSIS — Z7982 Long term (current) use of aspirin: Secondary | ICD-10-CM | POA: Insufficient documentation

## 2022-09-25 DIAGNOSIS — Z87891 Personal history of nicotine dependence: Secondary | ICD-10-CM | POA: Diagnosis not present

## 2022-09-25 DIAGNOSIS — D751 Secondary polycythemia: Secondary | ICD-10-CM | POA: Diagnosis present

## 2022-09-25 DIAGNOSIS — Z8042 Family history of malignant neoplasm of prostate: Secondary | ICD-10-CM | POA: Insufficient documentation

## 2022-09-25 DIAGNOSIS — Z79899 Other long term (current) drug therapy: Secondary | ICD-10-CM | POA: Diagnosis not present

## 2022-09-25 DIAGNOSIS — Z85038 Personal history of other malignant neoplasm of large intestine: Secondary | ICD-10-CM | POA: Insufficient documentation

## 2022-09-25 DIAGNOSIS — G4730 Sleep apnea, unspecified: Secondary | ICD-10-CM | POA: Insufficient documentation

## 2022-09-25 DIAGNOSIS — N839 Noninflammatory disorder of ovary, fallopian tube and broad ligament, unspecified: Secondary | ICD-10-CM | POA: Insufficient documentation

## 2022-09-25 LAB — CBC WITH DIFFERENTIAL/PLATELET
Abs Immature Granulocytes: 0.01 10*3/uL (ref 0.00–0.07)
Basophils Absolute: 0 10*3/uL (ref 0.0–0.1)
Basophils Relative: 1 %
Eosinophils Absolute: 0.1 10*3/uL (ref 0.0–0.5)
Eosinophils Relative: 1 %
HCT: 47.2 % — ABNORMAL HIGH (ref 36.0–46.0)
Hemoglobin: 16.1 g/dL — ABNORMAL HIGH (ref 12.0–15.0)
Immature Granulocytes: 0 %
Lymphocytes Relative: 42 %
Lymphs Abs: 1.8 10*3/uL (ref 0.7–4.0)
MCH: 30 pg (ref 26.0–34.0)
MCHC: 34.1 g/dL (ref 30.0–36.0)
MCV: 87.9 fL (ref 80.0–100.0)
Monocytes Absolute: 0.3 10*3/uL (ref 0.1–1.0)
Monocytes Relative: 8 %
Neutro Abs: 2 10*3/uL (ref 1.7–7.7)
Neutrophils Relative %: 48 %
Platelets: 218 10*3/uL (ref 150–400)
RBC: 5.37 MIL/uL — ABNORMAL HIGH (ref 3.87–5.11)
RDW: 12.6 % (ref 11.5–15.5)
WBC: 4.2 10*3/uL (ref 4.0–10.5)
nRBC: 0 % (ref 0.0–0.2)

## 2022-09-25 LAB — COMPREHENSIVE METABOLIC PANEL
ALT: 74 U/L — ABNORMAL HIGH (ref 0–44)
AST: 55 U/L — ABNORMAL HIGH (ref 15–41)
Albumin: 4.3 g/dL (ref 3.5–5.0)
Alkaline Phosphatase: 123 U/L (ref 38–126)
Anion gap: 9 (ref 5–15)
BUN: 18 mg/dL (ref 8–23)
CO2: 26 mmol/L (ref 22–32)
Calcium: 9 mg/dL (ref 8.9–10.3)
Chloride: 105 mmol/L (ref 98–111)
Creatinine, Ser: 1.05 mg/dL — ABNORMAL HIGH (ref 0.44–1.00)
GFR, Estimated: 56 mL/min — ABNORMAL LOW (ref >60)
Glucose, Bld: 99 mg/dL (ref 70–99)
Potassium: 4.2 mmol/L (ref 3.5–5.1)
Sodium: 140 mmol/L (ref 135–145)
Total Bilirubin: 0.9 mg/dL (ref 0.3–1.2)
Total Protein: 7.2 g/dL (ref 6.5–8.1)

## 2022-09-26 ENCOUNTER — Other Ambulatory Visit: Payer: Self-pay | Admitting: Nurse Practitioner

## 2022-09-26 LAB — CEA: CEA: 6 ng/mL — ABNORMAL HIGH (ref 0.0–4.7)

## 2022-09-30 ENCOUNTER — Encounter: Payer: Self-pay | Admitting: Oncology

## 2022-09-30 ENCOUNTER — Inpatient Hospital Stay (HOSPITAL_BASED_OUTPATIENT_CLINIC_OR_DEPARTMENT_OTHER): Payer: BC Managed Care – PPO | Admitting: Oncology

## 2022-09-30 VITALS — BP 136/65 | HR 72 | Temp 96.7°F | Resp 18 | Wt 182.1 lb

## 2022-09-30 DIAGNOSIS — K769 Liver disease, unspecified: Secondary | ICD-10-CM | POA: Insufficient documentation

## 2022-09-30 DIAGNOSIS — D751 Secondary polycythemia: Secondary | ICD-10-CM

## 2022-09-30 DIAGNOSIS — N838 Other noninflammatory disorders of ovary, fallopian tube and broad ligament: Secondary | ICD-10-CM | POA: Insufficient documentation

## 2022-09-30 DIAGNOSIS — Z85038 Personal history of other malignant neoplasm of large intestine: Secondary | ICD-10-CM

## 2022-09-30 DIAGNOSIS — R970 Elevated carcinoembryonic antigen [CEA]: Secondary | ICD-10-CM | POA: Diagnosis not present

## 2022-09-30 NOTE — Assessment & Plan Note (Signed)
#  History of colon cancer, chronically elevated CEA, October 2023 CT chest abdomen pelvis showed no evidence of cancer recurrence.

## 2022-09-30 NOTE — Assessment & Plan Note (Signed)
No activity on previous PET scan.  Unchanged on recent CT scan.  Likely benign or indolent process.  I recommend to obtain MRI pelvis with and without contrast for further characterization.  She agrees with the plan

## 2022-09-30 NOTE — Progress Notes (Signed)
Hematology/Oncology Progress note [phone] [phone]      Patient Care Team: Dale Shelley, MD as PCP - General (Internal Medicine) Iran Ouch, MD as PCP - Cardiology (Cardiology)  REASON FOR VISIT Follow up for treatment of erythrocytosis, liver lesion and adnexal lesion  ASSESSMENT & PLAN:   Liver lesion # liver lesions she has had extensive work up including No hypermetabolism on PET, Enhanced US showed likely benign hemangioma and biopsy was not proceeded. 10/24/2021 enhanced US showed right hepatic dome solid mass is smaller comparing to prior, similar contrast-enhancement characteristics.  Favor benign etiology.  Left hepatic mass appears similar.  Plan to repeat enhanced ultrasound   Ovarian mass No activity on previous PET scan.  Unchanged on recent CT scan.  Likely benign or indolent process.  I recommend to obtain MRI pelvis with and without contrast for further characterization.  She agrees with the plan  History of colon cancer #History of colon cancer, chronically elevated CEA, October 2023 CT chest abdomen pelvis showed no evidence of cancer recurrence.  Erythrocytosis #Secondary erythrocytosis secondary to sleep apnea Previous work up JAK2 V617F mutation negative, with reflex to other mutations CALR, MPL, JAK 2 Ex 12-15 mutations negative. BCR ABL mutation negative She is compliant with CPAP machine.  No need for phlebotomy at this point.  Continue observation.  Orders Placed This Encounter  Procedures   US LIVER W/CM 1ST LESION    Standing Status:   Future    Standing Expiration Date:   09/30/2023    Order Specific Question:   Reason for Exam (SYMPTOM  OR DIAGNOSIS REQUIRED)    Answer:   liver mass    Comments:   to be done by Dr. Elby Showers    Order Specific Question:   Preferred imaging location?    Answer:   [facility]   US ABDOMEN LIMITED RUQ (LIVER/GB)    Standing Status:   Future    Standing Expiration Date:    09/30/2023    Order Specific Question:   Reason for Exam (SYMPTOM  OR DIAGNOSIS REQUIRED)    Answer:   liver lesion    Order Specific Question:   Preferred imaging location?    Answer:   [facility]   MR PELVIS W WO CONTRAST    Standing Status:   Future    Standing Expiration Date:   09/30/2023    Order Specific Question:   If indicated for the ordered procedure, I authorize the administration of contrast media per Radiology protocol    Answer:   Yes    Order Specific Question:   What is the patient's sedation requirement?    Answer:   No Sedation    Order Specific Question:   Does the patient have a pacemaker or implanted devices?    Answer:   No    Order Specific Question:   Preferred imaging location?    Answer:   [facility] (table limit - 550lbs)   CMP (Cancer Center only)    Standing Status:   Future    Standing Expiration Date:   09/30/2023   CBC with Differential (Cancer Center Only)    Standing Status:   Future    Standing Expiration Date:   09/30/2023   CEA    Standing Status:   Future    Standing Expiration Date:   09/30/2023   Follow-up in 6 months. All questions were answered. The patient knows to call the clinic with any problems, questions or concerns.  Janyth Contes  Cathie Hoops, MD, PhD [facility] Hematology Oncology 09/30/2022    HISTORY OF PRESENTING ILLNESS:  Susan Weeks is a  72 y.o.  female with PMH listed below who was referred to me for evaluation of elevated hemoglobin.  Recent hemoglobin 16.2, hct 47.7, reviewed her previous labs, hemoglobin has been chronically high since 2017, ranging from 15.1 to 16.2.   Pertinent Oncology Histroy  Previous history of carcinoma of colon status post resection, has an intermittent rising CEA.  Status post resection, no evidence of malignancy.-2011 Patient had exploratory laparotomy because of abnormal PET scan following rising CEA and was negative for any malignancy. CEA  remains stable in the range of 5.0    #Erythrocytosis was thought to be secondary, negative for Jak 2 mutation with reflex, BCR ABL mutation negative. # Sleep Apnea, patient establish care with Dr. Terrial Rhodes.   on CPAP machine.  # 03/19/2020, ultrasound abdomen complete showed indeterminate lesions in the dome of liver.  Recommend MRI. 04/04/2020, patient had MRI abdomen with and without contrast Focal hepatic lesions [right dome2.3 x 1.9 cm, left lateral segment ] and in the right and left hepatic lobes, suspicious for metastatic disease. There is also a potential solid right adnexal lesion measuring 2.6 x 2.4 cm.  Potential ovarian neoplasm.  04/15/2020 US pelvis showed  Questionable visualization of a tiny RIGHT ovary containing 6 mm and 5 mm cysts; no follow-up imaging recommended. Questionable 3.4 cm diameter shadowing mass in RIGHT pelvis versus artifact related to stool within the colon; recommend dedicated CT imaging of the pelvis with IV contrast to assess.  04/24/2020 PET scan showed the liver lesions are not hypermetabolic.    05/02/2021 Liver lesion was further evaluated by contrast enhanced [place] and Hypoechoic mass in the dome of the right lobe of the liver measuring up to 3.9 cm which demonstrates very early, flash filling diffuse enhancement without evidence of washout. These findings are most compatible with a flash filling hemangioma. Biopsy was not proceeded.   She had CABG x 4 on 05/07/21, hospitalization was complicated by Atrial fibrilliation.   INTERVAL HISTORY Susan Weeks is a 72 y.o. female who has above history reviewed by me today presents for follow-up visit for management of erythrocytosis, history of colon cancer. Patient reports feeling well.  No new complaints. Denies any change of bowel habits, blood in his stool, abdominal pain.   Review of Systems  Constitutional:  Negative for chills, fever, malaise/fatigue and weight loss.  HENT:  Negative for sore throat.   Eyes:  Negative for redness.   Respiratory:  Negative for cough, shortness of breath and wheezing.   Cardiovascular:  Negative for chest pain, palpitations and leg swelling.  Gastrointestinal:  Negative for abdominal pain, blood in stool, nausea and vomiting.  Genitourinary:  Negative for dysuria.  Musculoskeletal:  Negative for myalgias.  Skin:  Negative for rash.  Neurological:  Negative for dizziness, tingling and tremors.  Endo/Heme/Allergies:  Does not bruise/bleed easily.  Psychiatric/Behavioral:  Negative for hallucinations.     MEDICAL HISTORY:  Past Medical History:  Diagnosis Date   Abnormal liver function    Colon cancer (HCC)    adenocarcinoma of the cecum s/p partial colon resection   Coronary artery disease    a. 03/2021 Cor CTA: sev LAD dzs; b. 04/2021 Cath: LM nl, LAD 116m (unable to wire), D1 60, LCX nl, OM3 60, LPAV 100, RCA 90p; c. 05/2021 CABG x 4: LIMA->LAD, VG->RPDA, VG->OM1, VG->OM3.   History of colon polyps  Hyperlipidemia    Hypertension    Post-operative Atrial Fibrillation (HCC)    a. 05/2021 Brief PAF after CABG-->amio.   Sleep apnea 12/28/2017   Systolic murmur    a. 04/2021 Echo: EF 60-65%, no rwma, GrI DD, RVSP 28.23mmHg. Mild-mod MR.    SURGICAL HISTORY: Past Surgical History:  Procedure Laterality Date   ABDOMINAL HYSTERECTOMY  1984   secondary to bleeding and fibroid tumors   APPENDECTOMY     COLON SURGERY     COLONOSCOPY W/ POLYPECTOMY  2001   COLONOSCOPY WITH PROPOFOL N/A 07/28/2016   Procedure: COLONOSCOPY WITH PROPOFOL;  Surgeon: Christena Deem, MD;  Location: [facility] ENDOSCOPY;  Service: Endoscopy;  Laterality: N/A;   COLONOSCOPY WITH PROPOFOL N/A 06/30/2022   Procedure: COLONOSCOPY WITH PROPOFOL;  Surgeon: Regis Bill, MD;  Location: [facility] ENDOSCOPY;  Service: Endoscopy;  Laterality: N/A;   CORONARY ANGIOPLASTY     CORONARY ARTERY BYPASS GRAFT N/A 05/07/2021   Procedure: CORONARY ARTERY BYPASS GRAFTING (CABG) X 4, USING LEFT INTERNAL MAMMARY ARTERY, LEFT  LEG GREATER SAPHENOUS VEIN HARVESTED ENDOSCOPICALLY;  Surgeon: Corliss Skains, MD;  Location: [facility] OR;  Service: Open Heart Surgery;  Laterality: N/A;   CORONARY STENT INTERVENTION N/A 04/15/2021   Procedure: CORONARY STENT INTERVENTION;  Surgeon: Iran Ouch, MD;  Location: [facility] INVASIVE CV LAB;  Service: Cardiovascular;  Laterality: N/A;   DILATION AND CURETTAGE OF UTERUS  1967   ENDOVEIN HARVEST OF GREATER SAPHENOUS VEIN Left 05/07/2021   Procedure: ENDOVEIN HARVEST OF GREATER SAPHENOUS VEIN;  Surgeon: Corliss Skains, MD;  Location: [facility] OR;  Service: Open Heart Surgery;  Laterality: Left;   IR THORACENTESIS ASP PLEURAL SPACE W/IMG GUIDE  05/10/2021   LEFT HEART CATH AND CORONARY ANGIOGRAPHY N/A 04/15/2021   Procedure: LEFT HEART CATH AND CORONARY ANGIOGRAPHY;  Surgeon: Iran Ouch, MD;  Location: [facility] INVASIVE CV LAB;  Service: Cardiovascular;  Laterality: N/A;   TEE WITHOUT CARDIOVERSION N/A 05/07/2021   Procedure: TRANSESOPHAGEAL ECHOCARDIOGRAM (TEE);  Surgeon: Corliss Skains, MD;  Location: [facility] OR;  Service: Open Heart Surgery;  Laterality: N/A;    SOCIAL HISTORY: Social History   Socioeconomic History   Marital status: Married    Spouse name: Eddie   Number of children: 1   Years of education: HS   Highest education level: Not on file  Occupational History    Employer: OTHER    Comment: [employer]  Tobacco Use   Smoking status: Former    Years: 10    Types: Cigarettes    Quit date: 1978    Years since quitting: 46.4   Smokeless tobacco: Never   Tobacco comments:    1 pack per week--only smoked on the weekends.   Vaping Use   Vaping Use: Never used  Substance and Sexual Activity   Alcohol use: No    Alcohol/week: 0.0 standard drinks of alcohol   Drug use: No   Sexual activity: Not on file  Other Topics Concern   Not on file  Social History Narrative   Not on file   Social Determinants of Health   Financial Resource Strain: Not on  file  Food Insecurity: Not on file  Transportation Needs: Not on file  Physical Activity: Not on file  Stress: Not on file  Social Connections: Not on file  Intimate Partner Violence: Not on file    FAMILY HISTORY: Family History  Problem Relation Age of Onset   Stroke Mother    Hypertension Mother  Cancer Father        Prostate    Hypertension Sister        x4   Heart attack Brother    Heart disease Brother        myocardial infarction   Hypertension Brother    Breast cancer Neg Hx     ALLERGIES:  is allergic to silicone, tape, lisinopril, and z-pak [azithromycin].  MEDICATIONS:  Current Outpatient Medications  Medication Sig Dispense Refill   acetaminophen (TYLENOL) 500 MG tablet Take 500 mg by mouth every 6 (six) hours as needed for moderate pain.     amLODipine (NORVASC) 10 MG tablet TAKE 1 TABLET BY MOUTH EVERY DAY 90 tablet 1   aspirin EC 325 MG EC tablet Take 1 tablet (325 mg total) by mouth daily.     Calcium Carbonate-Vitamin D (CALCIUM 600+D PO) Take 1 tablet by mouth daily.     Evolocumab (REPATHA SURECLICK) 140 MG/ML SOAJ Inject 140 mg into the skin every 14 (fourteen) days. 6 mL 3   metoprolol tartrate (LOPRESSOR) 25 MG tablet TAKE 1 TABLET BY MOUTH TWICE A DAY 180 tablet 3   NON FORMULARY Pt uses a cpap nightly     vitamin E 400 UNIT capsule Take 400 Units by mouth daily.      No current facility-administered medications for this visit.     PHYSICAL EXAMINATION: ECOG PERFORMANCE STATUS: 0 - Asymptomatic Vitals:   09/30/22 1323  BP: 136/65  Pulse: 72  Resp: 18  Temp: (!) 96.7 F (35.9 C)  SpO2: 99%   Filed Weights   09/30/22 1323  Weight: 182 lb 1.6 oz (82.6 kg)    Physical Exam Constitutional:      General: She is not in acute distress. HENT:     Head: Normocephalic and atraumatic.  Eyes:     General: No scleral icterus. Cardiovascular:     Rate and Rhythm: Normal rate and regular rhythm.     Heart sounds: Normal heart sounds.   Pulmonary:     Effort: Pulmonary effort is normal. No respiratory distress.     Breath sounds: No wheezing.  Abdominal:     General: Bowel sounds are normal. There is no distension.     Palpations: Abdomen is soft.  Musculoskeletal:        General: No deformity. Normal range of motion.     Cervical back: Normal range of motion and neck supple.  Skin:    General: Skin is warm and dry.     Findings: No erythema or rash.  Neurological:     Mental Status: She is alert and oriented to person, place, and time. Mental status is at baseline.     Cranial Nerves: No cranial nerve deficit.     Coordination: Coordination normal.  Psychiatric:        Mood and Affect: Mood normal.      LABORATORY DATA:  I have reviewed the data as listed Lab Results  Component Value Date   WBC 4.2 09/25/2022   HGB 16.1 (H) 09/25/2022   HCT 47.2 (H) 09/25/2022   MCV 87.9 09/25/2022   PLT 218 09/25/2022   Recent Labs    01/09/22 1408 01/21/22 0754 04/08/22 0732 08/28/22 0813 09/25/22 1347  NA 140 137  --  139 140  K 4.0 3.7  --  3.7 4.2  CL 106 102  --  103 105  CO2 28 29  --  27 26  GLUCOSE 103* 95  --  93  99  BUN 19 17  --  13 18  CREATININE 0.86 0.81  --  0.83 1.05*  CALCIUM 9.0 8.7  --  8.7 9.0  GFRNONAA >60  --   --   --  56*  PROT 6.9 7.0 6.9 6.6 7.2  ALBUMIN 4.0 4.0 3.9 4.0 4.3  AST 72* 33 27 27 55*  ALT 116* 67* 53* 44* 74*  ALKPHOS 165* 129* 102 93 123  BILITOT 0.5 0.6 1.2 0.8 0.9  BILIDIR  --  0.1 0.3* 0.2  --   IBILI  --   --  0.9  --   --

## 2022-09-30 NOTE — Assessment & Plan Note (Signed)
#  Secondary erythrocytosis secondary to sleep apnea Previous work up JAK2 V617F mutation negative, with reflex to other mutations CALR, MPL, JAK 2 Ex 12-15 mutations negative. BCR ABL mutation negative She is compliant with CPAP machine.  No need for phlebotomy at this point.  Continue observation.

## 2022-09-30 NOTE — Assessment & Plan Note (Signed)
#   liver lesions she has had extensive work up including No hypermetabolism on PET, Enhanced US showed likely benign hemangioma and biopsy was not proceeded. 10/24/2021 enhanced US showed right hepatic dome solid mass is smaller comparing to prior, similar contrast-enhancement characteristics.  Favor benign etiology.  Left hepatic mass appears similar.  Plan to repeat enhanced ultrasound

## 2022-10-06 ENCOUNTER — Ambulatory Visit
Admission: RE | Admit: 2022-10-06 | Discharge: 2022-10-06 | Disposition: A | Discharge status: Home / Self Care | Payer: BC Managed Care – PPO | Source: Ambulatory Visit | Attending: Oncology | Admitting: Oncology

## 2022-10-06 DIAGNOSIS — N838 Other noninflammatory disorders of ovary, fallopian tube and broad ligament: Secondary | ICD-10-CM | POA: Diagnosis present

## 2022-10-06 MED ORDER — GADOBUTROL 1 MMOL/ML IV SOLN
7.0000 mL | Freq: Once | INTRAVENOUS | Status: AC | PRN
  Administered 2022-10-06: 7 mL via INTRAVENOUS

## 2022-10-29 NOTE — Progress Notes (Signed)
Patient for US Liver w/contrast 1st lesion and [place] Abd Limited RUQ on Thurs 10/30/2022, I called and spoke with the patient on the phone and gave pre-procedure instructions. Pt was made aware to be here at 9a, NPO after MN prior to procedure as well as driver post procedure/recovery/discharge. Pt stated understanding.  Called 10/29/2022  - Ok for pt to NOT HOLD her ASA 325mg  due to this is not the actual liver biopsy.  If it is decided that she needs the liver biopsy, it will need to scheduled further out due to the ASA 325mg .

## 2022-10-30 ENCOUNTER — Other Ambulatory Visit: Payer: Self-pay | Admitting: Radiology

## 2022-10-30 ENCOUNTER — Ambulatory Visit
Admission: RE | Admit: 2022-10-30 | Discharge: 2022-10-30 | Disposition: A | Discharge status: Home / Self Care | Payer: BC Managed Care – PPO | Source: Ambulatory Visit | Attending: Oncology | Admitting: Oncology

## 2022-10-30 DIAGNOSIS — K769 Liver disease, unspecified: Secondary | ICD-10-CM | POA: Insufficient documentation

## 2022-10-30 MED ORDER — SULFUR HEXAFLUORIDE MICROSPH 60.7-25 MG IJ SUSR
5.0000 mL | Freq: Once | INTRAMUSCULAR | Status: AC | PRN
  Administered 2022-10-30: 2.4 mL via INTRAVENOUS

## 2022-12-18 ENCOUNTER — Encounter (INDEPENDENT_AMBULATORY_CARE_PROVIDER_SITE_OTHER): Payer: Self-pay

## 2023-01-06 ENCOUNTER — Encounter: Payer: Self-pay | Admitting: Pharmacist

## 2023-01-08 ENCOUNTER — Encounter: Payer: Self-pay | Admitting: Internal Medicine

## 2023-01-08 ENCOUNTER — Ambulatory Visit (INDEPENDENT_AMBULATORY_CARE_PROVIDER_SITE_OTHER): Payer: BC Managed Care – PPO | Admitting: Internal Medicine

## 2023-01-08 VITALS — BP 122/78 | HR 72 | Temp 98.0°F | Ht 63.0 in | Wt 182.6 lb

## 2023-01-08 DIAGNOSIS — I25119 Atherosclerotic heart disease of native coronary artery with unspecified angina pectoris: Secondary | ICD-10-CM | POA: Diagnosis not present

## 2023-01-08 DIAGNOSIS — G4730 Sleep apnea, unspecified: Secondary | ICD-10-CM

## 2023-01-08 DIAGNOSIS — I10 Essential (primary) hypertension: Secondary | ICD-10-CM

## 2023-01-08 DIAGNOSIS — R739 Hyperglycemia, unspecified: Secondary | ICD-10-CM

## 2023-01-08 DIAGNOSIS — Z23 Encounter for immunization: Secondary | ICD-10-CM | POA: Diagnosis not present

## 2023-01-08 DIAGNOSIS — R945 Abnormal results of liver function studies: Secondary | ICD-10-CM

## 2023-01-08 DIAGNOSIS — Z0000 Encounter for general adult medical examination without abnormal findings: Secondary | ICD-10-CM | POA: Diagnosis not present

## 2023-01-08 DIAGNOSIS — Z85038 Personal history of other malignant neoplasm of large intestine: Secondary | ICD-10-CM

## 2023-01-08 DIAGNOSIS — N838 Other noninflammatory disorders of ovary, fallopian tube and broad ligament: Secondary | ICD-10-CM

## 2023-01-08 DIAGNOSIS — D582 Other hemoglobinopathies: Secondary | ICD-10-CM

## 2023-01-08 DIAGNOSIS — E7800 Pure hypercholesterolemia, unspecified: Secondary | ICD-10-CM | POA: Diagnosis not present

## 2023-01-08 LAB — LIPID PANEL
Cholesterol: 149 mg/dL (ref 0–200)
HDL: 74.3 mg/dL (ref >39.00)
LDL Cholesterol: 61 mg/dL (ref 0–99)
NonHDL: 74.81
Total CHOL/HDL Ratio: 2
Triglycerides: 67 mg/dL (ref 0.0–149.0)
VLDL: 13.4 mg/dL (ref 0.0–40.0)

## 2023-01-08 LAB — TSH: TSH: 0.89 u[IU]/mL (ref 0.35–5.50)

## 2023-01-08 LAB — HEPATIC FUNCTION PANEL
ALT: 52 U/L — ABNORMAL HIGH (ref 0–35)
AST: 25 U/L (ref 0–37)
Albumin: 4.2 g/dL (ref 3.5–5.2)
Alkaline Phosphatase: 103 U/L (ref 39–117)
Bilirubin, Direct: 0.2 mg/dL (ref 0.0–0.3)
Total Bilirubin: 0.8 mg/dL (ref 0.2–1.2)
Total Protein: 7.3 g/dL (ref 6.0–8.3)

## 2023-01-08 LAB — BASIC METABOLIC PANEL
BUN: 22 mg/dL (ref 6–23)
CO2: 28 meq/L (ref 19–32)
Calcium: 9.7 mg/dL (ref 8.4–10.5)
Chloride: 105 meq/L (ref 96–112)
Creatinine, Ser: 0.99 mg/dL (ref 0.40–1.20)
GFR: 56.71 mL/min — ABNORMAL LOW (ref >60.00)
Glucose, Bld: 92 mg/dL (ref 70–99)
Potassium: 3.8 meq/L (ref 3.5–5.1)
Sodium: 140 meq/L (ref 135–145)

## 2023-01-08 LAB — HEMOGLOBIN A1C: Hgb A1c MFr Bld: 5.9 % (ref 4.6–6.5)

## 2023-01-08 NOTE — Assessment & Plan Note (Addendum)
Physical today 01/08/23.  Mammogram 12/20/20 - Birads I.  Colonoscopy 10/2019 - recommended f/u in 3 years.  Follow up colonoscopy 06/2022 - ok.  Recommended f/u in 5 years.

## 2023-01-08 NOTE — Assessment & Plan Note (Signed)
Low carb diet and exercise.  Follow met b and a1c.   

## 2023-01-08 NOTE — Assessment & Plan Note (Signed)
Continue cpap.  

## 2023-01-08 NOTE — Assessment & Plan Note (Signed)
Hematology -  follows for ovarian mass.  Had f/u MRI 10/06/22 - Intrinsically T2 hypointense mass of the right ovary with ow-level homogeneous internal contrast enhancement is unchanged, measuring 3.2 x 2.3 cm. 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

## 2023-01-08 NOTE — Assessment & Plan Note (Signed)
Off statin due to elevated liver function tests.  Being evaluated by oncology.  Dr Cathie Hoops - ordered f/u abdominal ultrasound - liver mass unchanged/smaller - favored benign lesion.  F/u Dr Cathie Hoops - f/u enhanced ultrasound recommended one year.  F/u ultrasound - Continued slight decreased size of previously visualized right hepatic dome hyperechoic mass with contrast enhancement characteristics which again favored benign etiology such as hemangioma. Previously visualized left lobe hepatic mass is no longer conspicuous. Follow liver function tests.

## 2023-01-08 NOTE — Assessment & Plan Note (Signed)
Followed by hematology as outlined.  Continue cpap.  

## 2023-01-08 NOTE — Assessment & Plan Note (Addendum)
Had swelling on lisinopril.  On amlodipine. Blood pressure as outlined..  recheck improved. Follow pressures.  Follow metabolic panel.

## 2023-01-08 NOTE — Assessment & Plan Note (Signed)
On Repatha.  Low cholesterol diet and exercise.  Follow lipid panel and liver function tests.   Lab Results  Component Value Date   CHOL 136 08/28/2022   HDL 67.90 08/28/2022   LDLCALC 51 08/28/2022   LDLDIRECT 53.0 07/18/2021   TRIG 85.0 08/28/2022   CHOLHDL 2 08/28/2022

## 2023-01-08 NOTE — Assessment & Plan Note (Signed)
Recently evaluated by GI - f/u elevated liver enzymes and recommended f/u colonoscopy.  Colonoscopy 06/2022 - unremarkable.  Recommended f/u in 5 years. Saw Dr Cathie Hoops 01/2022 - ordered CT chest/abd/pelvis.  Has f/u 09/2022.

## 2023-01-08 NOTE — Assessment & Plan Note (Signed)
Doing well on amlodipine, metoprolol.  Off statin due to liver enzyme elevation. No pain.  On repatha. Follow.

## 2023-01-08 NOTE — Progress Notes (Signed)
Subjective:    [name]: Mamie Nick, female    DOB: 07-14-1950, 72 y.o.   MRN: [id number]  Patient here for  Chief Complaint  Patient presents with   Annual Exam    HPI Here for a physical exam.  Seeing Dr Cathie Hoops - f/u liver lesion.  10/24/2021 enhanced US showed right hepatic dome solid mass is smaller comparing to prior, similar contrast-enhancement characteristics. Favor benign etiology. Left hepatic mass appears similar. F/u ultrasound - Continued slight decreased size of previously visualized right hepatic dome hyperechoic mass with contrast enhancement characteristics which again favored benign etiology such as hemangioma. Previously visualized left lobe hepatic mass is no longer conspicuous.  Also follows for ovarian mass.  Had f/u MRI 10/06/22 - Intrinsically T2 hypointense mass of the right ovary with ow-level homogeneous internal contrast enhancement is unchanged, measuring 3.2 x 2.3 cm. 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 No evidence of lymphadenopathy or metastatic disease in the pelvis. Descending and sigmoid diverticulosis. Is also followed for history of colon cancer.  CT 02/2022 - no evidence of cancer recurrence.  Is s/p CABG 05/2021.  Brief post op afib.  Evaluated by cardiology 08/2022 - stable. Still some chest soreness with palpation, but overall doing well.  Exercising.  No chest pain or sob with increased activity or exertion.  Wants to hold on mammogram. Compliant with cpap.    Past Medical History:  Diagnosis Date   Abnormal liver function    Colon cancer (HCC)    adenocarcinoma of the cecum s/p partial colon resection   Coronary artery disease    a. 03/2021 Cor CTA: sev LAD dzs; b. 04/2021 Cath: LM nl, LAD 12m (unable to wire), D1 60, LCX nl, OM3 60, LPAV 100, RCA 90p; c. 05/2021 CABG x 4: LIMA->LAD, VG->RPDA, VG->OM1, VG->OM3.   History of colon polyps    Hyperlipidemia    Hypertension    Post-operative Atrial Fibrillation  (HCC)    a. 05/2021 Brief PAF after CABG-->amio.   Sleep apnea 12/28/2017   Systolic murmur    a. 04/2021 Echo: EF 60-65%, no rwma, GrI DD, RVSP 28.30mmHg. Mild-mod MR.   Past Surgical History:  Procedure Laterality Date   ABDOMINAL HYSTERECTOMY  1984   secondary to bleeding and fibroid tumors   APPENDECTOMY     COLON SURGERY     COLONOSCOPY W/ POLYPECTOMY  2001   COLONOSCOPY WITH PROPOFOL N/A 07/28/2016   Procedure: COLONOSCOPY WITH PROPOFOL;  Surgeon: Christena Deem, MD;  Location: [facility] ENDOSCOPY;  Service: Endoscopy;  Laterality: N/A;   COLONOSCOPY WITH PROPOFOL N/A 06/30/2022   Procedure: COLONOSCOPY WITH PROPOFOL;  Surgeon: Regis Bill, MD;  Location: [facility] ENDOSCOPY;  Service: Endoscopy;  Laterality: N/A;   CORONARY ANGIOPLASTY     CORONARY ARTERY BYPASS GRAFT N/A 05/07/2021   Procedure: CORONARY ARTERY BYPASS GRAFTING (CABG) X 4, USING LEFT INTERNAL MAMMARY ARTERY, LEFT LEG GREATER SAPHENOUS VEIN HARVESTED ENDOSCOPICALLY;  Surgeon: Corliss Skains, MD;  Location: [facility] OR;  Service: Open Heart Surgery;  Laterality: N/A;   CORONARY STENT INTERVENTION N/A 04/15/2021   Procedure: CORONARY STENT INTERVENTION;  Surgeon: Iran Ouch, MD;  Location: [facility] INVASIVE CV LAB;  Service: Cardiovascular;  Laterality: N/A;   DILATION AND CURETTAGE OF UTERUS  1967   ENDOVEIN HARVEST OF GREATER SAPHENOUS VEIN Left 05/07/2021   Procedure: ENDOVEIN HARVEST OF GREATER SAPHENOUS VEIN;  Surgeon: Corliss Skains, MD;  Location: [facility] OR;  Service: Open Heart Surgery;  Laterality: Left;   IR THORACENTESIS ASP PLEURAL SPACE W/IMG GUIDE  05/10/2021   LEFT HEART CATH AND CORONARY ANGIOGRAPHY N/A 04/15/2021   Procedure: LEFT HEART CATH AND CORONARY ANGIOGRAPHY;  Surgeon: Iran Ouch, MD;  Location: [facility] INVASIVE CV LAB;  Service: Cardiovascular;  Laterality: N/A;   TEE WITHOUT CARDIOVERSION N/A 05/07/2021   Procedure: TRANSESOPHAGEAL ECHOCARDIOGRAM (TEE);  Surgeon: Corliss Skains,  MD;  Location: [facility] OR;  Service: Open Heart Surgery;  Laterality: N/A;   Family History  Problem Relation Age of Onset   Stroke Mother    Hypertension Mother    Cancer Father        Prostate    Hypertension Sister        x4   Heart attack Brother    Heart disease Brother        myocardial infarction   Hypertension Brother    Breast cancer Neg Hx    Social History   Socioeconomic History   Marital status: Married    Spouse name: Eddie   Number of children: 1   Years of education: HS   Highest education level: Not on file  Occupational History    Employer: OTHER    Comment: [employer]  Tobacco Use   Smoking status: Former    Current packs/day: 0.00    Types: Cigarettes    Start date: 1968    Quit date: 1978    Years since quitting: 46.7   Smokeless tobacco: Never   Tobacco comments:    1 pack per week--only smoked on the weekends.   Vaping Use   Vaping status: Never Used  Substance and Sexual Activity   Alcohol use: No    Alcohol/week: 0.0 standard drinks of alcohol   Drug use: No   Sexual activity: Not on file  Other Topics Concern   Not on file  Social History Narrative   Not on file   Social Determinants of Health   Financial Resource Strain: Not on file  Food Insecurity: Not on file  Transportation Needs: Not on file  Physical Activity: Not on file  Stress: Not on file  Social Connections: Not on file     Review of Systems  Constitutional:  Negative for appetite change and unexpected weight change.  HENT:  Negative for congestion, sinus pressure and sore throat.   Eyes:  Negative for pain and visual disturbance.  Respiratory:  Negative for cough, chest tightness and shortness of breath.   Cardiovascular:  Negative for chest pain and palpitations.       No increased swelling.   Gastrointestinal:  Negative for abdominal pain, diarrhea, nausea and vomiting.  Genitourinary:  Negative for difficulty urinating and dysuria.  Musculoskeletal:   Negative for joint swelling and myalgias.  Skin:  Negative for color change and rash.  Neurological:  Negative for dizziness and headaches.  Hematological:  Negative for adenopathy. Does not bruise/bleed easily.  Psychiatric/Behavioral:  Negative for agitation and dysphoric mood.        Objective:     BP 122/78   Pulse 72   Temp 98 F (36.7 C) (Oral)   Ht 5\' 3"  (1.6 m)   Wt 182 lb 9.6 oz (82.8 kg)   LMP 06/28/1982   SpO2 99%   BMI 32.35 kg/m  Wt Readings from Last 3 Encounters:  01/08/23 182 lb 9.6 oz (82.8 kg)  10/30/22 181 lb (82.1 kg)  09/30/22 182 lb 1.6 oz (82.6 kg)    Physical Exam Vitals reviewed.  Constitutional:      General: She is not in acute distress.    Appearance: Normal appearance. She is well-developed.  HENT:     Head: Normocephalic and atraumatic.     Right Ear: External ear normal.     Left Ear: External ear normal.  Eyes:     General: No scleral icterus.       Right eye: No discharge.        Left eye: No discharge.     Conjunctiva/sclera: Conjunctivae normal.  Neck:     Thyroid: No thyromegaly.  Cardiovascular:     Rate and Rhythm: Normal rate and regular rhythm.     Comments: Minimal soreness - adjacent to sternal scar.   Pulmonary:     Effort: No tachypnea, accessory muscle usage or respiratory distress.     Breath sounds: Normal breath sounds. No decreased breath sounds or wheezing.  Chest:  Breasts:    Right: No inverted nipple, mass, nipple discharge or tenderness (no axillary adenopathy).     Left: No inverted nipple, mass, nipple discharge or tenderness (no axilarry adenopathy).  Abdominal:     General: Bowel sounds are normal.     Palpations: Abdomen is soft.     Tenderness: There is no abdominal tenderness.  Musculoskeletal:        General: No swelling or tenderness.     Cervical back: Neck supple.  Lymphadenopathy:     Cervical: No cervical adenopathy.  Skin:    Findings: No erythema or rash.  Neurological:     Mental  Status: She is alert and oriented to person, place, and time.  Psychiatric:        Mood and Affect: Mood normal.        Behavior: Behavior normal.      Outpatient Encounter Medications as of 01/08/2023  Medication Sig   acetaminophen (TYLENOL) 500 MG tablet Take 500 mg by mouth every 6 (six) hours as needed for moderate pain.   amLODipine (NORVASC) 10 MG tablet TAKE 1 TABLET BY MOUTH EVERY DAY   aspirin EC 325 MG EC tablet Take 1 tablet (325 mg total) by mouth daily.   Calcium Carbonate-Vitamin D (CALCIUM 600+D PO) Take 1 tablet by mouth daily.   Evolocumab (REPATHA SURECLICK) 140 MG/ML SOAJ Inject 140 mg into the skin every 14 (fourteen) days.   metoprolol tartrate (LOPRESSOR) 25 MG tablet TAKE 1 TABLET BY MOUTH TWICE A DAY   NON FORMULARY Pt uses a cpap nightly   vitamin E 400 UNIT capsule Take 400 Units by mouth daily.    No facility-administered encounter medications on file as of 01/08/2023.     Lab Results  Component Value Date   WBC 4.2 09/25/2022   HGB 16.1 (H) 09/25/2022   HCT 47.2 (H) 09/25/2022   PLT 218 09/25/2022   GLUCOSE 99 09/25/2022   CHOL 136 08/28/2022   TRIG 85.0 08/28/2022   HDL 67.90 08/28/2022   LDLDIRECT 53.0 07/18/2021   LDLCALC 51 08/28/2022   ALT 74 (H) 09/25/2022   AST 55 (H) 09/25/2022   NA 140 09/25/2022   K 4.2 09/25/2022   CL 105 09/25/2022   CREATININE 1.05 (H) 09/25/2022   BUN 18 09/25/2022   CO2 26 09/25/2022   TSH 1.18 09/24/2021   INR 1.0 11/15/2021   HGBA1C 5.7 08/28/2022    US LIVER W/CM 1ST LESION  Result Date: 10/30/2022 CLINICAL DATA:  72 year old female with remote history of colon cancer status post right hemicolectomy in 2011.  Prior diagnostic dilemma with MRI nuclear medicine studies for hepatic dome and left hepatic masses which have been followed by contrast enhanced liver ultrasound performed on 05/02/2020, 12/14/2020, and 10/24/2021, all which have suggested hemangioma. She presents today for routine annual follow-up.  EXAM: US LIVER WITH CONTRAST CONTRAST:  2.4 mL sulfur hexafluoride microspheres (Lumason), intravenous Number of injections: 1 COMPARISON:  05/02/2020, 12/14/2020, and 10/24/2021 FINDINGS: LIVER Size: Normal. Echogenicity: Within normal limits. Surface Contour: Smooth. Observation 1 Lobe: Right Size: 2.3 x 2.3 x 2.1 cm, previously 2.4 x 2.3 x 2.2, initially 3.9 x 2.4 x 2.9 Arterial phase enhancement features: Diffuse, immediate Onset of washout: Less than 30 seconds Degree of washout: Mild Tumor in vein: Not applicable BILIARY SYSTEM Gallbladder: Present and unremarkable. Intrahepatic bile ducts:  Nondilated. Common bile duct: Within normal limits. FLUID No ascites. VASCULATURE Main portal vein: Patent with antegrade flow. Right portal vein: Patent with antegrade flow. Left portal vein: Patent with antegrade flow. Hepatic veins: Patent. IMPRESSION: 1. Continued slight decreased size of previously visualized right hepatic dome hyperechoic mass with contrast enhancement characteristics which again favored benign etiology such as hemangioma. 2. Previously visualized left lobe hepatic mass is no longer conspicuous. Marliss Coots, MD [facility] Radiology Electronically Signed   By: Marliss Coots M.D.   On: 10/30/2022 11:30   US ABDOMEN LIMITED RUQ (LIVER/GB)  Result Date: 10/30/2022 CLINICAL DATA:  72 year old female with remote history of colon cancer status post right hemicolectomy in 2011. Prior diagnostic dilemma with MRI nuclear medicine studies for hepatic dome and left hepatic masses which have been followed by contrast enhanced liver ultrasound performed on 05/02/2020, 12/14/2020, and 10/24/2021, all which have suggested hemangioma. She presents today for routine annual follow-up. EXAM: US LIVER WITH CONTRAST CONTRAST:  2.4 mL sulfur hexafluoride microspheres (Lumason), intravenous Number of injections: 1 COMPARISON:  05/02/2020, 12/14/2020, and 10/24/2021  FINDINGS: LIVER Size: Normal. Echogenicity: Within normal limits. Surface Contour: Smooth. Observation 1 Lobe: Right Size: 2.3 x 2.3 x 2.1 cm, previously 2.4 x 2.3 x 2.2, initially 3.9 x 2.4 x 2.9 Arterial phase enhancement features: Diffuse, immediate Onset of washout: Less than 30 seconds Degree of washout: Mild Tumor in vein: Not applicable BILIARY SYSTEM Gallbladder: Present and unremarkable. Intrahepatic bile ducts:  Nondilated. Common bile duct: Within normal limits. FLUID No ascites. VASCULATURE Main portal vein: Patent with antegrade flow. Right portal vein: Patent with antegrade flow. Left portal vein: Patent with antegrade flow. Hepatic veins: Patent. IMPRESSION: 1. Continued slight decreased size of previously visualized right hepatic dome hyperechoic mass with contrast enhancement characteristics which again favored benign etiology such as hemangioma. 2. Previously visualized left lobe hepatic mass is no longer conspicuous. Marliss Coots, MD [facility] Radiology Electronically Signed   By: Marliss Coots M.D.   On: 10/30/2022 11:30       Assessment & Plan:  Hyperglycemia Assessment & Plan: Low carb diet and exercise.  Follow met b and a1c.   Orders: -     Hemoglobin A1c  Pure hypercholesterolemia Assessment & Plan: On Repatha.  Low cholesterol diet and exercise.  Follow lipid panel and liver function tests.   Lab Results  Component Value Date   CHOL 136 08/28/2022   HDL 67.90 08/28/2022   LDLCALC 51 08/28/2022   LDLDIRECT 53.0 07/18/2021   TRIG 85.0 08/28/2022   CHOLHDL 2 08/28/2022    Orders: -     Lipid panel -     TSH -  Basic metabolic panel -     Hepatic function panel  Health care maintenance Assessment & Plan: Physical today 01/08/23.  Mammogram 12/20/20 - Birads I.  Colonoscopy 10/2019 - recommended f/u in 3 years.  Follow up colonoscopy 06/2022 - ok.  Recommended f/u in 5 years.    Abnormal liver  function Assessment & Plan: Off statin due to elevated liver function tests.  Being evaluated by oncology.  Dr Cathie Hoops - ordered f/u abdominal ultrasound - liver mass unchanged/smaller - favored benign lesion.  F/u Dr Cathie Hoops - f/u enhanced ultrasound recommended one year.  F/u ultrasound - Continued slight decreased size of previously visualized right hepatic dome hyperechoic mass with contrast enhancement characteristics which again favored benign etiology such as hemangioma. Previously visualized left lobe hepatic mass is no longer conspicuous. Follow liver function tests.    Coronary artery disease involving native coronary artery of native heart with angina pectoris [facility]) Assessment & Plan: Doing well on amlodipine, metoprolol.  Off statin due to liver enzyme elevation. No pain.  On repatha. Follow.    Elevated hemoglobin (HCC) Assessment & Plan: Followed by hematology as outlined.  Continue cpap.    Essential hypertension, benign Assessment & Plan: Had swelling on lisinopril.  On amlodipine. Blood pressure as outlined..  recheck improved. Follow pressures.  Follow metabolic panel.    History of colon cancer Assessment & Plan: Recently evaluated by GI - f/u elevated liver enzymes and recommended f/u colonoscopy.  Colonoscopy 06/2022 - unremarkable.  Recommended f/u in 5 years. Saw Dr Cathie Hoops 01/2022 - ordered CT chest/abd/pelvis.  Has f/u 09/2022.     Ovarian mass Assessment & Plan: Hematology -  follows for ovarian mass.  Had f/u MRI 10/06/22 - Intrinsically T2 hypointense mass of the right ovary with ow-level homogeneous internal contrast enhancement is unchanged, measuring 3.2 x 2.3 cm. 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    Sleep apnea, unspecified type Assessment & Plan: Continue cpap.      Dale Bakerhill, MD

## 2023-03-03 ENCOUNTER — Other Ambulatory Visit: Payer: Self-pay | Admitting: Internal Medicine

## 2023-03-03 DIAGNOSIS — Z1231 Encounter for screening mammogram for malignant neoplasm of breast: Secondary | ICD-10-CM

## 2023-03-18 ENCOUNTER — Ambulatory Visit
Admission: RE | Admit: 2023-03-18 | Discharge: 2023-03-18 | Disposition: A | Discharge status: Home / Self Care | Payer: BC Managed Care – PPO | Source: Ambulatory Visit | Attending: Internal Medicine | Admitting: Internal Medicine

## 2023-03-18 DIAGNOSIS — Z1231 Encounter for screening mammogram for malignant neoplasm of breast: Secondary | ICD-10-CM | POA: Diagnosis present

## 2023-03-19 ENCOUNTER — Other Ambulatory Visit: Payer: Self-pay | Admitting: Cardiovascular Disease

## 2023-03-19 NOTE — Telephone Encounter (Signed)
Hi Sabrina,  Will you please outreach patient to schedule past due 6 month follow up?  Thank you, Ferne Coe

## 2023-03-20 NOTE — Telephone Encounter (Signed)
Last office visit 08/29/22 with plan to f/u in 6 months  next office visit:  none/active recall  Please schedule f/u appt.  Thanks!

## 2023-03-24 ENCOUNTER — Other Ambulatory Visit: Payer: Self-pay | Admitting: Cardiovascular Disease

## 2023-03-24 NOTE — Telephone Encounter (Signed)
Please contact pt for future appointment. Pt due for 6 month f/u. 

## 2023-03-26 NOTE — Telephone Encounter (Signed)
Last office visit 08/29/22 with plan to f/u in 6 months next office visit: 03/31/23

## 2023-03-29 NOTE — Progress Notes (Unsigned)
Cardiology Clinic Note   Date: 03/31/2023 ID: Susan Weeks, DOB November 26, 1950, MRN [id number]  Primary Cardiologist:  Lorine Bears, MD  Patient Profile    Susan Weeks is a 72 y.o. female who presents to the clinic today for routine follow up.    Past medical history significant for: CAD. Coronary CTA with FFR 04/01/2021: Calcium score 52.4 (61st percentile).  Calcified and noncalcified plaque causing severe mid LAD stenosis.  FFR showed significant stenosis (CTO) in mid LAD, significant stenosis in mid RCA. LHC 04/15/2021: Mid LAD 100%.  D1 60%.  OM 360%.  LPAV 100%.  Proximal RCA 90%.  Unsuccessful angioplasty to LAD secondary to unable to cross lesion.  Recommend CTS consult. Echo 04/19/2021: EF 60 to 65%.  No RWMA.  Grade I DD.  Normal RV size/function.  Normal PA pressure.  Mild to moderate MR.  Aortic valve sclerosis without stenosis. CABG x 4 05/07/2021: LIMA to LAD; reverse SVG to PDA, OM1, OM 3. PAF.  Onset postop CABG January 2023. Hypertension. Hyperlipidemia. Lipid panel 01/08/2023: LDL 61, HDL 74, TG 67, total 149. OSA. CPAP 100% adherence.  Colon cancer.  In summary, patient was first evaluated by Dr. Kirke Corin on 03/19/2021 for exertional dyspnea, lower extremity edema, and chest fullness.  Coronary CTA showed severe LAD disease.  Echo showed normal LV/RV function, Grade I DD, mild to moderate MR.  She underwent LHC which showed occluded LAD and severe RCA disease.  Plan for PCI of the LAD and staged PCI to RCA at a later date however LAD lesion could not be crossed therefore patient was referred to CTS for consult.  She underwent CABG x 4 in January 2023.  Postop course complicated by brief episode of A-fib managed with oral amiodarone.  She had a large left pleural effusion in February 2023 requiring Lasix x 7 days.  She was initially going to have thoracentesis however ultrasound showed only trace left pleural effusion so procedure was not performed.  Statin therapy was  discontinued in March 2023 secondary to elevated LFTs.  Liver lesions seen on prior imaging were extensively worked up and found to be benign.     History of Present Illness    Susan Weeks is followed by Dr. Kirke Corin for the above outlined history.   Patient was last seen in the office by Dr. Kirke Corin on 08/29/2022 for routine follow-up.  She was doing well at that time and no medication changes were made.  Today, patient is here alone. She is doing well with no complaints. Patient denies shortness of breath, dyspnea on exertion, orthopnea or PND. No chest pain, pressure, or tightness. No palpitations. She reports occasional dependent edema when she sits for a long period of time. She wears compression socks when she knows she is going to be sitting for long periods of time. She is very active walking for an hour five days a week.         ROS: All other systems reviewed and are otherwise negative except as noted in History of Present Illness.  Studies Reviewed    EKG Interpretation Date/Time:  Tuesday March 31 2023 10:48:02 EST Ventricular Rate:  67 PR Interval:  212 QRS Duration:  86 QT Interval:  390 QTC Calculation: 412 R Axis:   -14  Text Interpretation: Sinus rhythm with 1st degree A-V block Anterolateral infarct (cited on or before 08-May-2021) When compared with ECG of 08/29/2022 no significant changes Confirmed by Carlos Levering [id number]) on 03/31/2023 11:04:44 AM  Physical Exam    VS:  BP 126/80 (BP Location: Left Arm, Patient Position: Sitting, Cuff Size: Normal)   Pulse 67   Ht 5\' 3"  (1.6 m)   Wt 181 lb 12.8 oz (82.5 kg)   LMP 06/28/1982   SpO2 98%   BMI 32.20 kg/m  , BMI Body mass index is 32.2 kg/m.  GEN: Well nourished, well developed, in no acute distress. Neck: No JVD or carotid bruits. Cardiac:  RRR. No murmurs. No rubs or gallops.   Respiratory:  Respirations regular and unlabored. Clear to auscultation without rales, wheezing or rhonchi. GI:  Soft, nontender, nondistended. Extremities: Radials/DP/PT 2+ and equal bilaterally. No clubbing or cyanosis. Mild, non-pitting bilateral lower extremity edema.   Skin: Warm and dry, no rash. Neuro: Strength intact.  Assessment & Plan   CAD S/p CABG x 08 May 2021.  Patient denies chest pain, pressure or tightness. She is active walking 1 hour five days a week. EKG shows NSR with 1st degree AV block.  -Continue amlodipine, metoprolol, aspirin, Repatha.  PAF Onset postop CABG January 2023. No recurrence. Patient denies palpitations.  EKG today shows NSR.  -Continue to monitor.   Hypertension BP today 126/80. -Continue amlodipine, metoprolol.  Hyperlipidemia LDL September 2024 61, at goal.  Patient not on statin secondary to history of elevated liver enzymes.  Labs from November 2024 show AST 45, ALT 54, Alk Phos 108.  -Continue Repatha.     Disposition: Return in 6 months or sooner as needed.          Signed, Etta Grandchild. Birtie Fellman, DNP, NP-C

## 2023-03-30 ENCOUNTER — Inpatient Hospital Stay: Payer: BC Managed Care – PPO | Attending: Oncology

## 2023-03-30 DIAGNOSIS — K7689 Other specified diseases of liver: Secondary | ICD-10-CM | POA: Diagnosis not present

## 2023-03-30 DIAGNOSIS — G4730 Sleep apnea, unspecified: Secondary | ICD-10-CM | POA: Diagnosis present

## 2023-03-30 DIAGNOSIS — N839 Noninflammatory disorder of ovary, fallopian tube and broad ligament, unspecified: Secondary | ICD-10-CM | POA: Insufficient documentation

## 2023-03-30 DIAGNOSIS — D751 Secondary polycythemia: Secondary | ICD-10-CM | POA: Diagnosis present

## 2023-03-30 DIAGNOSIS — Z85038 Personal history of other malignant neoplasm of large intestine: Secondary | ICD-10-CM | POA: Insufficient documentation

## 2023-03-30 LAB — CBC WITH DIFFERENTIAL (CANCER CENTER ONLY)
Abs Immature Granulocytes: 0.01 10*3/uL (ref 0.00–0.07)
Basophils Absolute: 0 10*3/uL (ref 0.0–0.1)
Basophils Relative: 1 %
Eosinophils Absolute: 0.1 10*3/uL (ref 0.0–0.5)
Eosinophils Relative: 2 %
HCT: 46.1 % — ABNORMAL HIGH (ref 36.0–46.0)
Hemoglobin: 16.1 g/dL — ABNORMAL HIGH (ref 12.0–15.0)
Immature Granulocytes: 0 %
Lymphocytes Relative: 36 %
Lymphs Abs: 1.7 10*3/uL (ref 0.7–4.0)
MCH: 30.6 pg (ref 26.0–34.0)
MCHC: 34.9 g/dL (ref 30.0–36.0)
MCV: 87.6 fL (ref 80.0–100.0)
Monocytes Absolute: 0.4 10*3/uL (ref 0.1–1.0)
Monocytes Relative: 8 %
Neutro Abs: 2.5 10*3/uL (ref 1.7–7.7)
Neutrophils Relative %: 53 %
Platelet Count: 238 10*3/uL (ref 150–400)
RBC: 5.26 MIL/uL — ABNORMAL HIGH (ref 3.87–5.11)
RDW: 12.6 % (ref 11.5–15.5)
WBC Count: 4.6 10*3/uL (ref 4.0–10.5)
nRBC: 0 % (ref 0.0–0.2)

## 2023-03-30 LAB — CMP (CANCER CENTER ONLY)
ALT: 54 U/L — ABNORMAL HIGH (ref 0–44)
AST: 46 U/L — ABNORMAL HIGH (ref 15–41)
Albumin: 4.2 g/dL (ref 3.5–5.0)
Alkaline Phosphatase: 108 U/L (ref 38–126)
Anion gap: 12 (ref 5–15)
BUN: 20 mg/dL (ref 8–23)
CO2: 25 mmol/L (ref 22–32)
Calcium: 9.4 mg/dL (ref 8.9–10.3)
Chloride: 103 mmol/L (ref 98–111)
Creatinine: 1.14 mg/dL — ABNORMAL HIGH (ref 0.44–1.00)
GFR, Estimated: 51 mL/min — ABNORMAL LOW (ref >60)
Glucose, Bld: 98 mg/dL (ref 70–99)
Potassium: 4 mmol/L (ref 3.5–5.1)
Sodium: 140 mmol/L (ref 135–145)
Total Bilirubin: 0.7 mg/dL (ref <1.2)
Total Protein: 7.2 g/dL (ref 6.5–8.1)

## 2023-03-31 ENCOUNTER — Encounter: Payer: Self-pay | Admitting: Student

## 2023-03-31 ENCOUNTER — Ambulatory Visit: Payer: BC Managed Care – PPO | Attending: Student | Admitting: Student

## 2023-03-31 VITALS — BP 126/80 | HR 67 | Ht 63.0 in | Wt 181.8 lb

## 2023-03-31 DIAGNOSIS — I480 Paroxysmal atrial fibrillation: Secondary | ICD-10-CM

## 2023-03-31 DIAGNOSIS — I25810 Atherosclerosis of coronary artery bypass graft(s) without angina pectoris: Secondary | ICD-10-CM

## 2023-03-31 DIAGNOSIS — I10 Essential (primary) hypertension: Secondary | ICD-10-CM

## 2023-03-31 DIAGNOSIS — E785 Hyperlipidemia, unspecified: Secondary | ICD-10-CM

## 2023-03-31 LAB — CEA: CEA: 5.9 ng/mL — ABNORMAL HIGH (ref 0.0–4.7)

## 2023-03-31 NOTE — Patient Instructions (Signed)
Medication Instructions:  - Your physician recommends that you continue on your current medications as directed. Please refer to the Current Medication list given to you today.  *If you need a refill on your cardiac medications before your next appointment, please call your pharmacy*   Lab Work: - none ordered  If you have labs (blood work) drawn today and your tests are completely normal, you will receive your results only by: MyChart Message (if you have MyChart) OR A paper copy in the mail If you have any lab test that is abnormal or we need to change your treatment, we will call you to review the results.   Testing/Procedures: - none ordered   Follow-Up: At [facility], you and your health needs are our priority.  As part of our continuing mission to provide you with exceptional heart care, we have created designated Provider Care Teams.  These Care Teams include your primary Cardiologist (physician) and Advanced Practice Providers (APPs -  Physician Assistants and Nurse Practitioners) who all work together to provide you with the care you need, when you need it.  We recommend signing up for the patient portal called "MyChart".  Sign up information is provided on this After Visit Summary.  MyChart is used to connect with patients for Virtual Visits (Telemedicine).  Patients are able to view lab/test results, encounter notes, upcoming appointments, etc.  Non-urgent messages can be sent to your provider as well.   To learn more about what you can do with MyChart, go to [email or web address].    Your next appointment:   6 month(s)  Provider:   You may see Lorine Bears, MD or one of the following Advanced Practice Providers on your designated Care Team:    Carlos Levering, NP    Other Instructions N/a

## 2023-04-06 ENCOUNTER — Other Ambulatory Visit: Payer: Self-pay

## 2023-04-06 DIAGNOSIS — Z85038 Personal history of other malignant neoplasm of large intestine: Secondary | ICD-10-CM

## 2023-04-07 ENCOUNTER — Encounter: Payer: Self-pay | Admitting: Oncology

## 2023-04-07 ENCOUNTER — Inpatient Hospital Stay: Payer: BC Managed Care – PPO | Attending: Oncology | Admitting: Oncology

## 2023-04-07 VITALS — BP 133/63 | HR 67 | Temp 96.7°F | Resp 18 | Wt 185.0 lb

## 2023-04-07 DIAGNOSIS — D751 Secondary polycythemia: Secondary | ICD-10-CM | POA: Diagnosis present

## 2023-04-07 DIAGNOSIS — N838 Other noninflammatory disorders of ovary, fallopian tube and broad ligament: Secondary | ICD-10-CM | POA: Diagnosis not present

## 2023-04-07 DIAGNOSIS — Z85038 Personal history of other malignant neoplasm of large intestine: Secondary | ICD-10-CM | POA: Insufficient documentation

## 2023-04-07 DIAGNOSIS — K769 Liver disease, unspecified: Secondary | ICD-10-CM | POA: Diagnosis not present

## 2023-04-07 DIAGNOSIS — G4730 Sleep apnea, unspecified: Secondary | ICD-10-CM | POA: Diagnosis present

## 2023-04-07 DIAGNOSIS — Z87891 Personal history of nicotine dependence: Secondary | ICD-10-CM | POA: Diagnosis not present

## 2023-04-07 NOTE — Assessment & Plan Note (Addendum)
#   liver lesions she has had extensive work up including No hypermetabolism on PET, Enhanced US showed likely benign hemangioma and biopsy was not proceeded. 10/30/2022 contrast enhanced US showed  1. Continued slight decreased size of previously visualized right hepatic dome hyperechoic mass with contrast enhancement characteristics which again favored benign etiology such as hemangioma. 2. Previously visualized left lobe hepatic mass is no longer conspicuous  Plan to repeat enhanced ultrasound in 6 months - 1 year interval

## 2023-04-07 NOTE — Assessment & Plan Note (Signed)
No activity on previous PET scan.  MRI pelvis findings are most consistent with a benign ovarian fibroma or fibrothecoma. Hold off additional work up

## 2023-04-07 NOTE — Assessment & Plan Note (Signed)
#  Secondary erythrocytosis secondary to sleep apnea Previous work up JAK2 V617F mutation negative, with reflex to other mutations CALR, MPL, JAK 2 Ex 12-15 mutations negative. BCR ABL mutation negative She is compliant with CPAP machine.  No need for phlebotomy at this point.  Continue observation.

## 2023-04-07 NOTE — Progress Notes (Signed)
Hematology/Oncology Progress note [phone] [phone]      Patient Care Team: Dale Irwin, MD as PCP - General (Internal Medicine) Iran Ouch, MD as PCP - Cardiology (Cardiology) Rickard Patience, MD as Consulting Physician (Oncology)  REASON FOR VISIT Follow up for treatment of erythrocytosis, liver lesion and adnexal lesion  ASSESSMENT & PLAN:   Liver lesion # liver lesions she has had extensive work up including No hypermetabolism on PET, Enhanced US showed likely benign hemangioma and biopsy was not proceeded. 10/24/2021 enhanced US showed right hepatic dome solid mass is smaller comparing to prior, similar contrast-enhancement characteristics.  Favor benign etiology.  Left hepatic mass appears similar.  Plan to repeat enhanced ultrasound   Erythrocytosis #Secondary erythrocytosis secondary to sleep apnea Previous work up JAK2 [id number] mutation negative, with reflex to other mutations CALR, MPL, JAK 2 Ex 12-15 mutations negative. BCR ABL mutation negative She is compliant with CPAP machine.  No need for phlebotomy at this point.  Continue observation.  Orders Placed This Encounter  Procedures   US LIVER W/CM 1ST LESION    Standing Status:   Future    Standing Expiration Date:   04/06/2024    Order Specific Question:   Reason for Exam (SYMPTOM  OR DIAGNOSIS REQUIRED)    Answer:   history of colon cancer    Order Specific Question:   Preferred imaging location?    Answer:   [facility]   US ABDOMEN LIMITED RUQ (LIVER/GB)    Standing Status:   Future    Standing Expiration Date:   04/06/2024    Order Specific Question:   Reason for Exam (SYMPTOM  OR DIAGNOSIS REQUIRED)    Answer:   history of colon cancer    Order Specific Question:   Preferred imaging location?    Answer:   [facility]   CBC with Differential (Cancer Center Only)    Standing Status:   Future    Standing Expiration Date:   04/06/2024   CMP (Cancer Center only)     Standing Status:   Future    Standing Expiration Date:   04/06/2024   CEA    Standing Status:   Future    Standing Expiration Date:   04/06/2024   Follow-up in 6 months. All questions were answered. The patient knows to call the clinic with any problems, questions or concerns.  Rickard Patience, MD, PhD [facility] Hematology Oncology 04/07/2023    HISTORY OF PRESENTING ILLNESS:  Susan Weeks is a  72 y.o.  female with PMH listed below who was referred to me for evaluation of elevated hemoglobin.  Recent hemoglobin 16.2, hct 47.7, reviewed her previous labs, hemoglobin has been chronically high since 2017, ranging from 15.1 to 16.2.   Pertinent Oncology Histroy  Previous history of carcinoma of colon status post resection, has an intermittent rising CEA.  Status post resection, no evidence of malignancy.-2011 Patient had exploratory laparotomy because of abnormal PET scan following rising CEA and was negative for any malignancy. CEA  remains stable in the range of 5.0   #Erythrocytosis was thought to be secondary, negative for Jak 2 mutation with reflex, BCR ABL mutation negative. # Sleep Apnea, patient establish care with Dr. Terrial Rhodes.   on CPAP machine.  # 03/19/2020, ultrasound abdomen complete showed indeterminate lesions in the dome of liver.  Recommend MRI. 04/04/2020, patient had MRI abdomen with and without contrast Focal hepatic lesions [right dome2.3 x 1.9 cm, left lateral segment ] and  in the right and left hepatic lobes, suspicious for metastatic disease. There is also a potential solid right adnexal lesion measuring 2.6 x 2.4 cm.  Potential ovarian neoplasm.  04/15/2020 US pelvis showed  Questionable visualization of a tiny RIGHT ovary containing 6 mm and 5 mm cysts; no follow-up imaging recommended. Questionable 3.4 cm diameter shadowing mass in RIGHT pelvis versus artifact related to stool within the colon; recommend dedicated CT imaging of the pelvis with IV contrast to  assess.  04/24/2020 PET scan showed the liver lesions are not hypermetabolic.    05/02/2021 Liver lesion was further evaluated by contrast enhanced [place] and Hypoechoic mass in the dome of the right lobe of the liver measuring up to 3.9 cm which demonstrates very early, flash filling diffuse enhancement without evidence of washout. These findings are most compatible with a flash filling hemangioma. Biopsy was not proceeded.   She had CABG x 4 on 05/07/21, hospitalization was complicated by Atrial fibrilliation.   # 10/24/2021 enhanced US showed right hepatic dome solid mass is smaller comparing to prior, similar contrast-enhancement characteristics.  Favor benign etiology.  Left hepatic mass appears similar.   INTERVAL HISTORY Susan Weeks is a 72 y.o. female who has above history reviewed by me today presents for follow-up visit for management of erythrocytosis, history of colon cancer. Patient reports feeling well.  No new complaints. Denies any change of bowel habits, blood in his stool, abdominal pain. She has gained weight  Review of Systems  Constitutional:  Negative for chills, fever, malaise/fatigue and weight loss.  HENT:  Negative for sore throat.   Eyes:  Negative for redness.  Respiratory:  Negative for cough, shortness of breath and wheezing.   Cardiovascular:  Negative for chest pain, palpitations and leg swelling.  Gastrointestinal:  Negative for abdominal pain, blood in stool, nausea and vomiting.  Genitourinary:  Negative for dysuria.  Musculoskeletal:  Negative for myalgias.  Skin:  Negative for rash.  Neurological:  Negative for dizziness, tingling and tremors.  Endo/Heme/Allergies:  Does not bruise/bleed easily.  Psychiatric/Behavioral:  Negative for hallucinations.     MEDICAL HISTORY:  Past Medical History:  Diagnosis Date   Abnormal liver function    Colon cancer (HCC)    adenocarcinoma of the cecum s/p partial colon resection   Coronary artery disease    a.  03/2021 Cor CTA: sev LAD dzs; b. 04/2021 Cath: LM nl, LAD 183m (unable to wire), D1 60, LCX nl, OM3 60, LPAV 100, RCA 90p; c. 05/2021 CABG x 4: LIMA->LAD, VG->RPDA, VG->OM1, VG->OM3.   History of colon polyps    Hyperlipidemia    Hypertension    Post-operative Atrial Fibrillation (HCC)    a. 05/2021 Brief PAF after CABG-->amio.   Sleep apnea 12/28/2017   Systolic murmur    a. 04/2021 Echo: EF 60-65%, no rwma, GrI DD, RVSP 28.33mmHg. Mild-mod MR.    SURGICAL HISTORY: Past Surgical History:  Procedure Laterality Date   ABDOMINAL HYSTERECTOMY  1984   secondary to bleeding and fibroid tumors   APPENDECTOMY     COLON SURGERY     COLONOSCOPY W/ POLYPECTOMY  2001   COLONOSCOPY WITH PROPOFOL N/A 07/28/2016   Procedure: COLONOSCOPY WITH PROPOFOL;  Surgeon: Christena Deem, MD;  Location: [facility] ENDOSCOPY;  Service: Endoscopy;  Laterality: N/A;   COLONOSCOPY WITH PROPOFOL N/A 06/30/2022   Procedure: COLONOSCOPY WITH PROPOFOL;  Surgeon: Regis Bill, MD;  Location: [facility] ENDOSCOPY;  Service: Endoscopy;  Laterality: N/A;   CORONARY ANGIOPLASTY  CORONARY ARTERY BYPASS GRAFT N/A 05/07/2021   Procedure: CORONARY ARTERY BYPASS GRAFTING (CABG) X 4, USING LEFT INTERNAL MAMMARY ARTERY, LEFT LEG GREATER SAPHENOUS VEIN HARVESTED ENDOSCOPICALLY;  Surgeon: Corliss Skains, MD;  Location: [facility] OR;  Service: Open Heart Surgery;  Laterality: N/A;   CORONARY STENT INTERVENTION N/A 04/15/2021   Procedure: CORONARY STENT INTERVENTION;  Surgeon: Iran Ouch, MD;  Location: [facility] INVASIVE CV LAB;  Service: Cardiovascular;  Laterality: N/A;   DILATION AND CURETTAGE OF UTERUS  1967   ENDOVEIN HARVEST OF GREATER SAPHENOUS VEIN Left 05/07/2021   Procedure: ENDOVEIN HARVEST OF GREATER SAPHENOUS VEIN;  Surgeon: Corliss Skains, MD;  Location: [facility] OR;  Service: Open Heart Surgery;  Laterality: Left;   IR THORACENTESIS ASP PLEURAL SPACE W/IMG GUIDE  05/10/2021   LEFT HEART CATH AND CORONARY ANGIOGRAPHY N/A  04/15/2021   Procedure: LEFT HEART CATH AND CORONARY ANGIOGRAPHY;  Surgeon: Iran Ouch, MD;  Location: [facility] INVASIVE CV LAB;  Service: Cardiovascular;  Laterality: N/A;   TEE WITHOUT CARDIOVERSION N/A 05/07/2021   Procedure: TRANSESOPHAGEAL ECHOCARDIOGRAM (TEE);  Surgeon: Corliss Skains, MD;  Location: [facility] OR;  Service: Open Heart Surgery;  Laterality: N/A;    SOCIAL HISTORY: Social History   Socioeconomic History   Marital status: Married    Spouse name: Eddie   Number of children: 1   Years of education: HS   Highest education level: Not on file  Occupational History    Employer: OTHER    Comment: [employer]  Tobacco Use   Smoking status: Former    Current packs/day: 0.00    Types: Cigarettes    Start date: 1968    Quit date: 1978    Years since quitting: 46.9   Smokeless tobacco: Never   Tobacco comments:    1 pack per week--only smoked on the weekends.   Vaping Use   Vaping status: Never Used  Substance and Sexual Activity   Alcohol use: No    Alcohol/week: 0.0 standard drinks of alcohol   Drug use: No   Sexual activity: Not on file  Other Topics Concern   Not on file  Social History Narrative   Not on file   Social Determinants of Health   Financial Resource Strain: Not on file  Food Insecurity: Not on file  Transportation Needs: Not on file  Physical Activity: Not on file  Stress: Not on file  Social Connections: Not on file  Intimate Partner Violence: Not on file    FAMILY HISTORY: Family History  Problem Relation Age of Onset   Stroke Mother    Hypertension Mother    Cancer Father        Prostate    Hypertension Sister        x4   Heart attack Brother    Heart disease Brother        myocardial infarction   Hypertension Brother    Breast cancer Neg Hx     ALLERGIES:  is allergic to silicone, tape, lisinopril, and z-pak [azithromycin].  MEDICATIONS:  Current Outpatient Medications  Medication Sig Dispense Refill    acetaminophen (TYLENOL) 500 MG tablet Take 500 mg by mouth every 6 (six) hours as needed for moderate pain.     amLODipine (NORVASC) 10 MG tablet Take 1 tablet (10 mg total) by mouth daily. Due for follow up visit.  PLEASE CALL OFFICE TO SCHEDULE APPOINTMENT PRIOR TO NEXT REFILL 90 tablet 0   aspirin EC 325 MG EC tablet Take 1  tablet (325 mg total) by mouth daily.     Calcium Carbonate-Vitamin D (CALCIUM 600+D PO) Take 1 tablet by mouth daily.     Evolocumab (REPATHA SURECLICK) 140 MG/ML SOAJ Inject 140 mg into the skin every 14 (fourteen) days. 6 mL 3   metoprolol tartrate (LOPRESSOR) 25 MG tablet TAKE 1 TABLET BY MOUTH TWICE A DAY 180 tablet 0   NON FORMULARY Pt uses a cpap nightly     vitamin E 400 UNIT capsule Take 400 Units by mouth daily.      No current facility-administered medications for this visit.     PHYSICAL EXAMINATION: ECOG PERFORMANCE STATUS: 0 - Asymptomatic Vitals:   04/07/23 1028  BP: 133/63  Pulse: 67  Resp: 18  Temp: (!) 96.7 F (35.9 C)   Filed Weights   04/07/23 1028  Weight: 185 lb (83.9 kg)    Physical Exam Constitutional:      General: She is not in acute distress. HENT:     Head: Normocephalic and atraumatic.  Eyes:     General: No scleral icterus. Cardiovascular:     Rate and Rhythm: Normal rate and regular rhythm.     Heart sounds: Normal heart sounds.  Pulmonary:     Effort: Pulmonary effort is normal. No respiratory distress.     Breath sounds: No wheezing.  Abdominal:     General: Bowel sounds are normal. There is no distension.     Palpations: Abdomen is soft.  Musculoskeletal:        General: No deformity. Normal range of motion.     Cervical back: Normal range of motion and neck supple.  Skin:    General: Skin is warm and dry.     Findings: No erythema or rash.  Neurological:     Mental Status: She is alert and oriented to person, place, and time. Mental status is at baseline.     Cranial Nerves: No cranial nerve deficit.      Coordination: Coordination normal.  Psychiatric:        Mood and Affect: Mood normal.      LABORATORY DATA:  I have reviewed the data as listed Lab Results  Component Value Date   WBC 4.6 03/30/2023   HGB 16.1 (H) 03/30/2023   HCT 46.1 (H) 03/30/2023   MCV 87.6 03/30/2023   PLT 238 03/30/2023   Recent Labs    04/08/22 0732 04/08/22 0732 08/28/22 0813 09/25/22 1347 01/08/23 0947 03/30/23 1334  NA  --    < > 139 140 140 140  K  --    < > 3.7 4.2 3.8 4.0  CL  --    < > 103 105 105 103  CO2  --    < > 27 26 28 25   GLUCOSE  --    < > 93 99 92 98  BUN  --    < > 13 18 22 20   CREATININE  --    < > 0.83 1.05* 0.99 1.14*  CALCIUM  --    < > 8.7 9.0 9.7 9.4  GFRNONAA  --   --   --  56*  --  51*  PROT 6.9  --  6.6 7.2 7.3 7.2  ALBUMIN 3.9  --  4.0 4.3 4.2 4.2  AST 27  --  27 55* 25 46*  ALT 53*  --  44* 74* 52* 54*  ALKPHOS 102  --  93 123 103 108  BILITOT 1.2  --  0.8  0.9 0.8 0.7  BILIDIR 0.3*  --  0.2  --  0.2  --   IBILI 0.9  --   --   --   --   --    < > = values in this interval not displayed.

## 2023-04-07 NOTE — Assessment & Plan Note (Signed)
#  History of colon cancer, chronically elevated CEA,  October 2023 CT chest abdomen pelvis showed no evidence of cancer recurrence. 06/30/2022 colonoscopy no recurrence.

## 2023-05-11 ENCOUNTER — Ambulatory Visit: Payer: BC Managed Care – PPO | Admitting: Internal Medicine

## 2023-05-22 ENCOUNTER — Ambulatory Visit (INDEPENDENT_AMBULATORY_CARE_PROVIDER_SITE_OTHER): Payer: BC Managed Care – PPO | Admitting: Internal Medicine

## 2023-05-22 ENCOUNTER — Encounter: Payer: Self-pay | Admitting: Internal Medicine

## 2023-05-22 VITALS — BP 124/70 | HR 71 | Temp 98.0°F | Resp 16 | Ht 63.0 in | Wt 185.0 lb

## 2023-05-22 DIAGNOSIS — R739 Hyperglycemia, unspecified: Secondary | ICD-10-CM

## 2023-05-22 DIAGNOSIS — I10 Essential (primary) hypertension: Secondary | ICD-10-CM | POA: Diagnosis not present

## 2023-05-22 DIAGNOSIS — E7800 Pure hypercholesterolemia, unspecified: Secondary | ICD-10-CM

## 2023-05-22 DIAGNOSIS — Z85038 Personal history of other malignant neoplasm of large intestine: Secondary | ICD-10-CM

## 2023-05-22 DIAGNOSIS — D582 Other hemoglobinopathies: Secondary | ICD-10-CM

## 2023-05-22 DIAGNOSIS — L97509 Non-pressure chronic ulcer of other part of unspecified foot with unspecified severity: Secondary | ICD-10-CM | POA: Insufficient documentation

## 2023-05-22 DIAGNOSIS — N838 Other noninflammatory disorders of ovary, fallopian tube and broad ligament: Secondary | ICD-10-CM

## 2023-05-22 DIAGNOSIS — R945 Abnormal results of liver function studies: Secondary | ICD-10-CM

## 2023-05-22 DIAGNOSIS — I25119 Atherosclerotic heart disease of native coronary artery with unspecified angina pectoris: Secondary | ICD-10-CM

## 2023-05-22 DIAGNOSIS — G4730 Sleep apnea, unspecified: Secondary | ICD-10-CM

## 2023-05-22 LAB — LIPID PANEL
Cholesterol: 149 mg/dL (ref 0–200)
HDL: 67.1 mg/dL (ref >39.00)
LDL Cholesterol: 67 mg/dL (ref 0–99)
NonHDL: 82.11
Total CHOL/HDL Ratio: 2
Triglycerides: 77 mg/dL (ref 0.0–149.0)
VLDL: 15.4 mg/dL (ref 0.0–40.0)

## 2023-05-22 LAB — BASIC METABOLIC PANEL
BUN: 17 mg/dL (ref 6–23)
CO2: 28 meq/L (ref 19–32)
Calcium: 9.4 mg/dL (ref 8.4–10.5)
Chloride: 103 meq/L (ref 96–112)
Creatinine, Ser: 0.91 mg/dL (ref 0.40–1.20)
GFR: 62.58 mL/min (ref >60.00)
Glucose, Bld: 88 mg/dL (ref 70–99)
Potassium: 3.8 meq/L (ref 3.5–5.1)
Sodium: 139 meq/L (ref 135–145)

## 2023-05-22 LAB — HEPATIC FUNCTION PANEL
ALT: 50 U/L — ABNORMAL HIGH (ref 0–35)
AST: 30 U/L (ref 0–37)
Albumin: 4.4 g/dL (ref 3.5–5.2)
Alkaline Phosphatase: 100 U/L (ref 39–117)
Bilirubin, Direct: 0.2 mg/dL (ref 0.0–0.3)
Total Bilirubin: 0.9 mg/dL (ref 0.2–1.2)
Total Protein: 6.9 g/dL (ref 6.0–8.3)

## 2023-05-22 LAB — HEMOGLOBIN A1C: Hgb A1c MFr Bld: 6 % (ref 4.6–6.5)

## 2023-05-22 MED ORDER — MUPIROCIN 2 % EX OINT: 1.0000 | TOPICAL_OINTMENT | Freq: Two times a day (BID) | CUTANEOUS | 0 refills | Status: AC

## 2023-05-22 NOTE — Assessment & Plan Note (Signed)
Followed by hematology as outlined.  Continue cpap.  

## 2023-05-22 NOTE — Assessment & Plan Note (Signed)
Continue cpap.  

## 2023-05-22 NOTE — Assessment & Plan Note (Signed)
Low carb diet and exercise.  Follow met b and a1c.   

## 2023-05-22 NOTE — Assessment & Plan Note (Signed)
Had swelling on lisinopril.  On amlodipine. Blood pressure as outlined..  Follow pressures.  Follow metabolic panel.  

## 2023-05-22 NOTE — Assessment & Plan Note (Signed)
Recently evaluated by GI - f/u elevated liver enzymes and recommended f/u colonoscopy.  Colonoscopy 06/2022 - unremarkable.  Recommended f/u in 5 years. Saw Dr Cathie Hoops 01/2022 - ordered CT chest/abd/pelvis.  F/u colonoscopy 06/2022 - recommended f/u colonoscopy in 5 years.

## 2023-05-22 NOTE — Assessment & Plan Note (Signed)
On Repatha.  Low cholesterol diet and exercise.  Follow lipid panel and liver function tests.   Lab Results  Component Value Date   CHOL 149 01/08/2023   HDL 74.30 01/08/2023   LDLCALC 61 01/08/2023   LDLDIRECT 53.0 07/18/2021   TRIG 67.0 01/08/2023   CHOLHDL 2 01/08/2023

## 2023-05-22 NOTE — Assessment & Plan Note (Signed)
Lesion - fifth toe - adjacent to nail as outlined.  Bactroban as outlined.  Follow.

## 2023-05-22 NOTE — Progress Notes (Addendum)
Subjective:    [name]: Susan Weeks, female    DOB: 21-Mar-1951, 73 y.o.   MRN: [id number]  Patient here for  Chief Complaint  Patient presents with   Medical Management of Chronic Issues    HPI Here for a scheduled follow up - f/u regarding hypertension, CAD and hypercholesterolemia. Had f/u with Dr Cathie Hoops - 04/07/23 - f/u liver lesion - extensive w/up -  No hypermetabolism on PET, Enhanced US showed likely benign hemangioma and biopsy was not proceeded. 10/24/2021 enhanced US showed right hepatic dome solid mass is smaller comparing to prior, similar contrast-enhancement characteristics.  Favor benign etiology.  Left hepatic mass appears similar.  Plan to repeat enhanced ultrasound. Also f/u regarding secondary erythrocytosis secondary to sleep apnea.  Compliant with cpap. Also had f/u with cardiology 03/31/23 - s/p CABG x 4 05/2021. Continues on amlodipine, metoprolol, aspirin and repatha. Stable. Exercises regularly. No chest pain or sob. Feels good.  Bowels stable. She does report had pedicure two days ago. States that they cut into her skin around the nail. Some discomfort and redness. Has been applying neosporin.    Past Medical History:  Diagnosis Date   Abnormal liver function    Colon cancer (HCC)    adenocarcinoma of the cecum s/p partial colon resection   Coronary artery disease    a. 03/2021 Cor CTA: sev LAD dzs; b. 04/2021 Cath: LM nl, LAD 122m (unable to wire), D1 60, LCX nl, OM3 60, LPAV 100, RCA 90p; c. 05/2021 CABG x 4: LIMA->LAD, VG->RPDA, VG->OM1, VG->OM3.   History of colon polyps    Hyperlipidemia    Hypertension    Post-operative Atrial Fibrillation (HCC)    a. 05/2021 Brief PAF after CABG-->amio.   Sleep apnea 12/28/2017   Systolic murmur    a. 04/2021 Echo: EF 60-65%, no rwma, GrI DD, RVSP 28.60mmHg. Mild-mod MR.   Past Surgical History:  Procedure Laterality Date   ABDOMINAL HYSTERECTOMY  1984   secondary to bleeding and fibroid tumors   APPENDECTOMY      COLON SURGERY     COLONOSCOPY W/ POLYPECTOMY  2001   COLONOSCOPY WITH PROPOFOL N/A 07/28/2016   Procedure: COLONOSCOPY WITH PROPOFOL;  Surgeon: Christena Deem, MD;  Location: [facility] ENDOSCOPY;  Service: Endoscopy;  Laterality: N/A;   COLONOSCOPY WITH PROPOFOL N/A 06/30/2022   Procedure: COLONOSCOPY WITH PROPOFOL;  Surgeon: Regis Bill, MD;  Location: [facility] ENDOSCOPY;  Service: Endoscopy;  Laterality: N/A;   CORONARY ANGIOPLASTY     CORONARY ARTERY BYPASS GRAFT N/A 05/07/2021   Procedure: CORONARY ARTERY BYPASS GRAFTING (CABG) X 4, USING LEFT INTERNAL MAMMARY ARTERY, LEFT LEG GREATER SAPHENOUS VEIN HARVESTED ENDOSCOPICALLY;  Surgeon: Corliss Skains, MD;  Location: [facility] OR;  Service: Open Heart Surgery;  Laterality: N/A;   CORONARY STENT INTERVENTION N/A 04/15/2021   Procedure: CORONARY STENT INTERVENTION;  Surgeon: Iran Ouch, MD;  Location: [facility] INVASIVE CV LAB;  Service: Cardiovascular;  Laterality: N/A;   DILATION AND CURETTAGE OF UTERUS  1967   ENDOVEIN HARVEST OF GREATER SAPHENOUS VEIN Left 05/07/2021   Procedure: ENDOVEIN HARVEST OF GREATER SAPHENOUS VEIN;  Surgeon: Corliss Skains, MD;  Location: [facility] OR;  Service: Open Heart Surgery;  Laterality: Left;   IR THORACENTESIS ASP PLEURAL SPACE W/IMG GUIDE  05/10/2021   LEFT HEART CATH AND CORONARY ANGIOGRAPHY N/A 04/15/2021   Procedure: LEFT HEART CATH AND CORONARY ANGIOGRAPHY;  Surgeon: Iran Ouch, MD;  Location: [facility] INVASIVE CV LAB;  Service: Cardiovascular;  Laterality:  N/A;   TEE WITHOUT CARDIOVERSION N/A 05/07/2021   Procedure: TRANSESOPHAGEAL ECHOCARDIOGRAM (TEE);  Surgeon: Corliss Skains, MD;  Location: [facility] OR;  Service: Open Heart Surgery;  Laterality: N/A;   Family History  Problem Relation Age of Onset   Stroke Mother    Hypertension Mother    Cancer Father        Prostate    Hypertension Sister        x4   Heart attack Brother    Heart disease Brother        myocardial infarction    Hypertension Brother    Breast cancer Neg Hx    Social History   Socioeconomic History   Marital status: Married    Spouse name: Eddie   Number of children: 1   Years of education: HS   Highest education level: Not on file  Occupational History    Employer: OTHER    Comment: [employer]  Tobacco Use   Smoking status: Former    Current packs/day: 0.00    Types: Cigarettes    Start date: 1968    Quit date: 1978    Years since quitting: 47.0   Smokeless tobacco: Never   Tobacco comments:    1 pack per week--only smoked on the weekends.   Vaping Use   Vaping status: Never Used  Substance and Sexual Activity   Alcohol use: No    Alcohol/week: 0.0 standard drinks of alcohol   Drug use: No   Sexual activity: Not on file  Other Topics Concern   Not on file  Social History Narrative   Not on file   Social Drivers of Health   Financial Resource Strain: Not on file  Food Insecurity: Not on file  Transportation Needs: Not on file  Physical Activity: Not on file  Stress: Not on file  Social Connections: Not on file     Review of Systems  Constitutional:  Negative for appetite change and unexpected weight change.  HENT:  Negative for congestion and sinus pressure.   Respiratory:  Negative for cough, chest tightness and shortness of breath.   Cardiovascular:  Negative for chest pain, palpitations and leg swelling.  Gastrointestinal:  Negative for abdominal pain, diarrhea, nausea and vomiting.  Genitourinary:  Negative for difficulty urinating and dysuria.  Musculoskeletal:  Negative for joint swelling and myalgias.  Skin:  Negative for color change and rash.  Neurological:  Negative for dizziness and headaches.  Psychiatric/Behavioral:  Negative for agitation and dysphoric mood.        Objective:     BP 124/70   Pulse 71   Temp 98 F (36.7 C)   Resp 16   Ht 5\' 3"  (1.6 m)   Wt 185 lb (83.9 kg)   LMP 06/28/1982   SpO2 98%   BMI 32.77 kg/m  Wt Readings  from Last 3 Encounters:  05/22/23 185 lb (83.9 kg)  04/07/23 185 lb (83.9 kg)  03/31/23 181 lb 12.8 oz (82.5 kg)    Physical Exam Vitals reviewed.  Constitutional:      General: She is not in acute distress.    Appearance: Normal appearance.  HENT:     Head: Normocephalic and atraumatic.     Right Ear: External ear normal.     Left Ear: External ear normal.     Mouth/Throat:     Pharynx: No oropharyngeal exudate or posterior oropharyngeal erythema.  Eyes:     General: No scleral icterus.  Right eye: No discharge.        Left eye: No discharge.     Conjunctiva/sclera: Conjunctivae normal.  Neck:     Thyroid: No thyromegaly.  Cardiovascular:     Rate and Rhythm: Normal rate and regular rhythm.  Pulmonary:     Effort: No respiratory distress.     Breath sounds: Normal breath sounds. No wheezing.  Abdominal:     General: Bowel sounds are normal.     Palpations: Abdomen is soft.     Tenderness: There is no abdominal tenderness.  Musculoskeletal:        General: No swelling or tenderness.     Cervical back: Neck supple. No tenderness.  Lymphadenopathy:     Cervical: No cervical adenopathy.  Skin:    Findings: No rash.     Comments: Left fifth toe - adjacent to toe nail - minimal erythema.   Neurological:     Mental Status: She is alert.  Psychiatric:        Mood and Affect: Mood normal.        Behavior: Behavior normal.         Outpatient Encounter Medications as of 05/22/2023  Medication Sig   acetaminophen (TYLENOL) 500 MG tablet Take 500 mg by mouth every 6 (six) hours as needed for moderate pain.   amLODipine (NORVASC) 10 MG tablet Take 1 tablet (10 mg total) by mouth daily. Due for follow up visit.  PLEASE CALL OFFICE TO SCHEDULE APPOINTMENT PRIOR TO NEXT REFILL   aspirin EC 325 MG EC tablet Take 1 tablet (325 mg total) by mouth daily.   Calcium Carbonate-Vitamin D (CALCIUM 600+D PO) Take 1 tablet by mouth daily.   Evolocumab (REPATHA SURECLICK) 140 MG/ML  SOAJ Inject 140 mg into the skin every 14 (fourteen) days.   metoprolol tartrate (LOPRESSOR) 25 MG tablet TAKE 1 TABLET BY MOUTH TWICE A DAY   mupirocin ointment (BACTROBAN) 2 % Apply 1 Application topically 2 (two) times daily.   NON FORMULARY Pt uses a cpap nightly   vitamin E 400 UNIT capsule Take 400 Units by mouth daily.    No facility-administered encounter medications on file as of 05/22/2023.     Lab Results  Component Value Date   WBC 4.6 03/30/2023   HGB 16.1 (H) 03/30/2023   HCT 46.1 (H) 03/30/2023   PLT 238 03/30/2023   GLUCOSE 98 03/30/2023   CHOL 149 01/08/2023   TRIG 67.0 01/08/2023   HDL 74.30 01/08/2023   LDLDIRECT 53.0 07/18/2021   LDLCALC 61 01/08/2023   ALT 54 (H) 03/30/2023   AST 46 (H) 03/30/2023   NA 140 03/30/2023   K 4.0 03/30/2023   CL 103 03/30/2023   CREATININE 1.14 (H) 03/30/2023   BUN 20 03/30/2023   CO2 25 03/30/2023   TSH 0.89 01/08/2023   INR 1.0 11/15/2021   HGBA1C 5.9 01/08/2023    MM 3D SCREENING MAMMOGRAM BILATERAL BREAST Result Date: 03/19/2023 CLINICAL DATA:  Screening. EXAM: DIGITAL SCREENING BILATERAL MAMMOGRAM WITH TOMOSYNTHESIS AND CAD TECHNIQUE: Bilateral screening digital craniocaudal and mediolateral oblique mammograms were obtained. Bilateral screening digital breast tomosynthesis was performed. The images were evaluated with computer-aided detection. COMPARISON:  Previous exam(s). ACR Breast Density Category b: There are scattered areas of fibroglandular density. FINDINGS: There are no findings suspicious for malignancy. IMPRESSION: No mammographic evidence of malignancy. A result letter of this screening mammogram will be mailed directly to the patient. RECOMMENDATION: Screening mammogram in one year. (Code:SM-B-01Y) BI-RADS CATEGORY  1: Negative.  Electronically Signed   By: Amie Portland M.D.   On: 03/19/2023 16:20       Assessment & Plan:  Essential hypertension, benign Assessment & Plan: Had swelling on lisinopril.  On  amlodipine. Blood pressure as outlined. Follow pressures.  Follow metabolic panel.   Orders: -     Basic metabolic panel  Hyperglycemia Assessment & Plan: Low carb diet and exercise.  Follow met b and a1c.   Orders: -     Hemoglobin A1c  Pure hypercholesterolemia Assessment & Plan: On Repatha.  Low cholesterol diet and exercise.  Follow lipid panel and liver function tests.   Lab Results  Component Value Date   CHOL 149 01/08/2023   HDL 74.30 01/08/2023   LDLCALC 61 01/08/2023   LDLDIRECT 53.0 07/18/2021   TRIG 67.0 01/08/2023   CHOLHDL 2 01/08/2023    Orders: -     Hepatic function panel -     Lipid panel  Sleep apnea, unspecified type Assessment & Plan: Continue cpap.   Ovarian mass Assessment & Plan: Hematology -  follows for ovarian mass.  Had f/u MRI 10/06/22 - Intrinsically T2 hypointense mass of the right ovary with ow-level homogeneous internal contrast enhancement is unchanged, measuring 3.2 x 2.3 cm. 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 No activity on previous PET.    History of colon cancer Assessment & Plan: Recently evaluated by GI - f/u elevated liver enzymes and recommended f/u colonoscopy.  Colonoscopy 06/2022 - unremarkable.  Recommended f/u in 5 years. Saw Dr Cathie Hoops 01/2022 - ordered CT chest/abd/pelvis.  F/u colonoscopy 06/2022 - recommended f/u colonoscopy in 5 years.      Elevated hemoglobin (HCC) Assessment & Plan: Followed by hematology as outlined.  Continue cpap.    Coronary artery disease involving native coronary artery of native heart with angina pectoris [facility]) Assessment & Plan: Doing well on amlodipine, metoprolol.  Off statin due to liver enzyme elevation. No pain.  On repatha. Follow.    Abnormal liver function Assessment & Plan: Off statin due to elevated liver function tests.  Being evaluated by oncology.  Dr Cathie Hoops - ordered f/u abdominal ultrasound - liver mass unchanged/smaller - favored benign  lesion.  F/u Dr Cathie Hoops - f/u enhanced ultrasound recommended one year.  F/u ultrasound - Continued slight decreased size of previously visualized right hepatic dome hyperechoic mass with contrast enhancement characteristics which again favored benign etiology such as hemangioma. Previously visualized left lobe hepatic mass is no longer conspicuous. Follow liver function tests.    Sore on toe [facility]) Assessment & Plan: Lesion - fifth toe - adjacent to nail as outlined.  Bactroban as outlined.  Follow.    Other orders -     Mupirocin; Apply 1 Application topically 2 (two) times daily.  Dispense: 22 g; Refill: 0     Dale [place], MD

## 2023-05-22 NOTE — Assessment & Plan Note (Signed)
Doing well on amlodipine, metoprolol.  Off statin due to liver enzyme elevation. No pain.  On repatha. Follow.

## 2023-05-22 NOTE — Assessment & Plan Note (Signed)
Hematology -  follows for ovarian mass.  Had f/u MRI 10/06/22 - Intrinsically T2 hypointense mass of the right ovary with ow-level homogeneous internal contrast enhancement is unchanged, measuring 3.2 x 2.3 cm. 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 No activity on previous PET.

## 2023-05-22 NOTE — Assessment & Plan Note (Signed)
Off statin due to elevated liver function tests.  Being evaluated by oncology.  Dr Cathie Hoops - ordered f/u abdominal ultrasound - liver mass unchanged/smaller - favored benign lesion.  F/u Dr Cathie Hoops - f/u enhanced ultrasound recommended one year.  F/u ultrasound - Continued slight decreased size of previously visualized right hepatic dome hyperechoic mass with contrast enhancement characteristics which again favored benign etiology such as hemangioma. Previously visualized left lobe hepatic mass is no longer conspicuous. Follow liver function tests.

## 2023-06-14 ENCOUNTER — Other Ambulatory Visit: Payer: Self-pay | Admitting: Cardiovascular Disease

## 2023-06-20 ENCOUNTER — Other Ambulatory Visit: Payer: Self-pay | Admitting: Cardiovascular Disease

## 2023-07-08 ENCOUNTER — Ambulatory Visit: Payer: BC Managed Care – PPO | Admitting: Internal Medicine

## 2023-07-08 ENCOUNTER — Encounter: Payer: Self-pay | Admitting: Internal Medicine

## 2023-07-08 VITALS — BP 118/66 | HR 66 | Temp 97.7°F | Ht 63.0 in | Wt 187.6 lb

## 2023-07-08 DIAGNOSIS — G4733 Obstructive sleep apnea (adult) (pediatric): Secondary | ICD-10-CM | POA: Diagnosis not present

## 2023-07-08 DIAGNOSIS — J986 Disorders of diaphragm: Secondary | ICD-10-CM

## 2023-07-08 NOTE — Progress Notes (Signed)
 [facility]    Constitutional:  LMP 06/28/1982   Past Medical History:  HTN, HLD, Colon cancer, CAD s/p CABG  Past Surgical History:  Her  has a past surgical history that includes Appendectomy; Abdominal hysterectomy (1984); Colonoscopy with propofol (N/A, 07/28/2016); Colonoscopy w/ polypectomy (2001); LEFT HEART CATH AND CORONARY ANGIOGRAPHY (N/A, 04/15/2021); CORONARY STENT INTERVENTION (N/A, 04/15/2021); Dilation and curettage of uterus (1967); IR THORACENTESIS ASP PLEURAL SPACE W/IMG GUIDE (05/10/2021); Coronary artery bypass graft (N/A, 05/07/2021); TEE without cardioversion (N/A, 05/07/2021); Endoharvest vein of greater saphenous vein (Left, 05/07/2021); Colon surgery; Coronary angioplasty; and Colonoscopy with propofol (N/A, 06/30/2022).  Brief Summary:  Susan Weeks is a 73 y.o. female with obstructive sleep apnea.     CC Follow up assessment of OSA Follow up assessment of Left hemidiaphragm  Subjective:   Follow-up assessment for OSA CPAP download for the last 30 days reviewed in detail with patient Excellent compliance report at 100% AHI significant reduced down to 1.2 Auto CPAP 5-15 Patient uses and benefits from therapy Using CPAP nightly.  pressure setting is comfortable and she is sleeping well.  Assessment for her paralyzed diaphragm Not short of breath.   Has very good exercise capacity and tolerance  She doesn't feel like her breathing limits her activities currently.  She does not feel short of breath laying flat or when she bends over.  Not having cough, wheeze, sputum, or chest pain.  Cardiac history  have significant coronary artery disease.  She had CABG in January 2023  She had persistent Lt pleural effusion after surgery.  Later it was determined that her Lt diaphragm was paralyzed.     Physical Exam:   BP 118/66 (BP Location: Left Arm, Patient Position: Sitting, Cuff Size: Normal)   Pulse 66   Temp  97.7 F (36.5 C) (Temporal)   Ht 5\' 3"  (1.6 m)   Wt 187 lb 9.6 oz (85.1 kg)   LMP 06/28/1982   SpO2 98%   BMI 33.23 kg/m      Review of Systems: Gen:  Denies  fever, sweats, chills weight loss  HEENT: Denies blurred vision, double vision, ear pain, eye pain, hearing loss, nose bleeds, sore throat Cardiac:  No dizziness, chest pain or heaviness, chest tightness,edema, No JVD Resp:   No cough, -sputum production, -shortness of breath,-wheezing, -hemoptysis,  Other:  All other systems negative   Physical Examination:   General Appearance: No distress  EYES PERRLA, EOM intact.   NECK Supple, No JVD Pulmonary: normal breath sounds, No wheezing.  CardiovascularNormal S1,S2.  No m/r/g.   Abdomen: Benign, Soft, non-tender. Neurology UE/LE 5/5 strength, no focal deficits Ext pulses intact, cap refill intact ALL OTHER ROS ARE NEGATIVE   Chest Imaging:  Sniff test 07/24/21 >> paradoxical motion of Lt diaphragm with inspiration  Sleep Tests:  HST 12/28/17 >> AHI 8.5, SpO2 low 72% Auto CPAP 07/22/21 to 08/20/21 >> used on 30 of 30 nights with average 8 hrs 23 min.  Average AHI 0.7 with median CPAP 9 and 95 th percentile CPAP 13 cm H2O  Cardiac Tests:  Echo 04/19/21 >> EF 60 to 65%, grade 1 DD, RVSP 28.8 mmHg, mild/mod MR   Social History:  She  reports that she quit smoking about 47 years ago. Her smoking use included cigarettes. She started smoking about 57 years ago. She has never used smokeless tobacco. She reports that she does not drink alcohol and does not use drugs.  Family History:  Her family history includes Cancer in her father; Heart attack in her brother; Heart disease in her brother; Hypertension in her brother, mother, and sister; Stroke in her mother.     Assessment/Plan:  1 year old African-American female with diagnosis of sleep apnea   Assessment of Sleep apnea Patient has excellent compliance report Discussed in detail with patient OSA is well-controlled  with CPAP Continue current prescription Auto CPAP 5-15 AHI reduced to 1.2 she is compliant with CPAP and reports benefit from therapy she used [employer] for her DME  Patient Instructions Continue to use CPAP every night, minimum of 4-6 hours a night.  Change equipment every 30 days or as directed by DME.  Wash your tubing with warm soap and water daily, hang to dry. Wash humidifier portion weekly. Use bottled, distilled water and change daily   Be aware of reduced alertness and do not drive or operate heavy machinery if experiencing this or drowsiness.  Exercise encouraged, as tolerated. Encouraged proper weight management.  Important to get eight or more hours of sleep  Limiting the use of the computer and television before bedtime.  Decrease naps during the day, so night time sleep will become enhanced.  Limit caffeine, and sleep deprivation.  HTN, stroke, uncontrolled diabetes and heart failure are potential risk factors.  Risk of untreated sleep apnea including cardiac arrhthymias, stroke, DM, pulm HTN.    Lt diaphragm paralysis and Lt pleural effusion after CABG in January 2023. - no respiratory symptoms - she will follow up with Dr. Cliffton Asters with cardiothoracic surgery in June 2023 as needed Continue exercise as tolerated, no restrictions at this time  Secondary polycythemia. - followed by Dr. Rickard Patience with hematology/oncology at [facility] in [place]    MEDICATION ADJUSTMENTS/LABS AND TESTS ORDERED: Continue CPAP as prescribed  Avoid Allergens and Irritants Avoid secondhand smoke Avoid SICK contacts Recommend  Masking  when appropriate Recommend Keep up-to-date with vaccinations   CURRENT MEDICATIONS REVIEWED AT LENGTH WITH PATIENT TODAY   Patient  satisfied with Plan of action and management. All questions answered   Follow up 6 months   I spent a total of 41 minutes reviewing chart data, face-to-face evaluation with the patient, counseling and  coordination of care as detailed above.      Lucie Leather, M.D.  [facility]  Medical Director [facility] Medical Director [facility] Cardio-Pulmonary Department

## 2023-07-08 NOTE — Patient Instructions (Signed)
 Excellent Job A+ GOLD STAR!!  Continue CPAP as prescribed  Avoid Allergens and Irritants Avoid secondhand smoke Avoid SICK contacts Recommend  Masking  when appropriate Recommend Keep up-to-date with vaccinations   Be aware of reduced alertness and do not drive or operate heavy machinery if experiencing this or drowsiness.  Exercise encouraged, as tolerated. Encouraged proper weight management.  Important to get eight or more hours of sleep  Limiting the use of the computer and television before bedtime.  Decrease naps during the day, so night time sleep will become enhanced.  Limit caffeine, and sleep deprivation.

## 2023-07-28 ENCOUNTER — Telehealth: Payer: Self-pay | Admitting: Nurse Practitioner

## 2023-07-28 DIAGNOSIS — E785 Hyperlipidemia, unspecified: Secondary | ICD-10-CM

## 2023-07-28 MED ORDER — REPATHA SURECLICK 140 MG/ML ~~LOC~~ SOAJ
140.0000 mg | SUBCUTANEOUS | 3 refills | Status: DC
Start: 2023-07-28 — End: 2023-07-29

## 2023-07-28 NOTE — Telephone Encounter (Signed)
*  STAT* If patient is at the pharmacy, call can be transferred to refill team.   1. Which medications need to be refilled? (please list name of each medication and dose if known) repatha sureclick 140 mg   2. Would you like to learn more about the convenience, safety, & potential cost savings by using the [facility]? **no**     3. Are you open to using the [facility] (Type [facility]. no).   4. Which pharmacy/location (including street and city if local pharmacy) is medication to be sent to?[facility] [address]   5. Do they need a 30 day or 90 day supply? 90

## 2023-07-29 MED ORDER — REPATHA SURECLICK 140 MG/ML ~~LOC~~ SOAJ: 140.0000 mg | SUBCUTANEOUS | 3 refills | Status: AC

## 2023-07-29 NOTE — Addendum Note (Signed)
 Addended by: Malena Peer D on: 07/29/2023 11:29 AM   Modules accepted: Orders

## 2023-09-04 ENCOUNTER — Telehealth: Payer: Self-pay | Admitting: Cardiovascular Disease

## 2023-09-04 NOTE — Telephone Encounter (Signed)
 Called patient, advised that she has been having some left ankle swelling for the last 2 weeks. She denies she states that she does not have any new SOB. She states the swelling does improve over night when elevated, but when she is up and walking during the day it comes back. Patient states that she does not know if she has had weight gain, but can "feel a difference in her clothes".   Patient advised that she did take a fluid pill at one time but not longer takes anything. She states it is only swelling in her left ankle.   Patient advised she has upcoming appointment with PCP on 05/21- to keep this appointment. Upcoming appointment on 05/27 with NP- advised to keep this, however she would like to know if she should be seen sooner.   Advised I would route to NP to review further.

## 2023-09-04 NOTE — Telephone Encounter (Signed)
 Pt c/o swelling: STAT is pt has developed SOB within 24 hours  How much weight have you gained and in what time span?  Patient hasn't weighed, but she says she can tell she's gained wait from how her clothes fit  If swelling, where is the swelling located?  Left ankle--patient says she has been soaking in epsom salt since last week.  Are you currently taking a fluid pill?  No   Are you currently SOB?  SOB  Do you have a log of your daily weights (if so, list)?   Have you gained 3 pounds in a day or 5 pounds in a week?   Have you traveled recently?  No traveling, but patient says she tries to elevate her leg + she can feel the difference when she doesn't

## 2023-09-07 NOTE — Telephone Encounter (Signed)
 Called patient, advised of the message below.   Patient verbalized understanding, appointments on 16th were taken, moved her up to the 22nd.   Patient thankful for call back, aware if anyone cancels we can let her know.

## 2023-09-09 IMAGING — DX DG CHEST 1V PORT
1 series · 1 of 1 positions shown · non-contrast
Comparison: Portable chest 05/07/2021 and earlier.

CLINICAL DATA: 70-year-old female status post CABG postoperative
day 1.

EXAM:
PORTABLE CHEST 1 VIEW

[chest]
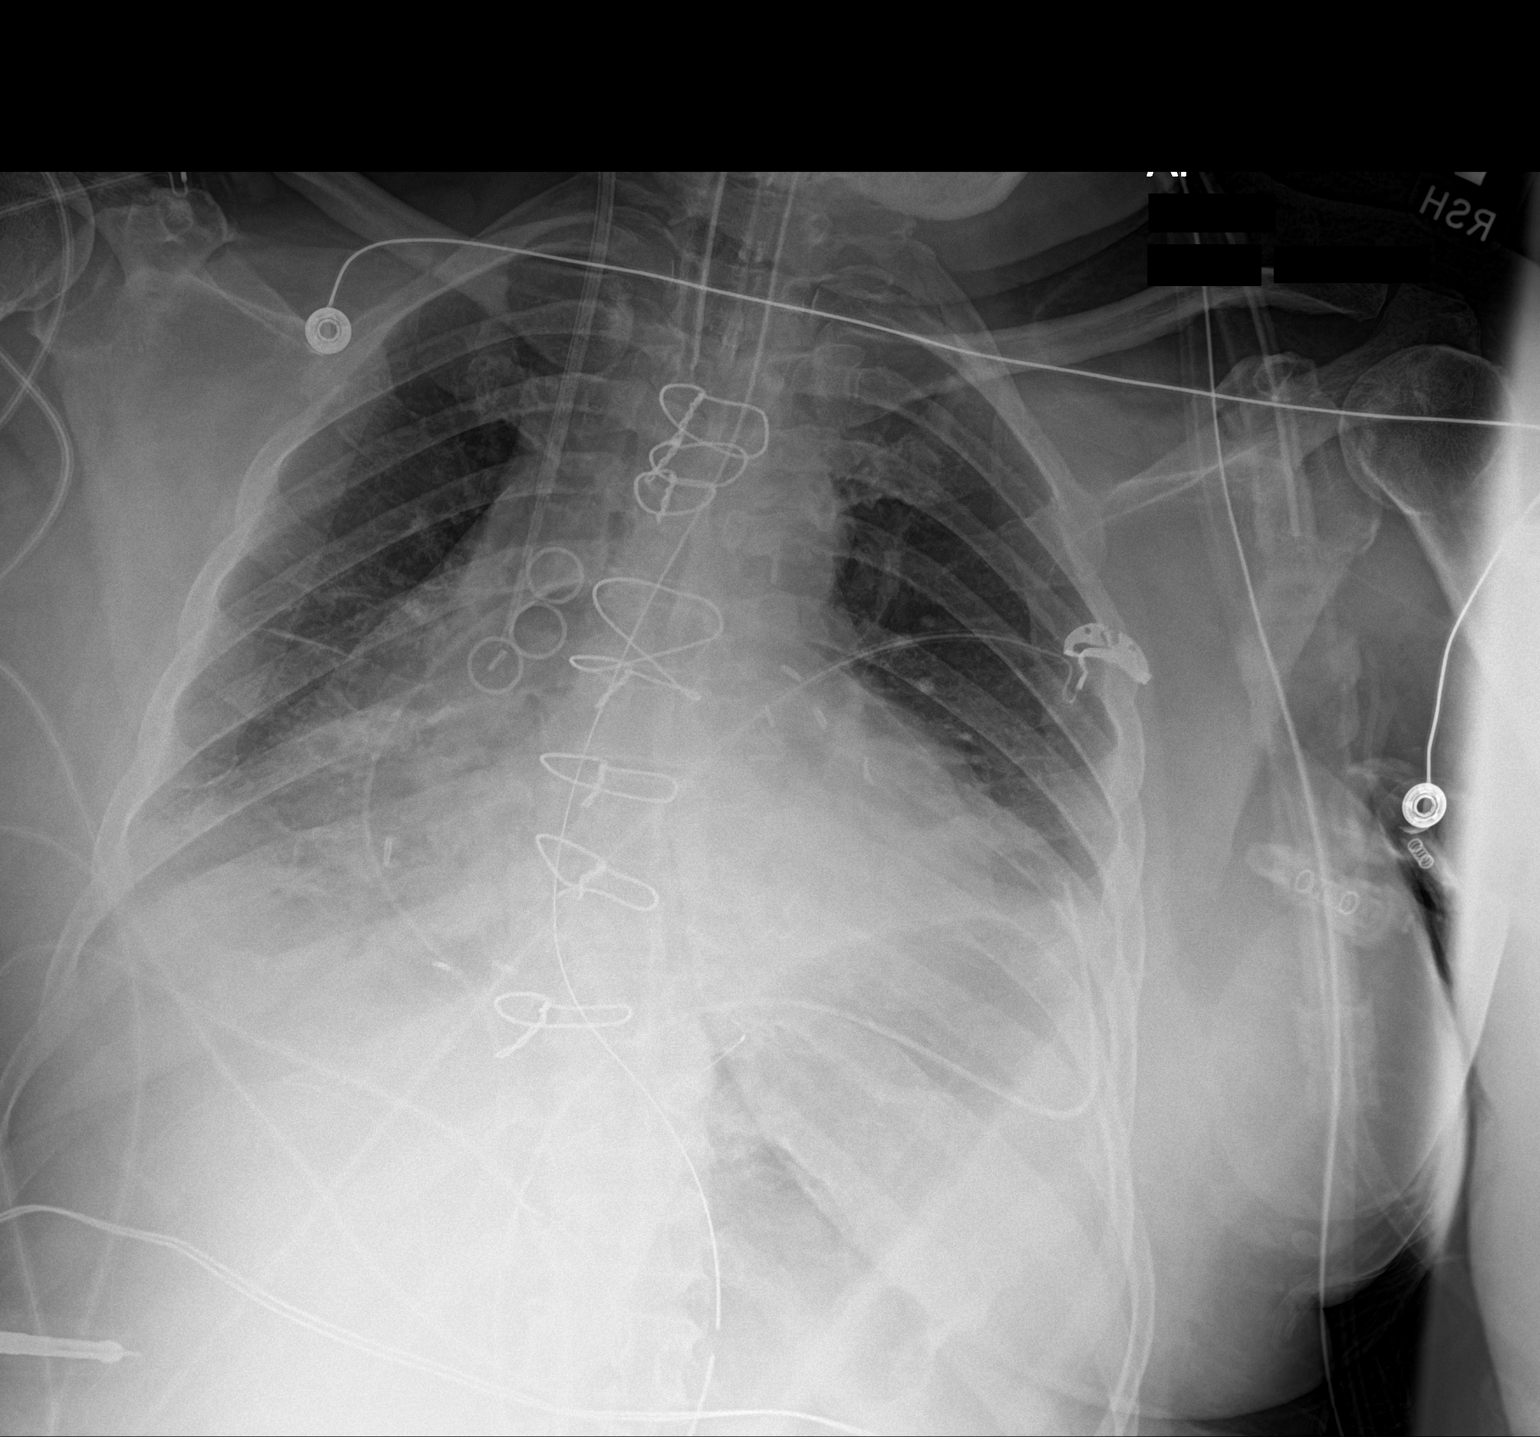

[1 of 1 positions shown; findings below may reference images not displayed]

FINDINGS: Portable AP semi upright view at 4270 hours. Endotracheal tube tip
now at the level the clavicles. Stable enteric tube, side hole
projects over the gastric body. Stable dual lumen right IJ central
line. Bilateral chest tubes remain in place.

Lower lung volumes. Stable cardiac size and mediastinal contours.
Sequelae of CABG.

No pneumothorax. No pulmonary edema. Indistinct bibasilar opacity is
increased. No air bronchograms. Mild gaseous distension of the
stomach.
IMPRESSION: 1. Satisfactory lines and tubes.
2. Lower lung volumes with increased bibasilar atelectasis or small
pleural effusions. No pneumothorax or pulmonary edema.

## 2023-09-10 IMAGING — DX DG CHEST 1V PORT
1 series · 1 of 1 positions shown · non-contrast
Comparison: May 08, 2021.

CLINICAL DATA: Status post cardiac surgery.

EXAM:
PORTABLE CHEST 1 VIEW

[chest]
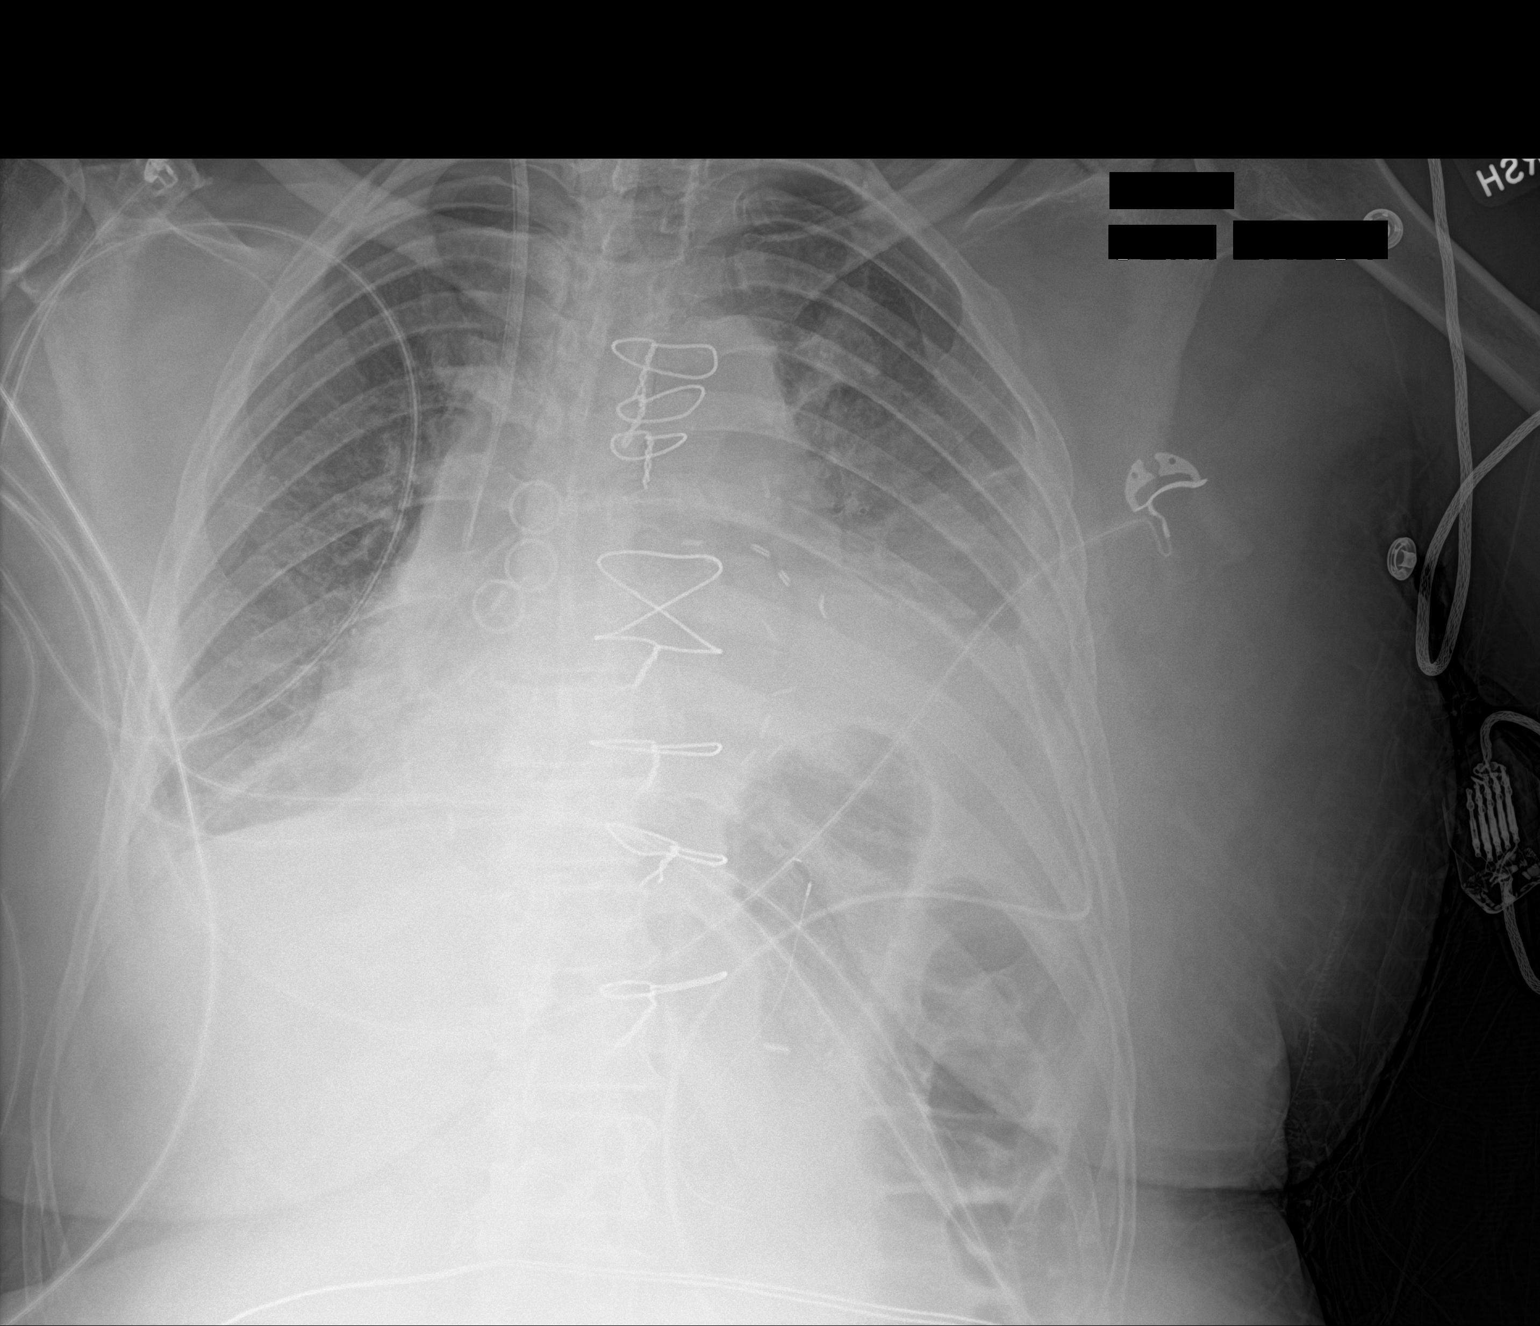

[1 of 1 positions shown; findings below may reference images not displayed]

FINDINGS: Stable cardiomegaly. Endotracheal and nasogastric tubes have been
removed. Right internal jugular catheter is stable. Left-sided chest
tube is noted without pneumothorax. Left pleural effusion is noted
with associated atelectasis. Right basilar atelectasis and small
pleural effusion is noted as well. Bony thorax is unremarkable.
IMPRESSION: Stable position of left-sided chest tube without pneumothorax.
Endotracheal and nasogastric tubes have been removed. Bibasilar
atelectasis and small pleural effusions are noted.

## 2023-09-11 IMAGING — CR DG CHEST 2V
2 series · 2 of 2 positions shown · non-contrast
Comparison: Chest radiograph dated May 09, 2021

CLINICAL DATA: Status post CABG.

EXAM:
CHEST - 2 VIEW

[chest lat]
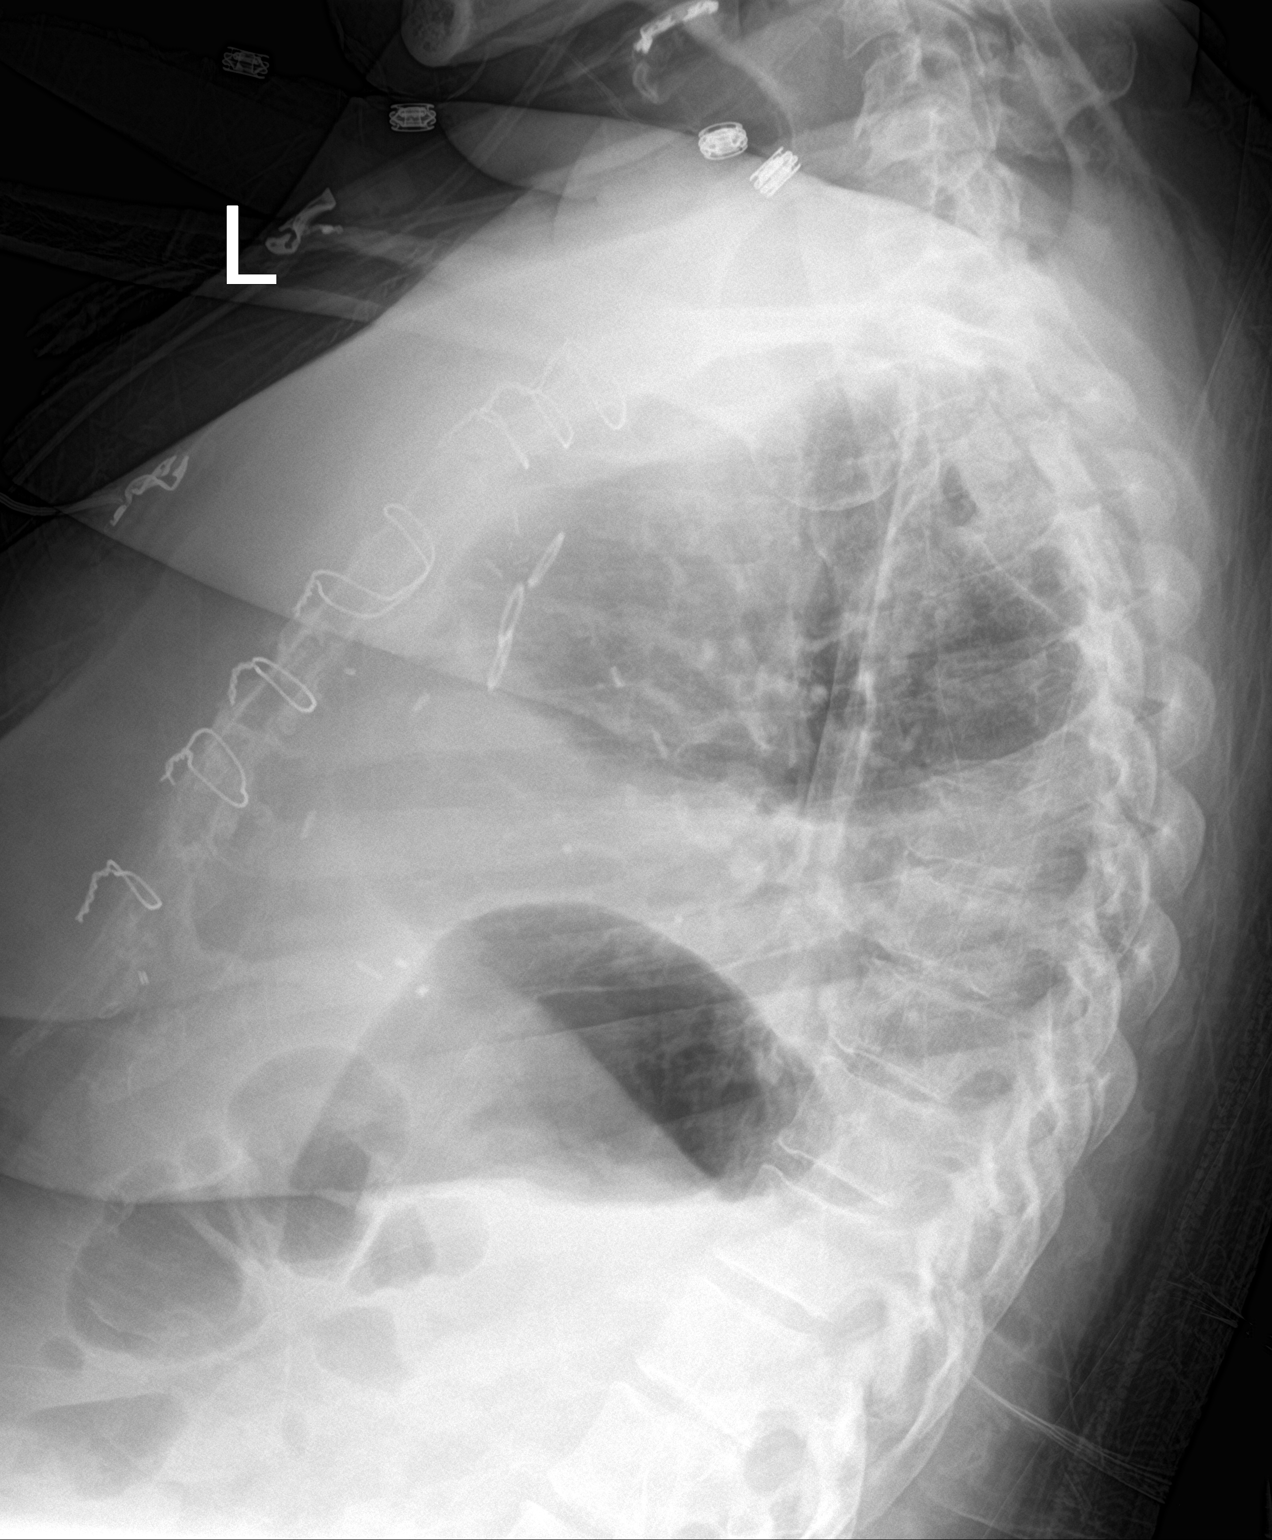

[chest ap]
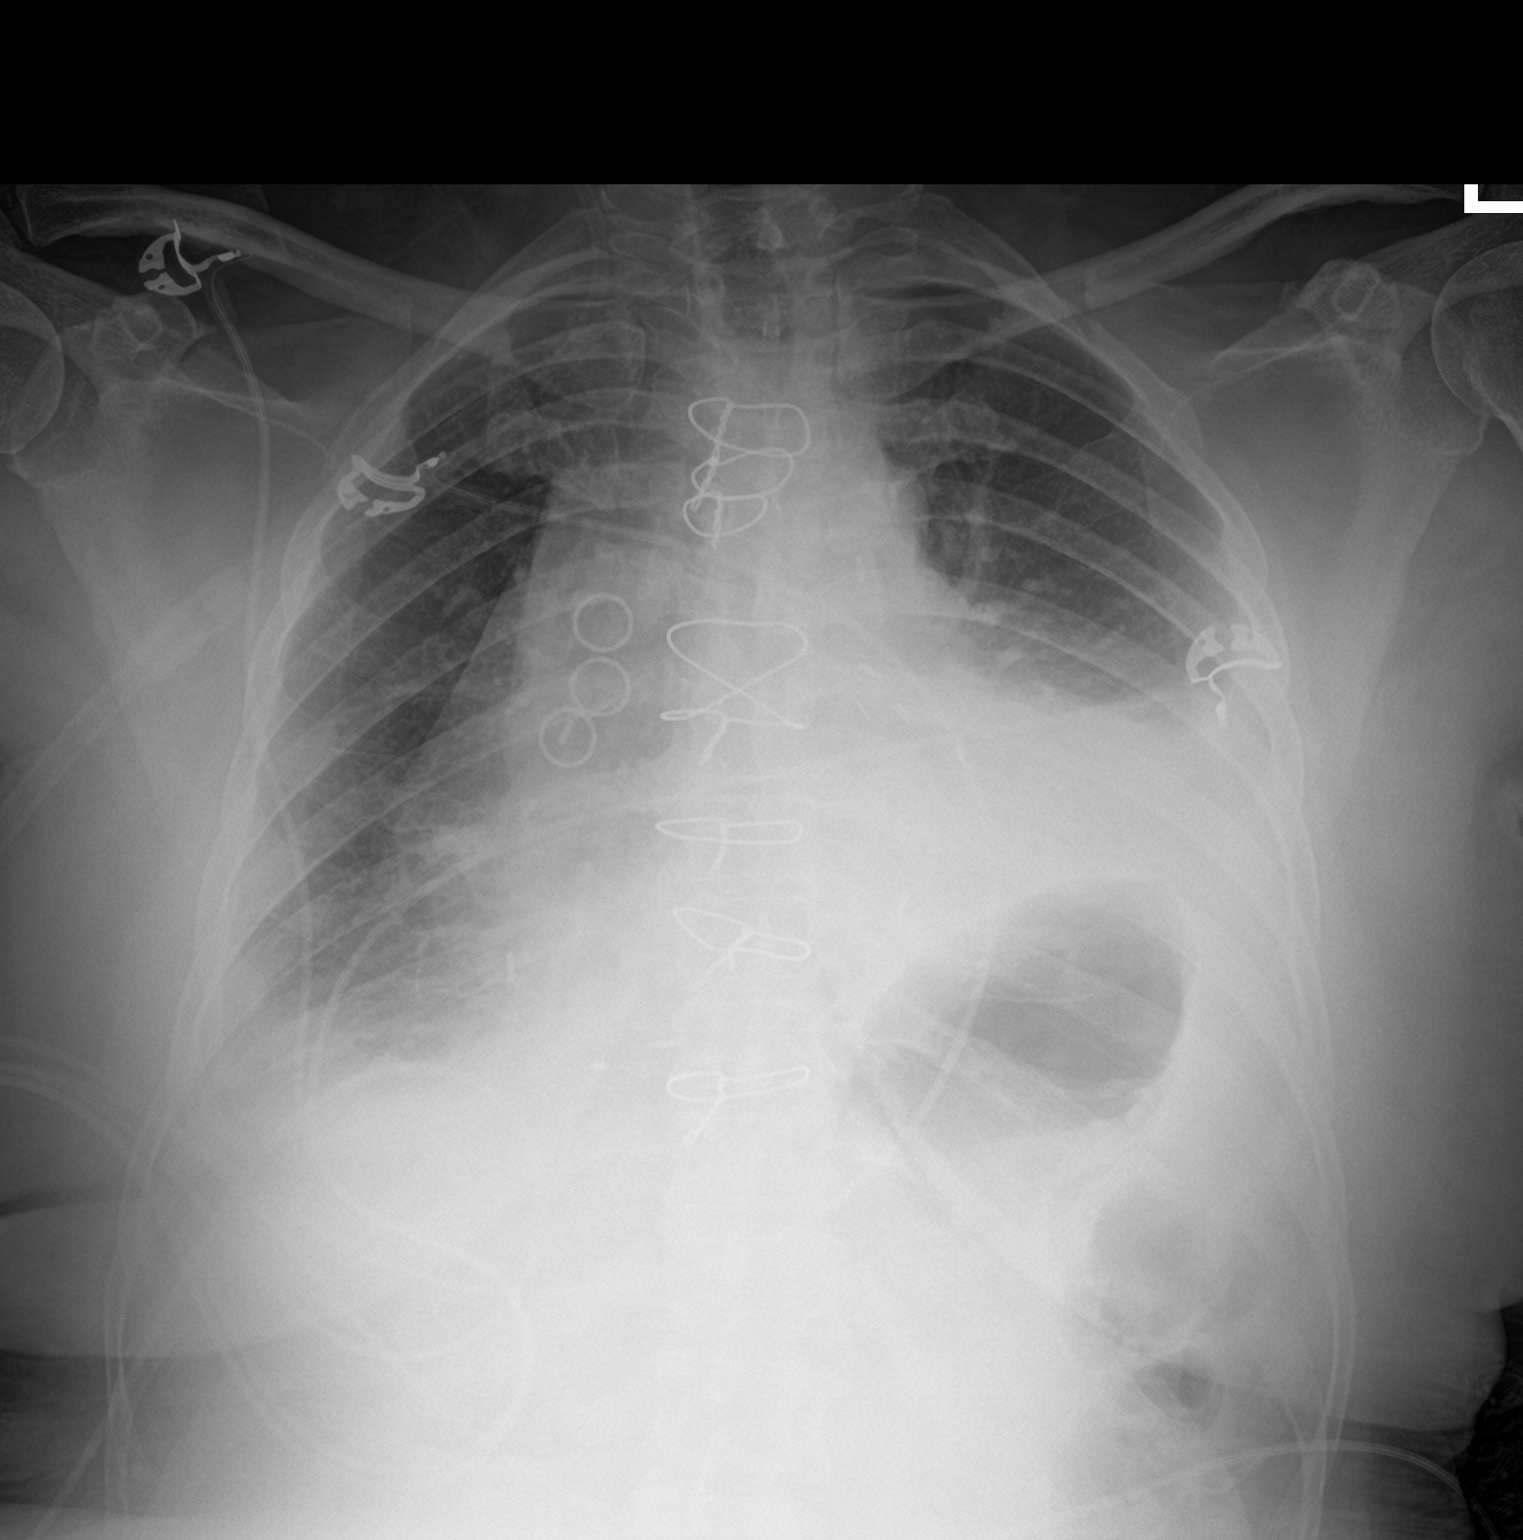

[2 of 2 positions shown; findings below may reference images not displayed]

FINDINGS: The heart is enlarged. Small to moderate bilateral pleural effusions
are unchanged. Postsurgical changes for prior CABG. Interval removal
of the left-sided chest tube. No appreciable pneumothorax.
IMPRESSION: Interval removal of the left-sided chest tube and right IJ access
central line sheath. No appreciable pneumothorax. No other
significant interval change.

## 2023-09-11 IMAGING — US IR THORACENTESIS ASP PLEURAL SPACE W/IMG GUIDE
1 series · 3 of 3 positions shown · non-contrast
Comparison: none

INDICATION: Recent CABG with bilateral pleural effusions,
left-greater-than-right. Request for therapeutic thoracentesis.

[Series 1: ir (person_name)/(person_name) · 3 of 3 slices shown]
[im 1/3]
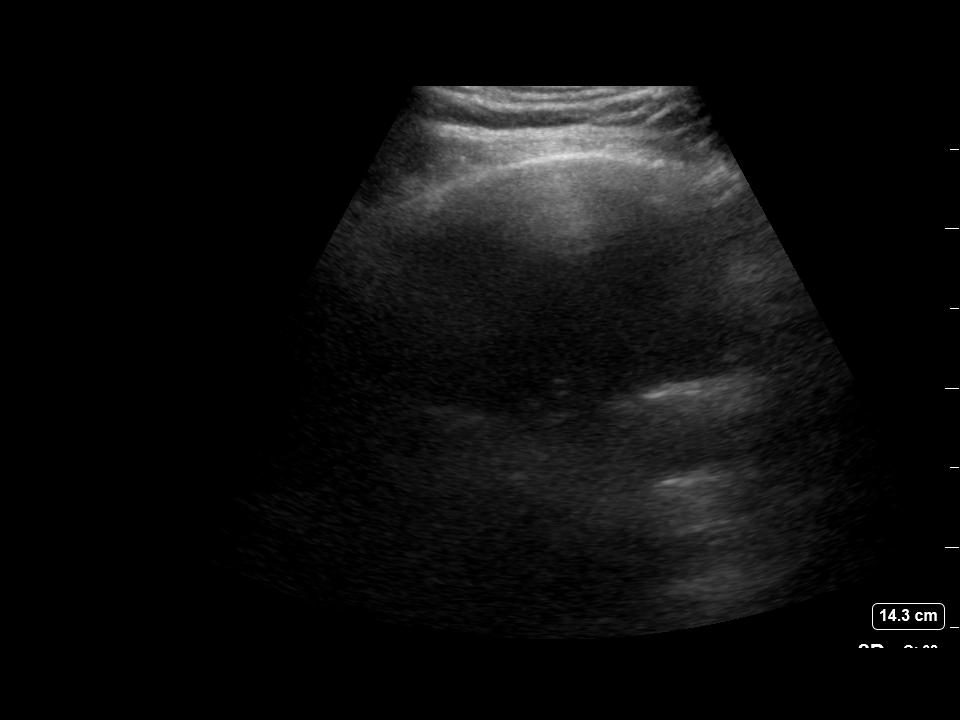
[im 2/3]
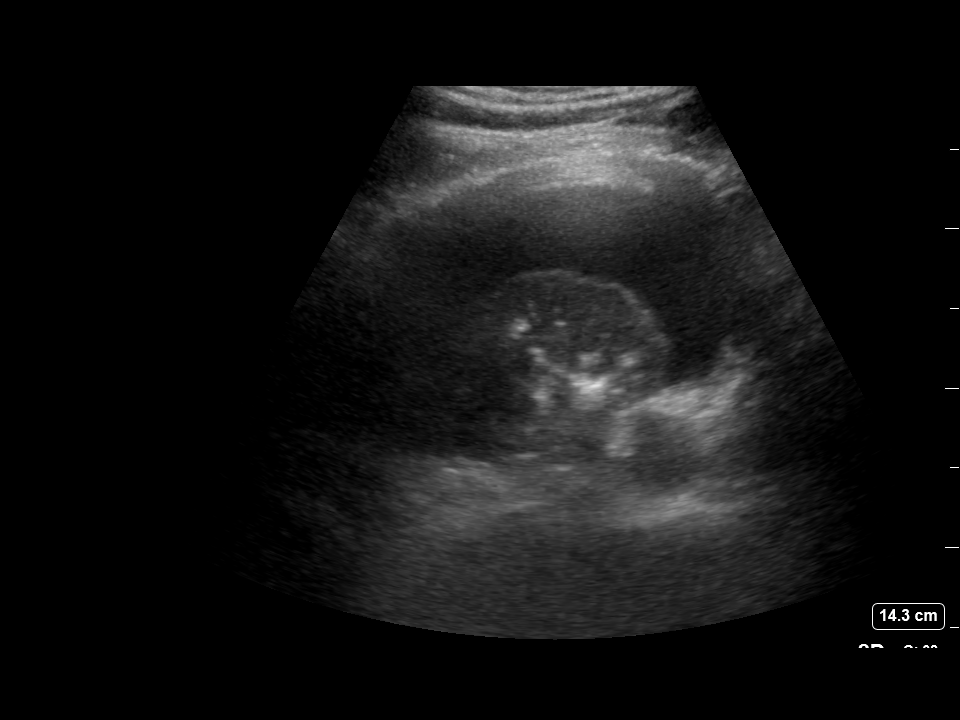
[im 3/3]
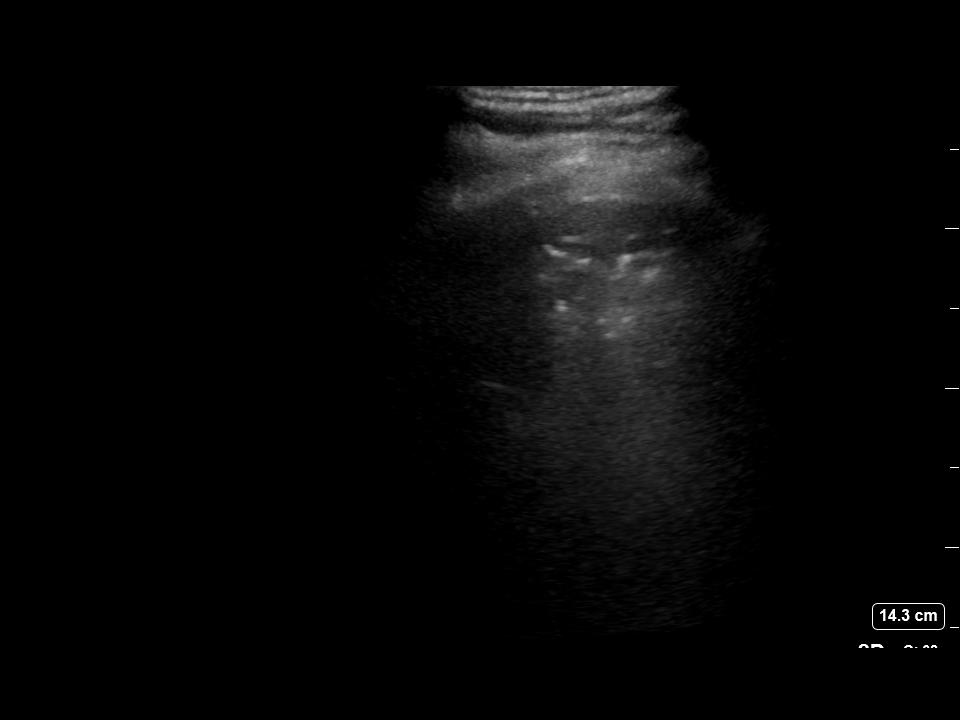

[3 of 3 positions shown; findings below may reference images not displayed]

EXAM:
ULTRASOUND GUIDED LEFT THORACENTESIS

MEDICATIONS:
1% lidocaine 10 mL

COMPLICATIONS:
None immediate.

PROCEDURE:
An ultrasound guided thoracentesis was thoroughly discussed with the
patient and questions answered. The benefits, risks, alternatives
and complications were also discussed. The patient understands and
wishes to proceed with the procedure. Written consent was obtained.

Ultrasound was performed to localize and mark an adequate pocket of
fluid in the left chest. The area was then prepped and draped in the
normal sterile fashion. 1% Lidocaine was used for local anesthesia.
Under ultrasound guidance a 6 Fr Safe-T-Centesis catheter was
introduced. Thoracentesis was performed. The catheter was removed
and a dressing applied.
FINDINGS: A total of approximately 300 mL of red fluid was removed.
IMPRESSION: Successful ultrasound guided left thoracentesis yielding 300 mL of
pleural fluid.

No pneumothorax on post-procedure chest x-ray.

## 2023-09-13 ENCOUNTER — Other Ambulatory Visit: Payer: Self-pay | Admitting: Cardiovascular Disease

## 2023-09-17 ENCOUNTER — Other Ambulatory Visit: Payer: Self-pay | Admitting: Cardiovascular Disease

## 2023-09-21 NOTE — Progress Notes (Signed)
 Cardiology Clinic Note   Date: 09/24/2023 ID: Susan, Weeks [birth year], MRN [id number]  Primary Cardiologist:  Antionette Kirks, MD  Chief Complaint   Susan Weeks is a 73 y.o. female who presents to the clinic today for routine follow up.   Patient Profile   Susan Weeks is followed by Dr. Alvenia Aus for the history outlined below.      Past medical history significant for: CAD. Coronary CTA with FFR 04/01/2021: Calcium  score 52.4 (61st percentile).  Calcified and noncalcified plaque causing severe mid LAD stenosis.  FFR showed significant stenosis (CTO) in mid LAD, significant stenosis in mid RCA. LHC 04/15/2021: Mid LAD 100%.  D1 60%.  OM 360%.  LPAV 100%.  Proximal RCA 90%.  Unsuccessful angioplasty to LAD secondary to unable to cross lesion.  Recommend CTS consult. Echo 04/19/2021: EF 60 to 65%.  No RWMA.  Grade I DD.  Normal RV size/function.  Normal PA pressure.  Mild to moderate MR.  Aortic valve sclerosis without stenosis. CABG x 4 05/07/2021: LIMA to LAD; reverse SVG to PDA, OM1, OM 3. PAF.  Onset postop CABG January 2023. Hypertension. Hyperlipidemia. Lipid panel 09/23/2023: LDL 57, HDL 67, TG 105, total 1.5. OSA. CPAP 100% adherence.  Colon cancer.  In summary, patient was first evaluated by Dr. Alvenia Aus on 03/19/2021 for exertional dyspnea, lower extremity edema, and chest fullness.  Coronary CTA showed severe LAD disease.  Echo showed normal LV/RV function, Grade I DD, mild to moderate MR.  She underwent LHC which showed occluded LAD and severe RCA disease.  Plan for PCI of the LAD and staged PCI to RCA at a later date however LAD lesion could not be crossed therefore patient was referred to CTS for consult.  She underwent CABG x 4 in January 2023.  Postop course complicated by brief episode of A-fib managed with oral amiodarone .  She had a large left pleural effusion in February 2023 requiring Lasix  x 7 days.  She was initially going to have thoracentesis however  ultrasound showed only trace left pleural effusion so procedure was not performed.  Statin therapy was discontinued in March 2023 secondary to elevated LFTs.  Liver lesions seen on prior imaging were extensively worked up and found to be benign.   Patient was last seen in the office by me on 03/31/2023 for routine follow-up.  She reported occasional dependent edema which she managed with compression socks.  She had no other complaints.  No changes were made.     History of Present Illness    Today, patient is doing well. Patient denies shortness of breath, dyspnea on exertion, orthopnea or PND. No chest pain, pressure, or tightness. No palpitations. She reports noticing increased bilateral ankle edema over the last couple of months. She admits to dietary indiscretion with eating salt seasoned nuts over the last month or so. She usually chooses the low salt or unsalted kind. Her BP has been well controlled. She does get higher readings on her home cuff but is unsure of the specific readings. She is very active walking for 1 hour 5 days a week.      ROS: All other systems reviewed and are otherwise negative except as noted in History of Present Illness.  EKGs/Labs Reviewed    EKG Interpretation Date/Time:  Thursday Sep 24 2023 11:22:39 EDT Ventricular Rate:  65 PR Interval:  192 QRS Duration:  86 QT Interval:  400 QTC Calculation: 416 R Axis:   -18  Text Interpretation: Normal  sinus rhythm Anterolateral infarct (cited on or before 08-May-2021) When compared with ECG of 31-Mar-2023 10:48, No significant change was found Confirmed by Morey Ar [phone]) on 09/24/2023 11:33:31 AM   09/23/2023: ALT 42; AST 24; BUN 15; Creatinine, Ser 0.84; Potassium 4.4; Sodium 139   09/23/2023: Hemoglobin 15.9; WBC 3.8   09/23/2023: TSH 1.58    Risk Assessment/Calculations     CHA2DS2-VASc Score = 4   This indicates a 4.8% annual risk of stroke. The patient's score is based upon: CHF History:  0 HTN History: 1 Diabetes History: 0 Stroke History: 0 Vascular Disease History: 1 Age Score: 1 Gender Score: 1             Physical Exam    VS:  BP 118/74 (BP Location: Left Arm, Patient Position: Sitting, Cuff Size: Normal)   Pulse 65   Ht 5\' 3"  (1.6 m)   Wt 188 lb (85.3 kg)   LMP 06/28/1982   SpO2 98%   BMI 33.30 kg/m  , BMI Body mass index is 33.3 kg/m.  GEN: Well nourished, well developed, in no acute distress. Neck: No JVD or carotid bruits. Cardiac:  RRR. No murmurs. No rubs or gallops.   Respiratory:  Respirations regular and unlabored. Clear to auscultation without rales, wheezing or rhonchi. GI: Soft, nontender, nondistended. Extremities: Radials/DP/PT 2+ and equal bilaterally. No clubbing or cyanosis. Mild nonpitting L>R lower extremity edema bilateral ankles.   Skin: Warm and dry, no rash. Neuro: Strength intact.  Assessment & Plan   CAD S/p CABG x 08 May 2021.  Patient denies chest pain, pressure or tightness. She [place] 1 [place] -Continue amlodipine  (see below), metoprolol , aspirin , Repatha .  Lower extremity edema Patient reports noticing an increase in bilateral ankle edema over the last 2 months. She admits to dietary indiscretion eating salty nuts over the last month or so. She usually buys the low salt or unsalted kind. She denies increased DOE, orthopnea or PND. No edema upon awakening and progresses throughout the day. Worse when she sits for long periods of time. Mild, nonpitting edema L>R  bilateral ankles one exam today.  -Decrease amlodipine  to 5 mg daily.  -Utilize compression socks as needed.    PAF Onset postop CABG January 2023. No recurrence. Patient denies palpitations. RRR on exam today. EKG shows NSR.  -Continue to monitor.    Hypertension BP today 118/74. -Continue amlodipine  (see above0, metoprolol .   Hyperlipidemia LDL 57 May 2025, at goal.  Patient not on statin secondary to history of  elevated liver enzymes.  Labs from May 2025 show AST 24, ALT 42, Alk Phos 102. -Continue Repatha .  Disposition: Decrease amlodipine  to 5 mg daily. Return in 6 months or sooner as needed.          Signed, Lonell Rives. Nubia Ziesmer, DNP, NP-C

## 2023-09-23 ENCOUNTER — Encounter: Payer: Self-pay | Admitting: Internal Medicine

## 2023-09-23 ENCOUNTER — Ambulatory Visit (INDEPENDENT_AMBULATORY_CARE_PROVIDER_SITE_OTHER): Payer: BC Managed Care – PPO | Admitting: Internal Medicine

## 2023-09-23 VITALS — BP 118/72 | HR 74 | Temp 98.0°F | Resp 16 | Ht 63.0 in | Wt 190.8 lb

## 2023-09-23 DIAGNOSIS — I10 Essential (primary) hypertension: Secondary | ICD-10-CM

## 2023-09-23 DIAGNOSIS — I25119 Atherosclerotic heart disease of native coronary artery with unspecified angina pectoris: Secondary | ICD-10-CM

## 2023-09-23 DIAGNOSIS — D582 Other hemoglobinopathies: Secondary | ICD-10-CM

## 2023-09-23 DIAGNOSIS — E7800 Pure hypercholesterolemia, unspecified: Secondary | ICD-10-CM

## 2023-09-23 DIAGNOSIS — R739 Hyperglycemia, unspecified: Secondary | ICD-10-CM

## 2023-09-23 DIAGNOSIS — N838 Other noninflammatory disorders of ovary, fallopian tube and broad ligament: Secondary | ICD-10-CM

## 2023-09-23 DIAGNOSIS — Z85038 Personal history of other malignant neoplasm of large intestine: Secondary | ICD-10-CM

## 2023-09-23 DIAGNOSIS — M7989 Other specified soft tissue disorders: Secondary | ICD-10-CM | POA: Insufficient documentation

## 2023-09-23 DIAGNOSIS — R945 Abnormal results of liver function studies: Secondary | ICD-10-CM

## 2023-09-23 LAB — CBC WITH DIFFERENTIAL/PLATELET
Basophils Absolute: 0 10*3/uL (ref 0.0–0.1)
Basophils Relative: 0.9 % (ref 0.0–3.0)
Eosinophils Absolute: 0.1 10*3/uL (ref 0.0–0.7)
Eosinophils Relative: 2.4 % (ref 0.0–5.0)
HCT: 47.8 % — ABNORMAL HIGH (ref 36.0–46.0)
Hemoglobin: 15.9 g/dL — ABNORMAL HIGH (ref 12.0–15.0)
Lymphocytes Relative: 40.5 % (ref 12.0–46.0)
Lymphs Abs: 1.5 10*3/uL (ref 0.7–4.0)
MCHC: 33.2 g/dL (ref 30.0–36.0)
MCV: 88.2 fl (ref 78.0–100.0)
Monocytes Absolute: 0.3 10*3/uL (ref 0.1–1.0)
Monocytes Relative: 7.9 % (ref 3.0–12.0)
Neutro Abs: 1.8 10*3/uL (ref 1.4–7.7)
Neutrophils Relative %: 48.3 % (ref 43.0–77.0)
Platelets: 218 10*3/uL (ref 150.0–400.0)
RBC: 5.42 Mil/uL — ABNORMAL HIGH (ref 3.87–5.11)
RDW: 13.6 % (ref 11.5–15.5)
WBC: 3.8 10*3/uL — ABNORMAL LOW (ref 4.0–10.5)

## 2023-09-23 LAB — BASIC METABOLIC PANEL WITH GFR
BUN: 15 mg/dL (ref 6–23)
CO2: 29 meq/L (ref 19–32)
Calcium: 9.5 mg/dL (ref 8.4–10.5)
Chloride: 103 meq/L (ref 96–112)
Creatinine, Ser: 0.84 mg/dL (ref 0.40–1.20)
GFR: 68.72 mL/min (ref >60.00)
Glucose, Bld: 101 mg/dL — ABNORMAL HIGH (ref 70–99)
Potassium: 4.4 meq/L (ref 3.5–5.1)
Sodium: 139 meq/L (ref 135–145)

## 2023-09-23 LAB — LIPID PANEL
Cholesterol: 145 mg/dL (ref 0–200)
HDL: 67 mg/dL (ref >39.00)
LDL Cholesterol: 57 mg/dL (ref 0–99)
NonHDL: 78.26
Total CHOL/HDL Ratio: 2
Triglycerides: 105 mg/dL (ref 0.0–149.0)
VLDL: 21 mg/dL (ref 0.0–40.0)

## 2023-09-23 LAB — HEPATIC FUNCTION PANEL
ALT: 42 U/L — ABNORMAL HIGH (ref 0–35)
AST: 24 U/L (ref 0–37)
Albumin: 4.5 g/dL (ref 3.5–5.2)
Alkaline Phosphatase: 102 U/L (ref 39–117)
Bilirubin, Direct: 0.2 mg/dL (ref 0.0–0.3)
Total Bilirubin: 0.8 mg/dL (ref 0.2–1.2)
Total Protein: 7.3 g/dL (ref 6.0–8.3)

## 2023-09-23 LAB — TSH: TSH: 1.58 u[IU]/mL (ref 0.35–5.50)

## 2023-09-23 LAB — HEMOGLOBIN A1C: Hgb A1c MFr Bld: 5.9 % (ref 4.6–6.5)

## 2023-09-23 NOTE — Assessment & Plan Note (Signed)
 Recently evaluated by GI - f/u elevated liver enzymes and recommended f/u colonoscopy.  Colonoscopy 06/2022 - unremarkable.  Recommended f/u in 5 years. Saw Dr Cathie Hoops 01/2022 - ordered CT chest/abd/pelvis.  F/u colonoscopy 06/2022 - recommended f/u colonoscopy in 5 years.

## 2023-09-23 NOTE — Assessment & Plan Note (Signed)
 Low-carb diet and exercise.  Follow met b and A1c.

## 2023-09-23 NOTE — Assessment & Plan Note (Signed)
 On Repatha .  Low cholesterol diet and exercise.  Check lipid panel today.  Lab Results  Component Value Date   CHOL 149 05/22/2023   HDL 67.10 05/22/2023   LDLCALC 67 05/22/2023   LDLDIRECT 53.0 07/18/2021   TRIG 77.0 05/22/2023   CHOLHDL 2 05/22/2023

## 2023-09-23 NOTE — Assessment & Plan Note (Signed)
 Hematology -  follows for ovarian mass.  Had f/u MRI 10/06/22 - Intrinsically T2 hypointense mass of the right ovary with ow-level homogeneous internal contrast enhancement is unchanged, measuring 3.2 x 2.3 cm. 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 No activity on previous PET.

## 2023-09-23 NOTE — Assessment & Plan Note (Signed)
 Swelling as outlined. Noticed over the last several weeks. No sob. No chest pain. Discussed monitoring sodium intake. Discussed possible side effect of amlodipine . Has been on this medication for years. Weight stable - she has been weighing daily as outlined.  Swelling worse in evening, better in am. Will have her start wearing compression hose - on in the morning and off in the evening. Check labs, including metabolic panel, liver panel, cbc and tsh. Hold on changing medication. Follow pressures.  Has appt with cardiology tomorrow.

## 2023-09-23 NOTE — Assessment & Plan Note (Addendum)
 Followed by hematology as outlined.  Continue cpap. Compliant with cpap.

## 2023-09-23 NOTE — Assessment & Plan Note (Signed)
 Doing well on amlodipine , metoprolol .  Off statin due to liver enzyme elevation. No chest pain. No sob. Appears to be stable. Continue risk factor modification. Continue repatha .

## 2023-09-23 NOTE — Progress Notes (Signed)
 Subjective:    [name]: Susan Weeks, female    DOB: [birth year], 72 y.o.   MRN: [id number]  Patient here for  Chief Complaint  Patient presents with   Medical Management of Chronic Issues    HPI Here for a scheduled follow up - f/u regarding hypertension, CAD and hypercholesterolemia. Had f/u with Dr Wilhelmenia Harada - 04/07/23 - f/u liver lesion - extensive w/up -  No hypermetabolism on PET, Enhanced US  showed likely benign hemangioma and biopsy was not recommended.  10/24/2021 enhanced US  showed right hepatic dome solid mass is smaller comparing to prior, similar contrast-enhancement characteristics.  Favor benign etiology.  Left hepatic mass appears similar.  Plan to repeat enhanced ultrasound. Also f/u regarding secondary erythrocytosis secondary to sleep apnea.  Compliant with cpap. Saw pulmonary 07/08/23 - f/u sleep apnea. Reports she has noticed over the last several weeks some increased lower extremity swelling. She weighs daily. Weights have been stable at home - 185-187. She has been walking. Denies chest pain or sob. Discussed increased salt intake. Discussed to avoid increased sodium intake. Reports that leg swelling is better in am and worse by evening. Mostly notices ankle swelling. Swelling does not extend up her leg. Leg leg has been larger than right leg for years.    Past Medical History:  Diagnosis Date   Abnormal liver function    Colon cancer (HCC)    adenocarcinoma of the cecum s/p partial colon resection   Coronary artery disease    a. 03/2021 Cor CTA: sev LAD dzs; b. 04/2021 Cath: LM nl, LAD 125m (unable to wire), D1 60, LCX nl, OM3 60, LPAV 100, RCA 90p; c. 05/2021 CABG x 4: LIMA->LAD, VG->RPDA, VG->OM1, VG->OM3.   History of colon polyps    Hyperlipidemia    Hypertension    Post-operative Atrial Fibrillation (HCC)    a. 05/2021 Brief PAF after CABG-->amio.   Sleep apnea 12/28/2017   Systolic murmur    a. 04/2021 Echo: EF 60-65%, no rwma, GrI DD, RVSP 28.20mmHg. Mild-mod MR.    Past Surgical History:  Procedure Laterality Date   ABDOMINAL HYSTERECTOMY  1984   secondary to bleeding and fibroid tumors   APPENDECTOMY     COLON SURGERY     COLONOSCOPY W/ POLYPECTOMY  2001   COLONOSCOPY WITH PROPOFOL  N/A 07/28/2016   Procedure: COLONOSCOPY WITH PROPOFOL ;  Surgeon: Deveron Fly, MD;  Location: [facility] ENDOSCOPY;  Service: Endoscopy;  Laterality: N/A;   COLONOSCOPY WITH PROPOFOL  N/A 06/30/2022   Procedure: COLONOSCOPY WITH PROPOFOL ;  Surgeon: Shane Darling, MD;  Location: [facility] ENDOSCOPY;  Service: Endoscopy;  Laterality: N/A;   CORONARY ANGIOPLASTY     CORONARY ARTERY BYPASS GRAFT N/A 05/07/2021   Procedure: CORONARY ARTERY BYPASS GRAFTING (CABG) X 4, USING LEFT INTERNAL MAMMARY ARTERY, LEFT LEG GREATER SAPHENOUS VEIN HARVESTED ENDOSCOPICALLY;  Surgeon: Hilarie Lovely, MD;  Location: [facility] OR;  Service: Open Heart Surgery;  Laterality: N/A;   CORONARY STENT INTERVENTION N/A 04/15/2021   Procedure: CORONARY STENT INTERVENTION;  Surgeon: [name] Hamilton, MD;  Location: [facility] INVASIVE CV LAB;  Service: Cardiovascular;  Laterality: N/A;   DILATION AND CURETTAGE OF UTERUS  1967   ENDOVEIN HARVEST OF GREATER SAPHENOUS VEIN Left 05/07/2021   Procedure: ENDOVEIN HARVEST OF GREATER SAPHENOUS VEIN;  Surgeon: Hilarie Lovely, MD;  Location: [facility] OR;  Service: Open Heart Surgery;  Laterality: Left;   IR THORACENTESIS ASP PLEURAL SPACE W/IMG GUIDE  05/10/2021   LEFT HEART CATH AND CORONARY ANGIOGRAPHY N/A  04/15/2021   Procedure: LEFT HEART CATH AND CORONARY ANGIOGRAPHY;  Surgeon: [name] Hamilton, MD;  Location: [facility] INVASIVE CV LAB;  Service: Cardiovascular;  Laterality: N/A;   TEE WITHOUT CARDIOVERSION N/A 05/07/2021   Procedure: TRANSESOPHAGEAL ECHOCARDIOGRAM (TEE);  Surgeon: Hilarie Lovely, MD;  Location: [facility] OR;  Service: Open Heart Surgery;  Laterality: N/A;   Family History  Problem Relation Age of Onset   Stroke Mother    Hypertension Mother     Cancer Father        Prostate    Hypertension Sister        x4   Heart attack Brother    Heart disease Brother        myocardial infarction   Hypertension Brother    Breast cancer Neg Hx    Social History   Socioeconomic History   Marital status: Married    Spouse name: Eddie   Number of children: 1   Years of education: HS   Highest education level: Not on file  Occupational History    Employer: OTHER    Comment: [employer]  Tobacco Use   Smoking status: Former    Current packs/day: 0.00    Types: Cigarettes    Start date: 1968    Quit date: 1978    Years since quitting: 47.4   Smokeless tobacco: Never   Tobacco comments:    1 pack per week--only smoked on the weekends.   Vaping Use   Vaping status: Never Used  Substance and Sexual Activity   Alcohol use: No    Alcohol/week: 0.0 standard drinks of alcohol   Drug use: No   Sexual activity: Not on file  Other Topics Concern   Not on file  Social History Narrative   Not on file   Social Drivers of Health   Financial Resource Strain: Not on file  Food Insecurity: Not on file  Transportation Needs: Not on file  Physical Activity: Not on file  Stress: Not on file  Social Connections: Not on file     Review of Systems  Constitutional:  Negative for appetite change and unexpected weight change.  HENT:  Negative for congestion and sinus pressure.   Respiratory:  Negative for cough, chest tightness and shortness of breath.   Cardiovascular:  Positive for leg swelling. Negative for chest pain and palpitations.  Gastrointestinal:  Negative for abdominal pain, diarrhea, nausea and vomiting.  Genitourinary:  Negative for difficulty urinating and dysuria.  Musculoskeletal:  Negative for joint swelling and myalgias.  Skin:  Negative for color change and rash.  Neurological:  Negative for dizziness and headaches.  Psychiatric/Behavioral:  Negative for agitation and dysphoric mood.        Objective:     BP  118/72   Pulse 74   Temp 98 F (36.7 C)   Resp 16   Ht 5\' 3"  (1.6 m)   Wt 190 lb 12.8 oz (86.5 kg)   LMP 06/28/1982   SpO2 99%   BMI 33.80 kg/m  Wt Readings from Last 3 Encounters:  09/23/23 190 lb 12.8 oz (86.5 kg)  07/08/23 187 lb 9.6 oz (85.1 kg)  05/22/23 185 lb (83.9 kg)    Physical Exam Vitals reviewed.  Constitutional:      General: She is not in acute distress.    Appearance: Normal appearance.  HENT:     Head: Normocephalic and atraumatic.     Right Ear: External ear normal.     Left Ear: External  ear normal.     Mouth/Throat:     Pharynx: No oropharyngeal exudate or posterior oropharyngeal erythema.  Eyes:     General: No scleral icterus.       Right eye: No discharge.        Left eye: No discharge.     Conjunctiva/sclera: Conjunctivae normal.  Neck:     Thyroid : No thyromegaly.  Cardiovascular:     Rate and Rhythm: Normal rate and regular rhythm.     Comments: 1/6 systolic murmur Pulmonary:     Effort: No respiratory distress.     Breath sounds: Normal breath sounds. No wheezing.  Abdominal:     General: Bowel sounds are normal.     Palpations: Abdomen is soft.     Tenderness: There is no abdominal tenderness.  Musculoskeletal:        General: No tenderness.     Cervical back: Neck supple. No tenderness.     Comments: Pedal and ankle swelling - lower extremity swelling. Left lower extremity larger than right - chronic. No increased erythema or tenderness.  DP pulses palpable and equal bilaterally.   Lymphadenopathy:     Cervical: No cervical adenopathy.  Skin:    Findings: No erythema or rash.  Neurological:     Mental Status: She is alert.  Psychiatric:        Mood and Affect: Mood normal.        Behavior: Behavior normal.         Outpatient Encounter Medications as of 09/23/2023  Medication Sig   acetaminophen  (TYLENOL ) 500 MG tablet Take 500 mg by mouth every 6 (six) hours as needed for moderate pain.   amLODipine  (NORVASC ) 10 MG tablet  TAKE 1 TABLET BY MOUTH EVERY DAY   aspirin  EC 325 MG EC tablet Take 1 tablet (325 mg total) by mouth daily.   Calcium  Carbonate-Vitamin D (CALCIUM  600+D PO) Take 1 tablet by mouth daily.   Evolocumab  (REPATHA  SURECLICK) 140 MG/ML SOAJ Inject 140 mg into the skin every 14 (fourteen) days.   metoprolol  tartrate (LOPRESSOR ) 25 MG tablet TAKE 1 TABLET BY MOUTH TWICE A DAY   mupirocin  ointment (BACTROBAN ) 2 % Apply 1 Application topically 2 (two) times daily.   NON FORMULARY Pt uses a cpap nightly   vitamin E 400 UNIT capsule Take 400 Units by mouth daily.    No facility-administered encounter medications on file as of 09/23/2023.     Lab Results  Component Value Date   WBC 4.6 03/30/2023   HGB 16.1 (H) 03/30/2023   HCT 46.1 (H) 03/30/2023   PLT 238 03/30/2023   GLUCOSE 88 05/22/2023   CHOL 149 05/22/2023   TRIG 77.0 05/22/2023   HDL 67.10 05/22/2023   LDLDIRECT 53.0 07/18/2021   LDLCALC 67 05/22/2023   ALT 50 (H) 05/22/2023   AST 30 05/22/2023   NA 139 05/22/2023   K 3.8 05/22/2023   CL 103 05/22/2023   CREATININE 0.91 05/22/2023   BUN 17 05/22/2023   CO2 28 05/22/2023   TSH 0.89 01/08/2023   INR 1.0 11/15/2021   HGBA1C 6.0 05/22/2023    MM 3D SCREENING MAMMOGRAM BILATERAL BREAST Result Date: 03/19/2023 CLINICAL DATA:  Screening. EXAM: DIGITAL SCREENING BILATERAL MAMMOGRAM WITH TOMOSYNTHESIS AND CAD TECHNIQUE: Bilateral screening digital craniocaudal and mediolateral oblique mammograms were obtained. Bilateral screening digital breast tomosynthesis was performed. The images were evaluated with computer-aided detection. COMPARISON:  Previous exam(s). ACR Breast Density Category b: There are scattered areas of fibroglandular density. FINDINGS:  There are no findings suspicious for malignancy. IMPRESSION: No mammographic evidence of malignancy. A result letter of this screening mammogram will be mailed directly to the patient. RECOMMENDATION: Screening mammogram in one year.  (Code:SM-B-01Y) BI-RADS CATEGORY  1: Negative. Electronically Signed   By: Amanda Jungling M.D.   On: 03/19/2023 16:20       Assessment & Plan:  Swelling of lower extremity Assessment & Plan: Swelling as outlined. Noticed over the last several weeks. No sob. No chest pain. Discussed monitoring sodium intake. Discussed possible side effect of amlodipine . Has been on this medication for years. Weight stable - she has been weighing daily as outlined.  Swelling worse in evening, better in am. Will have her start wearing compression hose - on in the morning and off in the evening. Check labs, including metabolic panel, liver panel, cbc and tsh. Hold on changing medication. Follow pressures.  Has appt with cardiology tomorrow.   Orders: -     CBC with Differential/Platelet -     TSH  Pure hypercholesterolemia Assessment & Plan: On Repatha .  Low cholesterol diet and exercise.  Check lipid panel today.  Lab Results  Component Value Date   CHOL 149 05/22/2023   HDL 67.10 05/22/2023   LDLCALC 67 05/22/2023   LDLDIRECT 53.0 07/18/2021   TRIG 77.0 05/22/2023   CHOLHDL 2 05/22/2023    Orders: -     Lipid panel -     Hepatic function panel -     CBC with Differential/Platelet -     TSH  Essential hypertension, benign Assessment & Plan: Blood pressure has been doing well on current regimen. She is having some leg swelling. Discussed possible side effect of amlodipine . Has been on this medication for years. Had swelling with lisinopril . (Avoiding ace inhibitor and ARB given previous presumed allergic reaction). No sob. Will continue amlodipine  for now. Check routine labs, including metabolic panel, cbc, liver panel and tsh.   Orders: -     Basic metabolic panel with GFR  Hyperglycemia Assessment & Plan: Low carb diet and exercise. Follow met b and A1c.   Orders: -     Hemoglobin A1c  Abnormal liver function Assessment & Plan: Off statin due to elevated liver function tests.  Being  evaluated by oncology.  Dr Wilhelmenia Harada - ordered f/u abdominal ultrasound - liver mass unchanged/smaller - favored benign lesion.  F/u Dr Wilhelmenia Harada - f/u enhanced ultrasound recommended one year.  F/u ultrasound - Continued slight decreased size of previously visualized right hepatic dome hyperechoic mass with contrast enhancement characteristics which again favored benign etiology such as hemangioma. Previously visualized left lobe hepatic mass is no longer conspicuous. Follow liver function tests. Check liver panel today.    Coronary artery disease involving native coronary artery of native heart with angina pectoris [facility]) Assessment & Plan: Doing well on amlodipine , metoprolol .  Off statin due to liver enzyme elevation. No chest pain. No sob. Appears to be stable. Continue risk factor modification. Continue repatha .    Elevated hemoglobin (HCC) Assessment & Plan: Followed by hematology as outlined.  Continue cpap. Compliant with cpap.    History of colon cancer Assessment & Plan: Recently evaluated by GI - f/u elevated liver enzymes and recommended f/u colonoscopy.  Colonoscopy 06/2022 - unremarkable.  Recommended f/u in 5 years. Saw Dr Wilhelmenia Harada 01/2022 - ordered CT chest/abd/pelvis.  F/u colonoscopy 06/2022 - recommended f/u colonoscopy in 5 years.      Ovarian mass Assessment & Plan: Hematology -  follows for  ovarian mass.  Had f/u MRI 10/06/22 - Intrinsically T2 hypointense mass of the right ovary with ow-level homogeneous internal contrast enhancement is unchanged, measuring 3.2 x 2.3 cm. 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 No activity on previous PET.       Dellar Fenton, MD

## 2023-09-23 NOTE — Assessment & Plan Note (Signed)
 Blood pressure has been doing well on current regimen. She is having some leg swelling. Discussed possible side effect of amlodipine . Has been on this medication for years. Had swelling with lisinopril . (Avoiding ace inhibitor and ARB given previous presumed allergic reaction). No sob. Will continue amlodipine  for now. Check routine labs, including metabolic panel, cbc, liver panel and tsh.

## 2023-09-23 NOTE — Assessment & Plan Note (Signed)
 Off statin due to elevated liver function tests.  Being evaluated by oncology.  Dr Wilhelmenia Harada - ordered f/u abdominal ultrasound - liver mass unchanged/smaller - favored benign lesion.  F/u Dr Wilhelmenia Harada - f/u enhanced ultrasound recommended one year.  F/u ultrasound - Continued slight decreased size of previously visualized right hepatic dome hyperechoic mass with contrast enhancement characteristics which again favored benign etiology such as hemangioma. Previously visualized left lobe hepatic mass is no longer conspicuous. Follow liver function tests. Check liver panel today.

## 2023-09-24 ENCOUNTER — Encounter: Payer: Self-pay | Admitting: Student

## 2023-09-24 ENCOUNTER — Ambulatory Visit: Attending: Student | Admitting: Student

## 2023-09-24 VITALS — BP 118/74 | HR 65 | Ht 63.0 in | Wt 188.0 lb

## 2023-09-24 DIAGNOSIS — R600 Localized edema: Secondary | ICD-10-CM

## 2023-09-24 DIAGNOSIS — E785 Hyperlipidemia, unspecified: Secondary | ICD-10-CM | POA: Diagnosis not present

## 2023-09-24 DIAGNOSIS — I25810 Atherosclerosis of coronary artery bypass graft(s) without angina pectoris: Secondary | ICD-10-CM | POA: Diagnosis not present

## 2023-09-24 DIAGNOSIS — I10 Essential (primary) hypertension: Secondary | ICD-10-CM | POA: Diagnosis not present

## 2023-09-24 DIAGNOSIS — I480 Paroxysmal atrial fibrillation: Secondary | ICD-10-CM | POA: Diagnosis not present

## 2023-09-24 MED ORDER — AMLODIPINE BESYLATE 5 MG PO TABS: 5.0000 mg | ORAL_TABLET | Freq: Every day | ORAL | 3 refills | Status: AC

## 2023-09-24 NOTE — Patient Instructions (Signed)
 Medication Instructions:  Your physician has recommended you make the following change in your medication:   DECREASE Amlodipine  to 5 mg once daily   *If you need a refill on your cardiac medications before your next appointment, please call your pharmacy*  Lab Work: None  If you have labs (blood work) drawn today and your tests are completely normal, you will receive your results only by: MyChart Message (if you have MyChart) OR A paper copy in the mail If you have any lab test that is abnormal or we need to change your treatment, we will call you to review the results.  Testing/Procedures: None  Follow-Up: At [facility], you and your health needs are our priority.  As part of our continuing mission to provide you with exceptional heart care, our providers are all part of one team.  This team includes your primary Cardiologist (physician) and Advanced Practice Providers or APPs (Physician Assistants and Nurse Practitioners) who all work together to provide you with the care you need, when you need it.  Your next appointment:   6 month(s)  Provider:   Antionette Kirks, MD or Morey Ar, NP

## 2023-09-29 ENCOUNTER — Ambulatory Visit: Admitting: Student

## 2023-09-30 ENCOUNTER — Ambulatory Visit: Payer: Self-pay | Admitting: Internal Medicine

## 2023-10-01 NOTE — Telephone Encounter (Signed)
 Copied from CRM [id number]. Topic: Clinical - Lab/Test Results >> Oct 01, 2023 10:57 AM Adonis Hoot wrote: Reason for CRM: Patient returned call to [facility] regarding lab results.Message from provider was relayed to patient. Patient verbalized understanding.

## 2023-10-13 ENCOUNTER — Inpatient Hospital Stay: Payer: BC Managed Care – PPO | Attending: Oncology

## 2023-10-13 DIAGNOSIS — D751 Secondary polycythemia: Secondary | ICD-10-CM | POA: Insufficient documentation

## 2023-10-13 DIAGNOSIS — Z85038 Personal history of other malignant neoplasm of large intestine: Secondary | ICD-10-CM | POA: Diagnosis not present

## 2023-10-13 DIAGNOSIS — R970 Elevated carcinoembryonic antigen [CEA]: Secondary | ICD-10-CM | POA: Diagnosis not present

## 2023-10-13 DIAGNOSIS — K769 Liver disease, unspecified: Secondary | ICD-10-CM | POA: Insufficient documentation

## 2023-10-13 DIAGNOSIS — Z87891 Personal history of nicotine dependence: Secondary | ICD-10-CM | POA: Insufficient documentation

## 2023-10-13 LAB — CBC WITH DIFFERENTIAL (CANCER CENTER ONLY)
Abs Immature Granulocytes: 0.01 10*3/uL (ref 0.00–0.07)
Basophils Absolute: 0 10*3/uL (ref 0.0–0.1)
Basophils Relative: 1 %
Eosinophils Absolute: 0.1 10*3/uL (ref 0.0–0.5)
Eosinophils Relative: 2 %
HCT: 46.8 % — ABNORMAL HIGH (ref 36.0–46.0)
Hemoglobin: 15.6 g/dL — ABNORMAL HIGH (ref 12.0–15.0)
Immature Granulocytes: 0 %
Lymphocytes Relative: 35 %
Lymphs Abs: 1.3 10*3/uL (ref 0.7–4.0)
MCH: 29.3 pg (ref 26.0–34.0)
MCHC: 33.3 g/dL (ref 30.0–36.0)
MCV: 87.8 fL (ref 80.0–100.0)
Monocytes Absolute: 0.3 10*3/uL (ref 0.1–1.0)
Monocytes Relative: 7 %
Neutro Abs: 2.1 10*3/uL (ref 1.7–7.7)
Neutrophils Relative %: 55 %
Platelet Count: 209 10*3/uL (ref 150–400)
RBC: 5.33 MIL/uL — ABNORMAL HIGH (ref 3.87–5.11)
RDW: 12.6 % (ref 11.5–15.5)
WBC Count: 3.8 10*3/uL — ABNORMAL LOW (ref 4.0–10.5)
nRBC: 0 % (ref 0.0–0.2)

## 2023-10-13 LAB — CMP (CANCER CENTER ONLY)
ALT: 64 U/L — ABNORMAL HIGH (ref 0–44)
AST: 56 U/L — ABNORMAL HIGH (ref 15–41)
Albumin: 4 g/dL (ref 3.5–5.0)
Alkaline Phosphatase: 101 U/L (ref 38–126)
Anion gap: 7 (ref 5–15)
BUN: 20 mg/dL (ref 8–23)
CO2: 26 mmol/L (ref 22–32)
Calcium: 8.9 mg/dL (ref 8.9–10.3)
Chloride: 105 mmol/L (ref 98–111)
Creatinine: 1.04 mg/dL — ABNORMAL HIGH (ref 0.44–1.00)
GFR, Estimated: 57 mL/min — ABNORMAL LOW (ref >60)
Glucose, Bld: 99 mg/dL (ref 70–99)
Potassium: 4.3 mmol/L (ref 3.5–5.1)
Sodium: 138 mmol/L (ref 135–145)
Total Bilirubin: 0.9 mg/dL (ref 0.0–1.2)
Total Protein: 6.9 g/dL (ref 6.5–8.1)

## 2023-10-14 LAB — CEA: CEA: 5.3 ng/mL — ABNORMAL HIGH (ref 0.0–4.7)

## 2023-10-15 ENCOUNTER — Encounter: Payer: Self-pay | Admitting: Oncology

## 2023-10-15 ENCOUNTER — Inpatient Hospital Stay: Payer: BC Managed Care – PPO | Admitting: Oncology

## 2023-10-15 VITALS — BP 151/75 | HR 71 | Temp 96.0°F | Resp 18 | Wt 188.1 lb

## 2023-10-15 DIAGNOSIS — D751 Secondary polycythemia: Secondary | ICD-10-CM

## 2023-10-15 DIAGNOSIS — K769 Liver disease, unspecified: Secondary | ICD-10-CM

## 2023-10-15 DIAGNOSIS — Z85038 Personal history of other malignant neoplasm of large intestine: Secondary | ICD-10-CM

## 2023-10-15 NOTE — Progress Notes (Signed)
 Hematology/Oncology Progress note [phone] [phone]      Patient Care Team: Dellar Fenton, MD as PCP - General (Internal Medicine) [name] Hamilton, MD as PCP - Cardiology (Cardiology) Timmy Forbes, MD as Consulting Physician (Oncology)  REASON FOR VISIT Follow up for erythrocytosis, liver lesion and adnexal lesion  ASSESSMENT & PLAN:   Liver lesion # liver lesions she has had extensive work up including No hypermetabolism on PET, Enhanced US  showed likely benign hemangioma and biopsy was not proceeded. 10/30/2022 contrast enhanced US  showed  1. Continued slight decreased size of previously visualized right hepatic dome hyperechoic mass with contrast enhancement characteristics which again favored benign etiology such as hemangioma. 2. Previously visualized left lobe hepatic mass is no longer conspicuous  Plan to repeat enhanced ultrasound in 6 months - 1 year interval  Erythrocytosis #Secondary erythrocytosis secondary to sleep apnea Previous work up JAK2 V617F mutation negative, with reflex to other mutations CALR, MPL, JAK 2 Ex 12-15 mutations negative. BCR ABL mutation negative She is compliant with CPAP machine.  No need for phlebotomy at this point.  Continue observation.  History of colon cancer #History of colon cancer, chronically elevated CEA,  October 2023 CT chest abdomen pelvis showed no evidence of cancer recurrence. 06/30/2022 colonoscopy no recurrence.   Orders Placed This Encounter  Procedures   CMP (Cancer Center only)    Standing Status:   Future    Expected Date:   10/14/2024    Expiration Date:   10/14/2024   CBC with Differential (Cancer Center Only)    Standing Status:   Future    Expected Date:   10/14/2024    Expiration Date:   10/14/2024   CEA    Standing Status:   Future    Expected Date:   10/14/2024    Expiration Date:   10/14/2024   Follow-up in 6 months. All questions were answered. The patient knows to call the  clinic with any problems, questions or concerns.  Timmy Forbes, MD, PhD [facility] Hematology Oncology 10/15/2023    HISTORY OF PRESENTING ILLNESS:  Susan Weeks is a  73 y.o.  female with PMH listed below who was referred to me for evaluation of elevated hemoglobin.  Recent hemoglobin 16.2, hct 47.7, reviewed her previous labs, hemoglobin has been chronically high since 2017, ranging from 15.1 to 16.2.   Pertinent Oncology Histroy  Previous history of carcinoma of colon status post resection, has an intermittent rising CEA.  Status post resection, no evidence of malignancy.-2011 Patient had exploratory laparotomy because of abnormal PET scan following rising CEA and was negative for any malignancy. CEA  remains stable in the range of 5.0   #Erythrocytosis was thought to be secondary, negative for Jak 2 mutation with reflex, BCR ABL mutation negative. # Sleep Apnea, patient establish care with Dr. Faye Hoops.   on CPAP machine.  # 03/19/2020, ultrasound abdomen complete showed indeterminate lesions in the dome of liver.  Recommend MRI. 04/04/2020, patient had MRI abdomen with and without contrast Focal hepatic lesions [right dome2.3 x 1.9 cm, left lateral segment ] and in the right and left hepatic lobes, suspicious for metastatic disease. There is also a potential solid right adnexal lesion measuring 2.6 x 2.4 cm.  Potential ovarian neoplasm.  04/15/2020 US  pelvis showed  Questionable visualization of a tiny RIGHT ovary containing 6 mm and 5 mm cysts; no follow-up imaging recommended. Questionable 3.4 cm diameter shadowing mass in RIGHT pelvis versus artifact related to stool within  the colon; recommend dedicated CT imaging of the pelvis with IV contrast to assess.  04/24/2020 PET scan showed the liver lesions are not hypermetabolic.    05/02/2021 Liver lesion was further evaluated by contrast enhanced US  and Hypoechoic mass in the dome of the right lobe of the liver measuring up to  3.9 cm which demonstrates very early, flash filling diffuse enhancement without evidence of washout. These findings are most compatible with a flash filling hemangioma. Biopsy was not proceeded.   She had CABG x 4 on 05/07/21, hospitalization was complicated by Atrial fibrilliation.   # 10/24/2021 enhanced US  showed right hepatic dome solid mass is smaller comparing to prior, similar contrast-enhancement characteristics.  Favor benign etiology.  Left hepatic mass appears similar.   INTERVAL HISTORY Susan Weeks is a 73 y.o. female who has above history reviewed by me today presents for follow-up visit for management of erythrocytosis, history of colon cancer. Patient reports feeling well.  No new complaints. Denies any change of bowel habits, blood in his stool, abdominal pain. She has gained weight  Review of Systems  Constitutional:  Negative for chills, fever, malaise/fatigue and weight loss.  HENT:  Negative for sore throat.   Eyes:  Negative for redness.  Respiratory:  Negative for cough, shortness of breath and wheezing.   Cardiovascular:  Negative for chest pain, palpitations and leg swelling.  Gastrointestinal:  Negative for abdominal pain, blood in stool, nausea and vomiting.  Genitourinary:  Negative for dysuria.  Musculoskeletal:  Negative for myalgias.  Skin:  Negative for rash.  Neurological:  Negative for dizziness, tingling and tremors.  Endo/Heme/Allergies:  Does not bruise/bleed easily.  Psychiatric/Behavioral:  Negative for hallucinations.     MEDICAL HISTORY:  Past Medical History:  Diagnosis Date   Abnormal liver function    Colon cancer (HCC)    adenocarcinoma of the cecum s/p partial colon resection   Coronary artery disease    a. 03/2021 Cor CTA: sev LAD dzs; b. 04/2021 Cath: LM nl, LAD 190m (unable to wire), D1 60, LCX nl, OM3 60, LPAV 100, RCA 90p; c. 05/2021 CABG x 4: LIMA->LAD, VG->RPDA, VG->OM1, VG->OM3.   History of colon polyps    Hyperlipidemia     Hypertension    Post-operative Atrial Fibrillation (HCC)    a. 05/2021 Brief PAF after CABG-->amio.   Sleep apnea 12/28/2017   Systolic murmur    a. 04/2021 Echo: EF 60-65%, no rwma, GrI DD, RVSP 28.27mmHg. Mild-mod MR.    SURGICAL HISTORY: Past Surgical History:  Procedure Laterality Date   ABDOMINAL HYSTERECTOMY  1984   secondary to bleeding and fibroid tumors   APPENDECTOMY     COLON SURGERY     COLONOSCOPY W/ POLYPECTOMY  2001   COLONOSCOPY WITH PROPOFOL  N/A 07/28/2016   Procedure: COLONOSCOPY WITH PROPOFOL ;  Surgeon: Deveron Fly, MD;  Location: [facility] ENDOSCOPY;  Service: Endoscopy;  Laterality: N/A;   COLONOSCOPY WITH PROPOFOL  N/A 06/30/2022   Procedure: COLONOSCOPY WITH PROPOFOL ;  Surgeon: Shane Darling, MD;  Location: [facility] ENDOSCOPY;  Service: Endoscopy;  Laterality: N/A;   CORONARY ANGIOPLASTY     CORONARY ARTERY BYPASS GRAFT N/A 05/07/2021   Procedure: CORONARY ARTERY BYPASS GRAFTING (CABG) X 4, USING LEFT INTERNAL MAMMARY ARTERY, LEFT LEG GREATER SAPHENOUS VEIN HARVESTED ENDOSCOPICALLY;  Surgeon: Hilarie Lovely, MD;  Location: [facility] OR;  Service: Open Heart Surgery;  Laterality: N/A;   CORONARY STENT INTERVENTION N/A 04/15/2021   Procedure: CORONARY STENT INTERVENTION;  Surgeon: [name] Hamilton, MD;  Location: [facility] INVASIVE CV LAB;  Service: Cardiovascular;  Laterality: N/A;   DILATION AND CURETTAGE OF UTERUS  1967   ENDOVEIN HARVEST OF GREATER SAPHENOUS VEIN Left 05/07/2021   Procedure: ENDOVEIN HARVEST OF GREATER SAPHENOUS VEIN;  Surgeon: Hilarie Lovely, MD;  Location: [facility] OR;  Service: Open Heart Surgery;  Laterality: Left;   IR THORACENTESIS ASP PLEURAL SPACE W/IMG GUIDE  05/10/2021   LEFT HEART CATH AND CORONARY ANGIOGRAPHY N/A 04/15/2021   Procedure: LEFT HEART CATH AND CORONARY ANGIOGRAPHY;  Surgeon: [name] Hamilton, MD;  Location: [facility] INVASIVE CV LAB;  Service: Cardiovascular;  Laterality: N/A;   TEE WITHOUT CARDIOVERSION N/A 05/07/2021    Procedure: TRANSESOPHAGEAL ECHOCARDIOGRAM (TEE);  Surgeon: Hilarie Lovely, MD;  Location: [facility] OR;  Service: Open Heart Surgery;  Laterality: N/A;    SOCIAL HISTORY: Social History   Socioeconomic History   Marital status: Married    Spouse name: Eddie   Number of children: 1   Years of education: HS   Highest education level: Not on file  Occupational History    Employer: OTHER    Comment: [employer]  Tobacco Use   Smoking status: Former    Current packs/day: 0.00    Types: Cigarettes    Start date: 1968    Quit date: 1978    Years since quitting: 47.4   Smokeless tobacco: Never   Tobacco comments:    1 pack per week--only smoked on the weekends.   Vaping Use   Vaping status: Never Used  Substance and Sexual Activity   Alcohol use: No    Alcohol/week: 0.0 standard drinks of alcohol   Drug use: No   Sexual activity: Not on file  Other Topics Concern   Not on file  Social History Narrative   Not on file   Social Drivers of Health   Financial Resource Strain: Not on file  Food Insecurity: Not on file  Transportation Needs: Not on file  Physical Activity: Not on file  Stress: Not on file  Social Connections: Not on file  Intimate Partner Violence: Not on file    FAMILY HISTORY: Family History  Problem Relation Age of Onset   Stroke Mother    Hypertension Mother    Cancer Father        Prostate    Hypertension Sister        x4   Heart attack Brother    Heart disease Brother        myocardial infarction   Hypertension Brother    Breast cancer Neg Hx     ALLERGIES:  is allergic to silicone, tape, lisinopril , and z-pak [azithromycin].  MEDICATIONS:  Current Outpatient Medications  Medication Sig Dispense Refill   acetaminophen  (TYLENOL ) 500 MG tablet Take 500 mg by mouth every 6 (six) hours as needed for moderate pain.     amLODipine  (NORVASC ) 5 MG tablet Take 1 tablet (5 mg total) by mouth daily. 180 tablet 3   aspirin  EC 325 MG EC tablet  Take 1 tablet (325 mg total) by mouth daily.     Calcium  Carbonate-Vitamin D (CALCIUM  600+D PO) Take 1 tablet by mouth daily.     Evolocumab  (REPATHA  SURECLICK) 140 MG/ML SOAJ Inject 140 mg into the skin every 14 (fourteen) days. 6 mL 3   metoprolol  tartrate (LOPRESSOR ) 25 MG tablet TAKE 1 TABLET BY MOUTH TWICE A DAY 180 tablet 3   mupirocin  ointment (BACTROBAN ) 2 % Apply 1 Application topically 2 (two) times daily. 22 g  0   NON FORMULARY Pt uses a cpap nightly     vitamin E 400 UNIT capsule Take 400 Units by mouth daily.      No current facility-administered medications for this visit.     PHYSICAL EXAMINATION:  Vitals:   10/15/23 1353 10/15/23 1405  BP: (!) 158/76 (!) 151/75  Pulse: 71   Resp: 18   Temp: (!) 96 F (35.6 C)   SpO2: 100%    Filed Weights   10/15/23 1353  Weight: 188 lb 1.6 oz (85.3 kg)    Physical Exam Constitutional:      General: She is not in acute distress. HENT:     Head: Normocephalic and atraumatic.   Eyes:     General: No scleral icterus.   Cardiovascular:     Rate and Rhythm: Normal rate.  Pulmonary:     Effort: Pulmonary effort is normal. No respiratory distress.     Breath sounds: No wheezing.  Abdominal:     General: Bowel sounds are normal. There is no distension.     Palpations: Abdomen is soft.   Musculoskeletal:        General: No deformity. Normal range of motion.     Cervical back: Normal range of motion and neck supple.   Skin:    General: Skin is warm and dry.     Findings: No erythema or rash.   Neurological:     Mental Status: She is alert and oriented to person, place, and time. Mental status is at baseline.   Psychiatric:        Mood and Affect: Mood normal.      LABORATORY DATA:  I have reviewed the data as listed Lab Results  Component Value Date   WBC 3.8 (L) 10/13/2023   HGB 15.6 (H) 10/13/2023   HCT 46.8 (H) 10/13/2023   MCV 87.8 10/13/2023   PLT 209 10/13/2023   Recent Labs    01/08/23 0947  03/30/23 1334 05/22/23 0850 09/23/23 0745 10/13/23 1030  NA 140 140 139 139 138  K 3.8 4.0 3.8 4.4 4.3  CL 105 103 103 103 105  CO2 28 25 28 29 26   GLUCOSE 92 98 88 101* 99  BUN 22 20 17 15 20   CREATININE 0.99 1.14* 0.91 0.84 1.04*  CALCIUM  9.7 9.4 9.4 9.5 8.9  GFRNONAA  --  51*  --   --  57*  PROT 7.3 7.2 6.9 7.3 6.9  ALBUMIN  4.2 4.2 4.4 4.5 4.0  AST 25 46* 30 24 56*  ALT 52* 54* 50* 42* 64*  ALKPHOS 103 108 100 102 101  BILITOT 0.8 0.7 0.9 0.8 0.9  BILIDIR 0.2  --  0.2 0.2  --

## 2023-10-15 NOTE — Assessment & Plan Note (Signed)
#  History of colon cancer, chronically elevated CEA,  October 2023 CT chest abdomen pelvis showed no evidence of cancer recurrence. 06/30/2022 colonoscopy no recurrence.

## 2023-10-15 NOTE — Assessment & Plan Note (Signed)
#  Secondary erythrocytosis secondary to sleep apnea Previous work up JAK2 V617F mutation negative, with reflex to other mutations CALR, MPL, JAK 2 Ex 12-15 mutations negative. BCR ABL mutation negative She is compliant with CPAP machine.  No need for phlebotomy at this point.  Continue observation.

## 2023-10-15 NOTE — Assessment & Plan Note (Signed)
#   liver lesions she has had extensive work up including No hypermetabolism on PET, Enhanced US showed likely benign hemangioma and biopsy was not proceeded. 10/30/2022 contrast enhanced US showed  1. Continued slight decreased size of previously visualized right hepatic dome hyperechoic mass with contrast enhancement characteristics which again favored benign etiology such as hemangioma. 2. Previously visualized left lobe hepatic mass is no longer conspicuous  Plan to repeat enhanced ultrasound in 6 months - 1 year interval

## 2024-01-13 ENCOUNTER — Other Ambulatory Visit: Payer: Self-pay | Admitting: Internal Medicine

## 2024-01-13 DIAGNOSIS — Z1231 Encounter for screening mammogram for malignant neoplasm of breast: Secondary | ICD-10-CM

## 2024-01-20 ENCOUNTER — Ambulatory Visit (INDEPENDENT_AMBULATORY_CARE_PROVIDER_SITE_OTHER): Admitting: Internal Medicine

## 2024-01-20 ENCOUNTER — Encounter: Payer: Self-pay | Admitting: Internal Medicine

## 2024-01-20 VITALS — BP 126/70 | HR 66 | Resp 16 | Ht 63.0 in | Wt 189.2 lb

## 2024-01-20 DIAGNOSIS — Z85038 Personal history of other malignant neoplasm of large intestine: Secondary | ICD-10-CM

## 2024-01-20 DIAGNOSIS — K769 Liver disease, unspecified: Secondary | ICD-10-CM

## 2024-01-20 DIAGNOSIS — E7800 Pure hypercholesterolemia, unspecified: Secondary | ICD-10-CM

## 2024-01-20 DIAGNOSIS — I25119 Atherosclerotic heart disease of native coronary artery with unspecified angina pectoris: Secondary | ICD-10-CM

## 2024-01-20 DIAGNOSIS — G4730 Sleep apnea, unspecified: Secondary | ICD-10-CM

## 2024-01-20 DIAGNOSIS — R739 Hyperglycemia, unspecified: Secondary | ICD-10-CM | POA: Diagnosis not present

## 2024-01-20 DIAGNOSIS — Z0000 Encounter for general adult medical examination without abnormal findings: Secondary | ICD-10-CM | POA: Diagnosis not present

## 2024-01-20 DIAGNOSIS — Z23 Encounter for immunization: Secondary | ICD-10-CM | POA: Diagnosis not present

## 2024-01-20 DIAGNOSIS — D751 Secondary polycythemia: Secondary | ICD-10-CM

## 2024-01-20 DIAGNOSIS — I10 Essential (primary) hypertension: Secondary | ICD-10-CM

## 2024-01-20 LAB — BASIC METABOLIC PANEL WITH GFR
BUN: 18 mg/dL (ref 6–23)
CO2: 31 meq/L (ref 19–32)
Calcium: 9.6 mg/dL (ref 8.4–10.5)
Chloride: 102 meq/L (ref 96–112)
Creatinine, Ser: 0.93 mg/dL (ref 0.40–1.20)
GFR: 60.68 mL/min (ref >60.00)
Glucose, Bld: 91 mg/dL (ref 70–99)
Potassium: 3.8 meq/L (ref 3.5–5.1)
Sodium: 140 meq/L (ref 135–145)

## 2024-01-20 LAB — LIPID PANEL
Cholesterol: 137 mg/dL (ref 0–200)
HDL: 62.3 mg/dL (ref >39.00)
LDL Cholesterol: 55 mg/dL (ref 0–99)
NonHDL: 74.92
Total CHOL/HDL Ratio: 2
Triglycerides: 100 mg/dL (ref 0.0–149.0)
VLDL: 20 mg/dL (ref 0.0–40.0)

## 2024-01-20 LAB — HEPATIC FUNCTION PANEL
ALT: 40 U/L — ABNORMAL HIGH (ref 0–35)
AST: 26 U/L (ref 0–37)
Albumin: 4.3 g/dL (ref 3.5–5.2)
Alkaline Phosphatase: 104 U/L (ref 39–117)
Bilirubin, Direct: 0.2 mg/dL (ref 0.0–0.3)
Total Bilirubin: 0.9 mg/dL (ref 0.2–1.2)
Total Protein: 6.8 g/dL (ref 6.0–8.3)

## 2024-01-20 LAB — HEMOGLOBIN A1C: Hgb A1c MFr Bld: 6.1 % (ref 4.6–6.5)

## 2024-01-20 NOTE — Assessment & Plan Note (Signed)
 Low-carb diet and exercise.  Follow met b and A1c.

## 2024-01-20 NOTE — Assessment & Plan Note (Signed)
 Has  been evaluated by GI - f/u elevated liver enzymes and recommended f/u colonoscopy.  Colonoscopy 06/2022 - unremarkable.  Recommended f/u in 5 years. Saw Dr Babara 01/2022 - ordered CT chest/abd/pelvis.  F/u colonoscopy 06/2022 - recommended f/u colonoscopy in 5 years.

## 2024-01-20 NOTE — Assessment & Plan Note (Addendum)
 Physical today 01/20/24  Mammogram 03/18/23 - Birads I.  Schedule for f/u mammogram in 03/2024. Colonoscopy 10/2019 - recommended f/u in 3 years.  Follow up colonoscopy 06/2022 - ok.  Recommended f/u in 5 years.

## 2024-01-20 NOTE — Assessment & Plan Note (Signed)
 On Repatha .  Low cholesterol diet and exercise.  Check lipid panel today.  Lab Results  Component Value Date   CHOL 145 09/23/2023   HDL 67.00 09/23/2023   LDLCALC 57 09/23/2023   LDLDIRECT 53.0 07/18/2021   TRIG 105.0 09/23/2023   CHOLHDL 2 09/23/2023

## 2024-01-20 NOTE — Progress Notes (Signed)
 Subjective:    [name]: Susan Weeks, female    DOB: 1950-06-12, 73 y.o.   MRN: [id number]  Patient here for  Chief Complaint  Patient presents with   Annual Exam    HPI Here for a physical exam. Saw Dr Babara 10/15/23 - f/u liver lesions - work up including No hypermetabolism on PET, Enhanced US  showed likely benign hemangioma and biopsy was not recommended. 10/2022 - ultrasound -  continued slight decreased size of previously visualized right hepatic dome hyperechoic mass with contrast enhancement characteristics which again favored benign etiology such as hemangioma. Previously visualized left lobe hepatic mass is no longer conspicuous. Recommend to repeat enhanced ultrasound in 6 months - 1 year. Also f/u secondary erythrocytosis secondary to sleep apnea. Continue cpap. History of colon cancer, chronically elevated CEA,  October 2023 CT chest abdomen pelvis showed no evidence of cancer recurrence. 06/30/2022 colonoscopy no recurrence. She reports she is doing well. Feels good. Walking. States she has bene eating more bread. Plans to adjust. No abdominal pain or bowel change. Does report some minimal ankle/lower extremity swelling. Discussed sodium intake and staying hydrated. No sob.    Past Medical History:  Diagnosis Date   Abnormal liver function    Colon cancer (HCC)    adenocarcinoma of the cecum s/p partial colon resection   Coronary artery disease    a. 03/2021 Cor CTA: sev LAD dzs; b. 04/2021 Cath: LM nl, LAD 156m (unable to wire), D1 60, LCX nl, OM3 60, LPAV 100, RCA 90p; c. 05/2021 CABG x 4: LIMA->LAD, VG->RPDA, VG->OM1, VG->OM3.   History of colon polyps    Hyperlipidemia    Hypertension    Post-operative Atrial Fibrillation (HCC)    a. 05/2021 Brief PAF after CABG-->amio.   Sleep apnea 12/28/2017   Systolic murmur    a. 04/2021 Echo: EF 60-65%, no rwma, GrI DD, RVSP 28.68mmHg. Mild-mod MR.   Past Surgical History:  Procedure Laterality Date   ABDOMINAL HYSTERECTOMY  1984    secondary to bleeding and fibroid tumors   APPENDECTOMY     COLON SURGERY     COLONOSCOPY W/ POLYPECTOMY  2001   COLONOSCOPY WITH PROPOFOL  N/A 07/28/2016   Procedure: COLONOSCOPY WITH PROPOFOL ;  Surgeon: Gladis RAYMOND Mariner, MD;  Location: [facility] ENDOSCOPY;  Service: Endoscopy;  Laterality: N/A;   COLONOSCOPY WITH PROPOFOL  N/A 06/30/2022   Procedure: COLONOSCOPY WITH PROPOFOL ;  Surgeon: Maryruth Ole DASEN, MD;  Location: [facility] ENDOSCOPY;  Service: Endoscopy;  Laterality: N/A;   CORONARY ANGIOPLASTY     CORONARY ARTERY BYPASS GRAFT N/A 05/07/2021   Procedure: CORONARY ARTERY BYPASS GRAFTING (CABG) X 4, USING LEFT INTERNAL MAMMARY ARTERY, LEFT LEG GREATER SAPHENOUS VEIN HARVESTED ENDOSCOPICALLY;  Surgeon: Shyrl Linnie KIDD, MD;  Location: [facility] OR;  Service: Open Heart Surgery;  Laterality: N/A;   CORONARY STENT INTERVENTION N/A 04/15/2021   Procedure: CORONARY STENT INTERVENTION;  Surgeon: Darron Deatrice LABOR, MD;  Location: [facility] INVASIVE CV LAB;  Service: Cardiovascular;  Laterality: N/A;   DILATION AND CURETTAGE OF UTERUS  1967   ENDOVEIN HARVEST OF GREATER SAPHENOUS VEIN Left 05/07/2021   Procedure: ENDOVEIN HARVEST OF GREATER SAPHENOUS VEIN;  Surgeon: Shyrl Linnie KIDD, MD;  Location: [facility] OR;  Service: Open Heart Surgery;  Laterality: Left;   IR THORACENTESIS ASP PLEURAL SPACE W/IMG GUIDE  05/10/2021   LEFT HEART CATH AND CORONARY ANGIOGRAPHY N/A 04/15/2021   Procedure: LEFT HEART CATH AND CORONARY ANGIOGRAPHY;  Surgeon: Darron Deatrice LABOR, MD;  Location: [facility] INVASIVE CV LAB;  Service: Cardiovascular;  Laterality: N/A;   TEE WITHOUT CARDIOVERSION N/A 05/07/2021   Procedure: TRANSESOPHAGEAL ECHOCARDIOGRAM (TEE);  Surgeon: Shyrl Linnie KIDD, MD;  [facility] OR;  Service: Open Heart Surgery;  Laterality: N/A;   Family History  Problem Relation Age of Onset   Stroke Mother    Hypertension Mother    Cancer Father        Prostate    Hypertension Sister        x4   Heart attack Brother     Heart disease Brother        myocardial infarction   Hypertension Brother    Breast cancer Neg Hx    Social History   Socioeconomic History   Marital status: Married    Spouse name: Eddie   Number of children: 1   Years of education: HS   Highest education level: Not on file  Occupational History    Employer: OTHER    Comment: [employer]  Tobacco Use   Smoking status: Former    Current packs/day: 0.00    Types: Cigarettes    Start date: 1968    Quit date: 1978    Years since quitting: 47.7   Smokeless tobacco: Never   Tobacco comments:    1 pack per week--only smoked on the weekends.   Vaping Use   Vaping status: Never Used  Substance and Sexual Activity   Alcohol use: No    Alcohol/week: 0.0 standard drinks of alcohol   Drug use: No   Sexual activity: Not on file  Other Topics Concern   Not on file  Social History Narrative   Not on file   Social Drivers of Health   Financial Resource Strain: Not on file  Food Insecurity: Not on file  Transportation Needs: Not on file  Physical Activity: Not on file  Stress: Not on file  Social Connections: Not on file     Review of Systems  Constitutional:  Negative for appetite change and unexpected weight change.  HENT:  Negative for congestion, sinus pressure and sore throat.   Eyes:  Negative for pain and visual disturbance.  Respiratory:  Negative for cough, chest tightness and shortness of breath.   Cardiovascular:  Negative for chest pain, palpitations and leg swelling.  Gastrointestinal:  Negative for abdominal pain, diarrhea, nausea and vomiting.  Genitourinary:  Negative for difficulty urinating and dysuria.  Musculoskeletal:  Negative for joint swelling and myalgias.  Skin:  Negative for color change and rash.  Neurological:  Negative for dizziness and headaches.  Hematological:  Negative for adenopathy. Does not bruise/bleed easily.  Psychiatric/Behavioral:  Negative for agitation and dysphoric mood.         Objective:     BP 126/70   Pulse 66   Resp 16   Ht 5' 3 (1.6 m)   Wt 189 lb 3.2 oz (85.8 kg)   LMP 06/28/1982   SpO2 99%   BMI 33.52 kg/m  Wt Readings from Last 3 Encounters:  01/20/24 189 lb 3.2 oz (85.8 kg)  10/15/23 188 lb 1.6 oz (85.3 kg)  09/24/23 188 lb (85.3 kg)    Physical Exam Vitals reviewed.  Constitutional:      General: She is not in acute distress.    Appearance: Normal appearance. She is well-developed.  HENT:     Head: Normocephalic and atraumatic.     Right Ear: External ear normal.     Left Ear: External ear normal.     Mouth/Throat:  Pharynx: No oropharyngeal exudate or posterior oropharyngeal erythema.  Eyes:     General: No scleral icterus.       Right eye: No discharge.        Left eye: No discharge.     Conjunctiva/sclera: Conjunctivae normal.  Neck:     Thyroid : No thyromegaly.  Cardiovascular:     Rate and Rhythm: Normal rate and regular rhythm.     Comments: 1/6 systolic murmur Pulmonary:     Effort: No tachypnea, accessory muscle usage or respiratory distress.     Breath sounds: Normal breath sounds. No decreased breath sounds or wheezing.  Chest:  Breasts:    Right: No inverted nipple, mass, nipple discharge or tenderness (no axillary adenopathy).     Left: No inverted nipple, mass, nipple discharge or tenderness (no axilarry adenopathy).  Abdominal:     General: Bowel sounds are normal.     Palpations: Abdomen is soft.     Tenderness: There is no abdominal tenderness.  Musculoskeletal:        General: No swelling or tenderness.     Cervical back: Neck supple.     Comments: DP pulses palpable and equal bilaterally.   Lymphadenopathy:     Cervical: No cervical adenopathy.  Skin:    Findings: No erythema or rash.  Neurological:     Mental Status: She is alert and oriented to person, place, and time.  Psychiatric:        Mood and Affect: Mood normal.        Behavior: Behavior normal.         Outpatient  Encounter Medications as of 01/20/2024  Medication Sig   acetaminophen  (TYLENOL ) 500 MG tablet Take 500 mg by mouth every 6 (six) hours as needed for moderate pain.   amLODipine  (NORVASC ) 5 MG tablet Take 1 tablet (5 mg total) by mouth daily.   aspirin  EC 325 MG EC tablet Take 1 tablet (325 mg total) by mouth daily.   Calcium  Carbonate-Vitamin D (CALCIUM  600+D PO) Take 1 tablet by mouth daily.   Evolocumab  (REPATHA  SURECLICK) 140 MG/ML SOAJ Inject 140 mg into the skin every 14 (fourteen) days.   metoprolol  tartrate (LOPRESSOR ) 25 MG tablet TAKE 1 TABLET BY MOUTH TWICE A DAY   mupirocin  ointment (BACTROBAN ) 2 % Apply 1 Application topically 2 (two) times daily.   NON FORMULARY Pt uses a cpap nightly   vitamin E 400 UNIT capsule Take 400 Units by mouth daily.    No facility-administered encounter medications on file as of 01/20/2024.     Lab Results  Component Value Date   WBC 3.8 (L) 10/13/2023   HGB 15.6 (H) 10/13/2023   HCT 46.8 (H) 10/13/2023   PLT 209 10/13/2023   GLUCOSE 99 10/13/2023   CHOL 145 09/23/2023   TRIG 105.0 09/23/2023   HDL 67.00 09/23/2023   LDLDIRECT 53.0 07/18/2021   LDLCALC 57 09/23/2023   ALT 64 (H) 10/13/2023   AST 56 (H) 10/13/2023   NA 138 10/13/2023   K 4.3 10/13/2023   CL 105 10/13/2023   CREATININE 1.04 (H) 10/13/2023   BUN 20 10/13/2023   CO2 26 10/13/2023   TSH 1.58 09/23/2023   INR 1.0 11/15/2021   HGBA1C 5.9 09/23/2023    MM 3D SCREENING MAMMOGRAM BILATERAL BREAST Result Date: 03/19/2023 CLINICAL DATA:  Screening. EXAM: DIGITAL SCREENING BILATERAL MAMMOGRAM WITH TOMOSYNTHESIS AND CAD TECHNIQUE: Bilateral screening digital craniocaudal and mediolateral oblique mammograms were obtained. Bilateral screening digital breast tomosynthesis was performed. The  images were evaluated with computer-aided detection. COMPARISON:  Previous exam(s). ACR Breast Density Category b: There are scattered areas of fibroglandular density. FINDINGS: There are no  findings suspicious for malignancy. IMPRESSION: No mammographic evidence of malignancy. A result letter of this screening mammogram will be mailed directly to the patient. RECOMMENDATION: Screening mammogram in one year. (Code:SM-B-01Y) BI-RADS CATEGORY  1: Negative. Electronically Signed   By: Alm Parkins M.D.   On: 03/19/2023 16:20       Assessment & Plan:  Routine general medical examination at a health care facility  Hyperglycemia Assessment & Plan: Low carb diet and exercise. Follow met b and A1c.   Orders: -     Hemoglobin A1c  Essential hypertension, benign Assessment & Plan: Blood pressure has been doing well on current regimen. She is having some leg swelling. Discussed possible side effect of amlodipine . Has been on this medication for years. Had swelling with lisinopril . (Avoiding ace inhibitor and ARB given previous presumed allergic reaction). No sob. Will continue amlodipine  for now. Check routine labs, including metabolic panel, liver panel. Discussed monitoring sodium intake. Leg elevation. Compression hose.   Orders: -     Basic metabolic panel with GFR  Pure hypercholesterolemia Assessment & Plan: On Repatha .  Low cholesterol diet and exercise.  Check lipid panel today.  Lab Results  Component Value Date   CHOL 145 09/23/2023   HDL 67.00 09/23/2023   LDLCALC 57 09/23/2023   LDLDIRECT 53.0 07/18/2021   TRIG 105.0 09/23/2023   CHOLHDL 2 09/23/2023    Orders: -     Hepatic function panel -     Lipid panel  Health care maintenance Assessment & Plan: Physical today 01/20/24  Mammogram 03/18/23 - Birads I.  Schedule for f/u mammogram in 03/2024. Colonoscopy 10/2019 - recommended f/u in 3 years.  Follow up colonoscopy 06/2022 - ok.  Recommended f/u in 5 years.    Immunization due -     Flu vaccine HIGH DOSE PF(Fluzone Trivalent)  Coronary artery disease involving native coronary artery of native heart with angina pectoris [facility]) Assessment & Plan: Doing well  on amlodipine , metoprolol .  Off statin due to liver enzyme elevation. No chest pain. No sob. Appears to be stable. Continue risk factor modification. Continue repatha .    Erythrocytosis Assessment & Plan: Followed by hematology.  Has been stable. Continue cpap.    History of colon cancer Assessment & Plan: Has  been evaluated by GI - f/u elevated liver enzymes and recommended f/u colonoscopy.  Colonoscopy 06/2022 - unremarkable.  Recommended f/u in 5 years. Saw Dr Babara 01/2022 - ordered CT chest/abd/pelvis.  F/u colonoscopy 06/2022 - recommended f/u colonoscopy in 5 years.      Sleep apnea, unspecified type Assessment & Plan: Continue cpap.    Liver lesion Assessment & Plan: Saw Dr Babara 10/15/23 - f/u liver lesions - work up including No hypermetabolism on PET, Enhanced US  showed likely benign hemangioma and biopsy was not recommended. 10/2022 - ultrasound -  continued slight decreased size of previously visualized right hepatic dome hyperechoic mass with contrast enhancement characteristics which again favored benign etiology such as hemangioma. Previously visualized left lobe hepatic mass is no longer conspicuous. Recommend to repeat enhanced ultrasound in 6 months - 1 year.      Allena Hamilton, MD

## 2024-01-20 NOTE — Assessment & Plan Note (Signed)
 Followed by hematology.  Has been stable. Continue cpap.

## 2024-01-20 NOTE — Assessment & Plan Note (Signed)
 Doing well on amlodipine , metoprolol .  Off statin due to liver enzyme elevation. No chest pain. No sob. Appears to be stable. Continue risk factor modification. Continue repatha .

## 2024-01-20 NOTE — Assessment & Plan Note (Signed)
 Saw Dr Babara 10/15/23 - f/u liver lesions - work up including No hypermetabolism on PET, Enhanced US  showed likely benign hemangioma and biopsy was not recommended. 10/2022 - ultrasound -  continued slight decreased size of previously visualized right hepatic dome hyperechoic mass with contrast enhancement characteristics which again favored benign etiology such as hemangioma. Previously visualized left lobe hepatic mass is no longer conspicuous. Recommend to repeat enhanced ultrasound in 6 months - 1 year.

## 2024-01-20 NOTE — Assessment & Plan Note (Signed)
 Continue cpap.

## 2024-01-20 NOTE — Assessment & Plan Note (Signed)
 Blood pressure has been doing well on current regimen. She is having some leg swelling. Discussed possible side effect of amlodipine . Has been on this medication for years. Had swelling with lisinopril . (Avoiding ace inhibitor and ARB given previous presumed allergic reaction). No sob. Will continue amlodipine  for now. Check routine labs, including metabolic panel, liver panel. Discussed monitoring sodium intake. Leg elevation. Compression hose.

## 2024-01-21 ENCOUNTER — Ambulatory Visit: Payer: Self-pay | Admitting: Internal Medicine

## 2024-03-05 NOTE — Progress Notes (Unsigned)
 Cardiology Clinic Note   Date: 03/07/2024 ID: Susan Weeks, DOB 10-Feb-1951, MRN [id number]  Primary Cardiologist:  Deatrice Cage, MD  Chief Complaint   Susan Weeks is a 73 y.o. female who presents to the clinic today for routine follow up.   Patient Profile   Susan Weeks is followed by Dr. Cage for the history outlined below.      Past medical history significant for: CAD. Coronary CTA with FFR 04/01/2021: Calcium  score 52.4 (61st percentile).  Calcified and noncalcified plaque causing severe mid LAD stenosis.  FFR showed significant stenosis (CTO) in mid LAD, significant stenosis in mid RCA. LHC 04/15/2021: Mid LAD 100%.  D1 60%.  OM 360%.  LPAV 100%.  Proximal RCA 90%.  Unsuccessful angioplasty to LAD secondary to unable to cross lesion.  Recommend CTS consult. Echo 04/19/2021: EF 60 to 65%.  No RWMA.  Grade I DD.  Normal RV size/function.  Normal PA pressure.  Mild to moderate MR.  Aortic valve sclerosis without stenosis. CABG x 4 05/07/2021: LIMA to LAD; reverse SVG to PDA, OM1, OM 3. PAF.  Onset postop CABG January 2023. Hypertension. Hyperlipidemia. Lipid panel 01/20/2024: LDL 55, HDL 62, TG 100, total 137. OSA. CPAP 100% adherence.  Colon cancer.  In summary, patient was first evaluated by Dr. Cage on 03/19/2021 for exertional dyspnea, lower extremity edema, and chest fullness.  Coronary CTA showed severe LAD disease.  Echo showed normal LV/RV function, Grade I DD, mild to moderate MR.  She underwent LHC which showed occluded LAD and severe RCA disease.  Plan for PCI of the LAD and staged PCI to RCA at a later date however LAD lesion could not be crossed therefore patient was referred to CTS for consult.  She underwent CABG x 4 in January 2023.  Postop course complicated by brief episode of A-fib managed with oral amiodarone .  She had a large left pleural effusion in February 2023 requiring Lasix  x 7 days.  She was initially going to have thoracentesis however  ultrasound showed only trace left pleural effusion so procedure was not performed.  Statin therapy was discontinued in March 2023 secondary to elevated LFTs.  Liver lesions seen on prior imaging were extensively worked up and found to be benign.   Patient was seen in the clinic on 03/31/2023 for routine follow-up.  She reported occasional dependent edema which she managed with compression socks.  She had no other complaints.  No changes were made.   Patient was last seen in the office by me on 09/24/2023 for routine follow-up.  She reported increased bilateral ankle edema x 2 months related to dietary indiscretion with sodium.  BP was well-controlled.  Amlodipine  was decreased to 5 mg daily.     History of Present Illness    Today, patient is here alone. She is doing well. Patient denies shortness of breath, dyspnea on exertion, orthopnea or PND. She reports occasional lower extremity edema in bilateral ankles that she notices more when she wears certain shoes.  No chest pain, pressure, or tightness. No palpitations.  She continues to walk for 1 hour 5 days a week without limitation.     ROS: All other systems reviewed and are otherwise negative except as noted in History of Present Illness.  EKGs/Labs Reviewed    EKG Interpretation Date/Time:  Monday March 07 2024 11:16:27 EST Ventricular Rate:  63 PR Interval:  204 QRS Duration:  88 QT Interval:  402 QTC Calculation: 411 R Axis:   -  19  Text Interpretation: Normal sinus rhythm Anterolateral infarct (cited on or before 08-May-2021) When compared with ECG of 24-Sep-2023 11:22, No significant change was found Confirmed by Loistine Sober [phone]) on 03/07/2024 11:18:11 AM   01/20/2024: ALT 40; AST 26; BUN 18; Creatinine, Ser 0.93; Potassium 3.8; Sodium 140   10/13/2023: Hemoglobin 15.6; WBC Count 3.8   09/23/2023: TSH 1.58    Risk Assessment/Calculations     CHA2DS2-VASc Score = 4   This indicates a 4.8% annual risk of  stroke. The patient's score is based upon: CHF History: 0 HTN History: 1 Diabetes History: 0 Stroke History: 0 Vascular Disease History: 1 Age Score: 1 Gender Score: 1             Physical Exam    VS:  BP 128/84 (BP Location: Left Arm, Patient Position: Sitting, Cuff Size: Large)   Pulse 63   Ht 5' 3 (1.6 m)   Wt 186 lb 2 oz (84.4 kg)   LMP 06/28/1982   SpO2 97%   BMI 32.97 kg/m  , BMI Body mass index is 32.97 kg/m.  GEN: Well nourished, well developed, in no acute distress. Neck: No JVD or carotid bruits. Cardiac:  RRR.  No murmur. No rubs or gallops.   Respiratory:  Respirations regular and unlabored. Clear to auscultation without rales, wheezing or rhonchi. GI: Soft, nontender, nondistended. Extremities: Radials/DP/PT 2+ and equal bilaterally. No clubbing or cyanosis. No edema   Skin: Warm and dry, no rash. Neuro: Strength intact.  Assessment & Plan   CAD S/p CABG x 08 May 2021.  Patient denies chest pain, pressure or tightness. She [place] 1 [place]  - Continue amlodipine , metoprolol , aspirin , Repatha .   Lower extremity edema Patient reports occasional lower extremity edema in bilateral ankles that she notices more with certain shoes. No orthopnea or PND. No edema today.  - Utilize compression socks as needed.    PAF Onset postop CABG January 2023. No recurrence. Patient denies palpitations. RRR on exam today. EKG shows NSR.  - Continue to monitor.   Hypertension BP today 128/84. - Continue amlodipine , metoprolol .   Hyperlipidemia LDL 55 September 2025, at goal.  Patient not on statin secondary to history of elevated liver enzymes. - Continue Repatha .  Disposition: Return in 6 months or sooner as needed.          Signed, Sober HERO. Gergory Biello, DNP, NP-C

## 2024-03-07 ENCOUNTER — Ambulatory Visit: Attending: Student | Admitting: Student

## 2024-03-07 ENCOUNTER — Encounter: Payer: Self-pay | Admitting: Student

## 2024-03-07 VITALS — BP 128/84 | HR 63 | Ht 63.0 in | Wt 186.1 lb

## 2024-03-07 DIAGNOSIS — I2510 Atherosclerotic heart disease of native coronary artery without angina pectoris: Secondary | ICD-10-CM | POA: Diagnosis not present

## 2024-03-07 DIAGNOSIS — E785 Hyperlipidemia, unspecified: Secondary | ICD-10-CM | POA: Diagnosis not present

## 2024-03-07 DIAGNOSIS — R600 Localized edema: Secondary | ICD-10-CM

## 2024-03-07 DIAGNOSIS — I480 Paroxysmal atrial fibrillation: Secondary | ICD-10-CM | POA: Diagnosis not present

## 2024-03-07 DIAGNOSIS — I10 Essential (primary) hypertension: Secondary | ICD-10-CM | POA: Diagnosis not present

## 2024-03-07 NOTE — Patient Instructions (Signed)
 Medication Instructions:   Your physician recommends that you continue on your current medications as directed. Please refer to the Current Medication list given to you today.    *If you need a refill on your cardiac medications before your next appointment, please call your pharmacy*  Lab Work:  None ordered at this time   If you have labs (blood work) drawn today and your tests are completely normal, you will receive your results only by:  MyChart Message (if you have MyChart) OR  A paper copy in the mail If you have any lab test that is abnormal or we need to change your treatment, we will call you to review the results.  Testing/Procedures:  None ordered at this time   Referrals:  None ordered at this time   Follow-Up:  At [facility], you and your health needs are our priority.  As part of our continuing mission to provide you with exceptional heart care, our providers are all part of one team.  This team includes your primary Cardiologist (physician) and Advanced Practice Providers or APPs (Physician Assistants and Nurse Practitioners) who all work together to provide you with the care you need, when you need it.  Your next appointment:   5 - 6 month(s)  Provider:    Deatrice Cage, MD or Barnie Hila, NP    We recommend signing up for the patient portal called MyChart.  Sign up information is provided on this After Visit Summary.  MyChart is used to connect with patients for Virtual Visits (Telemedicine).  Patients are able to view lab/test results, encounter notes, upcoming appointments, etc.  Non-urgent messages can be sent to your provider as well.   To learn more about what you can do with MyChart, go to [email or web address].

## 2024-03-21 ENCOUNTER — Ambulatory Visit
Admission: RE | Admit: 2024-03-21 | Discharge: 2024-03-21 | Disposition: A | Discharge status: Home / Self Care | Source: Ambulatory Visit | Attending: Internal Medicine | Admitting: Internal Medicine

## 2024-03-21 DIAGNOSIS — Z1231 Encounter for screening mammogram for malignant neoplasm of breast: Secondary | ICD-10-CM | POA: Diagnosis present

## 2024-03-22 ENCOUNTER — Ambulatory Visit: Admitting: Student

## 2024-05-23 ENCOUNTER — Ambulatory Visit: Admitting: Internal Medicine

## 2024-05-23 ENCOUNTER — Encounter: Payer: Self-pay | Admitting: Internal Medicine

## 2024-05-23 VITALS — BP 130/78 | HR 68 | Temp 97.4°F | Ht 63.0 in | Wt 189.2 lb

## 2024-05-23 DIAGNOSIS — R739 Hyperglycemia, unspecified: Secondary | ICD-10-CM | POA: Diagnosis not present

## 2024-05-23 DIAGNOSIS — R945 Abnormal results of liver function studies: Secondary | ICD-10-CM

## 2024-05-23 DIAGNOSIS — Z85038 Personal history of other malignant neoplasm of large intestine: Secondary | ICD-10-CM | POA: Diagnosis not present

## 2024-05-23 DIAGNOSIS — K769 Liver disease, unspecified: Secondary | ICD-10-CM | POA: Diagnosis not present

## 2024-05-23 DIAGNOSIS — E7800 Pure hypercholesterolemia, unspecified: Secondary | ICD-10-CM

## 2024-05-23 DIAGNOSIS — G4730 Sleep apnea, unspecified: Secondary | ICD-10-CM

## 2024-05-23 DIAGNOSIS — D72819 Decreased white blood cell count, unspecified: Secondary | ICD-10-CM | POA: Diagnosis not present

## 2024-05-23 DIAGNOSIS — I10 Essential (primary) hypertension: Secondary | ICD-10-CM | POA: Diagnosis not present

## 2024-05-23 DIAGNOSIS — I25119 Atherosclerotic heart disease of native coronary artery with unspecified angina pectoris: Secondary | ICD-10-CM

## 2024-05-23 LAB — HEPATIC FUNCTION PANEL
ALT: 33 U/L (ref 3–35)
AST: 21 U/L (ref 5–37)
Albumin: 4.2 g/dL (ref 3.5–5.2)
Alkaline Phosphatase: 91 U/L (ref 39–117)
Bilirubin, Direct: 0.1 mg/dL (ref 0.1–0.3)
Total Bilirubin: 0.5 mg/dL (ref 0.2–1.2)
Total Protein: 6.7 g/dL (ref 6.0–8.3)

## 2024-05-23 LAB — LIPID PANEL
Cholesterol: 111 mg/dL (ref 28–200)
HDL: 51.1 mg/dL
LDL Cholesterol: 46 mg/dL (ref 10–99)
NonHDL: 59.94
Total CHOL/HDL Ratio: 2
Triglycerides: 72 mg/dL (ref 10.0–149.0)
VLDL: 14.4 mg/dL (ref 0.0–40.0)

## 2024-05-23 LAB — BASIC METABOLIC PANEL WITH GFR
BUN: 21 mg/dL (ref 6–23)
CO2: 28 meq/L (ref 19–32)
Calcium: 9.1 mg/dL (ref 8.4–10.5)
Chloride: 105 meq/L (ref 96–112)
Creatinine, Ser: 0.88 mg/dL (ref 0.40–1.20)
GFR: 64.69 mL/min
Glucose, Bld: 101 mg/dL — ABNORMAL HIGH (ref 70–99)
Potassium: 4.1 meq/L (ref 3.5–5.1)
Sodium: 140 meq/L (ref 135–145)

## 2024-05-23 LAB — HEMOGLOBIN A1C: Hgb A1c MFr Bld: 6 % (ref 4.6–6.5)

## 2024-05-23 NOTE — Progress Notes (Signed)
 "  Subjective:    [name]: Susan Weeks, female    DOB: [birth year], 74 y.o.   MRN: [id number]  Patient here for follow up appt.   HPI Here for a scheduled follow up. Saw Dr Babara 10/15/23 - f/u liver lesions - work up including No hypermetabolism on PET, Enhanced US  showed likely benign hemangioma and biopsy was not recommended. 10/2022 - ultrasound -  continued slight decreased size of previously visualized right hepatic dome hyperechoic mass with contrast enhancement characteristics which again favored benign etiology such as hemangioma. Previously visualized left lobe hepatic mass is no longer conspicuous. Recommend to repeat enhanced ultrasound in 6 months - 1 year. Also f/u secondary erythrocytosis secondary to sleep apnea. Continue cpap. History of colon cancer, chronically elevated CEA,  October 2023 CT chest abdomen pelvis showed no evidence of cancer recurrence. 06/30/2022 colonoscopy no recurrence. Had f/u with cardiology 03/07/24 - f/u CAD s/p CABG 08/2022 - no changes made. Recommended compression hose for lower extremity edema. She reports that she has been doing well overall. Last week - runny nose and some congestion. Took coricidin. Resolved now. Feels back to her normal. No chest congestion. No sob. No cough. No chest pain. [place] the mall. No abdominal pain or bowel change. Discussed shingles vaccine.    Past Medical History:  Diagnosis Date   Abnormal liver function    Colon cancer (HCC)    adenocarcinoma of the cecum s/p partial colon resection   Coronary artery disease    a. 03/2021 Cor CTA: sev LAD dzs; b. 04/2021 Cath: LM nl, LAD 113m (unable to wire), D1 60, LCX nl, OM3 60, LPAV 100, RCA 90p; c. 05/2021 CABG x 4: LIMA->LAD, VG->RPDA, VG->OM1, VG->OM3.   History of colon polyps    Hyperlipidemia    Hypertension    Post-operative Atrial Fibrillation (HCC)    a. 05/2021 Brief PAF after CABG-->amio.   Sleep apnea 12/28/2017   Systolic murmur    a. 04/2021 Echo: EF  60-65%, no rwma, GrI DD, RVSP 28.38mmHg. Mild-mod MR.   Past Surgical History:  Procedure Laterality Date   ABDOMINAL HYSTERECTOMY  1984   secondary to bleeding and fibroid tumors   APPENDECTOMY     COLON SURGERY     COLONOSCOPY W/ POLYPECTOMY  2001   COLONOSCOPY WITH PROPOFOL  N/A 07/28/2016   Procedure: COLONOSCOPY WITH PROPOFOL ;  Surgeon: Gladis RAYMOND Mariner, MD;  Location: [facility] ENDOSCOPY;  Service: Endoscopy;  Laterality: N/A;   COLONOSCOPY WITH PROPOFOL  N/A 06/30/2022   Procedure: COLONOSCOPY WITH PROPOFOL ;  Surgeon: Maryruth Ole DASEN, MD;  Location: [facility] ENDOSCOPY;  Service: Endoscopy;  Laterality: N/A;   CORONARY ANGIOPLASTY     CORONARY ARTERY BYPASS GRAFT N/A 05/07/2021   Procedure: CORONARY ARTERY BYPASS GRAFTING (CABG) X 4, USING LEFT INTERNAL MAMMARY ARTERY, LEFT LEG GREATER SAPHENOUS VEIN HARVESTED ENDOSCOPICALLY;  Surgeon: Shyrl Linnie KIDD, MD;  Location: [facility] OR;  Service: Open Heart Surgery;  Laterality: N/A;   CORONARY STENT INTERVENTION N/A 04/15/2021   Procedure: CORONARY STENT INTERVENTION;  Surgeon: Darron Deatrice LABOR, MD;  Location: [facility] INVASIVE CV LAB;  Service: Cardiovascular;  Laterality: N/A;   DILATION AND CURETTAGE OF UTERUS  1967   ENDOVEIN HARVEST OF GREATER SAPHENOUS VEIN Left 05/07/2021   Procedure: ENDOVEIN HARVEST OF GREATER SAPHENOUS VEIN;  Surgeon: Shyrl Linnie KIDD, MD;  Location: [facility] OR;  Service: Open Heart Surgery;  Laterality: Left;   IR THORACENTESIS RIGHT ASP PLEURAL SPACE W/IMG GUIDE  05/10/2021   LEFT HEART CATH  AND CORONARY ANGIOGRAPHY N/A 04/15/2021   Procedure: LEFT HEART CATH AND CORONARY ANGIOGRAPHY;  Surgeon: Darron Deatrice LABOR, MD;  Location: [facility] INVASIVE CV LAB;  Service: Cardiovascular;  Laterality: N/A;   TEE WITHOUT CARDIOVERSION N/A 05/07/2021   Procedure: TRANSESOPHAGEAL ECHOCARDIOGRAM (TEE);  Surgeon: Shyrl Linnie KIDD, MD;  Location: [facility] OR;  Service: Open Heart Surgery;  Laterality: N/A;   Family History  Problem Relation Age  of Onset   Stroke Mother    Hypertension Mother    Cancer Father        Prostate    Hypertension Sister        x4   Heart attack Brother    Heart disease Brother        myocardial infarction   Hypertension Brother    Breast cancer Neg Hx    Social History   Socioeconomic History   Marital status: Married    Spouse name: Eddie   Number of children: 1   Years of education: HS   Highest education level: Not on file  Occupational History    Employer: OTHER    Comment: [employer]  Tobacco Use   Smoking status: Former    Current packs/day: 0.00    Types: Cigarettes    Start date: 1968    Quit date: 1978    Years since quitting: 48.0   Smokeless tobacco: Never   Tobacco comments:    1 pack per week--only smoked on the weekends.   Vaping Use   Vaping status: Never Used  Substance and Sexual Activity   Alcohol use: No    Alcohol/week: 0.0 standard drinks of alcohol   Drug use: No   Sexual activity: Not on file  Other Topics Concern   Not on file  Social History Narrative   Not on file   Social Drivers of Health   Tobacco Use: Medium Risk (05/28/2024)   Patient History    Smoking Tobacco Use: Former    Smokeless Tobacco Use: Never    Passive Exposure: Not on Actuary Strain: Not on file  Food Insecurity: Not on file  Transportation Needs: Not on file  Physical Activity: Not on file  Stress: Not on file  Social Connections: Not on file  Depression (PHQ2-9): Low Risk (05/23/2024)   Depression (PHQ2-9)    PHQ-2 Score: 0  Alcohol Screen: Not on file  Housing: Not on file  Utilities: Not on file  Health Literacy: Not on file     Review of Systems  Constitutional:  Negative for appetite change and unexpected weight change.  HENT:  Negative for sinus pressure.        Previous nasal congestion and runny nose - resolved.   Respiratory:  Negative for cough, chest tightness and shortness of breath.   Cardiovascular:  Negative for chest pain,  palpitations and leg swelling.  Gastrointestinal:  Negative for abdominal pain, diarrhea, nausea and vomiting.  Genitourinary:  Negative for difficulty urinating and dysuria.  Musculoskeletal:  Negative for joint swelling and myalgias.  Skin:  Negative for color change and rash.  Neurological:  Negative for dizziness and headaches.  Psychiatric/Behavioral:  Negative for agitation and dysphoric mood.        Objective:     BP 130/78   Pulse 68   Temp (!) 97.4 F (36.3 C) (Oral)   Ht 5' 3 (1.6 m)   Wt 189 lb 3.2 oz (85.8 kg)   LMP 06/28/1982   SpO2 97%   BMI 33.52  kg/m  Wt Readings from Last 3 Encounters:  05/23/24 189 lb 3.2 oz (85.8 kg)  03/07/24 186 lb 2 oz (84.4 kg)  01/20/24 189 lb 3.2 oz (85.8 kg)    Physical Exam Vitals reviewed.  Constitutional:      General: She is not in acute distress.    Appearance: Normal appearance.  HENT:     Head: Normocephalic and atraumatic.     Right Ear: External ear normal.     Left Ear: External ear normal.     Mouth/Throat:     Pharynx: No oropharyngeal exudate or posterior oropharyngeal erythema.  Eyes:     General: No scleral icterus.       Right eye: No discharge.        Left eye: No discharge.     Conjunctiva/sclera: Conjunctivae normal.  Neck:     Thyroid : No thyromegaly.  Cardiovascular:     Rate and Rhythm: Normal rate and regular rhythm.  Pulmonary:     Effort: No respiratory distress.     Breath sounds: Normal breath sounds. No wheezing.  Abdominal:     General: Bowel sounds are normal.     Palpations: Abdomen is soft.     Tenderness: There is no abdominal tenderness.  Musculoskeletal:        General: No swelling or tenderness.     Cervical back: Neck supple. No tenderness.  Lymphadenopathy:     Cervical: No cervical adenopathy.  Skin:    Findings: No erythema or rash.  Neurological:     Mental Status: She is alert.  Psychiatric:        Mood and Affect: Mood normal.        Behavior: Behavior normal.          Outpatient Encounter Medications as of 05/23/2024  Medication Sig   acetaminophen  (TYLENOL ) 500 MG tablet Take 500 mg by mouth every 6 (six) hours as needed for moderate pain.   amLODipine  (NORVASC ) 5 MG tablet Take 1 tablet (5 mg total) by mouth daily.   aspirin  EC 325 MG EC tablet Take 1 tablet (325 mg total) by mouth daily.   Calcium  Carbonate-Vitamin D (CALCIUM  600+D PO) Take 1 tablet by mouth daily.   Evolocumab  (REPATHA  SURECLICK) 140 MG/ML SOAJ Inject 140 mg into the skin every 14 (fourteen) days.   metoprolol  tartrate (LOPRESSOR ) 25 MG tablet TAKE 1 TABLET BY MOUTH TWICE A DAY   mupirocin  ointment (BACTROBAN ) 2 % Apply 1 Application topically 2 (two) times daily.   NON FORMULARY Pt uses a cpap nightly   vitamin E 400 UNIT capsule Take 400 Units by mouth daily.    No facility-administered encounter medications on file as of 05/23/2024.     Lab Results  Component Value Date   WBC 3.8 (L) 10/13/2023   HGB 15.6 (H) 10/13/2023   HCT 46.8 (H) 10/13/2023   PLT 209 10/13/2023   GLUCOSE 101 (H) 05/23/2024   CHOL 111 05/23/2024   TRIG 72.0 05/23/2024   HDL 51.10 05/23/2024   LDLDIRECT 53.0 07/18/2021   LDLCALC 46 05/23/2024   ALT 33 05/23/2024   AST 21 05/23/2024   NA 140 05/23/2024   K 4.1 05/23/2024   CL 105 05/23/2024   CREATININE 0.88 05/23/2024   BUN 21 05/23/2024   CO2 28 05/23/2024   TSH 1.58 09/23/2023   INR 1.0 11/15/2021   HGBA1C 6.0 05/23/2024    MM 3D SCREENING MAMMOGRAM BILATERAL BREAST Result Date: 03/22/2024 CLINICAL DATA:  Screening. EXAM: DIGITAL  SCREENING BILATERAL MAMMOGRAM WITH TOMOSYNTHESIS AND CAD TECHNIQUE: Bilateral screening digital craniocaudal and mediolateral oblique mammograms were obtained. Bilateral screening digital breast tomosynthesis was performed. The images were evaluated with computer-aided detection. COMPARISON:  Previous exam(s). ACR Breast Density Category b: There are scattered areas of fibroglandular density. FINDINGS:  There are no findings suspicious for malignancy. IMPRESSION: No mammographic evidence of malignancy. A result letter of this screening mammogram will be mailed directly to the patient. RECOMMENDATION: Screening mammogram in one year. (Code:SM-B-01Y) BI-RADS CATEGORY  1: Negative. Electronically Signed   By: Dina  Arceo M.D.   On: 03/22/2024 11:38       Assessment & Plan:  Abnormal liver function Assessment & Plan: Off statin due to elevated liver function tests.  Being evaluated by oncology.  Dr Babara - ordered f/u abdominal ultrasound - liver mass unchanged/smaller - favored benign lesion.  F/u Dr Babara - f/u enhanced ultrasound recommended one year.  F/u ultrasound - Continued slight decreased size of previously visualized right hepatic dome hyperechoic mass with contrast enhancement characteristics which again favored benign etiology such as hemangioma. Previously visualized left lobe hepatic mass is no longer conspicuous. Check liver panel.    Pure hypercholesterolemia Assessment & Plan: On Repatha .  Low cholesterol diet and exercise.  Follow lipid panel.  Lab Results  Component Value Date   CHOL 111 05/23/2024   HDL 51.10 05/23/2024   LDLCALC 46 05/23/2024   LDLDIRECT 53.0 07/18/2021   TRIG 72.0 05/23/2024   CHOLHDL 2 05/23/2024    Orders: -     Lipid panel -     Hepatic function panel  Essential hypertension, benign Assessment & Plan: Had swelling with lisinopril . (Avoiding ace inhibitor and ARB given previous presumed allergic reaction). No sob. Will continue amlodipine . Check routine labs, including metabolic panel. Follow pressures.   Orders: -     Basic metabolic panel with GFR  Hyperglycemia Assessment & Plan: Low carb diet and exercise. Follow met b and A1c.   Orders: -     Hemoglobin A1c  Coronary artery disease involving native coronary artery of native heart with angina pectoris Assessment & Plan: Doing well on amlodipine , metoprolol .  Off statin due to liver  enzyme elevation. No chest pain. No sob. Appears to be stable. Continue risk factor modification. Continue repatha . Walking daily. No chest pain.    History of colon cancer Assessment & Plan: Has  been evaluated by GI - f/u elevated liver enzymes and recommended f/u colonoscopy.  Colonoscopy 06/2022 - unremarkable.  Recommended f/u in 5 years. Saw Dr Babara 01/2022 - ordered CT chest/abd/pelvis.  F/u colonoscopy 06/2022 - recommended f/u colonoscopy in 5 years.      Liver lesion Assessment & Plan: Saw Dr Babara 10/15/23 - f/u liver lesions - work up including No hypermetabolism on PET, Enhanced US  showed likely benign hemangioma and biopsy was not recommended. 10/2022 - ultrasound -  continued slight decreased size of previously visualized right hepatic dome hyperechoic mass with contrast enhancement characteristics which again favored benign etiology such as hemangioma. Previously visualized left lobe hepatic mass is no longer conspicuous. Recommend to repeat enhanced ultrasound in 6 months - 1 year.   Leukopenia, unspecified type Assessment & Plan: Followed by hematology.    Sleep apnea, unspecified type Assessment & Plan: Continue cpap.       Allena Hamilton, MD "

## 2024-05-24 ENCOUNTER — Ambulatory Visit: Payer: Self-pay | Admitting: Internal Medicine

## 2024-05-28 ENCOUNTER — Encounter: Payer: Self-pay | Admitting: Internal Medicine

## 2024-05-28 NOTE — Assessment & Plan Note (Signed)
 Off statin due to elevated liver function tests.  Being evaluated by oncology.  Dr Babara - ordered f/u abdominal ultrasound - liver mass unchanged/smaller - favored benign lesion.  F/u Dr Babara - f/u enhanced ultrasound recommended one year.  F/u ultrasound - Continued slight decreased size of previously visualized right hepatic dome hyperechoic mass with contrast enhancement characteristics which again favored benign etiology such as hemangioma. Previously visualized left lobe hepatic mass is no longer conspicuous. Check liver panel.

## 2024-05-28 NOTE — Assessment & Plan Note (Signed)
 Continue cpap

## 2024-05-28 NOTE — Assessment & Plan Note (Signed)
 Low-carb diet and exercise.  Follow met b and A1c.

## 2024-05-28 NOTE — Assessment & Plan Note (Signed)
 Followed by hematology

## 2024-05-28 NOTE — Assessment & Plan Note (Signed)
 On Repatha .  Low cholesterol diet and exercise.  Follow lipid panel.  Lab Results  Component Value Date   CHOL 111 05/23/2024   HDL 51.10 05/23/2024   LDLCALC 46 05/23/2024   LDLDIRECT 53.0 07/18/2021   TRIG 72.0 05/23/2024   CHOLHDL 2 05/23/2024

## 2024-05-28 NOTE — Assessment & Plan Note (Signed)
 Saw Dr Babara 10/15/23 - f/u liver lesions - work up including No hypermetabolism on PET, Enhanced US  showed likely benign hemangioma and biopsy was not recommended. 10/2022 - ultrasound -  continued slight decreased size of previously visualized right hepatic dome hyperechoic mass with contrast enhancement characteristics which again favored benign etiology such as hemangioma. Previously visualized left lobe hepatic mass is no longer conspicuous. Recommend to repeat enhanced ultrasound in 6 months - 1 year.

## 2024-05-28 NOTE — Assessment & Plan Note (Signed)
 Has  been evaluated by GI - f/u elevated liver enzymes and recommended f/u colonoscopy.  Colonoscopy 06/2022 - unremarkable.  Recommended f/u in 5 years. Saw Dr Babara 01/2022 - ordered CT chest/abd/pelvis.  F/u colonoscopy 06/2022 - recommended f/u colonoscopy in 5 years.

## 2024-05-28 NOTE — Assessment & Plan Note (Signed)
 Had swelling with lisinopril . (Avoiding ace inhibitor and ARB given previous presumed allergic reaction). No sob. Will continue amlodipine . Check routine labs, including metabolic panel. Follow pressures.

## 2024-05-28 NOTE — Assessment & Plan Note (Addendum)
 Doing well on amlodipine , metoprolol .  Off statin due to liver enzyme elevation. No chest pain. No sob. Appears to be stable. Continue risk factor modification. Continue repatha . Walking daily. No chest pain.

## 2024-05-28 NOTE — Addendum Note (Signed)
 Addended by: GLENDIA ALLENA RAMAN on: 05/28/2024 04:00 PM   Modules accepted: Level of Service

## 2024-08-15 ENCOUNTER — Ambulatory Visit: Admitting: Student

## 2024-09-20 ENCOUNTER — Ambulatory Visit: Admitting: Internal Medicine

## 2024-10-14 ENCOUNTER — Other Ambulatory Visit

## 2024-10-21 ENCOUNTER — Ambulatory Visit: Admitting: Oncology
